# Patient Record
Sex: Male | Born: 1950
Health system: Southern US, Community
[De-identification: ages and names within clinical notes are randomized; demographics above are authoritative.]

## PROBLEM LIST (undated history)

## (undated) DIAGNOSIS — G4730 Sleep apnea, unspecified: Secondary | ICD-10-CM

## (undated) DIAGNOSIS — M0230 Reiter's disease, unspecified site: Secondary | ICD-10-CM

## (undated) DIAGNOSIS — G4700 Insomnia, unspecified: Secondary | ICD-10-CM

## (undated) DIAGNOSIS — N4611 Organic oligospermia: Secondary | ICD-10-CM

## (undated) DIAGNOSIS — G4733 Obstructive sleep apnea (adult) (pediatric): Secondary | ICD-10-CM

## (undated) DIAGNOSIS — T783XXA Angioneurotic edema, initial encounter: Secondary | ICD-10-CM

## (undated) DIAGNOSIS — L509 Urticaria, unspecified: Secondary | ICD-10-CM

## (undated) DIAGNOSIS — G540 Brachial plexus disorders: Secondary | ICD-10-CM

## (undated) DIAGNOSIS — R52 Pain, unspecified: Secondary | ICD-10-CM

## (undated) DIAGNOSIS — M1990 Unspecified osteoarthritis, unspecified site: Secondary | ICD-10-CM

## (undated) DIAGNOSIS — K219 Gastro-esophageal reflux disease without esophagitis: Secondary | ICD-10-CM

## (undated) DIAGNOSIS — T7840XA Allergy, unspecified, initial encounter: Secondary | ICD-10-CM

## (undated) DIAGNOSIS — N400 Enlarged prostate without lower urinary tract symptoms: Secondary | ICD-10-CM

## (undated) DIAGNOSIS — D126 Benign neoplasm of colon, unspecified: Secondary | ICD-10-CM

## (undated) DIAGNOSIS — M25461 Effusion, right knee: Secondary | ICD-10-CM

## (undated) DIAGNOSIS — L089 Local infection of the skin and subcutaneous tissue, unspecified: Secondary | ICD-10-CM

## (undated) DIAGNOSIS — Z9989 Dependence on other enabling machines and devices: Secondary | ICD-10-CM

## (undated) HISTORY — PX: SINOSCOPY: SHX187

## (undated) HISTORY — DX: Brachial plexus disorders: G54.0

## (undated) HISTORY — PX: ADENOIDECTOMY: SUR15

## (undated) HISTORY — DX: Benign neoplasm of colon, unspecified: D12.6

## (undated) HISTORY — DX: Gastro-esophageal reflux disease without esophagitis: K21.9

## (undated) HISTORY — DX: Urticaria, unspecified: L50.9

## (undated) HISTORY — DX: Allergy, unspecified, initial encounter: T78.40XA

## (undated) HISTORY — DX: Organic oligospermia: N46.11

## (undated) HISTORY — PX: TONSILLECTOMY: SUR1361

## (undated) HISTORY — PX: COLONOSCOPY: SHX174

## (undated) HISTORY — PX: FRACTURE SURGERY: SHX138

## (undated) HISTORY — DX: Angioneurotic edema, initial encounter: T78.3XXA

## (undated) HISTORY — DX: Insomnia, unspecified: G47.00

## (undated) HISTORY — PX: TOTAL SHOULDER REPLACEMENT: SUR1217

## (undated) HISTORY — PX: POLYPECTOMY: SHX149

## (undated) HISTORY — PX: JOINT REPLACEMENT: SHX530

---

## 1999-12-21 ENCOUNTER — Other Ambulatory Visit: Admission: RE | Admit: 1999-12-21 | Discharge: 1999-12-21 | Payer: Self-pay | Admitting: Orthopedic Surgery

## 2003-12-20 ENCOUNTER — Ambulatory Visit: Payer: Self-pay | Admitting: Internal Medicine

## 2004-04-18 ENCOUNTER — Ambulatory Visit: Payer: Self-pay | Admitting: Internal Medicine

## 2004-05-29 ENCOUNTER — Ambulatory Visit: Payer: Self-pay

## 2004-06-01 ENCOUNTER — Ambulatory Visit: Payer: Self-pay | Admitting: Internal Medicine

## 2005-01-09 ENCOUNTER — Ambulatory Visit: Payer: Self-pay | Admitting: Internal Medicine

## 2005-07-22 ENCOUNTER — Ambulatory Visit: Payer: Self-pay | Admitting: Internal Medicine

## 2006-01-07 HISTORY — PX: ULNAR NERVE REPAIR: SHX2594

## 2006-04-14 ENCOUNTER — Ambulatory Visit: Payer: Self-pay | Admitting: Internal Medicine

## 2006-04-14 LAB — CONVERTED CEMR LAB
ALT: 24 units/L (ref 0–40)
AST: 22 units/L (ref 0–37)
Albumin: 4 g/dL (ref 3.5–5.2)
Alkaline Phosphatase: 48 units/L (ref 39–117)
BUN: 29 mg/dL — ABNORMAL HIGH (ref 6–23)
Basophils Absolute: 0 10*3/uL (ref 0.0–0.1)
Basophils Relative: 0.3 % (ref 0.0–1.0)
Bilirubin Urine: NEGATIVE
Bilirubin, Direct: 0.1 mg/dL (ref 0.0–0.3)
CO2: 30 meq/L (ref 19–32)
Calcium: 9.4 mg/dL (ref 8.4–10.5)
Chloride: 108 meq/L (ref 96–112)
Cholesterol: 180 mg/dL (ref 0–200)
Creatinine, Ser: 1.3 mg/dL (ref 0.4–1.5)
Eosinophils Absolute: 0.3 10*3/uL (ref 0.0–0.6)
Eosinophils Relative: 5.6 % — ABNORMAL HIGH (ref 0.0–5.0)
GFR calc Af Amer: 73 mL/min
GFR calc non Af Amer: 60 mL/min
Glucose, Bld: 101 mg/dL — ABNORMAL HIGH (ref 70–99)
HCT: 44.1 % (ref 39.0–52.0)
HDL: 45.7 mg/dL (ref >39.0)
Hemoglobin, Urine: NEGATIVE
Hemoglobin: 15.1 g/dL (ref 13.0–17.0)
Ketones, ur: NEGATIVE mg/dL
LDL Cholesterol: 112 mg/dL — ABNORMAL HIGH (ref 0–99)
Leukocytes, UA: NEGATIVE
Lymphocytes Relative: 33.1 % (ref 12.0–46.0)
MCHC: 34.2 g/dL (ref 30.0–36.0)
MCV: 90.7 fL (ref 78.0–100.0)
Monocytes Absolute: 0.5 10*3/uL (ref 0.2–0.7)
Monocytes Relative: 10.1 % (ref 3.0–11.0)
Neutro Abs: 2.5 10*3/uL (ref 1.4–7.7)
Neutrophils Relative %: 50.9 % (ref 43.0–77.0)
Nitrite: NEGATIVE
PSA: 0.62 ng/mL (ref 0.10–4.00)
Platelets: 247 10*3/uL (ref 150–400)
Potassium: 4.7 meq/L (ref 3.5–5.1)
RBC: 4.86 M/uL (ref 4.22–5.81)
RDW: 11.9 % (ref 11.5–14.6)
Sodium: 142 meq/L (ref 135–145)
Specific Gravity, Urine: >=1.03 (ref 1.000–1.03)
TSH: 1.46 microintl units/mL (ref 0.35–5.50)
Total Bilirubin: 1 mg/dL (ref 0.3–1.2)
Total CHOL/HDL Ratio: 3.9
Total Protein, Urine: NEGATIVE mg/dL
Total Protein: 6.2 g/dL (ref 6.0–8.3)
Triglycerides: 113 mg/dL (ref 0–149)
Urine Glucose: NEGATIVE mg/dL
Urobilinogen, UA: 0.2 (ref 0.0–1.0)
VLDL: 23 mg/dL (ref 0–40)
WBC: 5 10*3/uL (ref 4.5–10.5)
pH: 5.5 (ref 5.0–8.0)

## 2006-04-16 ENCOUNTER — Ambulatory Visit: Payer: Self-pay | Admitting: Internal Medicine

## 2007-03-02 ENCOUNTER — Telehealth: Payer: Self-pay | Admitting: Internal Medicine

## 2007-05-14 ENCOUNTER — Telehealth: Payer: Self-pay | Admitting: Internal Medicine

## 2007-05-30 ENCOUNTER — Encounter: Payer: Self-pay | Admitting: *Deleted

## 2007-05-30 DIAGNOSIS — I499 Cardiac arrhythmia, unspecified: Secondary | ICD-10-CM | POA: Insufficient documentation

## 2007-05-30 DIAGNOSIS — D126 Benign neoplasm of colon, unspecified: Secondary | ICD-10-CM | POA: Insufficient documentation

## 2007-05-30 DIAGNOSIS — Z87898 Personal history of other specified conditions: Secondary | ICD-10-CM | POA: Insufficient documentation

## 2007-05-30 DIAGNOSIS — G4700 Insomnia, unspecified: Secondary | ICD-10-CM | POA: Insufficient documentation

## 2007-05-30 DIAGNOSIS — Z9889 Other specified postprocedural states: Secondary | ICD-10-CM | POA: Insufficient documentation

## 2007-07-28 ENCOUNTER — Telehealth: Payer: Self-pay | Admitting: Internal Medicine

## 2007-07-29 ENCOUNTER — Ambulatory Visit: Payer: Self-pay | Admitting: Internal Medicine

## 2007-07-29 DIAGNOSIS — R079 Chest pain, unspecified: Secondary | ICD-10-CM | POA: Insufficient documentation

## 2007-07-29 DIAGNOSIS — R0602 Shortness of breath: Secondary | ICD-10-CM | POA: Insufficient documentation

## 2007-07-30 ENCOUNTER — Ambulatory Visit: Payer: Self-pay | Admitting: Internal Medicine

## 2007-08-06 ENCOUNTER — Telehealth: Payer: Self-pay | Admitting: Internal Medicine

## 2007-08-10 ENCOUNTER — Telehealth: Payer: Self-pay | Admitting: Internal Medicine

## 2007-11-19 ENCOUNTER — Telehealth: Payer: Self-pay | Admitting: Internal Medicine

## 2007-11-19 DIAGNOSIS — R0989 Other specified symptoms and signs involving the circulatory and respiratory systems: Secondary | ICD-10-CM

## 2007-11-19 DIAGNOSIS — R0609 Other forms of dyspnea: Secondary | ICD-10-CM | POA: Insufficient documentation

## 2007-12-02 ENCOUNTER — Ambulatory Visit: Payer: Self-pay | Admitting: Pulmonary Disease

## 2007-12-04 ENCOUNTER — Telehealth: Payer: Self-pay | Admitting: Internal Medicine

## 2007-12-23 ENCOUNTER — Ambulatory Visit (HOSPITAL_BASED_OUTPATIENT_CLINIC_OR_DEPARTMENT_OTHER): Admission: RE | Admit: 2007-12-23 | Discharge: 2007-12-23 | Payer: Self-pay | Admitting: Pulmonary Disease

## 2007-12-23 ENCOUNTER — Encounter: Payer: Self-pay | Admitting: Pulmonary Disease

## 2008-01-19 ENCOUNTER — Ambulatory Visit: Payer: Self-pay | Admitting: Pulmonary Disease

## 2008-01-20 ENCOUNTER — Telehealth (INDEPENDENT_AMBULATORY_CARE_PROVIDER_SITE_OTHER): Payer: Self-pay | Admitting: *Deleted

## 2008-02-01 ENCOUNTER — Ambulatory Visit: Payer: Self-pay | Admitting: Pulmonary Disease

## 2008-02-01 DIAGNOSIS — G4733 Obstructive sleep apnea (adult) (pediatric): Secondary | ICD-10-CM | POA: Insufficient documentation

## 2008-02-04 ENCOUNTER — Telehealth (INDEPENDENT_AMBULATORY_CARE_PROVIDER_SITE_OTHER): Payer: Self-pay | Admitting: *Deleted

## 2008-02-15 ENCOUNTER — Ambulatory Visit: Payer: Self-pay | Admitting: Psychology

## 2008-02-17 ENCOUNTER — Encounter: Payer: Self-pay | Admitting: Internal Medicine

## 2008-02-24 ENCOUNTER — Encounter: Payer: Self-pay | Admitting: Pulmonary Disease

## 2008-03-07 ENCOUNTER — Ambulatory Visit: Payer: Self-pay | Admitting: Pulmonary Disease

## 2008-04-11 ENCOUNTER — Encounter: Payer: Self-pay | Admitting: Pulmonary Disease

## 2008-04-18 ENCOUNTER — Encounter: Payer: Self-pay | Admitting: Pulmonary Disease

## 2008-06-10 ENCOUNTER — Ambulatory Visit: Payer: Self-pay | Admitting: Psychology

## 2008-06-20 ENCOUNTER — Encounter: Payer: Self-pay | Admitting: Internal Medicine

## 2008-08-18 ENCOUNTER — Encounter: Payer: Self-pay | Admitting: Pulmonary Disease

## 2008-08-18 ENCOUNTER — Telehealth: Payer: Self-pay | Admitting: Pulmonary Disease

## 2008-08-29 ENCOUNTER — Ambulatory Visit: Payer: Self-pay | Admitting: Pulmonary Disease

## 2008-08-29 ENCOUNTER — Telehealth: Payer: Self-pay | Admitting: Internal Medicine

## 2008-09-09 ENCOUNTER — Encounter (INDEPENDENT_AMBULATORY_CARE_PROVIDER_SITE_OTHER): Payer: Self-pay | Admitting: *Deleted

## 2009-02-23 ENCOUNTER — Telehealth: Payer: Self-pay | Admitting: Internal Medicine

## 2009-02-24 ENCOUNTER — Encounter (INDEPENDENT_AMBULATORY_CARE_PROVIDER_SITE_OTHER): Payer: Self-pay | Admitting: *Deleted

## 2009-04-04 ENCOUNTER — Encounter (INDEPENDENT_AMBULATORY_CARE_PROVIDER_SITE_OTHER): Payer: Self-pay | Admitting: *Deleted

## 2009-04-12 ENCOUNTER — Ambulatory Visit: Payer: Self-pay | Admitting: Internal Medicine

## 2009-04-19 ENCOUNTER — Ambulatory Visit: Payer: Self-pay | Admitting: Internal Medicine

## 2009-04-20 ENCOUNTER — Encounter: Payer: Self-pay | Admitting: Internal Medicine

## 2009-06-09 ENCOUNTER — Telehealth: Payer: Self-pay | Admitting: Internal Medicine

## 2009-12-04 ENCOUNTER — Telehealth: Payer: Self-pay | Admitting: Internal Medicine

## 2009-12-13 ENCOUNTER — Encounter (INDEPENDENT_AMBULATORY_CARE_PROVIDER_SITE_OTHER): Payer: Self-pay | Admitting: *Deleted

## 2009-12-13 ENCOUNTER — Telehealth: Payer: Self-pay | Admitting: Internal Medicine

## 2009-12-28 ENCOUNTER — Encounter: Payer: Self-pay | Admitting: Internal Medicine

## 2010-02-04 LAB — CONVERTED CEMR LAB
ALT: 26 units/L (ref 0–53)
AST: 21 units/L (ref 0–37)
Albumin: 4.3 g/dL (ref 3.5–5.2)
Alkaline Phosphatase: 54 units/L (ref 39–117)
BUN: 23 mg/dL (ref 6–23)
Basophils Absolute: 0 10*3/uL (ref 0.0–0.1)
Basophils Relative: 0.1 % (ref 0.0–3.0)
Bilirubin, Direct: 0.1 mg/dL (ref 0.0–0.3)
CK-MB: 2.6 ng/mL (ref 0.3–4.0)
CO2: 29 meq/L (ref 19–32)
Calcium: 9.3 mg/dL (ref 8.4–10.5)
Chloride: 105 meq/L (ref 96–112)
Cholesterol: 191 mg/dL (ref 0–200)
Creatinine, Ser: 1.3 mg/dL (ref 0.4–1.5)
Eosinophils Absolute: 0.3 10*3/uL (ref 0.0–0.7)
Eosinophils Relative: 4 % (ref 0.0–5.0)
GFR calc Af Amer: 73 mL/min
GFR calc non Af Amer: 60 mL/min
Glucose, Bld: 97 mg/dL (ref 70–99)
HCT: 45.4 % (ref 39.0–52.0)
HDL: 49.3 mg/dL (ref >39.0)
Hemoglobin: 15.9 g/dL (ref 13.0–17.0)
LDL Cholesterol: 104 mg/dL — ABNORMAL HIGH (ref 0–99)
Lymphocytes Relative: 28 % (ref 12.0–46.0)
MCHC: 35.1 g/dL (ref 30.0–36.0)
MCV: 90.4 fL (ref 78.0–100.0)
Monocytes Absolute: 0.7 10*3/uL (ref 0.1–1.0)
Monocytes Relative: 10.5 % (ref 3.0–12.0)
Neutro Abs: 3.5 10*3/uL (ref 1.4–7.7)
Neutrophils Relative %: 57.4 % (ref 43.0–77.0)
Platelets: 236 10*3/uL (ref 150–400)
Potassium: 4.4 meq/L (ref 3.5–5.1)
RBC: 5.02 M/uL (ref 4.22–5.81)
RDW: 12.4 % (ref 11.5–14.6)
Sodium: 141 meq/L (ref 135–145)
TSH: 1.17 microintl units/mL (ref 0.35–5.50)
Total Bilirubin: 0.9 mg/dL (ref 0.3–1.2)
Total CHOL/HDL Ratio: 3.9
Total Protein: 6.9 g/dL (ref 6.0–8.3)
Triglycerides: 190 mg/dL — ABNORMAL HIGH (ref 0–149)
VLDL: 38 mg/dL (ref 0–40)
WBC: 6.3 10*3/uL (ref 4.5–10.5)

## 2010-02-06 NOTE — Progress Notes (Signed)
Summary: REFILL AMBIEN  Phone Note Call from Patient Call back at Home Phone [phone]   Caller: Patient/[phone] call Summary of Call:  per pt call returing  the nurse call reguarding his refill on ambein Initial call taken by: Shelbie Proctor,  May 14, 2007 9:51 AM  Follow-up for Phone Call        Pharm did not get refill's on 2/09 called in again Follow-up by: Lamar Sprinkles,  May 15, 2007 5:51 PM

## 2010-02-06 NOTE — Progress Notes (Signed)
Summary: RF  Phone Note Refill Request   Refills Requested: Medication #1:  ZOLPIDEM TARTRATE 10 MG  TABS 1/2 or 1 by mouth at hs prn. Initial call taken by: Lamar Sprinkles,  December 04, 2007 3:27 PM  Follow-up for Phone Call        [facility] 6 ref Follow-up by: Tresa Garter MD,  December 04, 2007 4:11 PM      Prescriptions: ZOLPIDEM TARTRATE 10 MG  TABS (ZOLPIDEM TARTRATE) 1/2 or 1 by mouth at hs prn  #30 x 6   Entered by:   Lamar Sprinkles   Authorized by:   Tresa Garter MD   Signed by:   Lamar Sprinkles on 12/04/2007   Method used:   Telephoned to ...       [facility] [address] [address] [phone]* (retail)       [address]       [place], [place]  [address]       Ph: [phone]       Fax: [phone]   RxID:   [id number]

## 2010-02-06 NOTE — Miscellaneous (Signed)
Summary: flomax  Clinical Lists Changes  Medications: Rx of FLOMAX 0.4 MG CP24 (TAMSULOSIN HCL) Take 1 tablet by mouth once a day;  #90 x 2;  Signed;  Entered by: Tora Perches;  Authorized by: Tresa Garter MD;  Method used: Electronically to [facility] [phone]*, [address] [place], [place], [place]  [address], Ph: [phone], Fax: [phone]    Prescriptions: FLOMAX 0.4 MG CP24 (TAMSULOSIN HCL) Take 1 tablet by mouth once a day  #90 x 2   Entered by:   Tora Perches   Authorized by:   Tresa Garter MD   Signed by:   Tora Perches on 02/17/2008   Method used:   Electronically to        [facility] [phone]* (retail)       [address]       [place], [place]  [address]       Ph: [phone]       Fax: [phone]   RxID:   [id number]

## 2010-02-06 NOTE — Miscellaneous (Signed)
Summary: poor compliance, mask leaks with auto  Clinical Lists Changes  Orders: Added new Referral order of DME Referral (DME) - Signed auto shows poor compliance, mask leaks.   Will work on these, and repeat auto.

## 2010-02-06 NOTE — Progress Notes (Signed)
Summary: Rx requests  Phone Note Call from Patient Call back at [phone]   Caller: Spouse Chip Boer) Reason for Call: Refill Medication Summary of Call: Patient's wife came into the office. Her husband is now getting his prescriptions from a mail order company [employer]) and she needs the perscriptions written out so she can send them in with the proper form and payments. This is for all current medications. Initial call taken by: Irma Newness,  August 29, 2008 11:45 AM  Follow-up for Phone Call        [facility] for refills on all meds?  Follow-up by: Lamar Sprinkles,  August 30, 2008 10:09 AM  Additional Follow-up for Phone Call Additional follow up Details #1::        OK 12 months on all. Thx Additional Follow-up by: Tresa Garter MD,  August 30, 2008 1:13 PM    Additional Follow-up for Phone Call Additional follow up Details #2::    Rx's ready, left mess to call office back to find out if rx's should be mailed or if pt wants to pick up. Credit card area is not signed. Form and rx's on triage A cart....................................Marland KitchenLamar Sprinkles, CMA  August 30, 2008 5:03 PM   Additional Follow-up for Phone Call Additional follow up Details #3:: Details for Additional Follow-up Action Taken: Wife to come pick up paper work. Additional Follow-up by: Beola Cord, CMA,  August 31, 2008 11:58 AM  Prescriptions: TRAZODONE HCL 50 MG  TABS (TRAZODONE HCL) At bedtime  #90 x 3   Entered by:   Orlan Leavens   Authorized by:   Tresa Garter MD   Signed by:   Orlan Leavens on 08/30/2008   Method used:   Print then Give to Patient   RxID:   [id number] ZOLPIDEM TARTRATE 10 MG  TABS (ZOLPIDEM TARTRATE) 1/2 or 1 by mouth at hs prn  #90 x 3   Entered by:   Orlan Leavens   Authorized by:   Tresa Garter MD   Signed by:   Orlan Leavens on 08/30/2008   Method used:   Print then Give to Patient   RxID:   [id number] FLOMAX 0.4 MG CP24 (TAMSULOSIN HCL) Take 1 tablet by mouth  once a day  #90 x 3   Entered by:   Orlan Leavens   Authorized by:   Tresa Garter MD   Signed by:   Orlan Leavens on 08/30/2008   Method used:   Print then Give to Patient   RxID:   [id number] AVODART 0.5 MG CAPS (DUTASTERIDE) Take 1 tablet by mouth once a day  #90 x 3   Entered by:   Orlan Leavens   Authorized by:   Tresa Garter MD   Signed by:   Orlan Leavens on 08/30/2008   Method used:   Print then Give to Patient   RxID:   [id number]

## 2010-02-06 NOTE — Progress Notes (Signed)
Summary: Ambien  Medications Added AVODART 0.5 MG CAPS (DUTASTERIDE)  FLOMAX 0.4 MG CP24 (TAMSULOSIN HCL)  ZOLPIDEM TARTRATE 10 MG  TABS (ZOLPIDEM TARTRATE) 1/2 or 1 by mouth at hs prn AMBIEN CR 12.5 MG  TBCR (ZOLPIDEM TARTRATE) 1 at bedtime as needed       Phone Note Call from Patient   Caller: [phone] Wife Ezequiel Essex OR wife [phone] Summary of Call: Patient is requesting new rx for generic ambien. He is currently taking Ambien CR Initial call taken by: Lamar Sprinkles,  March 02, 2007 3:12 PM  Follow-up for Phone Call        OK Follow-up by: Tresa Garter MD,  March 02, 2007 5:46 PM  Additional Follow-up for Phone Call Additional follow up Details #1::        SORRY, i got that backward, pt wants the CR b/c he wakes after 4 hours. OK to call in? Additional Follow-up by: Lamar Sprinkles,  March 02, 2007 6:20 PM    Additional Follow-up for Phone Call Additional follow up Details #2::    Ok Ambien CR 12.5 mg #30  6 REF Follow-up by: Tresa Garter MD,  March 03, 2007 1:27 PM  New/Updated Medications: AVODART 0.5 MG CAPS (DUTASTERIDE)  FLOMAX 0.4 MG CP24 (TAMSULOSIN HCL)  ZOLPIDEM TARTRATE 10 MG  TABS (ZOLPIDEM TARTRATE) 1/2 or 1 by mouth at hs prn AMBIEN CR 12.5 MG  TBCR (ZOLPIDEM TARTRATE) 1 at bedtime as needed   Prescriptions: AMBIEN CR 12.5 MG  TBCR (ZOLPIDEM TARTRATE) 1 at bedtime as needed  #30 x 6   Entered by:   Lamar Sprinkles   Authorized by:   Tresa Garter MD   Signed by:   Lamar Sprinkles on 03/03/2007   Method used:   Telephoned to ...       [facility] [address] [phone]*       [phone] [address]       [place], [place]  [address]       Ph: [phone]       Fax: [phone]   RxID:   [id number] ZOLPIDEM TARTRATE 10 MG  TABS (ZOLPIDEM TARTRATE) 1/2 or 1 by mouth at hs prn  #30 x 6   Entered and Authorized by:   Tresa Garter MD   Signed by:   Tresa Garter MD on 03/02/2007   Method used:    Print then Give to Patient   RxID:   [id number]

## 2010-02-06 NOTE — Miscellaneous (Signed)
Summary: patient returned cpap machine  Clinical Lists Changes  pt returned his cpap machine to dme, and did not want to use.  will call pt and see if he would like to come in and discuss other possible treatment options.

## 2010-02-06 NOTE — Progress Notes (Signed)
Summary: MED REFILL  Phone Note Call from Patient Call back at [phone]   Reason for Call: Talk to Nurse Summary of Call: NEEDS REFILL ON AVODART AND TRAMSULOSIN, PLEASE CALL INTO [facility] [address]..... NEEDS 1 MTH  WORTH TO LAST TILL APPT, GOING OUT OF TOWN NEED BY TOMORROW Initial call taken by: Migdalia Dk,  December 04, 2009 2:03 PM  Follow-up for Phone Call        Last office visit was 07/2007 - no pending apts Follow-up by: Lamar Sprinkles, CMA,  December 04, 2009 5:41 PM  Additional Follow-up for Phone Call Additional follow up Details #1::        ok to fill all Thank you!  Additional Follow-up by: Tresa Garter MD,  December 04, 2009 6:05 PM    Additional Follow-up for Phone Call Additional follow up Details #2::    Pt informed  Follow-up by: Lamar Sprinkles, CMA,  December 04, 2009 6:50 PM  Prescriptions: FLOMAX 0.4 MG CP24 (TAMSULOSIN HCL) Take 1 tablet by mouth once a day  #90 x 0   Entered by:   Lamar Sprinkles, CMA   Authorized by:   Tresa Garter MD   Signed by:   Lamar Sprinkles, CMA on 12/04/2009   Method used:   Electronically to        [facility] [phone]* (retail)       [address]       [place], [place]  [address]       Ph: [phone]       Fax: [phone]   RxID:   [id number] AVODART 0.5 MG CAPS (DUTASTERIDE) Take 1 tablet by mouth once a day  #90 x 0   Entered by:   Lamar Sprinkles, CMA   Authorized by:   Tresa Garter MD   Signed by:   Lamar Sprinkles, CMA on 12/04/2009   Method used:   Electronically to        [facility] [phone]* (retail)       [address]       [place], [place]  [address]       Ph: [phone]       Fax: [phone]   RxID:   [id number]

## 2010-02-06 NOTE — Assessment & Plan Note (Signed)
Summary: DIZZINESS/CHEST PAIN--$50-STC   Vital Signs:  Patient Profile:   60 Years Old Male Weight:      244 pounds Temp:     98.4 degrees F oral Pulse rate:   81 / minute BP sitting:   135 / 80  (left arm)  Vitals Entered By: Tora Perches (July 29, 2007 3:56 PM)                 Chief Complaint:  Multiple medical problems or concerns.  History of Present Illness: C/o CP first time pain in chest behind sternum heavy x 2 h on Thur,. Next episode on the plane. On Sun had a lightheaded spell when was  cycling  8 mi x 10 -15 min. Stressed out. Shoulders and back were achy while riding a bike. Today went to a spin cycling class: he gave it an 80% effort. There was no CP or unusual SOB.     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         As above   Physical Exam  General:     NAD Head:     Normocephalic and atraumatic without obvious abnormalities. No apparent alopecia or balding. Nose:     External nasal examination shows no deformity or inflammation. Nasal mucosa are pink and moist without lesions or exudates. Mouth:     Oral mucosa and oropharynx without lesions or exudates.  Teeth in good repair. Neck:     No deformities, masses, or  tenderness noted. Lungs:     Normal respiratory effort, chest expands symmetrically. Lungs are clear to auscultation, no crackles or wheezes. Heart:     Normal rate and regular rhythm. S1 and S2 normal without gallop, murmur, click, rub or other extra sounds. Abdomen:     Bowel sounds positive,abdomen soft and non-tender without masses, organomegaly or hernias noted. Msk:     No deformity or scoliosis noted of thoracic or lumbar spine.   Pulses:     R and L carotid,radial,femoral,dorsalis pedis and posterior tibial pulses are full and equal bilaterally Extremities:     No clubbing, cyanosis, edema, or deformity noted with normal full range of motion of all joints.   Skin:     Intact without suspicious lesions or rashes Psych:     Cognition and judgment appear intact. Alert and cooperative with normal attention span and concentration. No apparent delusions, illusions, hallucinations    Impression & Recommendations:  Problem # 1:  CHEST PAIN, UNSPECIFIED (  ICD-786.50) Assessment: New R/o PE, CAD Orders: EKG w/ Interpretation (93000) Radiology Referral (Radiology) Cardiology Referral (Cardiology) TLB-BMP (Basic Metabolic Panel-BMET) (80048-METABOL) TLB-CBC Platelet - w/Differential (85025-CBCD) TLB-Hepatic/Liver Function Pnl (80076-HEPATIC) TLB-TSH (Thyroid Stimulating Hormone) (84443-TSH) TLB-Lipid Panel (80061-LIPID) TLB-CK-MB (Creatine Kinase MB) (82553-CKMB)   Problem # 2:  DYSPNEA (ICD-786.05) related to #1 Assessment: New To ER if problems Orders: Radiology Referral (Radiology) Cardiology Referral (Cardiology)   Problem # 3:  Probable GERD/ esoph. spasm Assessment: New Given Rx  Complete Medication List: 1)  Avodart 0.5 Mg Caps (Dutasteride) 2)  Flomax 0.4 Mg Cp24 (Tamsulosin hcl) .... Take 1 tablet by mouth once a day 3)  Zolpidem Tartrate 10 Mg Tabs (Zolpidem tartrate) .... 1/2 or 1 by mouth at hs prn 4)  Ambien Cr 12.5 Mg Tbcr (Zolpidem tartrate) .[name].. 1 at  bedtime as needed 5)  Aspirin 81 Mg Tbec (Aspirin) .... One by mouth every day   Patient Instructions: 1)  Please schedule a follow-up appointment in 2 weeks. 2)  Aciphex one a day 3)  To ER if problems   ]

## 2010-02-06 NOTE — Letter (Signed)
Summary: [facility] Instructions  [facility]  [address] [place], [place] [address]   Phone: [phone]  Fax: [phone]       Dak TINKEY    06/23/1950    MRN: [id number]        Procedure Day Dorna Bloom: Wednesday 04/19/2009     Arrival Time: 8:00 am      Procedure Time: 9:00 am     Location of Procedure:                    _x _  [facility] (4th Floor)                        PREPARATION FOR COLONOSCOPY WITH MOVIPREP   Starting 5 days prior to your procedure Friday 4/8 do not eat nuts, seeds, popcorn, corn, beans, peas,  salads, or any raw vegetables.  Do not take any fiber supplements (e.g. Metamucil, Citrucel, and Benefiber).  THE DAY BEFORE YOUR PROCEDURE         DATE: Tuesday 4/12  1.  Drink clear liquids the entire day-NO SOLID FOOD  2.  Do not drink anything colored red or purple.  Avoid juices with pulp.  No orange juice.  3.  Drink at least 64 oz. (8 glasses) of fluid/clear liquids during the day to prevent dehydration and help the prep work efficiently.  CLEAR LIQUIDS INCLUDE: Water Jello Ice Popsicles Tea (sugar ok, no milk/cream) Powdered fruit flavored drinks Coffee (sugar ok, no milk/cream) Gatorade Juice: apple, white grape, white cranberry  Lemonade Clear bullion, consomm, broth Carbonated beverages (any kind) Strained chicken noodle soup Hard Candy                             4.  In the morning, mix first dose of MoviPrep solution:    Empty 1 Pouch A and 1 Pouch B into the disposable container    Add lukewarm drinking water to the top line of the container. Mix to dissolve    Refrigerate (mixed solution should be used within 24 hrs)  5.  Begin drinking the prep at 5:00 p.m. The MoviPrep container is divided by 4 marks.   Every 15 minutes drink the solution down to the next mark (approximately 8 oz) until the full liter is complete.   6.  Follow completed prep with 16 oz of clear liquid of your choice (Nothing  red or purple).  Continue to drink clear liquids until bedtime.  7.  Before going to bed, mix second dose of MoviPrep solution:    Empty 1 Pouch A and 1 Pouch B into the disposable container    Add lukewarm drinking water to the top line of the container. Mix to dissolve    Refrigerate  THE DAY OF YOUR PROCEDURE      DATE: Wednesday 4/13  Beginning at 4:00 a.m. (5 hours before procedure):         1. Every 15 minutes, drink the solution down to the next mark (approx 8 oz) until the full liter is complete.  2. Follow completed prep with 16 oz. of clear liquid of your choice.    3. You may drink clear liquids until 7:00 am  (2 HOURS BEFORE PROCEDURE).   MEDICATION INSTRUCTIONS  Unless otherwise instructed, you should take regular prescription medications with a small sip of water   as early as possible the morning of  your procedure.          OTHER INSTRUCTIONS  You will need a responsible adult at least 62 years of age to accompany you and drive you home.   This person must remain in the waiting room during your procedure.  Wear loose fitting clothing that is easily removed.  Leave jewelry and other valuables at home.  However, you may wish to bring a book to read or  an iPod/MP3 player to listen to music as you wait for your procedure to start.  Remove all body piercing jewelry and leave at home.  Total time from sign-in until discharge is approximately 2-3 hours.  You should go home directly after your procedure and rest.  You can resume normal activities the  day after your procedure.  The day of your procedure you should not:   Drive   Make legal decisions   Operate machinery   Drink alcohol   Return to work  You will receive specific instructions about eating, activities and medications before you leave.    The above instructions have been reviewed and explained to me by  Wyona Almas RN  April 12, 2009 4:58 PM     I fully understand and can  verbalize these instructions _____________________________ Date _________

## 2010-02-06 NOTE — Progress Notes (Signed)
Summary: REFERRAL  Phone Note Call from Patient   Caller: [phone] Summary of Call: Patient is requesting referral for a sleep study. Says he snores.  Initial call taken by: Lamar Sprinkles,  November 19, 2007 3:54 PM  Follow-up for Phone Call        OK Follow-up by: Tresa Garter MD,  November 19, 2007 11:59 PM  New Problems: SNORING (ICD-786.09)   New Problems: SNORING (ICD-786.09)

## 2010-02-06 NOTE — Letter (Signed)
Summary: External Correspondence  External Correspondence   Imported By: Valinda Hoar 04/11/2008 16:11:54  _____________________________________________________________________  External Attachment:    Type:   Image     Comment:   External Document

## 2010-02-06 NOTE — Progress Notes (Signed)
Summary: Needs OV with PCP  ---- Converted from flag ---- ---- 12/13/2009 10:54 AM, Verdell Face wrote:  mailed letter to pt..  ---- 11/24/2009 5:04 PM, Lanier Prude, CMA(AAMA) wrote: Please sched pt OV with PCP if still desires to do so.  Last seen 2009    Thanks! ------------------------------

## 2010-02-06 NOTE — Progress Notes (Signed)
Summary: pt returned CPAP to [facility]  Phone Note From Other Clinic Call back at [phone]   Caller: Buford Dresser with [facility] Summary of Call: [phone] Buford Dresser with [facility] called and stated that pt returned his CPAP today. He stated that he was not using CPAP and insurance company has applied everything to his deductible. Mardelle Matte has download CPAP and pt has NOT been using machine. Just wanted you to be aware.   Initial call taken by: Alfonso Ramus,  August 18, 2008 9:14 AM  Follow-up for Phone Call        see if pt would like to come and discuss other possible treatment options with me for his sleep apnea. Follow-up by: Barbaraann Share MD,  August 18, 2008 9:55 AM  Additional Follow-up for Phone Call Additional follow up Details #1::        [facility] for pt to return my call. 08/18/08 at 10:57 am.[facility] for pt to return my call on 08/22/08 at 4:31 pm. Alfonso Ramus  August 22, 2008 4:35 PM  Pt returned my call and appt was scheduled for 08/29/08 at 9:45. Rhonda Cobb  August 23, 2008 9:29 AM

## 2010-02-06 NOTE — Miscellaneous (Signed)
Summary: [facility] Order for CPAP Equipment/[facility]  [facility] Order for CPAP Equipment/[facility]   Imported By: Esmeralda Links D'jimraou 02/26/2008 13:53:01  _____________________________________________________________________  External Attachment:    Type:   Image     Comment:   External Document

## 2010-02-06 NOTE — Progress Notes (Signed)
Summary: Stress Test  Phone Note Call from Patient Call back at Home Phone [phone]   Caller: [phone] Summary of Call: Pt says that since CT was clear, pt has gone back to 1 hr bike cardio daily, rode 100 k at 135 hr this weekend. Pt does not think he needs stress test. Says he did not know about it in the first place.  Initial call taken by: Lamar Sprinkles,  August 10, 2007 3:45 PM  Follow-up for Phone Call        [facility] not to reschedule the stress test then Follow-up by: Tresa Garter MD,  August 10, 2007 5:12 PM  Additional Follow-up for Phone Call Additional follow up Details #1::        Pt informed  Additional Follow-up by: Lamar Sprinkles,  August 10, 2007 5:23 PM

## 2010-02-06 NOTE — Procedures (Signed)
Summary: Colonoscopy  Patient: Braulio Kiedrowski Note: All result statuses are Final unless otherwise noted.  Tests: (1) Colonoscopy (COL)   COL Colonoscopy           DONE     [facility]     [address]     [place], [place]  [address]           COLONOSCOPY PROCEDURE REPORT           PATIENT:  Faysal, Fenoglio  MR#:  [id number]     BIRTHDATE:  [birth year], 58 yrs. old  GENDER:  male     ENDOSCOPIST:  Wilhemina Bonito. Eda Keys, MD     REF. BY:  Surveillance Program Recall,     PROCEDURE DATE:  04/19/2009     PROCEDURE:  Colonoscopy with snare polypectomy x 4     ASA CLASS:  Class II     INDICATIONS:  history of hyperplastic polyps ; INDEX 09-2003 (mult.     transverse)     MEDICATIONS:   Fentanyl 100 mcg IV, Versed 10 mg IV, Benadryl 25     mg IV           DESCRIPTION OF PROCEDURE:   After the risks benefits and     alternatives of the procedure were thoroughly explained, informed     consent was obtained.  Digital rectal exam was performed and     revealed no abnormalities.   The LB [id number] endoscope     was introduced through the anus and advanced to the cecum, which     was identified by both the appendix and ileocecal valve, without     limitations. Time to cecum = 6:28 min. The quality of the prep was     good, using MoviPrep.  The instrument was then slowly withdrawn     (time = 15:50min) as the colon was fully examined.     <<PROCEDUREIMAGES>>           FINDINGS:  Four polyps were found: 2mm cecal and 10mm,4mm,5mm in     the distal transverse colon. Polyps were snared without cautery.     Retrieval was successful.   This was otherwise a normal     examination of the colon.   Retroflexed views in the rectum     revealed no abnormalities.    The scope was then withdrawn from     the patient and the procedure completed.           COMPLICATIONS:  None     ENDOSCOPIC IMPRESSION:     1) Four polyps - removed     2) Otherwise normal examination        RECOMMENDATIONS:     1) Follow up colonoscopy in 3 years if 3 or more polyps     adenomatous; otherwise 5 years           ______________________________     Wilhemina Bonito. Eda Keys, MD           CC:  Linda Hedges. Plotnikov, MD; The Patient           n.     eSIGNED:   Wilhemina Bonito. Eda Keys at 04/19/2009 10:05 AM           Shatto, Keith[place], Dileonardo [phone]  Note: An exclamation mark (!) indicates a result that was not dispersed into the flowsheet. Document Creation Date: 04/19/2009 10:06 AM _______________________________________________________________________  (1) Order result status: Final Collection or observation date-time:  04/19/2009 09:53 Requested date-time:  Receipt date-time:  Reported date-time:  Referring Physician:   Ordering Physician: Fransico Setters [id number]) Specimen Source:  Source: Launa Grill Order Number: [id number] Lab site:   Appended Document: Colonoscopy     Procedures Next Due Date:    Colonoscopy: 04/2012

## 2010-02-06 NOTE — Assessment & Plan Note (Signed)
Summary: rov sleep study results/mbw   Referred by:  Plotnikov PCP:  Tresa Garter MD  Chief Complaint:  Chris Obrien here to go over sleep study.  History of Present Illness: the Chris Obrien comes in today for f/u after his recent sleep study.  He was found to have moderate to severe osa with an AHI of 35/hr, and a RDI of 51/hr.  He had oxygen desaturation as low as 83%.  I have gone over the study in detail with him, and answered all of his questions.     Prior Medications Reviewed Using: Patient Recall  Prior Medication List:  AVODART 0.5 MG CAPS (DUTASTERIDE) Take 1 tablet by mouth once a day FLOMAX 0.4 MG CP24 (TAMSULOSIN HCL) Take 1 tablet by mouth once a day ZOLPIDEM TARTRATE 10 MG  TABS (ZOLPIDEM TARTRATE) 1/2 or 1 by mouth at hs prn   Current Allergies: No known allergies       Vital Signs:  Patient Profile:   60 Years Old Male Weight:      250.8 pounds O2 Sat:      96 % O2 treatment:    Room Air Temp:     98.0 degrees F oral Pulse rate:   81 / minute BP sitting:   128 / 82  (left arm) Cuff size:   regular  Vitals Entered By: Arman Filter LPN (February 01, 2008 11:57 AM)                 Physical Exam  General:     ow male in nad      Impression & Recommendations:  Problem # 1:  OBSTRUCTIVE SLEEP APNEA (ICD-327.23) the Chris Obrien has moderate to severe osa by his sleep study.  Given his symptoms and the severity of his osa, I have recommended a trial of cpap while working on weight loss.  He is agreeable to trying this.   Patient Instructions: 1)  will try on cpap 2)  work on weight loss 3)  f/u with me in 4wks., but call if tolerance issues.

## 2010-02-06 NOTE — Progress Notes (Signed)
Summary: Stress Test  Phone Note From Other Clinic   Summary of Call: [facility] ext [phone] called, pt did not show for stress test today. Pt told them he was not sure if Dr wanted pt to have this done still. Need to inform heartcare.  Initial call taken by: Lamar Sprinkles,  August 06, 2007 3:28 PM  Follow-up for Phone Call        Yes, I wanted him to have the test Follow-up by: Tresa Garter MD,  August 10, 2007 1:15 PM  Additional Follow-up for Phone Call Additional follow up Details #1::        heartcare informed, lf mess for pt Additional Follow-up by: Lamar Sprinkles,  August 10, 2007 2:46 PM

## 2010-02-06 NOTE — Progress Notes (Signed)
Summary: dixxiness  Phone Note Call from Patient   Caller: Patient Summary of Call: Patient called c/o dizzy spell on 07/26/07, he had some pin pick type of pain under his arm/chest (lasting3-4sec) he states not other symptoms. Appt schedule for 07/29/07 and told to go to UC/ER is it gets worse. Initial call taken by: Rock Nephew CMA,  July 28, 2007 10:26 AM

## 2010-02-06 NOTE — Letter (Signed)
Summary: [facility]  [facility]   Imported By: Sherian Rein 07/05/2008 10:50:05  _____________________________________________________________________  External Attachment:    Type:   Image     Comment:   External Document

## 2010-02-06 NOTE — Letter (Signed)
Summary: External Correspondence  External Correspondence   Imported By: Valinda Hoar 04/11/2008 16:14:27  _____________________________________________________________________  External Attachment:    Type:   Image     Comment:   External Document

## 2010-02-06 NOTE — Letter (Signed)
Summary: Recall Colonoscopy Letter  [facility]  [address] [place], [place] [address]   Phone: [phone]  Fax: [phone]      September 09, 2008 MRN: [id number]   Chris Obrien [address] [place], [place]  [address]   Dear [name],   According to your medical record, it is time for you to schedule a Colonoscopy. The American Cancer Society recommends this procedure as a method to detect early colon cancer. Patients with a family history of colon cancer, or a personal history of colon polyps or inflammatory bowel disease are at increased risk.  This letter has beeen generated based on the recommendations made at the time of your procedure. If you feel that in your particular situation this may no longer apply, please contact our office.  Please call our office at [phone] to schedule this appointment or to update your records at your earliest convenience.  Thank you for cooperating with [place] to provide you with the very best care possible.   Sincerely,  Wilhemina Bonito. Marina Goodell, M.D.  [facility] [phone]

## 2010-02-06 NOTE — Progress Notes (Signed)
Summary: Schedule Colonoscopy  Phone Note Outgoing Call   Call placed by: Hortense Ramal CMA Duncan Dull),  February 23, 2009 3:27 PM Call placed to: Patient Summary of Call: Patient is due for a colonoscopy due to his previous history of hyperplastic colonic polyps. I attempted to contact patient, however he was unavailable. His wife states that she will have the patient call back to discuss. Initial call taken by: Hortense Ramal CMA Duncan Dull),  February 23, 2009 3:28 PM  Follow-up for Phone Call        Patient has scheduled a previsit and colonoscopy date and time. Follow-up by: Hortense Ramal CMA Duncan Dull),  February 28, 2009 10:05 AM

## 2010-02-06 NOTE — Assessment & Plan Note (Signed)
Summary: rov for osa   Referred by:  Plotnikov PCP:  Tresa Garter MD  Chief Complaint:  Pt is here for a routine f/u appt on his cpap machine.  Pt states he is using his cpap machine "almost every night. "  Approx 6 hours per night .  History of Present Illness: the pt comes in today for f/u after initiating cpap.  He has adjusted to the mask, and his wife has noted no further snoring.  He is sleeping a little better, but doesn't feel that he has gotten full therapeutic benefit from the device.  I have reminded him that his pressure has not been optimized yet.    Current Allergies: No known allergies   Review of Systems      See HPI  Vital Signs:  Patient Profile:   60 Years Old Male Weight:      247 pounds O2 Sat:      96 % O2 treatment:    Room Air Temp:     98.0 degrees F oral Pulse rate:   72 / minute BP sitting:   118 / 78  (left arm) Cuff size:   regular  Vitals Entered By: Arman Filter LPN (March 08, 8467 10:41 AM)             Comments Medications reviewed with patient Arman Filter LPN  March 07, 6293 10:41 AM     Physical Exam  General:     ow male in nad Nose:     no skin breakdown or pressure necrosis from the cpap mask   Impression & Recommendations:  Problem # 1:  OBSTRUCTIVE SLEEP APNEA (ICD-327.23) the pt is tolerating cpap well, but has not seen full benefit as of yet.  We need to convert his machine to the auto mode for 2 weeks to optimize his pressure for him.  I would expect him to do better once this is done.  I will let him know his optimal pressure.  I have also encouraged him to work on weight loss.  Patient Instructions: 1)  will put on auto mode to optimize your pressure.  Will let you know the results 2)  f/u with me in 6mos or sooner if problems.

## 2010-02-06 NOTE — Miscellaneous (Signed)
Summary: [facility] Previsit/prep  Clinical Lists Changes  Medications: Added new medication of MOVIPREP 100 GM  SOLR (PEG-KCL-NACL-NASULF-NA ASC-C) As per prep instructions. - Signed Rx of MOVIPREP 100 GM  SOLR (PEG-KCL-NACL-NASULF-NA ASC-C) As per prep instructions.;  #1 x 0;  Signed;  Entered by: Wyona Almas RN;  Authorized by: Hilarie Fredrickson MD;  Method used: Electronically to [facility]. [phone]*, [address], [place], [place], [place]  [address], Ph: [phone], Fax: [phone] Observations: Added new observation of NKA: T (04/12/2009 16:24)    Prescriptions: MOVIPREP 100 GM  SOLR (PEG-KCL-NACL-NASULF-NA ASC-C) As per prep instructions.  #1 x 0   Entered by:   Wyona Almas RN   Authorized by:   Hilarie Fredrickson MD   Signed by:   Wyona Almas RN on 04/12/2009   Method used:   Electronically to        [facility]. [phone]* (retail)       [address]       [place], [place]  [address]       Ph: [phone]       Fax: [phone]   RxID:   [id number]

## 2010-02-06 NOTE — Letter (Signed)
Summary: Patient Notice- Polyp Results  [facility]  [address] [place], [place] [address]   Phone: [phone]  Fax: [phone]        April 20, 2009 MRN: [id number]    Chris Obrien [address] [place], [place]  [address]    Dear [name],  I am pleased to inform you that the colon polyp(s) removed during your recent colonoscopy was (were) found to be benign (no cancer detected) upon pathologic examination.  I recommend you have a repeat colonoscopy examination in 3 years to look for recurrent polyps, as having colon polyps increases your risk for having recurrent polyps or even colon cancer in the future.  Should you develop new or worsening symptoms of abdominal pain, bowel habit changes or bleeding from the rectum or bowels, please schedule an evaluation with either your primary care physician or with me.  Additional information/recommendations:  __ No further action with gastroenterology is needed at this time. Please      follow-up with your primary care physician for your other healthcare      needs.  Please call us if you are having persistent problems or have questions about your condition that have not been fully answered at this time.  Sincerely,  Hilarie Fredrickson MD  This letter has been electronically signed by your physician.  Appended Document: Patient Notice- Polyp Results letter mailed 4.18.11

## 2010-02-06 NOTE — Letter (Signed)
Summary: Primary Care Appointment Letter  [facility]  [address] [place], [place] [address]   Phone: [phone]  Fax: [phone]    12/13/2009 MRN: [id number]  Clee Fetsch [address] [place], [place]  [address]  Dear Mr. Verrastro,   Your Primary Care Physician Tresa Garter MD has indicated that:    ____XX___it is time to schedule an appointment.    _______you missed your appointment on______ and need to call and          reschedule.    _______you need to have lab work done.    _______you need to schedule an appointment discuss lab or test results.    _______you need to call to reschedule your appointment that is                       scheduled on _________.     Please call our office as soon as possible. Our phone number is [phone] option #1. Please press option 1. Our office is open 8a-12noon and 1p-5p, Monday through Friday.     Thank you,    [facility]

## 2010-02-06 NOTE — Progress Notes (Signed)
Summary: pt needs ov to discuss sleep study results LMTCB  ---- Converted from flag ---- ---- 01/20/2008 10:46 AM, Arman Filter LPN wrote: pt needs ov with [facility] to discuss sleep study results. ------------------------------  LMOMTCBx1.   Aundra Millet Deyonna Fitzsimmons LPN  January 20, 2008 11:03 AM   pt is scheduled to see [facility] jan 25 at 12:00pm.  Arman Filter LPN  January 25, 2008 12:15 PM

## 2010-02-06 NOTE — Progress Notes (Signed)
Summary: c pap  Phone Note Call from Patient   Caller: Patient Call For: clance Summary of Call: pt have not heard about c pap machine that is suppose to be coming from [facility] Initial call taken by: Rickard Patience,  February 04, 2008 9:11 AM  Follow-up for Phone Call        Pt says he has not heard from [facility] regarding his new CPAP machine. Can we check on this and [please give the pt a call back at his cell number, [phone]. Thanks. Michel Bickers [facility]  February 04, 2008 9:44 AM    left message for pt that [facility] usually calls within a few days today only being the 3rd day i will call and have someone call him asap/spoke to [email or web address]  she will have someone call him [phone] pt aware

## 2010-02-06 NOTE — Letter (Signed)
Summary: Previsit letter  [facility]  [address] [place], [place] [address]   Phone: [phone]  Fax: [phone]       02/24/2009 MRN: [id number]  Chris Obrien [address] [place], [place]  [address]  Dear [name],  Welcome to the Gastroenterology Division at [facility].    You are scheduled to see a nurse for your pre-procedure visit on 04-05-09 at 8:00a.m. on the 3rd floor at [facility], [address]  We ask that you try to arrive at our office 15 minutes prior to your appointment time to allow for check-in.  Your nurse visit will consist of discussing your medical and surgical history, your immediate family medical history, and your medications.    Please bring a complete list of all your medications or, if you prefer, bring the medication bottles and we will list them.  We will need to be aware of both prescribed and over the counter drugs.  We will need to know exact dosage information as well.  If you are on blood thinners (Coumadin, Plavix, Aggrenox, Ticlid, etc.) please call our office today/prior to your appointment, as we need to consult with your physician about holding your medication.   Please be prepared to read and sign documents such as consent forms, a financial agreement, and acknowledgement forms.  If necessary, and with your consent, a friend or relative is welcome to sit-in on the nurse visit with you.  Please bring your insurance card so that we may make a copy of it.  If your insurance requires a referral to see a specialist, please bring your referral form from your primary care physician.  No co-pay is required for this nurse visit.     If you cannot keep your appointment, please call [phone] to cancel or reschedule prior to your appointment date.  This allows [place] the opportunity to schedule an appointment for another patient in need of care.    Thank you for choosing [facility] for your medical  needs.  We appreciate the opportunity to care for you.  Please visit [place] at our website  to learn more about our practice.                     Sincerely.                                                                                                                   The Gastroenterology Division

## 2010-02-06 NOTE — Assessment & Plan Note (Signed)
Summary: rov for osa with failed cpap treatment.   Copy to:  Plotnikov Primary Provider/Referring Provider:  Tresa Garter MD  CC:  Pt is here for a f/u appt.  Pt states he returned cpap machine back to DME company and would like to discuss other options to treat OSA.[name]  History of Present Illness: The pt comes in today for f/u of his severe osa.  He was unable to tolerate cpap, and returned the machine to the dme.  He comes in today for discussion of other options.  He feels that ambien at hs while he is trying to work on weight loss will be adequate, but I have explained to him that sedative hypnotics worsen osa.  He is having fragmented and nonrestorative sleep.  Medications Prior to Update: 1)  Avodart 0.5 Mg Caps (Dutasteride) .... Take 1 Tablet By Mouth Once A Day 2)  Flomax 0.4 Mg Cp24 (Tamsulosin Hcl) .... Take 1 Tablet By Mouth Once A Day 3)  Zolpidem Tartrate 10 Mg  Tabs (Zolpidem Tartrate) .... 1/2 or 1 By Mouth At [facility] Prn  Allergies (verified): No Known Drug Allergies  Review of Systems      See HPI  Vital Signs:  Patient profile:   60 year old male Height:      73 inches Weight:      248 pounds BMI:     32.84 O2 Sat:      96 % on Room air Temp:     98.4 degrees F oral Pulse rate:   80 / minute BP sitting:   128 / 80  (left arm) Cuff size:   large  Vitals Entered By: Arman Filter LPN (August 29, 2008 9:43 AM)  O2 Flow:  Room air CC: Pt is here for a f/u appt.  Pt states he returned cpap machine back to DME company and would like to discuss other options to treat OSA. Comments Medications reviewed with patient Arman Filter LPN  August 29, 2008 9:43 AM    Physical Exam  General:  ow male in nad Nose:  no skin breakdown or pressure necrosis from prior use of cpap mask   Impression & Recommendations:  Problem # 1:  OBSTRUCTIVE SLEEP APNEA (ICD-327.23)  The pt was unable to tolerate cpap, and recently turned the machine back in.  He continues to have  fragmented sleep, and is taking ambien to help with this.  I cautioned him about taking benzodiazepine like drugs with severe osa, and that they also have the disadvantage of building tolerance and addiction.  I would not give him this medication for his sleep given the severity of his untreated sleep apnea.  I would consider trazodone short term, and have also discussed with him behavioral modifications as well.  I have also discussed with him alternative treatments for his osa.  Surgery is not a good option due to his minimally abnormal anatomy.  I think aggressive weight loss combined with dental appliance would be very helpful.  I have given him a brochure about dental appliances, and he will call me if he would like to consider a referral.  I have also explained to him that his degree of sleep apnea will not be cured by a dental appliance alone, but can help considerably while he is trying to lose weight.  Medications Added to Medication List This Visit: 1)  Trazodone Hcl 50 Mg Tabs (Trazodone hcl) .... At bedtime  Other Orders: Est. Patient Level III ([address])  Patient  Instructions: 1)  will try trazodone 50mg  at bedtime in the place of ambien. 2)  work on weight loss 3)  consider dental appliance  Prescriptions: TRAZODONE HCL 50 MG  TABS (TRAZODONE HCL) At bedtime  #30 x 1   Entered and Authorized by:   Barbaraann Share MD   Signed by:   Barbaraann Share MD on 08/29/2008   Method used:   Print then Give to Patient   RxID:   [id number]

## 2010-02-06 NOTE — Assessment & Plan Note (Signed)
Summary: consult for osa   Referred by:  Plotnikov PCP:  Tresa Garter MD  Chief Complaint:  Sleep Consult.  History of Present Illness: the pt is a 60 y/o male who is being referred for possible sleep apnea.  He has been noted to have snoring and snorting according to his wife, as well as pauses in his breathing during sleep. He typically goes to bed between 8 and 10 PM, and has had long-standing difficulty with sleep onset insomnia for years. He has taken Ambien chronically at night for at least 5 years. The patient has at least 3-4 awakenings a night for unknown reasons. He does not feel that he has difficulties getting back to sleep, but is frustrated by the frequent awakenings. He denies any choking arousals or leg kicking during the night.  He typically starts his day at 6:30 AM, and feels that he is adequately rested upon arising. He has noted intermittent sleep pressure during the day with periods of inactivity, but doesn't believe that his alertness or concentration are being affected. He has no issues with sleepiness while driving. He does admit to taking "power naps" in the early evenings, but he has tried to avoid that of late. The patient states that his weight is up 10 pounds over the last 2 years. His Epworth score today is only 6.     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       Never Smoked       Regular exercise-yes   Risk Factors: Tobacco use:  never Exercise:  yes  Colonoscopy History:    Date of Last Colonoscopy:  10/06/2003   Review of Systems      See HPI   Vital Signs:  Patient Profile:   60 Years Old Male Weight:      250.25 pounds O2 Sat:      96 % O2 treatment:    Room Air Temp:     98.3 degrees F oral Pulse rate:   83 / minute BP sitting:   126 / 78  (left arm) Cuff size:   regular  Vitals Entered By: Cyndia Diver LPN (December 02, 2007 11:39 AM)             Is Patient Diabetic? No Comments Medications reviewed with patient Cyndia Diver LPN  December 02, 2007 11:39 AM      Physical Exam  General:     overweight male in no acute distress Eyes:     PERRLA and EOMI.   Nose:     mild  septal deviation to the right but otherwise patent Mouth:     mild elongation of the soft palate and uvula Neck:     no JVD, thyromegaly, or lymphadenopathy. Lungs:     clear to auscultation Heart:     regular rate and rhythm, no MRG Abdomen:     soft and nontender, bowel sounds present Extremities:     no significant edema, pulses intact distally Neurologic:     alert and oriented, moves all 4 extremities.      Impression & Recommendations:  Problem # 1:  SNORING (ICD-786.09) the patient has a history of snoring as well as pauses in his breathing during sleep according to his wife. She is very concerned about this, and has asked him to get this evaluated. The patient also is having frequent awakenings during the night of unknown etiology, and this certainly could be from obstructive sleep apnea. He denies any history consistent with RLS. At this point, if the patient wishes to get to the bottom of his awakenings, we should do a sleep study. This will put the issue to rest regarding sleep apnea, and also give reassurance to  his wife.  Problem # 2:  INSOMNIA (ICD-780.52) the patient describes sleep onset insomnia, not sleep maintenance issues. He obviously has a stressful job and lifestyle, and this is contributing to his sleep onset issues. I have concerns about his ongoing Ambien use, including tolerance and addiction potential. I will discuss this with him once we have evaluated possible sleep apnea.  Medications Added to Medication List This Visit: 1)  Avodart 0.5 Mg Caps (Dutasteride) .... Take 1 tablet by mouth once a day   Patient Instructions: 1)  will set up for sleep study 2)  will call when results available.   ]

## 2010-02-06 NOTE — Progress Notes (Signed)
Summary: Refill--Zolpidem  Phone Note Refill Request Message from:  Fax from Pharmacy on June 09, 2009 10:49 AM  Refills Requested: Medication #1:  ZOLPIDEM TARTRATE 10 MG  TABS 1/2 or 1 by mouth at hs prn   Notes: [facility] Next Appointment Scheduled: none Initial call taken by: Lucious Groves,  June 09, 2009 10:50 AM  Follow-up for Phone Call        ok 3 ref Follow-up by: Tresa Garter MD,  June 09, 2009 12:58 PM  Additional Follow-up for Phone Call Additional follow up Details #1::        faxed. Additional Follow-up by: Lucious Groves,  June 12, 2009 11:36 AM    Prescriptions: ZOLPIDEM TARTRATE 10 MG  TABS (ZOLPIDEM TARTRATE) 1/2 or 1 by mouth at hs prn  #90 x 1   Entered by:   Lamar Sprinkles, CMA   Authorized by:   Tresa Garter MD   Signed by:   Lamar Sprinkles, CMA on 06/12/2009   Method used:   Printed then faxed to ...       [facility] (mail[facility]order)       [address] [place], [place]  [address]       Ph: [phone]       Fax: [phone]   RxID:   [id number]

## 2010-02-08 NOTE — Letter (Signed)
[facility]: [facility]   Imported By: Sherian Rein 01/12/2010 11:49:40  _____________________________________________________________________  External Attachment:    Type:   Image     Comment:   External Document

## 2010-04-20 ENCOUNTER — Ambulatory Visit (INDEPENDENT_AMBULATORY_CARE_PROVIDER_SITE_OTHER): Payer: PRIVATE HEALTH INSURANCE | Admitting: Pulmonary Disease

## 2010-04-20 ENCOUNTER — Encounter: Payer: Self-pay | Admitting: Pulmonary Disease

## 2010-04-20 ENCOUNTER — Ambulatory Visit: Payer: Self-pay | Admitting: Pulmonary Disease

## 2010-04-20 VITALS — BP 118/80 | HR 79 | Temp 98.6°F | Ht 73.0 in | Wt 248.8 lb

## 2010-04-20 DIAGNOSIS — G4733 Obstructive sleep apnea (adult) (pediatric): Secondary | ICD-10-CM

## 2010-04-20 NOTE — Patient Instructions (Signed)
Will try again on cpap. Please call if having tolerance issues Work on weight loss followup with me in 5weeks.

## 2010-04-20 NOTE — Assessment & Plan Note (Addendum)
The pt has moderate to severe osa, and has been intolerant to cpap in the past primarily due to the inconvenience and perceived lack of efficacy.  He has not wanted to try dental appliance.  I have explained there really are not a lot of options here, and he is willing to give cpap another try.  Will put on auto mode this time to see if more comfortable for him, but I think ultimately success will depend on how accepting he is of the treatment. I have also encouraged him to work on weight loss.

## 2010-04-20 NOTE — Progress Notes (Signed)
  Subjective:    Patient ID: Chris Obrien, male    DOB: [birth year], 60 y.o.   MRN: [id number]  HPI The pt comes in today to review again treatment options for his severe osa.  He has been on cpap in the past with poor tolerance secondary to "inconvenience and it doesn't work".  I have discussed with him dental appliances, and he really does not have overly significant abnormalities of his upper airway anatomy.  He continues to take sedative hypnotics at [facility] for sleep, and I have warned him again about the issue with severe sleep apnea.  I have frankly told him will not prescribe benzodiazepines for him with his osa.  He continues to have poor sleep and daytime fatigue and sleepiness.    Review of Systems  Constitutional: Negative for fever and unexpected weight change.  HENT: Negative for ear pain, nosebleeds, congestion, sore throat, rhinorrhea, sneezing, trouble swallowing, dental problem, postnasal drip and sinus pressure.   Eyes: Negative for redness and itching.  Respiratory: Negative for cough, chest tightness, shortness of breath and wheezing.   Cardiovascular: Negative for palpitations and leg swelling.  Gastrointestinal: Negative for nausea and vomiting.  Genitourinary: Negative for dysuria.  Musculoskeletal: Negative for joint swelling.  Skin: Negative for rash.  Neurological: Negative for headaches.  Hematological: Does not bruise/bleed easily.  Psychiatric/Behavioral: Negative for dysphoric mood. The patient is not nervous/anxious.        Objective:   Physical Exam Overweight male in nad Nares patent, op without significant tissue redundancy LE without edema, no cyanosis  Appears sleepy, oriented, moves all 4        Assessment & Plan:

## 2010-05-22 NOTE — Procedures (Signed)
NAMEDONNEL, Chris Obrien NO.:  [id number]   MEDICAL RECORD NO.:  [id number]          PATIENT TYPE:  OUT   LOCATION:  SLEEP CENTER                 FACILITY:  [facility]   PHYSICIAN:  Barbaraann Share, MD,FCCPDATE OF BIRTH:  1950/01/20   DATE OF STUDY:  12/23/2007                            NOCTURNAL POLYSOMNOGRAM   REFERRING PHYSICIAN:  Barbaraann Share, MD,FCCP   REFERRING PHYSICIAN:  Barbaraann Share, MD,FCCP   INDICATION FOR STUDY:  Hypersomnia with sleep apnea.   EPWORTH SLEEPINESS SCORE:  13.   MEDICATIONS:   SLEEP ARCHITECTURE:  The patient had a total sleep time of 366 minutes  with no slow wave sleep and decreased quantity of REM.  Sleep onset  latency was normal at 16 minutes, and REM onset was normal at 83  minutes.  Sleep efficiency was decreased at 82%.   RESPIRATORY DATA:  The patient was found to have 97 apneas and 118  hypopneas for an apnea-hypopnea index of 35 events per hour.  He was  also found to have 98 respiratory effort-related arousals, giving him a  respiratory disturbance index of 51 events per hour.  The events were  worse in the supine position and there was moderate snoring noted  throughout.   OXYGEN DATA:  There was desaturation as low as 83% with the patient's  obstructive events.   CARDIAC DATA:  Occasional PAC noted, but no clinically significant  arrhythmia seen.   MOVEMENT/PARASOMNIA:  There were no leg jerks or other abnormal  behaviors.   IMPRESSIONS/RECOMMENDATIONS:  1. Severe obstructive sleep apnea/hypopnea syndrome with an apnea-      hypopnea index of 35 events per hour, and a respiratory disturbance      index of 51 events per hour.  There was O2 desaturation as low as      83%.  Treatment      for this degree of sleep apnea should focus on weight loss, if      applicable, as well as CPAP.  2. Occasional PAC noted, but no significant arrhythmias seen.      Barbaraann Share, MD,FCCP  Diplomate, American Board of  Sleep  Medicine  Electronically Signed     [facility]/MEDQ  D:  01/19/2008 14:08:20  T:  01/20/2008 01:34:57  Job:  [id number]

## 2010-05-25 NOTE — Assessment & Plan Note (Signed)
[facility]                           PRIMARY CARE OFFICE NOTE   NAME:Chris Obrien, Chris Obrien                      MRN:          [id number]  DATE:04/16/2006                            DOB:          Jul 30, 1950    The patient is a 60 year old male who presents for a wellness  examination.   He has no areas of complaint except for chronic urinary complaint of  decreased stream and dripping. It has gotten better on Avodart on  Flomax, however these meds have dried up his semen.  He has been having  trouble sleeping.  The rest of the 18-point review of systems is  negative.   Past medical history, family history and social history as per September 06, 2003, note.  He continues to be very active physically.   PHYSICAL EXAMINATION:  GENERAL:  He looks well.  He is in no acute  distress.  VITAL SIGNS:  Blood pressure 129/76, pulse 74, temperature 97.7.  Weight  is 139 pounds.  HEENT:  With moist mucosa.  Normal hearing and vision.  NECK:  Supple.  No thyromegaly or bruit.  LUNGS:  Clear.  No wheeze or rales.  HEART: S1, S2, no murmur or gallop.  ABDOMEN:  Soft, nontender, no organomegaly.  MUSCULOSKELETAL:  Lower extremities without edema.  NEUROLOGIC:  He is alert and nonfocal.  He denies being depressed.  RECTAL:  Slightly enlarged prostate.  Stool guaiac-negative.  No masses.   LABORATORY DATA:  On April 14, 2006, his CBG is normal, glucose 101.  CMET normal.  Cholesterol 180, LDL 112.  TSH 1.46.  PSA 0.62.  Urinalysis normal.   ASSESSMENT AND PLAN:  1. Normal wellness examination.  Age/health-related issues discussed.      Healthy lifestyle discussed.  Will continue with exercise.  Repeat      exam in 12 months.  2. Benign prostatic hypertrophy symptoms, worsening.  He is on Avodart      and Flomax. I would like to obtain urology consult with Dr.      Sherron Monday.  3. 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  Will continue for now.  Plan as per      #2.  4.  Insomnia.  I gave him Rozerem to try 8 mg at night.     Georgina Quint. Plotnikov, MD  Electronically Signed    AVP/MedQ  DD: 04/21/2006  DT: 04/21/2006  Job #: [id number]   cc:   Martina Sinner, MD

## 2010-07-01 ENCOUNTER — Other Ambulatory Visit: Payer: Self-pay | Admitting: Pulmonary Disease

## 2010-07-01 DIAGNOSIS — G4733 Obstructive sleep apnea (adult) (pediatric): Secondary | ICD-10-CM

## 2010-08-08 HISTORY — PX: KNEE ARTHROSCOPY: SHX127

## 2010-09-16 ENCOUNTER — Telehealth: Payer: Self-pay | Admitting: Pulmonary Disease

## 2010-09-16 DIAGNOSIS — G4733 Obstructive sleep apnea (adult) (pediatric): Secondary | ICD-10-CM

## 2010-09-16 NOTE — Telephone Encounter (Signed)
Pt never followed up at 5 weeks, and his download shows only a 50% compliance thru July Megan, please call pt and see how he is doing on cpap.  Thanks

## 2010-09-26 NOTE — Telephone Encounter (Signed)
Let him know that I do not prescribe sedative hypnotics for sleep.  They are the worst thing you can do. I would like to get a download off his machine to see if there is anything I need to address. Regarding his insomnia, I would recommend a consult with a behavioral therapist to help with relaxation techniques, biofeedback, etc.  His primary md can make a referral if he is willing to go.  Please see if dme can get a download for last 4 weeks off his machine

## 2010-09-26 NOTE — Telephone Encounter (Signed)
[id number] 

## 2010-09-26 NOTE — Telephone Encounter (Signed)
Called and spoke with pt.  Pt states he is "doing fine" on his cpap machine.  Wears every night.  Approx 2 to 3 hours per night.  Pt denies difficulty with mask or pressure- just states he "doesn'tt sleep well"  States he has used Ambien in the past and wonders if he could get a rx for this to help stay asleep.  KC, please advise.  Thanks.   [facility].

## 2010-09-27 NOTE — Telephone Encounter (Signed)
Pt returned call. Kathleen W Perdue  

## 2010-09-27 NOTE — Telephone Encounter (Signed)
Pt returning Megan's phone call.  Pt requests to be called at [phone].  Pt stated he is on crutches & is unable to answer the other number.  Antionette Fairy'

## 2010-09-27 NOTE — Telephone Encounter (Signed)
[id number] 

## 2010-09-27 NOTE — Telephone Encounter (Signed)
Pt is aware of all recs per Dr. Shelle Iron. I will send an order for [facility] to get data from his machine from the past 4 weeks. The pt refused any type of behavorial therapy and said he does not have time for that.

## 2010-12-29 ENCOUNTER — Ambulatory Visit (INDEPENDENT_AMBULATORY_CARE_PROVIDER_SITE_OTHER): Payer: PRIVATE HEALTH INSURANCE

## 2010-12-29 DIAGNOSIS — S51009A Unspecified open wound of unspecified elbow, initial encounter: Secondary | ICD-10-CM

## 2010-12-29 DIAGNOSIS — M79609 Pain in unspecified limb: Secondary | ICD-10-CM

## 2010-12-29 DIAGNOSIS — Z23 Encounter for immunization: Secondary | ICD-10-CM

## 2011-01-08 ENCOUNTER — Encounter (INDEPENDENT_AMBULATORY_CARE_PROVIDER_SITE_OTHER): Payer: PRIVATE HEALTH INSURANCE

## 2011-01-08 DIAGNOSIS — S41109A Unspecified open wound of unspecified upper arm, initial encounter: Secondary | ICD-10-CM

## 2011-09-09 ENCOUNTER — Encounter (HOSPITAL_COMMUNITY): Payer: Self-pay | Admitting: *Deleted

## 2011-09-09 ENCOUNTER — Emergency Department (HOSPITAL_COMMUNITY)
Admission: EM | Admit: 2011-09-09 | Discharge: 2011-09-09 | Disposition: A | Discharge status: Home / Self Care | Payer: PRIVATE HEALTH INSURANCE | Attending: Emergency Medicine | Admitting: Emergency Medicine

## 2011-09-09 DIAGNOSIS — S0181XA Laceration without foreign body of other part of head, initial encounter: Secondary | ICD-10-CM

## 2011-09-09 DIAGNOSIS — S0180XA Unspecified open wound of other part of head, initial encounter: Secondary | ICD-10-CM | POA: Insufficient documentation

## 2011-09-09 DIAGNOSIS — IMO0002 Reserved for concepts with insufficient information to code with codable children: Secondary | ICD-10-CM | POA: Insufficient documentation

## 2011-09-09 DIAGNOSIS — Z87891 Personal history of nicotine dependence: Secondary | ICD-10-CM | POA: Insufficient documentation

## 2011-09-09 NOTE — ED Provider Notes (Signed)
LACERATION REPAIR Performed by: Trixie Dredge B Authorized by: Trixie Dredge B Consent: Verbal consent obtained. Risks and benefits: risks, benefits and alternatives were discussed Consent given by: patient Patient identity confirmed: provided demographic data Prepped and Draped in normal sterile fashion Wound explored  Laceration Location: left eyebrow  Laceration Length: 2cm  No Foreign Bodies seen or palpated  Anesthesia: local infiltration  Local anesthetic: lidocaine 2% with epinephrine  Anesthetic total: 3 ml  Irrigation method: syringe Amount of cleaning: standard  Skin closure: 5-0 prolene  Number of sutures: 5  Technique: simple interrupted  Patient tolerance: Patient tolerated the procedure well with no immediate complications.  Wound care discussed with patient and spouse.     [place], [place] 09/09/11 2021
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
## 2011-09-09 NOTE — Discharge Instructions (Signed)
Facial Laceration A facial laceration is a cut on the face. It can take 1 to 2 years for the scar to heal completely. HOME CARE  For stitches (sutures):  Keep the cut clean and dry.   If you have a bandage (dressing), change it at least once a day. Change the bandage if it gets wet or dirty, or as told by your doctor.   Wash the cut with soap and water 2 times a day. Rinse the cut with water. Pat it dry with a clean towel.   Put a thin layer of medicated cream on the cut as told by your doctor.   You may shower after the first 24 hours. Do not soak the cut in water until the stitches are removed.   Only take medicines as told by your doctor.   Have your stitches removed as told by your doctor.   Do not wear makeup until a few days after your stitches are removed.  For skin adhesive strips:  Keep the cut clean and dry.   Do not get the strips wet. You may take a bath, but be careful to keep the cut dry.   If the cut gets wet, pat it dry with a clean towel.   The strips will fall off on their own. Do not remove the strips that are still stuck to the cut.  For wound glue:  You may shower or take baths. Do not soak or scrub the cut. Do not swim. Avoid heavy sweating until the glue falls off on its own. After a shower or bath, pat the cut dry with a clean towel.   Do not put medicine or makeup on your cut until the glue falls off.   If you have a bandage, do not put tape over the glue.   Avoid lots of sunlight or tanning lamps until the glue falls off. Put sunscreen on the cut for the first year to reduce your scar.   The glue will fall off on its own. Do not pick at the glue.  You may need a tetanus shot if:  You cannot remember when you had your last tetanus shot.   You have never had a tetanus shot.  If you need a tetanus shot and you choose not to have one, you may get tetanus. Sickness from tetanus can be serious. GET HELP RIGHT AWAY IF:   Your cut gets red, painful,  or puffy (swollen).   There is yellowish-white fluid (pus) coming from the cut.   You have chills or a fever.  MAKE SURE YOU:   Understand these instructions.   Will watch your condition.   Will get help right away if you are not doing well or get worse.  Document Released: 06/12/2007 Document Revised: 12/13/2010 Document Reviewed: 06/19/2010 [employer] Patient Information 2012 [employer]. 
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
## 2011-09-23 ENCOUNTER — Ambulatory Visit (INDEPENDENT_AMBULATORY_CARE_PROVIDER_SITE_OTHER): Payer: PRIVATE HEALTH INSURANCE | Admitting: Internal Medicine

## 2011-09-23 ENCOUNTER — Encounter: Payer: Self-pay | Admitting: Internal Medicine

## 2011-09-23 VITALS — BP 130/70 | HR 72 | Temp 99.4°F | Resp 16 | Ht 73.0 in | Wt 242.0 lb

## 2011-09-23 DIAGNOSIS — Z0000 Encounter for general adult medical examination without abnormal findings: Secondary | ICD-10-CM

## 2011-09-23 DIAGNOSIS — Z23 Encounter for immunization: Secondary | ICD-10-CM

## 2011-09-23 DIAGNOSIS — G4733 Obstructive sleep apnea (adult) (pediatric): Secondary | ICD-10-CM

## 2011-09-23 DIAGNOSIS — M25569 Pain in unspecified knee: Secondary | ICD-10-CM

## 2011-09-23 DIAGNOSIS — G4700 Insomnia, unspecified: Secondary | ICD-10-CM

## 2011-09-23 DIAGNOSIS — Z87898 Personal history of other specified conditions: Secondary | ICD-10-CM

## 2011-09-23 MED ORDER — ZOLPIDEM TARTRATE 10 MG PO TABS: 10.0000 mg | ORAL_TABLET | Freq: Every evening | ORAL | Status: DC | PRN

## 2011-09-23 MED ORDER — VITAMIN D 1000 UNITS PO TABS: 1000.0000 [IU] | ORAL_TABLET | Freq: Every day | ORAL | Status: DC

## 2011-09-23 NOTE — Progress Notes (Signed)
Patient ID: Chris Obrien, male   DOB: 18-Apr-1950, 61 y.o.   MRN: [id number]

## 2011-09-23 NOTE — Progress Notes (Signed)
Subjective:    Patient ID: Chris Obrien, male    DOB: 01-23-1950, 61 y.o.   MRN: [id number]  HPI  The patient is here for a wellness exam. The patient has been doing well overall without major physical or psychological issues going on lately.  C/o R knee pain - worse x 1 wk, he is going to [place] to hunt moose. C/o insomnia sometimes...worse when traveling  Review of Systems  Constitutional: Negative for chills, activity change, appetite change, fatigue and unexpected weight change.  HENT: Negative for nosebleeds, congestion, sore throat, rhinorrhea, sneezing, mouth sores, trouble swallowing and neck pain.   Eyes: Negative for itching and visual disturbance.  Respiratory: Positive for apnea. Negative for cough, choking, chest tightness, shortness of breath, wheezing and stridor.   Cardiovascular: Negative for chest pain, palpitations and leg swelling.  Gastrointestinal: Negative for nausea, diarrhea, constipation, blood in stool and abdominal distention.  Genitourinary: Negative for dysuria, urgency, frequency, hematuria, flank pain, decreased urine volume and difficulty urinating.  Musculoskeletal: Positive for arthralgias. Negative for myalgias, back pain, joint swelling and gait problem.  Skin: Negative for pallor, rash and wound.  Neurological: Negative for dizziness, tremors, speech difficulty and weakness.  Hematological: Does not bruise/bleed easily.  Psychiatric/Behavioral: Positive for disturbed wake/sleep cycle. Negative for suicidal ideas, confusion, dysphoric mood and agitation. The patient is not nervous/anxious.        Objective:   Physical Exam  Constitutional: He is oriented to person, place, and time. He appears well-developed and well-nourished. No distress.  HENT:  Head: Normocephalic and atraumatic.  Right Ear: External ear normal.  Left Ear: External ear normal.  Nose: Nose normal.  Mouth/Throat: Oropharynx is clear and moist. No oropharyngeal exudate.    Eyes: Conjunctivae normal and EOM are normal. Pupils are equal, round, and reactive to light. Right eye exhibits no discharge. Left eye exhibits no discharge. No scleral icterus.  Neck: Normal range of motion. Neck supple. No JVD present. No tracheal deviation present. No thyromegaly present.  Cardiovascular: Normal rate, regular rhythm, normal heart sounds and intact distal pulses.  Exam reveals no gallop and no friction rub.   No murmur heard. Pulmonary/Chest: Effort normal and breath sounds normal. No stridor. No respiratory distress. He has no wheezes. He has no rales. He exhibits no tenderness.  Abdominal: Soft. Bowel sounds are normal. He exhibits no distension and no mass. There is no tenderness. There is no rebound and no guarding.  Genitourinary: Rectum normal and penis normal. Guaiac negative stool. No penile tenderness.       Prostate 1+ NT  Musculoskeletal: Normal range of motion. He exhibits tenderness (R knee is tender). He exhibits no edema.  Lymphadenopathy:    He has no cervical adenopathy.  Neurological: He is alert and oriented to person, place, and time. He has normal reflexes. No cranial nerve deficit. He exhibits normal muscle tone. Coordination normal.  Skin: Skin is warm and dry. No rash noted. He is not diaphoretic. No erythema. No pallor.  Psychiatric: He has a normal mood and affect. His behavior is normal. Judgment and thought content normal.       Procedure Note :     Procedure :Joint Injection,   R knee   Indication:  Joint osteoarthritis with refractory  chronic pain.   Risks including unsuccessful procedure , bleeding, infection, bruising, skin atrophy and others were explained to the patient in detail as well as the benefits. Informed consent was obtained and signed.   Tthe patient was placed  in a comfortable position. Lateral approach was used. Skin was prepped with Betadine and alcohol  and anesthetized a cooling spray. Then, a 5 cc syringe with a 1.5  inch long 25-gauge needle was used for a joint injection.. The needle was advanced  Into the knee joint cavity. I aspirated a small amount of intra-articular fluid to confirm correct placement of the needle and injected the joint with 5 mL of 2% lidocaine and 40 mg of Depo-Medrol .  Band-Aid was applied.   Tolerated well. Complications: None. Good pain relief following the procedure.   Postprocedure instructions :    A Band-Aid should be left on for 12 hours. Injection therapy is not a cure itself. It is used in conjunction with other modalities. You can use nonsteroidal anti-inflammatories like ibuprofen , hot and cold compresses. Rest is recommended in the next 24 hours. You need to report immediately  if fever, chills or any signs of infection develop.     Assessment & Plan:

## 2011-09-23 NOTE — Patient Instructions (Signed)
Postprocedure instructions :    A Band-Aid should be left on for 12 hours. Injection therapy is not a cure itself. It is used in conjunction with other modalities. You can use nonsteroidal anti-inflammatories like ibuprofen , hot and cold compresses. Rest is recommended in the next 24 hours. You need to report immediately  if fever, chills or any signs of infection develop. 

## 2011-09-24 DIAGNOSIS — M25569 Pain in unspecified knee: Secondary | ICD-10-CM | POA: Insufficient documentation

## 2011-09-24 DIAGNOSIS — Z0000 Encounter for general adult medical examination without abnormal findings: Secondary | ICD-10-CM | POA: Insufficient documentation

## 2011-09-24 MED ORDER — METHYLPREDNISOLONE ACETATE 80 MG/ML IJ SUSP: 40.0000 mg | Freq: Once | INTRAMUSCULAR | Status: DC

## 2011-09-24 NOTE — Assessment & Plan Note (Signed)
He asked me to inject the knee. See procedure note.

## 2011-09-24 NOTE — Assessment & Plan Note (Signed)
Continue with current prescription therapy as reflected on the Med list.  

## 2011-09-24 NOTE — Assessment & Plan Note (Signed)
Zolpidem prn 

## 2011-09-24 NOTE — Assessment & Plan Note (Signed)
We discussed age appropriate health related issues, including available/recomended screening tests and vaccinations. We discussed a need for adhering to healthy diet and exercise. Labs/EKG were reviewed/ordered. All questions were answered. Labs ordered    

## 2011-09-24 NOTE — Assessment & Plan Note (Signed)
Cont CPAP Contour pillow

## 2012-01-16 ENCOUNTER — Encounter (HOSPITAL_COMMUNITY): Payer: Self-pay | Admitting: Pharmacy Technician

## 2012-01-16 NOTE — H&P (Signed)
TOTAL KNEE ADMISSION H&P  Patient is being admitted for right total knee arthroplasty.  Subjective:  Chief Complaint:  Right knee OA / pain.  HPI: Chris Obrien, 62 y.o. male, has a history of pain and functional disability in the right knee due to arthritis and has failed non-surgical conservative treatments for greater than 12 weeks to includeNSAID's and/or analgesics, use of assistive devices and activity modification.  Onset of symptoms was gradual, starting >10 years ago with gradually worsening course since that time. The patient noted prior procedures on the knee to include  arthroscopy x3, last being in August of 2012, on the right knee(s).  Patient currently rates pain in the right knee(s) at 8 out of 10 with activity. Patient has night pain, worsening of pain with activity and weight bearing, pain that interferes with activities of daily living, pain with passive range of motion, crepitus and joint swelling.  Patient has evidence of periarticular osteophytes and joint space narrowing by imaging studies. There is no active infection.  Risks, benefits and expectations were discussed with the patient. Patient understand the risks, benefits and expectations and wishes to proceed with surgery.   D/C Plans:  Home with HHPT  Post-op Meds:   Rx given for ASA, Robaxin, Iron, Colace and MiraLax  Tranexamic Acid:  To be given  Decadron:   To be given   FYI:   Will need a CPAP order after surgery  Patient Active Problem List   Diagnosis Date Noted  . Well adult exam 09/24/2011  . Knee pain 09/24/2011  . OBSTRUCTIVE SLEEP APNEA 02/01/2008  . SNORING 11/19/2007  . DYSPNEA 07/29/2007  . CHEST PAIN, UNSPECIFIED 07/29/2007  . POLYP, COLON 05/30/2007  . IRREGULAR HEART RATE 05/30/2007  . INSOMNIA 05/30/2007  . BENIGN PROSTATIC HYPERTROPHY, HX OF 05/30/2007  . ARTHROSCOPY, KNEE, HX OF 05/30/2007   Past Medical History  Diagnosis Date  . Benign neoplasm of colon   . Cardiac dysrhythmia,  unspecified   . Insomnia, unspecified   . Oligospermia   . Benign prostatic hypertrophy     Past Surgical History  Procedure Date  . Knee arthroscopy      No Known Allergies   History  Substance Use Topics  . Smoking status: Former Smoker    Types: Cigars    Quit date: 01/08/2008  . Smokeless tobacco: Never Used  . Alcohol Use: Yes     Comment: occasionally    Family History  Problem Relation Age of Onset  . Hyperlipidemia    . Alzheimer's disease Mother   . Arthritis Father   . Diabetes Brother      Review of Systems  Constitutional: Negative.   HENT: Negative.   Eyes: Negative.   Respiratory: Negative.   Cardiovascular: Negative.   Gastrointestinal: Negative.   Genitourinary: Negative.   Musculoskeletal: Positive for joint pain.  Skin: Negative.   Neurological: Negative.   Endo/Heme/Allergies: Negative.   Psychiatric/Behavioral: Negative.     Objective:  Physical Exam  Constitutional: He is oriented to person, place, and time. He appears well-developed and well-nourished.  HENT:  Head: Normocephalic and atraumatic.  Mouth/Throat: Oropharynx is clear and moist.  Eyes: Pupils are equal, round, and reactive to light.  Neck: Neck supple. No JVD present. No tracheal deviation present. No thyromegaly present.  Cardiovascular: Normal rate, regular rhythm, normal heart sounds and intact distal pulses.   Respiratory: Effort normal and breath sounds normal. No respiratory distress. He has no wheezes.  GI: Soft. There is no  tenderness. There is no guarding.  Musculoskeletal:       Right knee: He exhibits decreased range of motion, swelling and effusion. He exhibits no ecchymosis, no deformity, no laceration and no erythema. tenderness found.  Lymphadenopathy:    He has no cervical adenopathy.  Neurological: He is alert and oriented to person, place, and time.  Skin: Skin is warm and dry.  Psychiatric: He has a normal mood and affect.     Labs:  Estimated  Body mass index is 31.93 kg/(m^2) as calculated from the following:   Height as of 09/23/11: 6\' 1" (1.854 m).   Weight as of 09/23/11: 242 lb(109.77 kg).   Imaging Review Plain radiographs demonstrate severe degenerative joint disease of the right knee(s). The overall alignment isneutral. The bone quality appears to be good for age and reported activity level.  Assessment/Plan:  End stage arthritis, right knee   The patient history, physical examination, clinical judgment of the provider and imaging studies are consistent with end stage degenerative joint disease of the right knee(s) and total knee arthroplasty is deemed medically necessary. The treatment options including medical management, injection therapy arthroscopy and arthroplasty were discussed at length. The risks and benefits of total knee arthroplasty were presented and reviewed. The risks due to aseptic loosening, infection, stiffness, patella tracking problems, thromboembolic complications and other imponderables were discussed. The patient acknowledged the explanation, agreed to proceed with the plan and consent was signed. Patient is being admitted for inpatient treatment for surgery, pain control, PT, OT, prophylactic antibiotics, VTE prophylaxis, progressive ambulation and ADL's and discharge planning. The patient is planning to be discharged home with home health services.    Chris Obrien   PAC  01/16/2012, 3:45 PM

## 2012-01-21 ENCOUNTER — Encounter (HOSPITAL_COMMUNITY): Payer: Self-pay

## 2012-01-21 ENCOUNTER — Encounter (HOSPITAL_COMMUNITY)
Admission: RE | Admit: 2012-01-21 | Discharge: 2012-01-21 | Disposition: A | Discharge status: Home / Self Care | Payer: PRIVATE HEALTH INSURANCE | Source: Ambulatory Visit | Attending: Orthopedic Surgery | Admitting: Orthopedic Surgery

## 2012-01-21 HISTORY — DX: Unspecified osteoarthritis, unspecified site: M19.90

## 2012-01-21 HISTORY — DX: Sleep apnea, unspecified: G47.30

## 2012-01-21 LAB — URINALYSIS, ROUTINE W REFLEX MICROSCOPIC
Bilirubin Urine: NEGATIVE
Glucose, UA: NEGATIVE mg/dL
Hgb urine dipstick: NEGATIVE
Ketones, ur: NEGATIVE mg/dL
Leukocytes, UA: NEGATIVE
Nitrite: NEGATIVE
Protein, ur: NEGATIVE mg/dL
Specific Gravity, Urine: 1.01 (ref 1.005–1.030)
Urobilinogen, UA: 0.2 mg/dL (ref 0.0–1.0)
pH: 6 (ref 5.0–8.0)

## 2012-01-21 LAB — BASIC METABOLIC PANEL
BUN: 19 mg/dL (ref 6–23)
CO2: 27 mEq/L (ref 19–32)
Calcium: 8.9 mg/dL (ref 8.4–10.5)
Chloride: 104 mEq/L (ref 96–112)
Creatinine, Ser: 0.98 mg/dL (ref 0.50–1.35)
GFR calc Af Amer: >90 mL/min (ref >90)
GFR calc non Af Amer: 86 mL/min — ABNORMAL LOW (ref >90)
Glucose, Bld: 106 mg/dL — ABNORMAL HIGH (ref 70–99)
Potassium: 4.1 mEq/L (ref 3.5–5.1)
Sodium: 140 mEq/L (ref 135–145)

## 2012-01-21 LAB — CBC
HCT: 42.4 % (ref 39.0–52.0)
Hemoglobin: 14.9 g/dL (ref 13.0–17.0)
MCH: 31.1 pg (ref 26.0–34.0)
MCHC: 35.1 g/dL (ref 30.0–36.0)
MCV: 88.5 fL (ref 78.0–100.0)
Platelets: 259 10*3/uL (ref 150–400)
RBC: 4.79 MIL/uL (ref 4.22–5.81)
RDW: 12.7 % (ref 11.5–15.5)
WBC: 4.4 10*3/uL (ref 4.0–10.5)

## 2012-01-21 LAB — SURGICAL PCR SCREEN
MRSA, PCR: NEGATIVE
Staphylococcus aureus: NEGATIVE

## 2012-01-21 LAB — APTT: aPTT: 29 seconds (ref 24–37)

## 2012-01-21 LAB — PROTIME-INR
INR: 1.02 (ref 0.00–1.49)
Prothrombin Time: 13.3 seconds (ref 11.6–15.2)

## 2012-01-21 NOTE — Patient Instructions (Signed)
Chris Obrien  01/21/2012                           YOUR PROCEDURE IS SCHEDULED ON: 01/28/12                PLEASE REPORT TO SHORT STAY CENTER AT :  11:00 AM               CALL THIS NUMBER IF ANY PROBLEMS THE DAY OF SURGERY :               [phone]                      REMEMBER:   Do not eat food or drink liquids AFTER MIDNIGHT  May have clear liquids UNTIL 6 HOURS BEFORE SURGERY ( 7:30 AM)  Clear liquids include soda, tea, black coffee, apple or grape juice, broth.  Take these medicines the morning of surgery with A SIP OF WATER:  FLOMAX   Do not wear jewelry, make-up   Do not wear lotions, powders, or perfumes.   Do not shave legs or underarms 12 hrs. before surgery (men may shave face)  Do not bring valuables to the hospital.  Contacts, dentures or bridgework may not be worn into surgery.  Leave suitcase in the car. After surgery it may be brought to your room.  For patients admitted to the hospital more than one night, checkout time is 11:00                          The day of discharge.   Patients discharged the day of surgery will not be allowed to drive home                             If going home same day of surgery, must have someone stay with you first                           24 hrs at home and arrange for some one to drive you home from hospital.    Special Instructions:   Please read over the following fact sheets that you were given:               1. MRSA  INFORMATION                      2. [place] PREPARING FOR SURGERY SHEET               3. INCENTIVE SPIROMETER               4. BRING C-PAP MASK AND TUBING                                                X_____________________________________________________________________        Failure to follow these instructions may result in cancellation of your surgery

## 2012-01-28 ENCOUNTER — Encounter (HOSPITAL_COMMUNITY): Payer: Self-pay | Admitting: Anesthesiology

## 2012-01-28 ENCOUNTER — Encounter (HOSPITAL_COMMUNITY): Payer: Self-pay | Admitting: Orthopedic Surgery

## 2012-01-28 ENCOUNTER — Encounter (HOSPITAL_COMMUNITY)
Admission: RE | Disposition: A | Discharge status: Home Health | Payer: Self-pay | Source: Ambulatory Visit | Attending: Orthopedic Surgery

## 2012-01-28 ENCOUNTER — Inpatient Hospital Stay (HOSPITAL_COMMUNITY)
Admission: RE | Admit: 2012-01-28 | Discharge: 2012-01-29 | DRG: 470 | Disposition: A | Discharge status: Home Health | Payer: PRIVATE HEALTH INSURANCE | Source: Ambulatory Visit | Attending: Orthopedic Surgery | Admitting: Orthopedic Surgery

## 2012-01-28 ENCOUNTER — Encounter (HOSPITAL_COMMUNITY): Payer: Self-pay | Admitting: *Deleted

## 2012-01-28 ENCOUNTER — Inpatient Hospital Stay (HOSPITAL_COMMUNITY): Payer: PRIVATE HEALTH INSURANCE | Admitting: Anesthesiology

## 2012-01-28 DIAGNOSIS — D500 Iron deficiency anemia secondary to blood loss (chronic): Secondary | ICD-10-CM | POA: Diagnosis not present

## 2012-01-28 DIAGNOSIS — E66811 Obesity, class 1: Secondary | ICD-10-CM | POA: Diagnosis present

## 2012-01-28 DIAGNOSIS — E669 Obesity, unspecified: Secondary | ICD-10-CM | POA: Diagnosis present

## 2012-01-28 DIAGNOSIS — Z96659 Presence of unspecified artificial knee joint: Secondary | ICD-10-CM

## 2012-01-28 DIAGNOSIS — N400 Enlarged prostate without lower urinary tract symptoms: Secondary | ICD-10-CM | POA: Diagnosis present

## 2012-01-28 DIAGNOSIS — M1710 Unilateral primary osteoarthritis, unspecified knee: Principal | ICD-10-CM | POA: Diagnosis present

## 2012-01-28 DIAGNOSIS — Z79899 Other long term (current) drug therapy: Secondary | ICD-10-CM

## 2012-01-28 DIAGNOSIS — D62 Acute posthemorrhagic anemia: Secondary | ICD-10-CM | POA: Diagnosis not present

## 2012-01-28 DIAGNOSIS — Z01812 Encounter for preprocedural laboratory examination: Secondary | ICD-10-CM

## 2012-01-28 DIAGNOSIS — G4733 Obstructive sleep apnea (adult) (pediatric): Secondary | ICD-10-CM | POA: Diagnosis present

## 2012-01-28 DIAGNOSIS — N4611 Organic oligospermia: Secondary | ICD-10-CM | POA: Diagnosis present

## 2012-01-28 HISTORY — PX: TOTAL KNEE ARTHROPLASTY: SHX125

## 2012-01-28 LAB — ABO/RH: ABO/RH(D): A POS

## 2012-01-28 LAB — TYPE AND SCREEN
ABO/RH(D): A POS
Antibody Screen: NEGATIVE

## 2012-01-28 SURGERY — ARTHROPLASTY, KNEE, TOTAL
Anesthesia: Spinal | Site: Knee | Laterality: Right | Wound class: Clean

## 2012-01-28 MED ORDER — SODIUM CHLORIDE 0.9 % IJ SOLN
INTRAMUSCULAR | Status: DC | PRN
  Administered 2012-01-28: 12:00:00

## 2012-01-28 MED ORDER — DOCUSATE SODIUM 100 MG PO CAPS
100.0000 mg | ORAL_CAPSULE | Freq: Two times a day (BID) | ORAL | Status: DC
  Administered 2012-01-28 – 2012-01-29 (×2): 100 mg via ORAL

## 2012-01-28 MED ORDER — ALUM & MAG HYDROXIDE-SIMETH 200-200-20 MG/5ML PO SUSP: 30.0000 mL | ORAL | Status: DC | PRN

## 2012-01-28 MED ORDER — PHENOL 1.4 % MT LIQD: 1.0000 | OROMUCOSAL | Status: DC | PRN

## 2012-01-28 MED ORDER — DEXAMETHASONE SODIUM PHOSPHATE 10 MG/ML IJ SOLN: 10.0000 mg | Freq: Once | INTRAMUSCULAR | Status: DC

## 2012-01-28 MED ORDER — FERROUS SULFATE 325 (65 FE) MG PO TABS
325.0000 mg | ORAL_TABLET | Freq: Three times a day (TID) | ORAL | Status: DC
  Administered 2012-01-29 (×2): 325 mg via ORAL
  Filled 2012-01-28 (×6): qty 1

## 2012-01-28 MED ORDER — HYDROMORPHONE HCL PF 1 MG/ML IJ SOLN: 0.2500 mg | INTRAMUSCULAR | Status: DC | PRN

## 2012-01-28 MED ORDER — HYDROMORPHONE HCL PF 1 MG/ML IJ SOLN
INTRAMUSCULAR | Status: AC
  Filled 2012-01-28: qty 1

## 2012-01-28 MED ORDER — ONDANSETRON HCL 4 MG/2ML IJ SOLN: 4.0000 mg | Freq: Four times a day (QID) | INTRAMUSCULAR | Status: DC | PRN

## 2012-01-28 MED ORDER — ZOLPIDEM TARTRATE 5 MG PO TABS: 5.0000 mg | ORAL_TABLET | Freq: Every evening | ORAL | Status: DC | PRN

## 2012-01-28 MED ORDER — DIPHENHYDRAMINE HCL 50 MG/ML IJ SOLN
INTRAMUSCULAR | Status: DC | PRN
  Administered 2012-01-28: 25 mg via INTRAVENOUS

## 2012-01-28 MED ORDER — DIPHENHYDRAMINE HCL 12.5 MG/5ML PO ELIX
25.0000 mg | ORAL_SOLUTION | Freq: Four times a day (QID) | ORAL | Status: DC | PRN
  Administered 2012-01-28: 25 mg via ORAL
  Filled 2012-01-28: qty 10

## 2012-01-28 MED ORDER — SODIUM CHLORIDE 0.9 % IV SOLN
INTRAVENOUS | Status: DC
  Administered 2012-01-28 – 2012-01-29 (×2): via INTRAVENOUS
  Filled 2012-01-28 (×8): qty 1000

## 2012-01-28 MED ORDER — LACTATED RINGERS IV SOLN
INTRAVENOUS | Status: DC
  Administered 2012-01-28: 1000 mL via INTRAVENOUS
  Administered 2012-01-28: 10:00:00 via INTRAVENOUS

## 2012-01-28 MED ORDER — PROMETHAZINE HCL 25 MG/ML IJ SOLN: 6.2500 mg | INTRAMUSCULAR | Status: DC | PRN

## 2012-01-28 MED ORDER — CHLORHEXIDINE GLUCONATE 4 % EX LIQD: 60.0000 mL | Freq: Once | CUTANEOUS | Status: DC

## 2012-01-28 MED ORDER — ONDANSETRON HCL 4 MG PO TABS: 4.0000 mg | ORAL_TABLET | Freq: Four times a day (QID) | ORAL | Status: DC | PRN

## 2012-01-28 MED ORDER — FENTANYL CITRATE 0.05 MG/ML IJ SOLN
INTRAMUSCULAR | Status: DC | PRN
  Administered 2012-01-28: 100 ug via INTRAVENOUS

## 2012-01-28 MED ORDER — CEFAZOLIN SODIUM-DEXTROSE 2-3 GM-% IV SOLR
2.0000 g | Freq: Four times a day (QID) | INTRAVENOUS | Status: AC
  Administered 2012-01-28 (×2): 2 g via INTRAVENOUS
  Filled 2012-01-28 (×2): qty 50

## 2012-01-28 MED ORDER — HYDROMORPHONE HCL PF 1 MG/ML IJ SOLN
0.2000 mg | INTRAMUSCULAR | Status: DC | PRN
  Administered 2012-01-28: 0.5 mg via INTRAVENOUS
  Filled 2012-01-28: qty 1

## 2012-01-28 MED ORDER — DEXAMETHASONE SODIUM PHOSPHATE 10 MG/ML IJ SOLN
10.0000 mg | Freq: Once | INTRAMUSCULAR | Status: AC
  Administered 2012-01-29: 10 mg via INTRAVENOUS
  Filled 2012-01-28 (×3): qty 1

## 2012-01-28 MED ORDER — SENNA 8.6 MG PO TABS
1.0000 | ORAL_TABLET | Freq: Two times a day (BID) | ORAL | Status: DC
  Administered 2012-01-28 – 2012-01-29 (×2): 8.6 mg via ORAL
  Filled 2012-01-28 (×2): qty 1

## 2012-01-28 MED ORDER — METHOCARBAMOL 100 MG/ML IJ SOLN
500.0000 mg | Freq: Four times a day (QID) | INTRAVENOUS | Status: DC | PRN
  Administered 2012-01-28: 500 mg via INTRAVENOUS
  Filled 2012-01-28: qty 5

## 2012-01-28 MED ORDER — RIVAROXABAN 10 MG PO TABS
10.0000 mg | ORAL_TABLET | Freq: Every day | ORAL | Status: DC
  Administered 2012-01-29: 10 mg via ORAL
  Filled 2012-01-28 (×3): qty 1

## 2012-01-28 MED ORDER — STERILE WATER FOR IRRIGATION IR SOLN
Status: DC | PRN
  Administered 2012-01-28: 3000 mL

## 2012-01-28 MED ORDER — 0.9 % SODIUM CHLORIDE (POUR BTL) OPTIME
TOPICAL | Status: DC | PRN
  Administered 2012-01-28: 1000 mL

## 2012-01-28 MED ORDER — FOLIC ACID 1 MG PO TABS
1.0000 mg | ORAL_TABLET | Freq: Every day | ORAL | Status: DC
  Filled 2012-01-28: qty 1

## 2012-01-28 MED ORDER — BUPIVACAINE LIPOSOME 1.3 % IJ SUSP
20.0000 mL | INTRAMUSCULAR | Status: AC
  Filled 2012-01-28: qty 20

## 2012-01-28 MED ORDER — SODIUM CHLORIDE 0.9 % IR SOLN
Status: DC | PRN
  Administered 2012-01-28: 1000 mL

## 2012-01-28 MED ORDER — PROPOFOL 10 MG/ML IV EMUL
INTRAVENOUS | Status: DC | PRN
  Administered 2012-01-28: 100 ug/kg/min via INTRAVENOUS

## 2012-01-28 MED ORDER — HYDROCODONE-ACETAMINOPHEN 7.5-325 MG PO TABS
1.0000 | ORAL_TABLET | ORAL | Status: DC
  Administered 2012-01-28 – 2012-01-29 (×5): 2 via ORAL
  Filled 2012-01-28 (×5): qty 2

## 2012-01-28 MED ORDER — HYDROMORPHONE HCL PF 1 MG/ML IJ SOLN
0.5000 mg | INTRAMUSCULAR | Status: DC | PRN
  Administered 2012-01-28: 1 mg via INTRAVENOUS

## 2012-01-28 MED ORDER — ONDANSETRON HCL 4 MG/2ML IJ SOLN
INTRAMUSCULAR | Status: DC | PRN
  Administered 2012-01-28: 4 mg via INTRAVENOUS

## 2012-01-28 MED ORDER — METHOCARBAMOL 500 MG PO TABS
500.0000 mg | ORAL_TABLET | Freq: Four times a day (QID) | ORAL | Status: DC | PRN
  Administered 2012-01-29 (×2): 500 mg via ORAL
  Filled 2012-01-28 (×3): qty 1

## 2012-01-28 MED ORDER — MIDAZOLAM HCL 5 MG/5ML IJ SOLN
INTRAMUSCULAR | Status: DC | PRN
  Administered 2012-01-28: 2 mg via INTRAVENOUS

## 2012-01-28 MED ORDER — TRANEXAMIC ACID 100 MG/ML IV SOLN
15.0000 mg/kg | Freq: Once | INTRAVENOUS | Status: AC
  Administered 2012-01-28: 1674 mg via INTRAVENOUS
  Filled 2012-01-28: qty 16.74

## 2012-01-28 MED ORDER — TAMSULOSIN HCL 0.4 MG PO CAPS
0.4000 mg | ORAL_CAPSULE | Freq: Every day | ORAL | Status: DC
  Administered 2012-01-29: 0.4 mg via ORAL
  Filled 2012-01-28 (×2): qty 1

## 2012-01-28 MED ORDER — ACETAMINOPHEN 10 MG/ML IV SOLN
INTRAVENOUS | Status: DC | PRN
  Administered 2012-01-28: 1000 mg via INTRAVENOUS

## 2012-01-28 MED ORDER — DEXAMETHASONE SODIUM PHOSPHATE 10 MG/ML IJ SOLN
INTRAMUSCULAR | Status: DC | PRN
  Administered 2012-01-28: 10 mg via INTRAVENOUS

## 2012-01-28 MED ORDER — POLYETHYLENE GLYCOL 3350 17 G PO PACK: 17.0000 g | PACK | Freq: Every day | ORAL | Status: DC | PRN

## 2012-01-28 MED ORDER — CEFAZOLIN SODIUM-DEXTROSE 2-3 GM-% IV SOLR
2.0000 g | INTRAVENOUS | Status: AC
  Administered 2012-01-28: 2 g via INTRAVENOUS

## 2012-01-28 MED ORDER — MENTHOL 3 MG MT LOZG: 1.0000 | LOZENGE | OROMUCOSAL | Status: DC | PRN

## 2012-01-28 SURGICAL SUPPLY — 58 items
ADH SKN CLS APL DERMABOND .7 (GAUZE/BANDAGES/DRESSINGS) ×1
BAG SPEC THK2 15X12 ZIP CLS (MISCELLANEOUS) ×1
BAG ZIPLOCK 12X15 (MISCELLANEOUS) ×2 IMPLANT
BANDAGE ELASTIC 6 VELCRO ST LF (GAUZE/BANDAGES/DRESSINGS) ×2 IMPLANT
BANDAGE ESMARK 6X9 LF (GAUZE/BANDAGES/DRESSINGS) ×1 IMPLANT
BLADE SAW SGTL 13.0X1.19X90.0M (BLADE) ×2 IMPLANT
BNDG CMPR 9X6 STRL LF SNTH (GAUZE/BANDAGES/DRESSINGS) ×1
BNDG ESMARK 6X9 LF (GAUZE/BANDAGES/DRESSINGS) ×2
BOWL SMART MIX CTS (DISPOSABLE) ×2 IMPLANT
CEMENT HV SMART SET (Cement) ×1 IMPLANT
CLOTH BEACON ORANGE TIMEOUT ST (SAFETY) ×2 IMPLANT
CUFF TOURN SGL QUICK 34 (TOURNIQUET CUFF) ×2
CUFF TRNQT CYL 34X4X40X1 (TOURNIQUET CUFF) ×1 IMPLANT
DECANTER SPIKE VIAL GLASS SM (MISCELLANEOUS) ×2 IMPLANT
DERMABOND ADVANCED (GAUZE/BANDAGES/DRESSINGS) ×1
DERMABOND ADVANCED .7 DNX12 (GAUZE/BANDAGES/DRESSINGS) ×1 IMPLANT
DRAPE EXTREMITY T 121X128X90 (DRAPE) ×2 IMPLANT
DRAPE POUCH INSTRU U-SHP 10X18 (DRAPES) ×2 IMPLANT
DRAPE U-SHAPE 47X51 STRL (DRAPES) ×2 IMPLANT
DRSG AQUACEL AG ADV 3.5X10 (GAUZE/BANDAGES/DRESSINGS) ×2 IMPLANT
DRSG TEGADERM 4X4.75 (GAUZE/BANDAGES/DRESSINGS) ×2 IMPLANT
DURAPREP 26ML APPLICATOR (WOUND CARE) ×2 IMPLANT
ELECT REM PT RETURN 9FT ADLT (ELECTROSURGICAL) ×2
ELECTRODE REM PT RTRN 9FT ADLT (ELECTROSURGICAL) ×1 IMPLANT
EVACUATOR 1/8 PVC DRAIN (DRAIN) ×2 IMPLANT
FACESHIELD LNG OPTICON STERILE (SAFETY) ×10 IMPLANT
GAUZE SPONGE 2X2 8PLY STRL LF (GAUZE/BANDAGES/DRESSINGS) ×1 IMPLANT
GLOVE BIOGEL PI IND STRL 7.5 (GLOVE) ×1 IMPLANT
GLOVE BIOGEL PI IND STRL 8 (GLOVE) ×1 IMPLANT
GLOVE BIOGEL PI INDICATOR 7.5 (GLOVE) ×1
GLOVE BIOGEL PI INDICATOR 8 (GLOVE) ×1
GLOVE ECLIPSE 8.0 STRL XLNG CF (GLOVE) ×2 IMPLANT
GLOVE ORTHO TXT STRL SZ7.5 (GLOVE) ×4 IMPLANT
GOWN BRE IMP PREV XXLGXLNG (GOWN DISPOSABLE) ×4 IMPLANT
GOWN STRL NON-REIN LRG LVL3 (GOWN DISPOSABLE) ×2 IMPLANT
HANDPIECE INTERPULSE COAX TIP (DISPOSABLE) ×2
IMMOBILIZER KNEE 20 (SOFTGOODS)
IMMOBILIZER KNEE 20 THIGH 36 (SOFTGOODS) IMPLANT
KIT BASIN OR (CUSTOM PROCEDURE TRAY) ×2 IMPLANT
MANIFOLD NEPTUNE II (INSTRUMENTS) ×2 IMPLANT
NDL SAFETY ECLIPSE 18X1.5 (NEEDLE) ×1 IMPLANT
NEEDLE HYPO 18GX1.5 SHARP (NEEDLE) ×2
NS IRRIG 1000ML POUR BTL (IV SOLUTION) ×4 IMPLANT
PACK TOTAL JOINT (CUSTOM PROCEDURE TRAY) ×2 IMPLANT
POSITIONER SURGICAL ARM (MISCELLANEOUS) ×2 IMPLANT
SET HNDPC FAN SPRY TIP SCT (DISPOSABLE) ×1 IMPLANT
SET PAD KNEE POSITIONER (MISCELLANEOUS) ×2 IMPLANT
SPONGE GAUZE 2X2 STER 10/PKG (GAUZE/BANDAGES/DRESSINGS) ×1
SUCTION FRAZIER 12FR DISP (SUCTIONS) ×2 IMPLANT
SUT MNCRL AB 4-0 PS2 18 (SUTURE) ×2 IMPLANT
SUT VIC AB 1 CT1 36 (SUTURE) ×6 IMPLANT
SUT VIC AB 2-0 CT1 27 (SUTURE) ×6
SUT VIC AB 2-0 CT1 TAPERPNT 27 (SUTURE) ×3 IMPLANT
SYR 50ML LL SCALE MARK (SYRINGE) ×2 IMPLANT
TOWEL OR 17X26 10 PK STRL BLUE (TOWEL DISPOSABLE) ×4 IMPLANT
TRAY FOLEY CATH 14FRSI W/METER (CATHETERS) ×2 IMPLANT
WATER STERILE IRR 1500ML POUR (IV SOLUTION) ×2 IMPLANT
WRAP KNEE MAXI GEL POST OP (GAUZE/BANDAGES/DRESSINGS) ×2 IMPLANT

## 2012-01-28 NOTE — Op Note (Signed)
NAME:  Chris Obrien [facility]                      MEDICAL RECORD NO.:  [id number]                             FACILITY:  [facility]      PHYSICIAN:  Madlyn Frankel. Charlann Boxer, M.D.  DATE OF BIRTH:  Jun 03, 1950      DATE OF PROCEDURE:  01/28/2012                                     OPERATIVE REPORT         PREOPERATIVE DIAGNOSIS:  Right knee osteoarthritis.      POSTOPERATIVE DIAGNOSIS:  Right knee osteoarthritis.      FINDINGS:  The patient was noted to have complete loss of cartilage and   bone-on-bone arthritis with associated osteophytes in the medial and patellofemoral compartments of   the knee with a significant synovitis and associated effusion.      PROCEDURE:  Right total knee replacement.      COMPONENTS USED:  DePuy rotating platform posterior stabilized knee   system, a size 5 femur, 5 tibia, 12.5 mm insert, and 41 patellar   button.      SURGEON:  Madlyn Frankel. Charlann Boxer, M.D.      ASSISTANT:  Leilani Able, PA-C.      ANESTHESIA:  Spinal.      SPECIMENS:  None.      COMPLICATION:  None.      DRAINS:  One Hemovac.  EBL: <150cc      TOURNIQUET TIME:   Total Tourniquet Time Documented: Thigh (Right) - 50 minutes .      The patient was stable to the recovery room.      INDICATION FOR PROCEDURE:  Chris Obrien is a 62 y.o. male patient of   mine.  The patient had been seen, evaluated, and treated conservatively in the   office with medication, activity modification, and injections.  The patient had   radiographic changes of bone-on-bone arthritis with endplate sclerosis and osteophytes noted.      The patient failed conservative measures including medication, injections, and activity modification, and at this point was ready for more definitive measures.   Based on the radiographic changes and failed conservative measures, the patient   decided to proceed with total knee replacement.  Risks of infection,   DVT, component failure, need for revision surgery, postop course, and   expectations were all   discussed and reviewed.  Consent was obtained for benefit of pain   relief.      PROCEDURE IN DETAIL:  The patient was brought to the operative theater.   Once adequate anesthesia, preoperative antibiotics, 2 gm of Ancef administered, the patient was positioned supine with the right thigh tourniquet placed.  The  right lower extremity was prepped and draped in sterile fashion.  A time-   out was performed identifying the patient, planned procedure, and   extremity.      The right lower extremity was placed in the [facility] leg holder.  The leg was   exsanguinated, tourniquet elevated to 250 mmHg.  A midline incision was   made followed by median parapatellar arthrotomy.  Following initial   exposure, attention was first directed to the patella.  Precut  measurement was noted to be 28 mm.  I resected down to 16 mm and used a   41 patellar button to restore patellar height as well as cover the cut   surface.      The lug holes were drilled and a metal shim was placed to protect the   patella from retractors and saw blades.      At this point, attention was now directed to the femur.  The femoral   canal was opened with a drill, irrigated to try to prevent fat emboli.  An   intramedullary rod was passed at 5 degrees valgus, 10 mm of bone was   resected off the distal femur.  Following this resection, the tibia was   subluxated anteriorly.  Using the extramedullary guide, 10 mm of bone was resected off   the proximal lateral tibia.  We confirmed the gap would be   stable medially and laterally with a 10 mm insert as well as confirmed   the cut was perpendicular in the coronal plane, checking with an alignment rod.      Once this was done, I sized the femur to be a size 5 in the anterior-   posterior dimension, chose a standard component based on medial and   lateral dimension.  The size 5 rotation block was then pinned in   position anterior referenced using the  C-clamp to set rotation.  The   anterior, posterior, and  chamfer cuts were made without difficulty nor   notching making certain that I was along the anterior cortex to help   with flexion gap stability.      The final box cut was made off the lateral aspect of distal femur.      At this point, the tibia was sized to be a size 5, the size 5 tray was   then pinned in position through the medial third of the tubercle,   drilled, and keel punched.  Trial reduction was now carried with a 5 femur,  5 tibia, a 12.5 mm insert, and the 41 patella botton.  The knee was brought to   extension, full extension with good flexion stability with the patella   tracking through the trochlea without application of pressure.  Given   all these findings, the trial components removed.  Final components were   opened and cement was mixed.  The knee was irrigated with normal saline   solution and pulse lavage.  The synovial lining was   then injected with 0.25% Marcaine with epinephrine and 1 cc of Toradol,   total of 61 cc.      The knee was irrigated.  Final implants were then cemented onto clean and   dried cut surfaces of bone with the knee brought to extension with a 12.5 mm trial insert.      Once the cement had fully cured, the excess cement was removed   throughout the knee.  I confirmed I was satisfied with the range of   motion and stability, and the final 12.5 mm PS insert was chosen.  It was   placed into the knee.      The tourniquet had been let down at 50 minutes.  No significant   hemostasis required.  The medium Hemovac drain was placed deep.  The   extensor mechanism was then reapproximated using #1 Vicryl with the knee   in flexion.  The   remaining wound was closed with 2-0 Vicryl and  running 4-0 Monocryl.   The knee was cleaned, dried, dressed sterilely using Dermabond and   Aquacel dressing.  Drain site dressed separately.  The patient was then   brought to recovery room in stable  condition, tolerating the procedure   well.   Please note that Physician Assistant, Leilani Able, was present for the entirety of the case, and was utilized for pre-operative positioning, peri-operative retractor management, general facilitation of the procedure.  He was also utilized for primary wound closure at the end of the case.              Madlyn Frankel Charlann Boxer, M.D.

## 2012-01-28 NOTE — Anesthesia Preprocedure Evaluation (Addendum)
Anesthesia Evaluation  Patient identified by MRN, date of birth, ID band Patient awake    Reviewed: Allergy & Precautions, H&P , NPO status , Patient's Chart, lab work & pertinent test results, reviewed documented beta blocker date and time   Airway Mallampati: II TM Distance: >3 FB Neck ROM: full    Dental No notable dental hx.    Pulmonary shortness of breath, sleep apnea ,  breath sounds clear to auscultation  Pulmonary exam normal       Cardiovascular Exercise Tolerance: Good + dysrhythmias Rhythm:regular Rate:Normal     Neuro/Psych negative neurological ROS  negative psych ROS   GI/Hepatic negative GI ROS, Neg liver ROS,   Endo/Other  negative endocrine ROS  Renal/GU negative Renal ROS  negative genitourinary   Musculoskeletal   Abdominal   Peds  Hematology negative hematology ROS (+)   Anesthesia Other Findings   Reproductive/Obstetrics negative OB ROS                           Anesthesia Physical Anesthesia Plan  ASA: II  Anesthesia Plan: Spinal   Post-op Pain Management:    Induction:   Airway Management Planned:   Additional Equipment:   Intra-op Plan:   Post-operative Plan:   Informed Consent: I have reviewed the patients History and Physical, chart, labs and discussed the procedure including the risks, benefits and alternatives for the proposed anesthesia with the patient or authorized representative who has indicated his/her understanding and acceptance.   Dental Advisory Given  Plan Discussed with: CRNA  Anesthesia Plan Comments: (Discussed risks/benefits of spinal including headache, backache, failure, bleeding, infection, and nerve damage. Patient consents to spinal. Questions answered. Coagulation studies and platelet count acceptable. )       Anesthesia Quick Evaluation

## 2012-01-28 NOTE — Anesthesia Procedure Notes (Addendum)
Anesthesia Regional Block:   Narrative:    Spinal   Spinal

## 2012-01-28 NOTE — Evaluation (Signed)
Physical Therapy Evaluation Patient Details Name: Chris Obrien MRN: [id number] DOB: December 08, 1950 Today's Date: 01/28/2012 Time: 4098-1191 PT Time Calculation (min): 24 min  PT Assessment / Plan / Recommendation Clinical Impression  Pt. is 62 y/o male admitted 01/28/12 for RTKA. Pt. was able to ambulate 150 ft with RW. Pt will benefit from PT to improve functional mobility., ROM, strength.    PT Assessment  Patient needs continued PT services    Follow Up Recommendations  Home health PT;Supervision/Assistance - 24 hour    Does the patient have the potential to tolerate intense rehabilitation      Barriers to Discharge        Equipment Recommendations  Rolling walker with 5" wheels    Recommendations for Other Services     Frequency 7X/week    Precautions / Restrictions Precautions Precautions: Knee Required Braces or Orthoses: Knee Immobilizer - Right Knee Immobilizer - Right: Discontinue once straight leg raise with < 10 degree lag   Pertinent Vitals/Pain 4-7 walking, ice applied, had IV meds.      Mobility  Bed Mobility Bed Mobility: Supine to Sit;Sit to Supine Supine to Sit: 5: Supervision;HOB flat Sit to Supine: 4: Min assist;HOB flat Details for Bed Mobility Assistance: support RLE Transfers Transfers: Sit to Stand;Stand to Sit Sit to Stand: 4: Min guard;From bed;With upper extremity assist Stand to Sit: 4: Min guard;To bed;With upper extremity assist Details for Transfer Assistance: cueas to slow down, for RLE placement prior to sitting. Ambulation/Gait Ambulation/Gait Assistance: 4: Min assist Ambulation Distance (Feet): 150 Feet Ambulation/Gait Assistance Details: cues for sequence and to slow speed. Gait Pattern: Step-to pattern;Antalgic;Decreased stance time - right    Shoulder Instructions     Exercises Total Joint Exercises Quad Sets: AROM;Right;10 reps Heel Slides: AROM;Right;10 reps Straight Leg Raises: AROM;Right;10 reps   PT Diagnosis:  Difficulty walking;Acute pain  PT Problem List: Decreased strength;Decreased range of motion;Decreased activity tolerance;Decreased mobility;Decreased knowledge of precautions;Pain;Decreased knowledge of use of DME PT Treatment Interventions: DME instruction;Gait training;Stair training;Functional mobility training;Therapeutic activities;Therapeutic exercise;Patient/family education   PT Goals Acute Rehab PT Goals PT Goal Formulation: With patient Time For Goal Achievement: 02/04/12 Potential to Achieve Goals: Good Pt will go Supine/Side to Sit: Independently PT Goal: Supine/Side to Sit - Progress: Goal set today Pt will go Sit to Supine/Side: Independently PT Goal: Sit to Supine/Side - Progress: Goal set today Pt will go Sit to Stand: with supervision PT Goal: Sit to Stand - Progress: Goal set today Pt will go Stand to Sit: with modified independence PT Goal: Stand to Sit - Progress: Goal set today Pt will Ambulate: >150 feet;with supervision;with least restrictive assistive device PT Goal: Ambulate - Progress: Goal set today Pt will Go Up / Down Stairs: 1-2 stairs;with supervision;with least restrictive assistive device PT Goal: Up/Down Stairs - Progress: Goal set today Pt will Perform Home Exercise Program: Independently PT Goal: Perform Home Exercise Program - Progress: Goal set today  Visit Information  Last PT Received On: 01/28/12 Assistance Needed: +1    Subjective Data  Subjective: Let'as see what I can do. Patient Stated Goal: to walk today   Prior Functioning  Home Living Lives With: Spouse Available Help at Discharge: Family Type of Home: House Home Access: Stairs to enter Secretary/administrator of Steps: 1/2 Home Layout: One level Bathroom Shower/Tub: Health visitor: Handicapped height Home Adaptive Equipment: None Prior Function Level of Independence: Independent Able to Take Stairs?: Yes Vocation: Full time employment    Cognition  Overall Cognitive Status: Appears within functional limits for tasks assessed/performed Arousal/Alertness: Awake/alert Orientation Level: Appears intact for tasks assessed Behavior During Session: [facility] for tasks performed Cognition - Other Comments: impulsive    Extremity/Trunk Assessment Right Lower Extremity Assessment RLE ROM/Strength/Tone: Deficits RLE ROM/Strength/Tone Deficits: able to SLR w/ lag 5 degrees. RLE Sensation: Deficits RLE Sensation Deficits: states cheeks ar numb from laying in bed. Left Lower Extremity Assessment LLE ROM/Strength/Tone: WFL for tasks assessed   Balance    End of Session PT - End of Session Activity Tolerance: Patient tolerated treatment well Patient left: in bed;with call bell/phone within reach Nurse Communication: Mobility status  GP     [name] 01/28/2012, 5:39 PM

## 2012-01-28 NOTE — Transfer of Care (Signed)
Immediate Anesthesia Transfer of Care Note  Patient: Chris Obrien  Procedure(s) Performed: Procedure(s) (LRB) with comments: TOTAL KNEE ARTHROPLASTY (Right)  Patient Location: PACU  Anesthesia Type:Regional  Level of Consciousness: awake, alert  and oriented  Airway & Oxygen Therapy: Patient Spontanous Breathing and Patient connected to face mask oxygen  Post-op Assessment: Report given to PACU RN and Post -op Vital signs reviewed and stable  Post vital signs: Reviewed and stable  Complications: No apparent anesthesia complications

## 2012-01-28 NOTE — Interval H&P Note (Signed)
History and Physical Interval Note:  01/28/2012 8:50 AM  Chris Obrien  has presented today for surgery, with the diagnosis of RIGHT KNEE OA  The various methods of treatment have been discussed with the patient and family. After consideration of risks, benefits and other options for treatment, the patient has consented to  Procedure(s) (LRB) with comments: TOTAL KNEE ARTHROPLASTY (Right) as a surgical intervention .  The patient's history has been reviewed, patient examined, no change in status, stable for surgery.  I have reviewed the patient's chart and labs.  Questions were answered to the patient's satisfaction.     [name]

## 2012-01-28 NOTE — Progress Notes (Signed)
CPAP setup for pt to use at night. He is unaware of his setting, so placed in auto titration mode 5-20 cmH2O. He has his mask/circuit, so hooked it up to our machine with 2L O2 bleed in. Pt tolerating well at this time.  Jacqulynn Cadet RRT

## 2012-01-28 NOTE — Anesthesia Postprocedure Evaluation (Signed)
  Anesthesia Post-op Note  Patient: Chris Obrien  Procedure(s) Performed: Procedure(s) (LRB): TOTAL KNEE ARTHROPLASTY (Right)  Patient Location: PACU  Anesthesia Type: Spinal  Level of Consciousness: awake and alert   Airway and Oxygen Therapy: Patient Spontanous Breathing  Post-op Pain: mild  Post-op Assessment: Post-op Vital signs reviewed, Patient's Cardiovascular Status Stable, Respiratory Function Stable, Patent Airway and No signs of Nausea or vomiting  Last Vitals:  Filed Vitals:   01/28/12 1330  BP: 121/72  Pulse: 64  Temp:   Resp: 13    Post-op Vital Signs: stable   Complications: No apparent anesthesia complications. Moving both feet/legs.

## 2012-01-29 ENCOUNTER — Encounter (HOSPITAL_COMMUNITY): Payer: Self-pay | Admitting: Orthopedic Surgery

## 2012-01-29 DIAGNOSIS — E669 Obesity, unspecified: Secondary | ICD-10-CM | POA: Diagnosis present

## 2012-01-29 DIAGNOSIS — D500 Iron deficiency anemia secondary to blood loss (chronic): Secondary | ICD-10-CM | POA: Diagnosis not present

## 2012-01-29 LAB — CBC
HCT: 37.5 % — ABNORMAL LOW (ref 39.0–52.0)
Hemoglobin: 12.9 g/dL — ABNORMAL LOW (ref 13.0–17.0)
MCH: 30.7 pg (ref 26.0–34.0)
MCHC: 34.4 g/dL (ref 30.0–36.0)
MCV: 89.3 fL (ref 78.0–100.0)
Platelets: 271 10*3/uL (ref 150–400)
RBC: 4.2 MIL/uL — ABNORMAL LOW (ref 4.22–5.81)
RDW: 12.8 % (ref 11.5–15.5)
WBC: 11.4 10*3/uL — ABNORMAL HIGH (ref 4.0–10.5)

## 2012-01-29 LAB — BASIC METABOLIC PANEL
BUN: 16 mg/dL (ref 6–23)
CO2: 26 mEq/L (ref 19–32)
Calcium: 8.3 mg/dL — ABNORMAL LOW (ref 8.4–10.5)
Chloride: 102 mEq/L (ref 96–112)
Creatinine, Ser: 0.96 mg/dL (ref 0.50–1.35)
GFR calc Af Amer: >90 mL/min (ref >90)
GFR calc non Af Amer: 86 mL/min — ABNORMAL LOW (ref >90)
Glucose, Bld: 128 mg/dL — ABNORMAL HIGH (ref 70–99)
Potassium: 3.9 mEq/L (ref 3.5–5.1)
Sodium: 136 mEq/L (ref 135–145)

## 2012-01-29 MED ORDER — ASPIRIN EC 325 MG PO TBEC: 325.0000 mg | DELAYED_RELEASE_TABLET | Freq: Two times a day (BID) | ORAL | Status: DC

## 2012-01-29 MED ORDER — DSS 100 MG PO CAPS: 100.0000 mg | ORAL_CAPSULE | Freq: Two times a day (BID) | ORAL | Status: DC

## 2012-01-29 MED ORDER — FERROUS SULFATE 325 (65 FE) MG PO TABS: 325.0000 mg | ORAL_TABLET | Freq: Three times a day (TID) | ORAL | Status: DC

## 2012-01-29 MED ORDER — METHOCARBAMOL 500 MG PO TABS: 500.0000 mg | ORAL_TABLET | Freq: Four times a day (QID) | ORAL | Status: DC | PRN

## 2012-01-29 MED ORDER — HYDROCODONE-ACETAMINOPHEN 7.5-325 MG PO TABS: 1.0000 | ORAL_TABLET | ORAL | Status: DC | PRN

## 2012-01-29 MED ORDER — POLYETHYLENE GLYCOL 3350 17 G PO PACK: 17.0000 g | PACK | Freq: Every day | ORAL | Status: DC | PRN

## 2012-01-29 MED ORDER — OXYCODONE HCL 5 MG PO TABS: 5.0000 mg | ORAL_TABLET | ORAL | Status: DC | PRN

## 2012-01-29 MED ORDER — OXYCODONE HCL 5 MG PO TABS
5.0000 mg | ORAL_TABLET | ORAL | Status: DC | PRN
  Administered 2012-01-29 (×2): 5 mg via ORAL
  Filled 2012-01-29 (×2): qty 1

## 2012-01-29 NOTE — Progress Notes (Signed)
CSW consulted for SNF placement. PN reviewed. PT recommends HHPT . Pt plans to return home following hospital d/c. RNCM will assist with d/c needs.  Cori Razor LCSW [phone]

## 2012-01-29 NOTE — Evaluation (Signed)
Occupational Therapy Evaluation Patient Details Name: Chris Obrien MRN: [id number] DOB: November 01, 1950 Today's Date: 01/29/2012 Time: 5784-6962 OT Time Calculation (min): 25 min  OT Assessment / Plan / Recommendation Clinical Impression  Pt is recovering from R TKA.  All education completed, pt requiring supervision for mobility and very minimal assist for ADL.  Will have wife available to assist at home.  Needs a collapsible 3 in 1 to be able to travel.    OT Assessment  Patient does not need any further OT services    Follow Up Recommendations  No OT follow up    Barriers to Discharge      Equipment Recommendations  3 in 1 bedside comode (collapsible)    Recommendations for Other Services    Frequency       Precautions / Restrictions Precautions Precautions: Knee Required Braces or Orthoses: Knee Immobilizer - Right Knee Immobilizer - Right: Discontinue once straight leg raise with < 10 degree lag   Pertinent Vitals/Pain R knee, did not rate, described as "not too bad", ice applied    ADL  Eating/Feeding: Independent Where Assessed - Eating/Feeding: Chair Grooming: Supervision/safety Where Assessed - Grooming: Unsupported standing Upper Body Bathing: Set up Where Assessed - Upper Body Bathing: Unsupported sitting Lower Body Bathing: Supervision/safety Where Assessed - Lower Body Bathing: Supported sit to stand;Unsupported sitting Upper Body Dressing: Set up Where Assessed - Upper Body Dressing: Unsupported sitting Lower Body Dressing: Minimal assistance Where Assessed - Lower Body Dressing: Unsupported sitting;Supported sit to stand Toilet Transfer: Supervision/safety Toilet Transfer Method: Sit to Barista: Raised toilet seat with arms (or 3-in-1 over toilet) Toileting - Clothing Manipulation and Hygiene: Supervision/safety Where Assessed - Engineer, mining and Hygiene: Sit to stand from 3-in-1 or toilet Equipment Used:  Long-handled shoe horn;Long-handled sponge;Reacher;Rolling walker;Gait belt;Sock aid Transfers/Ambulation Related to ADLs: supervision ADL Comments: Pt nearly able to access foot to donn R sock, will have wife available to assist.  Instructed to dress R LE first, then L due to decreased ROM. Educated on availability of AE for LB ADL.    OT Diagnosis:    OT Problem List:   OT Treatment Interventions:     OT Goals    Visit Information  Last OT Received On: 01/29/12 Assistance Needed: +1    Subjective Data  Subjective: "I need a collapsable 3 in 1."   Patient Stated Goal: Pt with plans to take a trip to [facility]   Prior Functioning     Home Living Lives With: Spouse Available Help at Discharge: Family Type of Home: House Home Access: Stairs to enter Secretary/administrator of Steps: 1/2 Home Layout: One level Bathroom Shower/Tub: Health visitor: Handicapped height Home Adaptive Equipment: None Additional Comments: Pt has access to a reacher, if needed. Prior Function Level of Independence: Independent Able to Take Stairs?: Yes Driving: Yes Vocation: Full time employment Comments: Works at [employer]. Communication Communication: No difficulties Dominant Hand: Right         Vision/Perception     Cognition  Overall Cognitive Status: Appears within functional limits for tasks assessed/performed Arousal/Alertness: Awake/alert Orientation Level: Appears intact for tasks assessed Behavior During Session: [facility] for tasks performed    Extremity/Trunk Assessment Right Upper Extremity Assessment RUE ROM/Strength/Tone: Within functional levels RUE Coordination: WFL - gross/fine motor Left Upper Extremity Assessment LUE ROM/Strength/Tone: Within functional levels LUE Coordination: WFL - gross/fine motor     Mobility Bed Mobility Bed Mobility: Not assessed (pt up in chair) Transfers Transfers:  Sit to Stand;Stand to Sit Sit to Stand: 5:  Supervision;With upper extremity assist;From chair/3-in-1 Stand to Sit: 5: Supervision;With upper extremity assist;To chair/3-in-1 Details for Transfer Assistance: Instructed on multiple uses of 3 in 1.     Shoulder Instructions     Exercise     Balance     End of Session OT - End of Session Activity Tolerance: Patient tolerated treatment well Patient left: in chair;with call bell/phone within reach;with family/visitor present  GO     Evern Bio 01/29/2012, 10:36 AM [phone]

## 2012-01-29 NOTE — Discharge Summary (Signed)
Physician Discharge Summary  Patient ID: BRYSIN TOWERY MRN: [id number] DOB/AGE: 64-Nov-1952 64 y.o.  Admit date: 01/28/2012 Discharge date:  01/29/2012  Procedures:  Procedure(s) (LRB): TOTAL KNEE ARTHROPLASTY (Right)  Attending Physician:  Dr. Durene Romans   Admission Diagnoses:   Right knee OA / pain  Discharge Diagnoses:  Principal Problem:  *S/P right TKA Active Problems:  Expected blood loss anemia  Obesity (BMI 30.0-34.9) Benign neoplasm of colon  Cardiac dysrhythmia, unspecified   Insomnia, unspecified   Oligospermia   Benign prostatic hypertrophy    HPI:   Chris Obrien, 62 y.o. male, has a history of pain and functional disability in the right knee due to arthritis and has failed non-surgical conservative treatments for greater than 12 weeks to includeNSAID's and/or analgesics, use of assistive devices and activity modification. Onset of symptoms was gradual, starting >10 years ago with gradually worsening course since that time. The patient noted prior procedures on the knee to include arthroscopy x3, last being in August of 2012, on the right knee(s). Patient currently rates pain in the right knee(s) at 8 out of 10 with activity. Patient has night pain, worsening of pain with activity and weight bearing, pain that interferes with activities of daily living, pain with passive range of motion, crepitus and joint swelling. Patient has evidence of periarticular osteophytes and joint space narrowing by imaging studies. There is no active infection. Risks, benefits and expectations were discussed with the patient. Patient understand the risks, benefits and expectations and wishes to proceed with surgery.   PCP: Sonda Primes, MD   Discharged Condition: good  Hospital Course:  Patient underwent the above stated procedure on 01/28/2012. Patient tolerated the procedure well and brought to the recovery room in good condition and subsequently to the floor.  POD #1 BP: 159/84  ; Pulse: 73 ; Temp: 98.4 F (36.9 C) ; Resp: 16  Pt's foley was removed, as well as the hemovac drain removed. IV was changed to a saline lock. Patient reports pain as mild, pain well controlled. No events throughout the night. Ready to be discharged home after PT. Neurovascular intact, dorsiflexion/plantar flexion intact, incision: dressing C/D/I, no cellulitis present and compartment soft.   LABS  Basename  01/29/12 0457   HGB  12.9  HCT  37.5    Discharge Exam: General appearance: alert, cooperative and no distress Extremities: Homans sign is negative, no sign of DVT, no edema, redness or tenderness in the calves or thighs and no ulcers, gangrene or trophic changes  Disposition:   Home or Self Care with follow up in 2 weeks   Follow-up Information    Follow up with [name], MD. In 2 weeks.   Contact information:   [address] [place] [address] [phone]          Discharge Orders    Future Orders Please Complete By Expires   Diet - low sodium heart healthy      Call MD / Call 911      Comments:   If you experience chest pain or shortness of breath, CALL 911 and be transported to the hospital emergency room.  If you develope a fever above 101 F, pus (white drainage) or increased drainage or redness at the wound, or calf pain, call your surgeon's office.   Discharge instructions      Comments:   Maintain surgical dressing for 10-14 days, then replace with gauze and tape. Keep the area dry and clean until  follow up. Follow up in 2 weeks at [facility]. Call with any questions or concerns.   Constipation Prevention      Comments:   Drink plenty of fluids.  Prune juice may be helpful.  You may use a stool softener, such as Colace (over the counter) 100 mg twice a day.  Use MiraLax (over the counter) for constipation as needed.   Increase activity slowly as tolerated      Weight bearing as tolerated      TED hose      Comments:   Use  stockings (TED hose) for 2 weeks on both leg(s).  You may remove them at night for sleeping.   Change dressing      Comments:   Maintain surgical dressing for 10-14 days, then change the dressing daily with sterile 4 x 4 inch gauze dressing and tape. Keep the area dry and clean.   Driving restrictions      Comments:   No driving for 4 weeks      Current Discharge Medication List    START taking these medications   Details  aspirin EC 325 MG tablet Take 1 tablet (325 mg total) by mouth 2 (two) times daily. X 4 weeks Qty: 60 tablet, Refills: 0    docusate sodium 100 MG CAPS Take 100 mg by mouth 2 (two) times daily. Qty: 10 capsule    ferrous sulfate 325 (65 FE) MG tablet Take 1 tablet (325 mg total) by mouth 3 (three) times daily after meals.    methocarbamol (ROBAXIN) 500 MG tablet Take 1 tablet (500 mg total) by mouth every 6 (six) hours as needed.    oxyCODONE (OXY IR/ROXICODONE) 5 MG immediate release tablet Take 1-2 tablets (5-10 mg total) by mouth every 4 (four) hours as needed for pain. Qty: 120 tablet, Refills: 0    polyethylene glycol (MIRALAX / GLYCOLAX) packet Take 17 g by mouth daily as needed. Qty: 14 each      CONTINUE these medications which have NOT CHANGED   Details  folic acid (FOLVITE) 1 MG tablet Take 1 mg by mouth daily.    guaiFENesin (MUCINEX) 600 MG 12 hr tablet Take 600 mg by mouth 2 (two) times daily.    Multiple Vitamin (MULTIVITAMIN WITH MINERALS) TABS Take 1 tablet by mouth daily.    orlistat (ALLI) 60 MG capsule Take 60 mg by mouth daily before breakfast.    Potassium 95 MG TBCR Take 95 mg by mouth daily.    Tamsulosin HCl (FLOMAX) 0.4 MG CAPS Take 0.4 mg by mouth daily.      glucosamine-chondroitin 500-400 MG tablet Take 1 tablet by mouth daily.        STOP taking these medications     ibuprofen (ADVIL,MOTRIN) 200 MG tablet Comments:  Reason for Stopping:             Signed: Anastasio Auerbach. Markham Dumlao   PAC  01/29/2012, 10:48 AM

## 2012-01-29 NOTE — Progress Notes (Signed)
   Subjective: 1 Day Post-Op Procedure(s) (LRB): TOTAL KNEE ARTHROPLASTY (Right)   Patient reports pain as mild, pain well controlled. No events throughout the night. Ready to be discharged home after PT.  Objective:   VITALS:   Filed Vitals:   01/29/12 0922  BP: 159/84  Pulse: 73  Temp: 98.4 F (36.9 C)  Resp: 16    Neurovascular intact Dorsiflexion/Plantar flexion intact Incision: dressing C/D/I No cellulitis present Compartment soft  LABS  Basename 01/29/12 0457  HGB 12.9*  HCT 37.5*  WBC 11.4*  PLT 271     Basename 01/29/12 0457  NA 136  K 3.9  BUN 16  CREATININE 0.96  GLUCOSE 128*     Assessment/Plan: 1 Day Post-Op Procedure(s) (LRB): TOTAL KNEE ARTHROPLASTY (Right) HV drain d/c'ed Foley cath d/c'ed Advance diet Up with therapy D/C IV fluids Discharge home with home health Follow up in 2 weeks at [facility] Follow up with OLIN,Ason Heslin D in 2 weeks.  Contact information:  [facility] [address] [place] [address] [phone]    Expected ABLA  Treated with iron and will observe  Obese (BMI 30-39.9) Estimated Body mass index is 31.40 kg/(m^2) as calculated from the following:   Height as of this encounter: 6\' 1" (1.854 m).   Weight as of this encounter: 238 lb(107.956 kg). Patient also counseled that weight may inhibit the healing process Patient counseled that losing weight will help with future health issues       Anastasio Auerbach. Avante Carneiro   PAC  01/29/2012, 10:42 AM

## 2012-01-29 NOTE — Progress Notes (Signed)
Physical Therapy Treatment Patient Details Name: Chris Obrien MRN: [id number] DOB: 28-Aug-1950 Today's Date: 01/29/2012 Time: 4098-1191 PT Time Calculation (min): 32 min  PT Assessment / Plan / Recommendation Comments on Treatment Session  Pt. is tolerating well, quads a little weaker today. Pt. hopeful to DC today .    Follow Up Recommendations  Home health PT     Does the patient have the potential to tolerate intense rehabilitation     Barriers to Discharge        Equipment Recommendations  Rolling walker with 5" wheels    Recommendations for Other Services    Frequency 7X/week   Plan Discharge plan remains appropriate;Frequency remains appropriate    Precautions / Restrictions Precautions Precautions: Knee Required Braces or Orthoses: Knee Immobilizer - Right Knee Immobilizer - Right: Discontinue once straight leg raise with < 10 degree lag   Pertinent Vitals/Pain 4 R knee    Mobility  Bed Mobility Bed Mobility: Not assessed (pt up in chair) Supine to Sit: 5: Supervision;HOB flat Transfers Sit to Stand: 5: Supervision;With upper extremity assist;From bed Stand to Sit: With armrests;5: Supervision Details for Transfer Assistance: cues for RLE placement. Ambulation/Gait Ambulation Distance (Feet): 150 Feet Assistive device: Rolling walker Ambulation/Gait Assistance Details: cues for sequence, position inside RW. slow pace. Gait Pattern: Step-to pattern;Antalgic;Trunk flexed Gait velocity: too fast    Exercises Total Joint Exercises Quad Sets: AROM;Right;10 reps Short Arc QuadBarbaraann Obrien;Right;10 reps;Supine Heel Slides: AAROM;Right;10 reps;Supine Straight Leg Raises: AAROM;Right;10 reps;Supine Knee Flexion: AAROM;Right;10 reps   PT Diagnosis:    PT Problem List:   PT Treatment Interventions:     PT Goals Acute Rehab PT Goals Pt will go Supine/Side to Sit: Independently PT Goal: Supine/Side to Sit - Progress: Progressing toward goal Pt will go Sit to  Stand: with supervision PT Goal: Sit to Stand - Progress: Progressing toward goal Pt will go Stand to Sit: with modified independence PT Goal: Stand to Sit - Progress: Progressing toward goal Pt will Ambulate: >150 feet;with supervision;with least restrictive assistive device PT Goal: Ambulate - Progress: Progressing toward goal Pt will Perform Home Exercise Program: Independently PT Goal: Perform Home Exercise Program - Progress: Progressing toward goal  Visit Information  Last PT Received On: 01/29/12 Assistance Needed: +1    Subjective Data  Subjective: I got no sleep   Cognition  Overall Cognitive Status: Appears within functional limits for tasks assessed/performed Arousal/Alertness: Awake/alert Orientation Level: Appears intact for tasks assessed Behavior During Session: [facility] for tasks performed    Balance     End of Session PT - End of Session Activity Tolerance: Patient tolerated treatment well Patient left: with call bell/phone within reach Nurse Communication: Mobility status   GP     Chris Obrien 01/29/2012, 11:08 AM

## 2012-01-29 NOTE — Care Management Note (Signed)
    Page 1 of 2   01/29/2012     2:52:35 PM   CARE MANAGEMENT NOTE 01/29/2012  Patient:  Chris Obrien, Chris Obrien   Account Number:  [id number]  Date Initiated:  01/29/2012  Documentation initiated by:  Colleen Can  Subjective/Objective Assessment:   dx rt knee osteoarethritis; total knee replacement     Action/Plan:   CM spoke with patient. Pplans are for patient to return to his home in [place] where spouse will be caregiver. Pt will need RW   Anticipated DC Date:  01/29/2012   Anticipated DC Plan:  HOME W HOME HEALTH SERVICES  In-house referral  NA      DC Planning Services  CM consult      PAC Choice  DURABLE MEDICAL EQUIPMENT  HOME HEALTH   Choice offered to / List presented to:  C-1 Patient   DME arranged  Levan Hurst      DME agency  [facility]     HH arranged  HH-2 PT      [facility] agency  [facility]   Status of service:  Completed, signed off Medicare Important Message given?  NO (If response is "NO", the following Medicare IM given date fields will be blank) Date Medicare IM given:   Date Additional Medicare IM given:    Discharge Disposition:  HOME W HOME HEALTH SERVICES  Per UR Regulation:  Reviewed for med. necessity/level of care/duration of stay  If discussed at Long Length of Stay Meetings, dates discussed:    Comments:  01/28/2012 Darliss Cheney BSN RN CCM [phone] [facility] will start HHPT services tomorrow 01/30/2012 RW has been delivered to pt's room.

## 2012-01-29 NOTE — Progress Notes (Signed)
Physical Therapy Treatment Patient Details Name: Chris Obrien MRN: [id number] DOB: 12-20-1950 Today's Date: 01/29/2012 Time: 7829-5621 PT Time Calculation (min): 22 min  PT Assessment / Plan / Recommendation Comments on Treatment Session  Pt's wife present for instruction. reviewed to use KI for getting into house, go up backwards for 1 STE. Pt is ready for DC.    Follow Up Recommendations  Home health PT     Does the patient have the potential to tolerate intense rehabilitation     Barriers to Discharge        Equipment Recommendations  Rolling walker with 5" wheels    Recommendations for Other Services    Frequency 7X/week   Plan Discharge plan remains appropriate;Frequency remains appropriate    Precautions / Restrictions Precautions Precautions: Knee Precaution Comments: discussed safety / pt and wife as pt is impulsive Required Braces or Orthoses: Knee Immobilizer - Left Knee Immobilizer - Right: Discontinue once straight leg raise with < 10 degree lag   Pertinent Vitals/Pain 4 sore    Mobility  Bed Mobility Supine to Sit: 5: Supervision;HOB flat Transfers Sit to Stand: 5: Supervision Stand to Sit: 5: Supervision Details for Transfer Assistance: cues for safety and RLE position. Ambulation/Gait Ambulation/Gait Assistance: 5: Supervision Ambulation Distance (Feet): 150 Feet Assistive device: Rolling walker Ambulation/Gait Assistance Details: cues forsafety and to use KI and to slow pace. Pt. ambulated w/out KI and will be safest ew it for DC to home. Gait Pattern: Step-to pattern;Trunk flexed;Antalgic Gait velocity: too fast General Gait Details: encouraged to take shorter step length    Exercises Total Joint Exercises Quad Sets: AROM;5 reps Short Arc Quad: AAROM;Right;10 reps;Supine Heel Slides: AROM;Right;10 reps Hip ABduction/ADduction: AROM;Right;10 reps Straight Leg Raises: AROM;Right;10 reps Knee Flexion: AROM;Right;10 reps   PT Diagnosis:    PT  Problem List:   PT Treatment Interventions:     PT Goals Acute Rehab PT Goals Pt will go Supine/Side to Sit: Independently PT Goal: Supine/Side to Sit - Progress: Progressing toward goal Pt will go Sit to Supine/Side: Independently PT Goal: Sit to Supine/Side - Progress: Progressing toward goal Pt will go Sit to Stand: Independently PT Goal: Sit to Stand - Progress: Progressing toward goal Pt will go Stand to Sit: with modified independence PT Goal: Stand to Sit - Progress: Progressing toward goal Pt will Ambulate: >150 feet;with rolling walker PT Goal: Ambulate - Progress: Met Pt will Go Up / Down Stairs: 1-2 stairs PT Goal: Up/Down Stairs - Progress:  (verbally reviewed.) Pt will Perform Home Exercise Program: with supervision, verbal cues required/provided PT Goal: Perform Home Exercise Program - Progress: Goal set today  Visit Information  Last PT Received On: 01/29/12 Assistance Needed: +1    Subjective Data  Subjective: i am ready.   Cognition  Overall Cognitive Status: Appears within functional limits for tasks assessed/performed Behavior During Session: Anxious    Balance     End of Session PT - End of Session Activity Tolerance: Patient tolerated treatment well Patient left: with call bell/phone within reach;with family/visitor present Nurse Communication: Mobility status (ready for DC)   GP     [name] 01/29/2012, 2:17 PM

## 2012-03-26 ENCOUNTER — Other Ambulatory Visit (INDEPENDENT_AMBULATORY_CARE_PROVIDER_SITE_OTHER): Payer: PRIVATE HEALTH INSURANCE

## 2012-03-26 ENCOUNTER — Ambulatory Visit (INDEPENDENT_AMBULATORY_CARE_PROVIDER_SITE_OTHER): Payer: PRIVATE HEALTH INSURANCE | Admitting: Internal Medicine

## 2012-03-26 ENCOUNTER — Encounter: Payer: Self-pay | Admitting: Internal Medicine

## 2012-03-26 VITALS — BP 128/78 | HR 91 | Temp 99.2°F | Ht 73.0 in | Wt 240.0 lb

## 2012-03-26 DIAGNOSIS — L0390 Cellulitis, unspecified: Secondary | ICD-10-CM

## 2012-03-26 DIAGNOSIS — L255 Unspecified contact dermatitis due to plants, except food: Secondary | ICD-10-CM

## 2012-03-26 DIAGNOSIS — L237 Allergic contact dermatitis due to plants, except food: Secondary | ICD-10-CM

## 2012-03-26 DIAGNOSIS — L0291 Cutaneous abscess, unspecified: Secondary | ICD-10-CM

## 2012-03-26 LAB — CBC
HCT: 45.4 % (ref 39.0–52.0)
Hemoglobin: 15.4 g/dL (ref 13.0–17.0)
MCHC: 33.9 g/dL (ref 30.0–36.0)
MCV: 89.5 fl (ref 78.0–100.0)
Platelets: 264 10*3/uL (ref 150.0–400.0)
RBC: 5.07 Mil/uL (ref 4.22–5.81)
RDW: 13.6 % (ref 11.5–14.6)
WBC: 18.9 10*3/uL (ref 4.5–10.5)

## 2012-03-26 LAB — BASIC METABOLIC PANEL
BUN: 20 mg/dL (ref 6–23)
CO2: 27 mEq/L (ref 19–32)
Calcium: 8.9 mg/dL (ref 8.4–10.5)
Chloride: 101 mEq/L (ref 96–112)
Creatinine, Ser: 1.5 mg/dL (ref 0.4–1.5)
GFR: 48.62 mL/min — ABNORMAL LOW (ref >60.00)
Glucose, Bld: 117 mg/dL — ABNORMAL HIGH (ref 70–99)
Potassium: 4.3 mEq/L (ref 3.5–5.1)
Sodium: 135 mEq/L (ref 135–145)

## 2012-03-26 LAB — SEDIMENTATION RATE: Sed Rate: 12 mm/hr (ref 0–22)

## 2012-03-26 MED ORDER — CEFTRIAXONE SODIUM 1 G IJ SOLR
1.0000 g | Freq: Once | INTRAMUSCULAR | Status: AC
  Administered 2012-03-26: 1 g via INTRAMUSCULAR

## 2012-03-26 MED ORDER — METHYLPREDNISOLONE ACETATE 80 MG/ML IJ SUSP
80.0000 mg | Freq: Once | INTRAMUSCULAR | Status: AC
  Administered 2012-03-26: 80 mg via INTRAMUSCULAR

## 2012-03-26 MED ORDER — PREDNISONE 10 MG PO TABS: ORAL_TABLET | ORAL | Status: DC

## 2012-03-26 MED ORDER — METHYLPREDNISOLONE ACETATE 40 MG/ML IJ SUSP
40.0000 mg | Freq: Once | INTRAMUSCULAR | Status: AC
  Administered 2012-03-26: 40 mg via INTRAMUSCULAR

## 2012-03-26 MED ORDER — SULFAMETHOXAZOLE-TRIMETHOPRIM 800-160 MG PO TABS: 1.0000 | ORAL_TABLET | Freq: Two times a day (BID) | ORAL | Status: DC

## 2012-03-26 NOTE — Progress Notes (Signed)
Subjective:    Patient ID: Chris Obrien, male    DOB: 07/08/1950, 62 y.o.   MRN: [id number]  HPI  Pt presents to the clinic today with c/o a rash on his left arm and right knee. This started approximately 10 days but has gotten remarkably worse over the last day or two. 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 He has been walking with a cane for the past 2 days. He believes the rash is poison ivy which he got from helping his son clean up trees from the snow storm. The rash is red, vesicular and very itchy. He has not taken anything for the itching or put any cream on it. He has been scratching the area on his right knee which has left the skin open. He does reports increased heart rate, fatigue, fever and occasional dizziness.   Review of Systems      Past Medical History  Diagnosis Date  . Benign neoplasm of colon   . Insomnia, unspecified   . Oligospermia   . Arthritis   . Sleep apnea     USES C- PAP    Current Outpatient Prescriptions  Medication Sig Dispense Refill  . folic acid (FOLVITE) 1 MG tablet Take 1 mg by mouth daily.      [name] glucosamine-chondroitin 500-400 MG tablet Take 1 tablet by mouth daily.        . Multiple Vitamin (MULTIVITAMIN WITH MINERALS) TABS Take 1 tablet by mouth daily.      [name] orlistat (ALLI) 60 MG capsule Take 60 mg by mouth daily before breakfast.      . Potassium 95 MG TBCR Take 95 mg by mouth as needed.       . Tamsulosin HCl (FLOMAX) 0.4 MG CAPS Take 0.4 mg by mouth daily.        . predniSONE (DELTASONE) 10 MG tablet Take 3 tabs on days 1-3, take 2 tabs on days 4-6, take 1 tab on days 7-9  18 tablet  0  . sulfamethoxazole-trimethoprim (BACTRIM DS,SEPTRA DS) 800-160 MG per tablet Take 1 tablet by mouth 2 (two) times daily.  28 tablet  0   No current facility-administered medications for this visit.    No Known Allergies  Family History  Problem Relation Age of Onset  .  Hyperlipidemia    . Alzheimer's disease Mother   . Arthritis Father   . Diabetes Brother     History   Social History  . Marital Status: Married    Spouse Name: N/A    Number of Children: N/A  . Years of Education: N/A   Occupational History  . manager     [employer]   Social History Main Topics  . Smoking status: Former Smoker    Types: Cigars    Quit date: 01/08/2008  . Smokeless tobacco: Never Used  . Alcohol Use: Yes     Comment: occasionally  . Drug Use: No  . Sexually Active: Not on file   Other Topics Concern  . Not on file   Social History Narrative  . No narrative on file     Constitutional: Pt reports fatigue and fever. Denies  malaise, headache or abrupt weight changes.  Respiratory: Denies difficulty breathing, shortness of breath, cough or sputum production.   Cardiovascular: Denies chest pain, chest tightness, palpitations or swelling in the hands or feet.  Musculoskeletal: Pt reports difficulty bending  his right knee. Denies decrease in range of motion, difficulty with gait, muscle pain or joint pain and swelling.  Skin: Pt reports rash on left arm and right knee, surrounded by swelling and redness. Denies redness, rashes, lesions or ulcercations.  Neurological: Pt reports dizziness. Denies dizziness, difficulty with memory, difficulty with speech or problems with balance and coordination.   No other specific complaints in a complete review of systems (except as listed in HPI above).   Objective:   Physical Exam  BP 128/78  Pulse 91  Temp(Src) 99.2 F (37.3 C) (Oral)  Ht 6\' 1"  (1.854 m)  Wt 240 lb (108.863 kg)  BMI 31.67 kg/m2  SpO2 96% Wt Readings from Last 3 Encounters:  03/26/12 240 lb (108.863 kg)  01/28/12 238 lb (107.956 kg)  01/28/12 238 lb (107.956 kg)    General: Appears their stated age, well developed, well nourished in NAD. Skin: small patch of vesicular lesions noted on left wrist, consistent with poison ivy. Marked  cellulitis and swelling of the right knee.  Cardiovascular: Normal rate and rhythm. S1,S2 noted.  No murmur, rubs or gallops noted. No JVD or BLE edema. No carotid bruits noted. Pulmonary/Chest: Normal effort and positive vesicular breath sounds. No respiratory distress. No wheezes, rales or ronchi noted.  Musculoskeletal:Decreased flexion and extension of the right knee secondary to pain. Large amounts of joint swelling of the knee. Mild gait impairment.  Neurological: Alert and oriented. Cranial nerves II-XII intact. Coordination normal. +DTRs bilaterally.        Assessment & Plan:   Poison Ivy, left wrist, new onset:  120 mg Depo Medrol IM today  Cellulitis of right knee, concerning for septic joint or hardware, new onset with additional workup required:  120 mg Depo Medrol IM today 1 g rocephin IM today eRx for pred taper eRx for Septra x 2 weeks Will obtain CBC, BMET. Esr and blood cultures. I will call your orthopedist to have him evaluate you today or tomorrow  RTC o nMonday. ER if not better or symptoms worse

## 2012-03-26 NOTE — Patient Instructions (Signed)
Cellulitis Cellulitis is an infection of the skin and the tissue beneath it. The infected area is usually red and tender. Cellulitis occurs most often in the arms and lower legs.   CAUSES   Cellulitis is caused by bacteria that enter the skin through cracks or cuts in the skin. The most common types of bacteria that cause cellulitis are Staphylococcus and Streptococcus. SYMPTOMS    Redness and warmth.   Swelling.   Tenderness or pain.   Fever.  DIAGNOSIS  Your caregiver can usually determine what is wrong based on a physical exam. Blood tests may also be done. TREATMENT   Treatment usually involves taking an antibiotic medicine. HOME CARE INSTRUCTIONS    Take your antibiotics as directed. Finish them even if you start to feel better.   Keep the infected arm or leg elevated to reduce swelling.   Apply a warm cloth to the affected area up to 4 times per day to relieve pain.   Only take over-the-counter or prescription medicines for pain, discomfort, or fever as directed by your caregiver.   Keep all follow-up appointments as directed by your caregiver.  SEEK MEDICAL CARE IF:    You notice red streaks coming from the infected area.   Your red area gets larger or turns dark in color.   Your bone or joint underneath the infected area becomes painful after the skin has healed.   Your infection returns in the same area or another area.   You notice a swollen bump in the infected area.   You develop new symptoms.  SEEK IMMEDIATE MEDICAL CARE IF:    You have a fever.   You feel very sleepy.   You develop vomiting or diarrhea.   You have a general ill feeling (malaise) with muscle aches and pains.  MAKE SURE YOU:    Understand these instructions.   Will watch your condition.   Will get help right away if you are not doing well or get worse.  Document Released: 10/03/2004 Document Revised: 06/25/2011 Document Reviewed: 03/11/2011 [employer] Patient Information 2013  [employer]    

## 2012-03-30 ENCOUNTER — Other Ambulatory Visit: Payer: PRIVATE HEALTH INSURANCE

## 2012-03-30 ENCOUNTER — Encounter: Payer: Self-pay | Admitting: Internal Medicine

## 2012-03-30 ENCOUNTER — Ambulatory Visit (INDEPENDENT_AMBULATORY_CARE_PROVIDER_SITE_OTHER): Payer: PRIVATE HEALTH INSURANCE | Admitting: Internal Medicine

## 2012-03-30 VITALS — BP 128/82 | HR 76 | Temp 98.2°F | Ht 73.0 in | Wt 240.0 lb

## 2012-03-30 DIAGNOSIS — M25469 Effusion, unspecified knee: Secondary | ICD-10-CM

## 2012-03-30 DIAGNOSIS — L02419 Cutaneous abscess of limb, unspecified: Secondary | ICD-10-CM

## 2012-03-30 DIAGNOSIS — M25461 Effusion, right knee: Secondary | ICD-10-CM

## 2012-03-30 DIAGNOSIS — L03115 Cellulitis of right lower limb: Secondary | ICD-10-CM

## 2012-03-30 NOTE — Progress Notes (Signed)
Subjective:    Patient ID: Chris Obrien, male    DOB: [birth year], 62 y.o.   MRN: [id number]  HPI  Pt presents to the clinic today to f/u cellulitis of the right knee, secondary to contact dermatitis with poison ivy 4 days ago. He was given an injection of solu-medrol, and rocephin. He was placed on septra x 1 week and given a steroid dose pack, He also followed up with his orthopedist secondary to pain with walking and difficulty with gait. His orthopedist gave him an additional antibiotic but he is unsure of the name. The swelling and redness have gone down a little bit but it is still painful to walk on. He has not been running fevers or had nausea, vomiting or diarrhea. He has a f/u with his ortho doctor on thursday.   Review of Systems  Past Medical History  Diagnosis Date  . Benign neoplasm of colon   . Insomnia, unspecified   . Oligospermia   . Arthritis   . Sleep apnea     USES C- PAP    Current Outpatient Prescriptions  Medication Sig Dispense Refill  . folic acid (FOLVITE) 1 MG tablet Take 1 mg by mouth daily.      [name] glucosamine-chondroitin 500-400 MG tablet Take 1 tablet by mouth daily.        . Multiple Vitamin (MULTIVITAMIN WITH MINERALS) TABS Take 1 tablet by mouth daily.      [name] orlistat (ALLI) 60 MG capsule Take 60 mg by mouth daily before breakfast.      . Potassium 95 MG TBCR Take 95 mg by mouth as needed.       . predniSONE (DELTASONE) 10 MG tablet Take 3 tabs on days 1-3, take 2 tabs on days 4-6, take 1 tab on days 7-9  18 tablet  0  . sulfamethoxazole-trimethoprim (BACTRIM DS,SEPTRA DS) 800-160 MG per tablet Take 1 tablet by mouth 2 (two) times daily.  28 tablet  0  . Tamsulosin HCl (FLOMAX) 0.4 MG CAPS Take 0.4 mg by mouth daily.         No current facility-administered medications for this visit.    No Known Allergies  Family History  Problem Relation Age of Onset  . Hyperlipidemia    . Alzheimer's disease Mother   . Arthritis Father   . Diabetes  Brother     History   Social History  . Marital Status: Married    Spouse Name: N/A    Number of Children: N/A  . Years of Education: N/A   Occupational History  . manager     [employer]   Social History Main Topics  . Smoking status: Former Smoker    Types: Cigars    Quit date: 01/08/2008  . Smokeless tobacco: Never Used  . Alcohol Use: Yes     Comment: occasionally  . Drug Use: No  . Sexually Active: Not on file   Other Topics Concern  . Not on file   Social History Narrative  . No narrative on file     Constitutional: Denies fever, malaise, fatigue, headache or abrupt weight changes.  Musculoskeletal: Pt reports right knee pain, swelling and difficulty with gait. Denies decrease in range of motion,  muscle pain or joint pain and swelling.  Skin: Pt reports redness of right knee. Denies redness, rashes, lesions or ulcercations.    No other specific complaints in a complete review of systems (except as listed in HPI above).  Objective:   Physical Exam  BP 128/82  Pulse 76  Temp(Src) 98.2 F (36.8 C) (Oral)  Ht 6\' 1"  (1.854 m)  Wt 240 lb (108.863 kg)  BMI 31.67 kg/m2  SpO2 96% Wt Readings from Last 3 Encounters:  03/30/12 240 lb (108.863 kg)  03/26/12 240 lb (108.863 kg)  01/28/12 238 lb (107.956 kg)    General: Appears this stated age, well developed, well nourished in NAD. Skin: Improving cellulitis of the right knee. Cardiovascular: Normal rate and rhythm. S1,S2 noted.  No murmur, rubs or gallops noted. No JVD or BLE edema. No carotid bruits noted. Pulmonary/Chest: Normal effort and positive vesicular breath sounds. No respiratory distress. No wheezes, rales or ronchi noted.  Musculoskeletal: Decreased flexion and extension of the knee secondary to pain. Moderate effusion of the right knee. Mild with gait, using cane for support.    BMET    Component Value Date/Time   NA 135 03/26/2012 1133   K 4.3 03/26/2012 1133   CL 101 03/26/2012 1133    CO2 27 03/26/2012 1133   GLUCOSE 117* 03/26/2012 1133   BUN 20 03/26/2012 1133   CREATININE 1.5 03/26/2012 1133   CALCIUM 8.9 03/26/2012 1133   GFRNONAA 86* 01/29/2012 0457   GFRAA >90 01/29/2012 0457    Lipid Panel     Component Value Date/Time   CHOL 191 07/29/2007 0000   TRIG 190* 07/29/2007 0000   HDL 49.3 07/29/2007 0000   CHOLHDL 3.9 CALC 07/29/2007 0000   VLDL 38 07/29/2007 0000   LDLCALC 104* 07/29/2007 0000    CBC    Component Value Date/Time   WBC 18.9 cH* 03/26/2012 1133   RBC 5.07 03/26/2012 1133   HGB 15.4 03/26/2012 1133   HCT 45.4 03/26/2012 1133   PLT 264.0 03/26/2012 1133   MCV 89.5 03/26/2012 1133   MCH 30.7 01/29/2012 0457   MCHC 33.9 03/26/2012 1133   RDW 13.6 03/26/2012 1133   MONOABS 0.7 07/29/2007 0000   EOSABS 0.3 07/29/2007 0000   BASOSABS 0.0 07/29/2007 0000    Hgb A1C No results found for this basename: HGBA1C         Assessment & Plan:   Procedure Note:  Fluid removal from effusion of bursa, right knee:  The patient elects to proceed after verbal consent is obtained. the patient was informed of possible risks and complications prior to procedure. Using sterile technique throughout, 18 g needle inserted into the lateral aspect of the right knee, 60 mL infected fluid withdrawn, sample sent for culture. Area bandages with gauze and coband. The patient tolerated the procedure well. Ice 24-48h, then use heat thereafter as needed. Aftercare instructions provided.   Right knee cellulitis, improving, but effusion concerning for infection of knee joint or hardware:  Continue your pred pack and current antibiotics Will send fluid off for culture I have called and spoke with your ortho PA Clemencia Course. They will see you first thing Wednesday morning. If pain, redness, swelling gets worse, go to the ER immediately.

## 2012-03-30 NOTE — Patient Instructions (Signed)
Cellulitis Cellulitis is an infection of the skin and the tissue beneath it. The infected area is usually red and tender. Cellulitis occurs most often in the arms and lower legs.   CAUSES   Cellulitis is caused by bacteria that enter the skin through cracks or cuts in the skin. The most common types of bacteria that cause cellulitis are Staphylococcus and Streptococcus. SYMPTOMS    Redness and warmth.   Swelling.   Tenderness or pain.   Fever.  DIAGNOSIS  Your caregiver can usually determine what is wrong based on a physical exam. Blood tests may also be done. TREATMENT   Treatment usually involves taking an antibiotic medicine. HOME CARE INSTRUCTIONS    Take your antibiotics as directed. Finish them even if you start to feel better.   Keep the infected arm or leg elevated to reduce swelling.   Apply a warm cloth to the affected area up to 4 times per day to relieve pain.   Only take over-the-counter or prescription medicines for pain, discomfort, or fever as directed by your caregiver.   Keep all follow-up appointments as directed by your caregiver.  SEEK MEDICAL CARE IF:    You notice red streaks coming from the infected area.   Your red area gets larger or turns dark in color.   Your bone or joint underneath the infected area becomes painful after the skin has healed.   Your infection returns in the same area or another area.   You notice a swollen bump in the infected area.   You develop new symptoms.  SEEK IMMEDIATE MEDICAL CARE IF:    You have a fever.   You feel very sleepy.   You develop vomiting or diarrhea.   You have a general ill feeling (malaise) with muscle aches and pains.  MAKE SURE YOU:    Understand these instructions.   Will watch your condition.   Will get help right away if you are not doing well or get worse.  Document Released: 10/03/2004 Document Revised: 06/25/2011 Document Reviewed: 03/11/2011 [employer] Patient Information 2013  [employer]    

## 2012-04-01 ENCOUNTER — Encounter (HOSPITAL_BASED_OUTPATIENT_CLINIC_OR_DEPARTMENT_OTHER): Payer: Self-pay | Admitting: *Deleted

## 2012-04-01 NOTE — Progress Notes (Signed)
NPO AFTER MN. ARRIVES AT 0700. NEEDS HG.  PRE-OP ORDERS PENDING.

## 2012-04-02 ENCOUNTER — Encounter (HOSPITAL_BASED_OUTPATIENT_CLINIC_OR_DEPARTMENT_OTHER): Payer: Self-pay | Admitting: *Deleted

## 2012-04-02 ENCOUNTER — Observation Stay: Admit: 2012-04-02 | Payer: Self-pay | Admitting: Orthopedic Surgery

## 2012-04-02 ENCOUNTER — Encounter (HOSPITAL_BASED_OUTPATIENT_CLINIC_OR_DEPARTMENT_OTHER): Payer: Self-pay | Admitting: Anesthesiology

## 2012-04-02 ENCOUNTER — Ambulatory Visit (HOSPITAL_COMMUNITY): Payer: PRIVATE HEALTH INSURANCE

## 2012-04-02 ENCOUNTER — Encounter (HOSPITAL_COMMUNITY)
Admission: RE | Disposition: A | Discharge status: Home / Self Care | Payer: Self-pay | Source: Ambulatory Visit | Attending: Orthopedic Surgery

## 2012-04-02 ENCOUNTER — Ambulatory Visit (HOSPITAL_BASED_OUTPATIENT_CLINIC_OR_DEPARTMENT_OTHER): Payer: PRIVATE HEALTH INSURANCE | Admitting: Anesthesiology

## 2012-04-02 ENCOUNTER — Observation Stay (HOSPITAL_BASED_OUTPATIENT_CLINIC_OR_DEPARTMENT_OTHER)
Admission: RE | Admit: 2012-04-02 | Discharge: 2012-04-03 | Disposition: A | Discharge status: Home / Self Care | Payer: PRIVATE HEALTH INSURANCE | Source: Ambulatory Visit | Attending: Orthopedic Surgery | Admitting: Orthopedic Surgery

## 2012-04-02 DIAGNOSIS — M25469 Effusion, unspecified knee: Secondary | ICD-10-CM | POA: Insufficient documentation

## 2012-04-02 DIAGNOSIS — L255 Unspecified contact dermatitis due to plants, except food: Secondary | ICD-10-CM | POA: Insufficient documentation

## 2012-04-02 DIAGNOSIS — T622X1A Toxic effect of other ingested (parts of) plant(s), accidental (unintentional), initial encounter: Secondary | ICD-10-CM | POA: Insufficient documentation

## 2012-04-02 DIAGNOSIS — M7040 Prepatellar bursitis, unspecified knee: Secondary | ICD-10-CM | POA: Insufficient documentation

## 2012-04-02 DIAGNOSIS — Z96649 Presence of unspecified artificial hip joint: Secondary | ICD-10-CM | POA: Insufficient documentation

## 2012-04-02 DIAGNOSIS — E669 Obesity, unspecified: Secondary | ICD-10-CM | POA: Diagnosis present

## 2012-04-02 DIAGNOSIS — N400 Enlarged prostate without lower urinary tract symptoms: Secondary | ICD-10-CM | POA: Insufficient documentation

## 2012-04-02 DIAGNOSIS — G4733 Obstructive sleep apnea (adult) (pediatric): Secondary | ICD-10-CM | POA: Insufficient documentation

## 2012-04-02 DIAGNOSIS — L02419 Cutaneous abscess of limb, unspecified: Principal | ICD-10-CM | POA: Insufficient documentation

## 2012-04-02 DIAGNOSIS — M7041 Prepatellar bursitis, right knee: Secondary | ICD-10-CM | POA: Diagnosis present

## 2012-04-02 HISTORY — DX: Dependence on other enabling machines and devices: Z99.89

## 2012-04-02 HISTORY — PX: IRRIGATION AND DEBRIDEMENT KNEE: SHX5185

## 2012-04-02 HISTORY — DX: Local infection of the skin and subcutaneous tissue, unspecified: L08.9

## 2012-04-02 HISTORY — DX: Reiter's disease, unspecified site: M02.30

## 2012-04-02 HISTORY — DX: Obstructive sleep apnea (adult) (pediatric): G47.33

## 2012-04-02 HISTORY — DX: Benign prostatic hyperplasia without lower urinary tract symptoms: N40.0

## 2012-04-02 HISTORY — DX: Effusion, right knee: M25.461

## 2012-04-02 LAB — POCT HEMOGLOBIN-HEMACUE: Hemoglobin: 12.9 g/dL — ABNORMAL LOW (ref 13.0–17.0)

## 2012-04-02 LAB — BODY FLUID CULTURE: Gram Stain: NONE SEEN

## 2012-04-02 LAB — GRAM STAIN

## 2012-04-02 SURGERY — IRRIGATION AND DEBRIDEMENT KNEE
Anesthesia: General | Site: Knee | Laterality: Right | Wound class: Clean Contaminated

## 2012-04-02 MED ORDER — CELECOXIB 200 MG PO CAPS
200.0000 mg | ORAL_CAPSULE | Freq: Two times a day (BID) | ORAL | Status: DC
  Administered 2012-04-02 – 2012-04-03 (×3): 200 mg via ORAL
  Filled 2012-04-02 (×5): qty 1

## 2012-04-02 MED ORDER — MIDAZOLAM HCL 5 MG/5ML IJ SOLN
INTRAMUSCULAR | Status: DC | PRN
  Administered 2012-04-02: 2 mg via INTRAVENOUS

## 2012-04-02 MED ORDER — FENTANYL CITRATE 0.05 MG/ML IJ SOLN
25.0000 ug | INTRAMUSCULAR | Status: DC | PRN
  Administered 2012-04-02 (×2): 50 ug via INTRAVENOUS
  Filled 2012-04-02: qty 1

## 2012-04-02 MED ORDER — POLYETHYLENE GLYCOL 3350 17 G PO PACK
17.0000 g | PACK | Freq: Two times a day (BID) | ORAL | Status: DC
  Filled 2012-04-02: qty 1

## 2012-04-02 MED ORDER — LACTATED RINGERS IV SOLN
INTRAVENOUS | Status: DC
  Filled 2012-04-02: qty 1000

## 2012-04-02 MED ORDER — ASPIRIN 325 MG PO TBEC: 325.0000 mg | DELAYED_RELEASE_TABLET | Freq: Every day | ORAL | Status: DC

## 2012-04-02 MED ORDER — ONDANSETRON HCL 4 MG/2ML IJ SOLN
4.0000 mg | Freq: Four times a day (QID) | INTRAMUSCULAR | Status: DC | PRN
  Filled 2012-04-02: qty 2

## 2012-04-02 MED ORDER — PHENOL 1.4 % MT LIQD
1.0000 | OROMUCOSAL | Status: DC | PRN
  Filled 2012-04-02: qty 177

## 2012-04-02 MED ORDER — HYDROCODONE-ACETAMINOPHEN 7.5-325 MG PO TABS
1.0000 | ORAL_TABLET | ORAL | Status: DC | PRN
  Filled 2012-04-02: qty 2

## 2012-04-02 MED ORDER — VANCOMYCIN HCL 10 G IV SOLR
1500.0000 mg | Freq: Once | INTRAVENOUS | Status: AC
  Administered 2012-04-02: 1500 mg via INTRAVENOUS
  Filled 2012-04-02: qty 1500

## 2012-04-02 MED ORDER — DEXAMETHASONE SODIUM PHOSPHATE 4 MG/ML IJ SOLN
INTRAMUSCULAR | Status: DC | PRN
  Administered 2012-04-02: 10 mg via INTRAVENOUS

## 2012-04-02 MED ORDER — ASPIRIN EC 325 MG PO TBEC
325.0000 mg | DELAYED_RELEASE_TABLET | Freq: Every day | ORAL | Status: DC
  Administered 2012-04-03: 325 mg via ORAL
  Filled 2012-04-02 (×3): qty 1

## 2012-04-02 MED ORDER — ORLISTAT 60 MG PO CAPS: 60.0000 mg | ORAL_CAPSULE | Freq: Every day | ORAL | Status: DC

## 2012-04-02 MED ORDER — DSS 100 MG PO CAPS: 100.0000 mg | ORAL_CAPSULE | Freq: Two times a day (BID) | ORAL | Status: DC

## 2012-04-02 MED ORDER — ONDANSETRON HCL 4 MG/2ML IJ SOLN
INTRAMUSCULAR | Status: DC | PRN
  Administered 2012-04-02: 4 mg via INTRAVENOUS

## 2012-04-02 MED ORDER — SODIUM CHLORIDE 0.9 % IR SOLN
Status: DC | PRN
  Administered 2012-04-02: 3000 mL

## 2012-04-02 MED ORDER — SODIUM CHLORIDE 0.9 % IR SOLN
Status: DC | PRN
  Administered 2012-04-02: 10:00:00

## 2012-04-02 MED ORDER — LACTATED RINGERS IV SOLN
INTRAVENOUS | Status: DC
  Administered 2012-04-02 (×2): via INTRAVENOUS
  Administered 2012-04-02: 100 mL/h via INTRAVENOUS
  Filled 2012-04-02: qty 1000

## 2012-04-02 MED ORDER — LIDOCAINE HCL (CARDIAC) 20 MG/ML IV SOLN
INTRAVENOUS | Status: DC | PRN
  Administered 2012-04-02: 75 mg via INTRAVENOUS

## 2012-04-02 MED ORDER — METHOCARBAMOL 100 MG/ML IJ SOLN
500.0000 mg | Freq: Four times a day (QID) | INTRAVENOUS | Status: DC | PRN
  Filled 2012-04-02: qty 5

## 2012-04-02 MED ORDER — HYDROMORPHONE HCL PF 1 MG/ML IJ SOLN
0.5000 mg | INTRAMUSCULAR | Status: DC | PRN
  Filled 2012-04-02: qty 2

## 2012-04-02 MED ORDER — MENTHOL 3 MG MT LOZG
1.0000 | LOZENGE | OROMUCOSAL | Status: DC | PRN
  Filled 2012-04-02: qty 9

## 2012-04-02 MED ORDER — ZOLPIDEM TARTRATE 5 MG PO TABS
5.0000 mg | ORAL_TABLET | Freq: Every evening | ORAL | Status: DC | PRN
  Administered 2012-04-02: 5 mg via ORAL
  Filled 2012-04-02 (×2): qty 1

## 2012-04-02 MED ORDER — FERROUS SULFATE 325 (65 FE) MG PO TABS: 325.0000 mg | ORAL_TABLET | Freq: Three times a day (TID) | ORAL | Status: DC

## 2012-04-02 MED ORDER — DOCUSATE SODIUM 100 MG PO CAPS
100.0000 mg | ORAL_CAPSULE | Freq: Two times a day (BID) | ORAL | Status: DC
  Administered 2012-04-02 – 2012-04-03 (×3): 100 mg via ORAL
  Filled 2012-04-02: qty 1

## 2012-04-02 MED ORDER — FENTANYL CITRATE 0.05 MG/ML IJ SOLN
INTRAMUSCULAR | Status: DC | PRN
  Administered 2012-04-02: 50 ug via INTRAVENOUS
  Administered 2012-04-02: 25 ug via INTRAVENOUS
  Administered 2012-04-02 (×4): 50 ug via INTRAVENOUS
  Administered 2012-04-02: 25 ug via INTRAVENOUS

## 2012-04-02 MED ORDER — FLEET ENEMA 7-19 GM/118ML RE ENEM
1.0000 | ENEMA | Freq: Once | RECTAL | Status: AC | PRN
  Filled 2012-04-02: qty 1

## 2012-04-02 MED ORDER — SODIUM CHLORIDE 0.9 % IJ SOLN
10.0000 mL | INTRAMUSCULAR | Status: DC | PRN
  Administered 2012-04-03: 10 mL

## 2012-04-02 MED ORDER — VANCOMYCIN HCL 10 G IV SOLR
1500.0000 mg | INTRAVENOUS | Status: AC
  Administered 2012-04-03: 1500 mg via INTRAVENOUS
  Filled 2012-04-02: qty 1500

## 2012-04-02 MED ORDER — POLYETHYLENE GLYCOL 3350 17 G PO PACK: 17.0000 g | PACK | Freq: Two times a day (BID) | ORAL | Status: DC

## 2012-04-02 MED ORDER — VANCOMYCIN HCL 10 G IV SOLR: 1500.0000 mg | INTRAVENOUS | Status: DC

## 2012-04-02 MED ORDER — TAMSULOSIN HCL 0.4 MG PO CAPS
0.4000 mg | ORAL_CAPSULE | Freq: Every day | ORAL | Status: DC
  Administered 2012-04-03: 0.4 mg via ORAL
  Filled 2012-04-02 (×2): qty 1

## 2012-04-02 MED ORDER — RIFAMPIN 300 MG PO CAPS
300.0000 mg | ORAL_CAPSULE | Freq: Two times a day (BID) | ORAL | Status: DC
  Administered 2012-04-02 – 2012-04-03 (×3): 300 mg via ORAL
  Filled 2012-04-02 (×5): qty 1

## 2012-04-02 MED ORDER — ONDANSETRON HCL 4 MG PO TABS
4.0000 mg | ORAL_TABLET | Freq: Four times a day (QID) | ORAL | Status: DC | PRN
  Filled 2012-04-02: qty 1

## 2012-04-02 MED ORDER — ALUM & MAG HYDROXIDE-SIMETH 200-200-20 MG/5ML PO SUSP
30.0000 mL | ORAL | Status: DC | PRN
  Filled 2012-04-02: qty 30

## 2012-04-02 MED ORDER — FERROUS SULFATE 325 (65 FE) MG PO TABS
325.0000 mg | ORAL_TABLET | Freq: Three times a day (TID) | ORAL | Status: DC
  Administered 2012-04-02 – 2012-04-03 (×3): 325 mg via ORAL
  Filled 2012-04-02 (×7): qty 1

## 2012-04-02 MED ORDER — SODIUM CHLORIDE 0.9 % IV SOLN
INTRAVENOUS | Status: DC
  Administered 2012-04-02: 19:00:00 via INTRAVENOUS
  Filled 2012-04-02 (×2): qty 1000

## 2012-04-02 MED ORDER — DIPHENHYDRAMINE HCL 25 MG PO CAPS
25.0000 mg | ORAL_CAPSULE | Freq: Four times a day (QID) | ORAL | Status: DC | PRN
  Filled 2012-04-02: qty 1

## 2012-04-02 MED ORDER — RIFAMPIN 300 MG PO CAPS: 300.0000 mg | ORAL_CAPSULE | Freq: Two times a day (BID) | ORAL | Status: DC

## 2012-04-02 MED ORDER — METHOCARBAMOL 500 MG PO TABS
500.0000 mg | ORAL_TABLET | Freq: Four times a day (QID) | ORAL | Status: DC | PRN
  Administered 2012-04-02: 500 mg via ORAL
  Filled 2012-04-02 (×2): qty 1

## 2012-04-02 MED ORDER — PROPOFOL 10 MG/ML IV BOLUS
INTRAVENOUS | Status: DC | PRN
  Administered 2012-04-02: 250 mg via INTRAVENOUS

## 2012-04-02 MED ORDER — VANCOMYCIN HCL 10 G IV SOLR
2000.0000 mg | Freq: Once | INTRAVENOUS | Status: DC
  Filled 2012-04-02: qty 2000

## 2012-04-02 MED ORDER — VANCOMYCIN HCL 1000 MG IV SOLR
1500.0000 mg | INTRAVENOUS | Status: DC | PRN
  Administered 2012-04-02: 1500 mg via INTRAVENOUS

## 2012-04-02 MED ORDER — HYDROCODONE-ACETAMINOPHEN 7.5-325 MG PO TABS: 1.0000 | ORAL_TABLET | ORAL | Status: DC | PRN

## 2012-04-02 MED ORDER — CEFAZOLIN SODIUM-DEXTROSE 2-3 GM-% IV SOLR
2.0000 g | Freq: Once | INTRAVENOUS | Status: DC
  Filled 2012-04-02: qty 50

## 2012-04-02 MED ORDER — CEFAZOLIN SODIUM-DEXTROSE 2-3 GM-% IV SOLR
INTRAVENOUS | Status: DC | PRN
  Administered 2012-04-02: 2 g via INTRAVENOUS

## 2012-04-02 MED ORDER — BISACODYL 10 MG RE SUPP
10.0000 mg | Freq: Every day | RECTAL | Status: DC | PRN
  Filled 2012-04-02: qty 1

## 2012-04-02 SURGICAL SUPPLY — 48 items
BANDAGE ELASTIC 6 VELCRO ST LF (GAUZE/BANDAGES/DRESSINGS) ×2 IMPLANT
BANDAGE ESMARK 6X9 LF (GAUZE/BANDAGES/DRESSINGS) ×1 IMPLANT
BANDAGE GAUZE ELAST BULKY 4 IN (GAUZE/BANDAGES/DRESSINGS) ×2 IMPLANT
BLADE SURG 15 STRL LF DISP TIS (BLADE) ×1 IMPLANT
BLADE SURG 15 STRL SS (BLADE) ×2
BNDG CMPR 9X6 STRL LF SNTH (GAUZE/BANDAGES/DRESSINGS) ×1
BNDG COHESIVE 6X5 TAN NS LF (GAUZE/BANDAGES/DRESSINGS) ×2 IMPLANT
BNDG ESMARK 6X9 LF (GAUZE/BANDAGES/DRESSINGS) ×2
CANISTER SUCT LVC 12 LTR MEDI- (MISCELLANEOUS) ×1 IMPLANT
CANISTER SUCTION 2500CC (MISCELLANEOUS) ×1 IMPLANT
CLOTH BEACON ORANGE TIMEOUT ST (SAFETY) ×2 IMPLANT
COVER MAYO STAND STRL (DRAPES) ×2 IMPLANT
COVER TABLE BACK 60X90 (DRAPES) ×2 IMPLANT
CUFF TOURN SGL QUICK 18 (TOURNIQUET CUFF) IMPLANT
CUFF TOURN SGL QUICK 24 (TOURNIQUET CUFF)
CUFF TOURN SGL QUICK 34 (TOURNIQUET CUFF) ×2
CUFF TRNQT CYL 24X4X40X1 (TOURNIQUET CUFF) IMPLANT
CUFF TRNQT CYL 34X4X40X1 (TOURNIQUET CUFF) ×1 IMPLANT
DRAIN PENROSE 18X1/2 LTX STRL (DRAIN) IMPLANT
DRAPE EXTREMITY T 121X128X90 (DRAPE) ×2 IMPLANT
DRSG PAD ABDOMINAL 8X10 ST (GAUZE/BANDAGES/DRESSINGS) ×2 IMPLANT
DURAPREP 26ML APPLICATOR (WOUND CARE) ×2 IMPLANT
ELECT REM PT RETURN 9FT ADLT (ELECTROSURGICAL) ×2
ELECTRODE REM PT RTRN 9FT ADLT (ELECTROSURGICAL) ×1 IMPLANT
EVACUATOR 1/8 PVC DRAIN (DRAIN) ×1 IMPLANT
GAUZE SPONGE 4X4 12PLY STRL LF (GAUZE/BANDAGES/DRESSINGS) ×3 IMPLANT
GAUZE XEROFORM 5X9 LF (GAUZE/BANDAGES/DRESSINGS) ×2 IMPLANT
GLOVE BIOGEL PI IND STRL 7.5 (GLOVE) ×1 IMPLANT
GLOVE BIOGEL PI IND STRL 8 (GLOVE) ×1 IMPLANT
GLOVE BIOGEL PI INDICATOR 7.5 (GLOVE) ×1
GLOVE BIOGEL PI INDICATOR 8 (GLOVE) ×1
GLOVE ECLIPSE 8.0 STRL XLNG CF (GLOVE) IMPLANT
GLOVE ORTHO TXT STRL SZ7.5 (GLOVE) ×4 IMPLANT
GLOVE SURG ORTHO 8.0 STRL STRW (GLOVE) ×2 IMPLANT
GOWN BRE IMP PREV XXLGXLNG (GOWN DISPOSABLE) ×4 IMPLANT
GOWN STRL NON-REIN LRG LVL3 (GOWN DISPOSABLE) ×2 IMPLANT
HANDPIECE INTERPULSE COAX TIP (DISPOSABLE) ×2
PACK BASIN DAY SURGERY FS (CUSTOM PROCEDURE TRAY) ×2 IMPLANT
PAD CAST 4YDX4 CTTN HI CHSV (CAST SUPPLIES) ×1 IMPLANT
PADDING CAST COTTON 4X4 STRL (CAST SUPPLIES) ×2
SET HNDPC FAN SPRY TIP SCT (DISPOSABLE) ×1 IMPLANT
SPONGE GAUZE 4X4 12PLY (GAUZE/BANDAGES/DRESSINGS) ×2 IMPLANT
STOCKINETTE IMPERVIOUS LG (DRAPES) ×2 IMPLANT
SUT ETHILON 2 0 PS N (SUTURE) ×1 IMPLANT
SYR CONTROL 10ML LL (SYRINGE) ×2 IMPLANT
TOWEL OR 17X26 10 PK STRL BLUE (TOWEL DISPOSABLE) ×4 IMPLANT
TUBE CONNECTING 12X1/4 (SUCTIONS) ×2 IMPLANT
YANKAUER SUCT BULB TIP NO VENT (SUCTIONS) ×3 IMPLANT

## 2012-04-02 NOTE — Brief Op Note (Signed)
04/02/2012  12:12 PM  PATIENT:  Chris Obrien  62 y.o. male  PRE-OPERATIVE DIAGNOSIS: right knee acute reactive pre-patella bursitis  POST-OPERATIVE DIAGNOSIS:  right knee acute reactive pre-patella bursitis versus septic prepatellar bursitis  PROCEDURE:  Procedure(s): IRRIGATION AND DEBRIDEMENT RIGHT PRE-PATELLA   (Right) 3 inches sharply debrided with scalpel including skin, subcutaneous tissue, prepatellar buresectomy  SURGEON:  Surgeon(s) and Role:    * [name], MD - Primary  PHYSICIAN ASSISTANT:  Lanney Gins, PA-C  ANESTHESIA:   general  EBL:  Total I/O In: 1440 [P.O.:240; I.V.:850; IV Piggyback:350] Out: -   BLOOD ADMINISTERED:none  DRAINS: (1 medium) Hemovact drain(s) in the right knee prepatelalr space with  Suction Open   LOCAL MEDICATIONS USED:  NONE  SPECIMEN:  Source of Specimen:  right knee prepatellar space  DISPOSITION OF SPECIMEN:  PATHOLOGY  COUNTS:  YES  TOURNIQUET:   Total Tourniquet Time Documented: Thigh (Right) - 9 minutes Total: Thigh (Right) - 9 minutes   DICTATION: .Other Dictation: Dictation Number [id number]  PLAN OF CARE: Admit for overnight observation  PATIENT DISPOSITION:  PACU - hemodynamically stable.   Delay start of Pharmacological VTE agent (>24hrs) due to surgical blood loss or risk of bleeding: no

## 2012-04-02 NOTE — Plan of Care (Signed)
Problem: Diagnosis - Type of Surgery Goal: General Surgical Patient Education (See Patient Education module for education specifics) I and D right knee

## 2012-04-02 NOTE — Progress Notes (Signed)
Gm stain results received from lab.  Bethena Roys, RN informed  .  Keela to inform Dr. Charlann Boxer that results are complete.

## 2012-04-02 NOTE — Progress Notes (Signed)
Report given to Marni Griffon RN room [phone]

## 2012-04-02 NOTE — Transfer of Care (Signed)
  Immediate Anesthesia Transfer of Care Note  Patient: Chris Obrien  Procedure(s) Performed: Procedure(s) (LRB): IRRIGATION AND DEBRIDEMENT RIGHT PRE-PATELLA   (Right)  Patient Location: Patient transported to PACU with oxygen via face mask at 4 Liters / Min  Anesthesia Type: General  Level of Consciousness: awake and alert   Airway & Oxygen Therapy: Patient Spontanous Breathing and Patient connected to face mask oxygen  Post-op Assessment: Report given to PACU RN and Post -op Vital signs reviewed and stable  Post vital signs: Reviewed and stable  Dentition: Teeth and oropharynx remain in pre-op condition  Complications: No apparent anesthesia complications

## 2012-04-02 NOTE — Progress Notes (Addendum)
ANTIBIOTIC CONSULT NOTE - INITIAL  Pharmacy Consult for Vancomycin Indication: Reactive bursitis of R knee  Allergies  Allergen Reactions  . Oxycodone Rash    Patient Measurements: Height: 6\' 1"  (185.4 cm) Weight: 237 lb (107.502 kg) IBW/kg (Calculated) : 79.9 Adjusted Body Weight: 107.5kg  Vital Signs: Temp: 99.3 F (37.4 C) (03/27 1025) Temp src: Oral (03/27 0714) BP: 120/80 mmHg (03/27 1025) Pulse Rate: 77 (03/27 1025) Intake/Output from previous day:   Intake/Output from this shift: Total I/O In: 1440 [P.O.:240; I.V.:850; IV Piggyback:350] Out: -   Labs:  Recent Labs  04/02/12 0741  HGB 12.9*   Estimated Creatinine Clearance: 64.8 ml/min (by C-G formula based on Cr of 1.5). No results found for this basename: VANCOTROUGH, Leodis Binet, VANCORANDOM, GENTTROUGH, GENTPEAK, GENTRANDOM, TOBRATROUGH, TOBRAPEAK, TOBRARND, AMIKACINPEAK, AMIKACINTROU, AMIKACIN,  in the last 72 hours   Microbiology: Recent Results (from the past 720 hour(s))  BODY FLUID CULTURE     Status: None   Collection Time    03/30/12  1:08 PM      Result Value Range Status   Culture Rare STAPHYLOCOCCUS AUREUS   Final   GRAM STAIN WBC present-both PMN and Mononuclear   Final   GRAM STAIN No Organisms Seen   Final   Organism ID, Bacteria STAPHYLOCOCCUS AUREUS   Final   Comment: Critical Results Called to,Read Back By     and Verified With:     CYNTHIA @ 1147 03/31/12 BY KRAWS     Rifampin and Gentamicin should not be used as     single drugs for treatment of Staph infections.  GRAM STAIN     Status: None   Collection Time    04/02/12  9:45 AM      Result Value Range Status   Specimen Description ABSCESS RIGHT KNEE PREPATELLAR   Final   Special Requests PATIENT ON FOLLOWING AUGMENTIN SEPTRA   Final   Gram Stain     Final   Value: ABUNDANT     WBC PRESENT,BOTH PMN AND MONONUCLEAR     NO ORGANISMS SEEN     Gram Stain Report Called to,Read Back By and Verified With: D. ALLEN RN AT 1050 ON  03.27.14 BY SHUEA.   Report Status 04/02/2012 FINAL   Final    Medical History: Past Medical History  Diagnosis Date  . Benign neoplasm of colon   . Insomnia, unspecified   . Oligospermia   . OSA on CPAP   . Swelling of joint of right knee   . Right knee skin infection   . BPH (benign prostatic hypertrophy)     hx small stream  . Arthritis   . Acute reactive arthritis per pcp note from 03-30-2012    right pre-patella---  secondary contact dermitis with poison ivy exposure 03-26-2012    Assessment: 61 yoM about 8 weeks s/p RTK replacemt, doing well until contact dermatitis rxn to poison ivy.  Pt received 1 week of antibiotics (bactrim DS and amoxicillin) but still with significant prepatellar swelling, went to OR today for I/D and now will start IV vancomycin for 4 weeks.    Gram stain of wound culture from 3/24 shows rare staph aureus, pansensitive.  Gram stain 3/27: NGTD.  Renal:   SCr from 3/20 = 1.5, CrCl ~65 (N 51).    WBC: 18.9 from 3/20  Currently afebrile   Ordered ancef 2grams IV x 1 pre-op (not charted yet)  Pt also to receive rifampin 300mg  PO q 12 hours   Goal  of Therapy:  Vancomycin trough level 15-20 mcg/ml  Plan:  1.  Vancomycin 2grams IV x 1, then 1500mg  IV q 24 hours 2.  F/u Vancomycin trough at steady state 3.  F/u renal fxn, cultures, WBC, T, clinical course  Vicie Cech E 04/02/2012,12:51 PM

## 2012-04-02 NOTE — Anesthesia Preprocedure Evaluation (Addendum)
Anesthesia Evaluation  Patient identified by MRN, date of birth, ID band Patient awake    Reviewed: Allergy & Precautions, H&P , NPO status , Patient's Chart, lab work & pertinent test results  Airway Mallampati: III TM Distance: >3 FB Neck ROM: full    Dental no notable dental hx. (+) Teeth Intact and Dental Advisory Given   Pulmonary sleep apnea and Continuous Positive Airway Pressure Ventilation ,  breath sounds clear to auscultation  Pulmonary exam normal       Cardiovascular Exercise Tolerance: Good negative cardio ROS  Rhythm:regular Rate:Normal     Neuro/Psych negative neurological ROS  negative psych ROS   GI/Hepatic negative GI ROS, Neg liver ROS,   Endo/Other  negative endocrine ROS  Renal/GU negative Renal ROS  negative genitourinary   Musculoskeletal   Abdominal   Peds  Hematology negative hematology ROS (+)   Anesthesia Other Findings   Reproductive/Obstetrics negative OB ROS                          Anesthesia Physical Anesthesia Plan  ASA: III  Anesthesia Plan: General   Post-op Pain Management:    Induction: Intravenous  Airway Management Planned: LMA  Additional Equipment:   Intra-op Plan:   Post-operative Plan:   Informed Consent: I have reviewed the patients History and Physical, chart, labs and discussed the procedure including the risks, benefits and alternatives for the proposed anesthesia with the patient or authorized representative who has indicated his/her understanding and acceptance.   Dental Advisory Given  Plan Discussed with: CRNA and Surgeon  Anesthesia Plan Comments:         Anesthesia Quick Evaluation

## 2012-04-02 NOTE — Progress Notes (Signed)
Peripherally Inserted Central Catheter/Midline Placement  The IV Nurse has discussed with the patient and/or persons authorized to consent for the patient, the purpose of this procedure and the potential benefits and risks involved with this procedure.  The benefits include less needle sticks, lab draws from the catheter and patient may be discharged home with the catheter.  Risks include, but not limited to, infection, bleeding, blood clot (thrombus formation), and puncture of an artery; nerve damage and irregular heat beat.  Alternatives to this procedure were also discussed.  PICC/Midline Placement Documentation        Vevelyn Pat 04/02/2012, 5:09 PM

## 2012-04-02 NOTE — Anesthesia Procedure Notes (Signed)
Procedure Name: LMA Insertion Date/Time: 04/02/2012 7:22 AM Performed by: Fran Lowes Pre-anesthesia Checklist: Patient identified, Emergency Drugs available, Suction available and Patient being monitored Patient Re-evaluated:Patient Re-evaluated prior to inductionOxygen Delivery Method: Circle System Utilized Preoxygenation: Pre-oxygenation with 100% oxygen Intubation Type: IV induction Ventilation: Mask ventilation without difficulty LMA: LMA inserted LMA Size: 4.0 Number of attempts: 1 Airway Equipment and Method: bite block Placement Confirmation: positive ETCO2 Tube secured with: Tape Dental Injury: Teeth and Oropharynx as per pre-operative assessment

## 2012-04-02 NOTE — Anesthesia Postprocedure Evaluation (Signed)
  Anesthesia Post-op Note  Patient: Chris Obrien  Procedure(s) Performed: Procedure(s) (LRB): IRRIGATION AND DEBRIDEMENT RIGHT PRE-PATELLA   (Right)  Patient Location: PACU  Anesthesia Type: General  Level of Consciousness: awake and alert   Airway and Oxygen Therapy: Patient Spontanous Breathing  Post-op Pain: mild  Post-op Assessment: Post-op Vital signs reviewed, Patient's Cardiovascular Status Stable, Respiratory Function Stable, Patent Airway and No signs of Nausea or vomiting  Last Vitals:  Filed Vitals:   04/02/12 1025  BP: 120/80  Pulse: 77  Temp: 37.4 C  Resp: 16    Post-op Vital Signs: stable   Complications: No apparent anesthesia complications

## 2012-04-02 NOTE — H&P (Signed)
CC- Chris Obrien is a 62 y.o. male who presented with new onset right knee erythema, prepatellar swelling and pain.    HPI-  62 yo male about 8 weeks after otherwise successful right total knee replacement after contact dermatitis reaction to poison ivy.  Very significant initial erythematous reaction when first seen associated with larger prepatellar swelling.  After a week of PO antibiotics the erythema diminished by almost a half but still with larger prepatellar swelling.  No reports of fever or chills.  Had been doing very well.  Disappointed in current predicament.  Past Medical History  Diagnosis Date  . Benign neoplasm of colon   . Insomnia, unspecified   . Oligospermia   . OSA on CPAP   . Swelling of joint of right knee   . Right knee skin infection   . BPH (benign prostatic hypertrophy)     hx small stream  . Arthritis   . Acute reactive arthritis per pcp note from 03-30-2012    right pre-patella---  secondary contact dermitis with poison ivy exposure 03-26-2012    Past Surgical History  Procedure Laterality Date  . Knee arthroscopy Right aug 2012  . Ulnar nerve repair Right 2008  . 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 Authorizing Provider  amoxicillin (AMOXIL) 875 MG tablet Take 875 mg by mouth 2 (two) times daily.   Yes Historical Provider, MD  folic acid (FOLVITE) 1 MG tablet Take 1 mg by mouth daily.   Yes Historical Provider, MD  glucosamine-chondroitin 500-400 MG tablet Take 1 tablet by mouth daily.     Yes Historical Provider, MD  ibuprofen (ADVIL,MOTRIN) 200 MG tablet Take 200 mg by mouth every 6 (six) hours as needed for pain.   Yes Historical Provider, MD  Multiple Vitamin (MULTIVITAMIN WITH MINERALS) TABS Take 1 tablet by mouth daily.   Yes Historical  Provider, MD  orlistat (ALLI) 60 MG capsule Take 60 mg by mouth daily before breakfast.   Yes Historical Provider, MD  Potassium 95 MG TBCR Take 95 mg by mouth as needed.    Yes Historical Provider, MD  sulfamethoxazole-trimethoprim (BACTRIM DS,SEPTRA DS) 800-160 MG per tablet Take 1 tablet by mouth 2 (two) times daily. 03/26/12  Yes Nicki Reaper, NP  Tamsulosin HCl (FLOMAX) 0.4 MG CAPS Take 0.4 mg by mouth daily.     Yes Historical Provider, MD    soft tissue tenderness over anterior aspect of knee, normal contralateral knee exam, large prepatellar swelling with erythema diminished since inception of antibiotics orally  Physical Examination: General appearance - alert, well appearing, and in no distress Mental status - alert, oriented to person, place, and time Chest - clear to auscultation, no wheezes, rales or rhonchi, symmetric air entry Heart - normal rate, regular rhythm, normal S1, S2, no murmurs, rubs, clicks or gallops, normal rate and regular rhythm Abdomen - soft, nontender, nondistended, no masses or organomegaly Musculoskeletal - no joint tenderness, deformity or swelling, abnormal exam of right knee, see above for specifics Extremities - intact peripheral pulses, abnormal exam of right knee leg with reactive erythema, swelling of right lower extremity Skin - erythema around right knee   Asessment/Plan---   1. Reactive versus infected prepatellar bursitis as result of poison ivy contact dermatitis   Plan to OR  today for incisional/irrigational debridement of right knee bursa 23 hr OBS for IV antibiotics Possible PICC line for 4-6 weeks IV antibiotics, will discuss with Pharmacy

## 2012-04-03 ENCOUNTER — Encounter (HOSPITAL_BASED_OUTPATIENT_CLINIC_OR_DEPARTMENT_OTHER): Payer: Self-pay | Admitting: Orthopedic Surgery

## 2012-04-03 DIAGNOSIS — E669 Obesity, unspecified: Secondary | ICD-10-CM | POA: Diagnosis present

## 2012-04-03 LAB — CBC
HCT: 36.9 % — ABNORMAL LOW (ref 39.0–52.0)
Hemoglobin: 12.5 g/dL — ABNORMAL LOW (ref 13.0–17.0)
MCH: 30.4 pg (ref 26.0–34.0)
MCHC: 33.9 g/dL (ref 30.0–36.0)
MCV: 89.8 fL (ref 78.0–100.0)
Platelets: 382 10*3/uL (ref 150–400)
RBC: 4.11 MIL/uL — ABNORMAL LOW (ref 4.22–5.81)
RDW: 13.5 % (ref 11.5–15.5)
WBC: 8.7 10*3/uL (ref 4.0–10.5)

## 2012-04-03 LAB — BASIC METABOLIC PANEL
BUN: 23 mg/dL (ref 6–23)
CO2: 25 mEq/L (ref 19–32)
Calcium: 8.4 mg/dL (ref 8.4–10.5)
Chloride: 103 mEq/L (ref 96–112)
Creatinine, Ser: 1.08 mg/dL (ref 0.50–1.35)
GFR calc Af Amer: 82 mL/min — ABNORMAL LOW (ref >90)
GFR calc non Af Amer: 71 mL/min — ABNORMAL LOW (ref >90)
Glucose, Bld: 123 mg/dL — ABNORMAL HIGH (ref 70–99)
Potassium: 3.8 mEq/L (ref 3.5–5.1)
Sodium: 138 mEq/L (ref 135–145)

## 2012-04-03 MED ORDER — HEPARIN SOD (PORK) LOCK FLUSH 100 UNIT/ML IV SOLN
250.0000 [IU] | INTRAVENOUS | Status: DC | PRN
  Administered 2012-04-03: 250 [IU]
  Filled 2012-04-03: qty 3

## 2012-04-03 MED ORDER — HEPARIN SOD (PORK) LOCK FLUSH 100 UNIT/ML IV SOLN
250.0000 [IU] | Freq: Every day | INTRAVENOUS | Status: DC
  Filled 2012-04-03: qty 3

## 2012-04-03 MED ORDER — HYDROCODONE-ACETAMINOPHEN 7.5-325 MG PO TABS: 1.0000 | ORAL_TABLET | ORAL | Status: DC | PRN

## 2012-04-03 MED ORDER — VANCOMYCIN HCL IN DEXTROSE 1-5 GM/200ML-% IV SOLN
1000.0000 mg | Freq: Two times a day (BID) | INTRAVENOUS | Status: DC
  Filled 2012-04-03: qty 200

## 2012-04-03 MED ORDER — RIFAMPIN 300 MG PO CAPS: 300.0000 mg | ORAL_CAPSULE | Freq: Two times a day (BID) | ORAL | Status: DC

## 2012-04-03 NOTE — Progress Notes (Signed)
Received orders for rolling walker and commode.  Per Vaughan Basta, patient already has equipment. No DME needs at this time.

## 2012-04-03 NOTE — Progress Notes (Signed)
   Subjective: 1 Day Post-Op Procedure(s) (LRB): IRRIGATION AND DEBRIDEMENT RIGHT PRE-PATELLA   (Right)   Patient reports pain as mild, pain well controlled. He feels that the swelling and pain have reduced significantly. No events throughout the night. PICC was placed yesterday and he is tolerating it well.  Ready to be discharged home.  Objective:   VITALS:   Filed Vitals:   04/03/12 0426  BP: 137/79  Pulse: 78  Temp: 98.3 F (36.8 C)  Resp: 20    Neurovascular intact Dorsiflexion/Plantar flexion intact Incision: dressing C/D/I No cellulitis present Compartment soft  LABS  Recent Labs  04/02/12 0741 04/03/12 0347  HGB 12.9* 12.5*  HCT  --  36.9*  WBC  --  8.7  PLT  --  382     Recent Labs  04/03/12 0347  NA 138  K 3.8  BUN 23  CREATININE 1.08  GLUCOSE 123*     Assessment/Plan: 1 Day Post-Op Procedure(s) (LRB): IRRIGATION AND DEBRIDEMENT RIGHT PRE-PATELLA   (Right) Up with therapy Discharge home with home health Follow up in 2 weeks at [facility]. Follow up with OLIN,Zakai Gonyea D in 2 weeks.  Contact information:  [facility] [address] [place] [address] [phone]    Expected ABLA  Treated with iron and will observe  Obese (BMI 30-39.9) Estimated body mass index is 31.27 kg/(m^2) as calculated from the following:   Height as of this encounter: 6\' 1"  (1.854 m).   Weight as of this encounter: 107.502 kg (237 lb). Patient also counseled that weight may inhibit the healing process Patient counseled that losing weight will help with future health issues     Anastasio Auerbach. Librado Guandique   PAC  04/03/2012, 9:10 AM

## 2012-04-03 NOTE — Op Note (Signed)
Chris Obrien, Chris Obrien NO.:  [id number]  MEDICAL RECORD NO.:  [id number]  LOCATION:  1607                         FACILITY:  [facility]  PHYSICIAN:  Madlyn Frankel. Charlann Boxer, M.D.  DATE OF BIRTH:  1950/01/12  DATE OF PROCEDURE:  04/02/2012 DATE OF DISCHARGE:                              OPERATIVE REPORT   PREOPERATIVE DIAGNOSIS:  Reactive right knee prepatellar bursitis versus septic prepatellar bursitis following contact dermatitis related to poison ivy and severe cellulitic response.  POSTOPERATIVE DIAGNOSIS:  Reactive right knee prepatellar bursitis versus septic prepatellar bursitis following contact dermatitis related to poison ivy and severe cellulitic response.  PROCEDURE:  Incisional debridement of right knee including a 3-inch incision of skin and subcutaneous tissue, underlying bursectomy followed by irrigation with 3 L of normal saline solution.  SURGEON:  Madlyn Frankel. Charlann Boxer, M.D.  ASSISTANT:  Lanney Gins, PA-C.  Note that Mr. Chris Obrien was present for facilitation of the case, primary wound closure, and management of the upper extremity.  ANESTHESIA:  General.  SPECIMENS:  Fluid was sent from the prepatellar bursa noted to have some significant inflammatory reaction with a thin substrate as opposed to the thick purulence.  TOURNIQUET TIME:  9 minutes.  DRAINS:  One medium Hemovac drain placed into the prepatellar space.  INDICATIONS FOR PROCEDURE:  Mr. Chris Obrien is a very pleasant 62 year old gentleman with a history of right total hip replacement approximately 8 weeks ago.  He had done exceptionally well and returned to work.  He presented to the office about a week after he was helping his son to clear a poison ivy around the house with a severe reaction in both arms and his right knee.  At the initial evaluation, he had severe cellulitic response of his knee and we added an antibiotic to his Septra which was Augmentin.  He presented a week later with  persistent swelling and pain, no reports of fevers with nearly 50% reduction of cellulitis.  Given another line of total knee replacement and the findings on exam, I felt that it was in his best interest to proceed with open I and D of his knee.  I discussed with them also the utilization of IV antibiotics for 4-6 weeks to protect his knee and aggressively treat this knee with whether or not this was reactive or septic.  Consent was obtained for benefit of management of this reactive swelling.  Risks of infection and septic total knee arthroplasty reviewed as well as the possible treatment measures outlined.  PROCEDURE IN DETAIL:  The patient was brought to the operative theater. Once adequate anesthesia and preoperative antibiotics administered which included a 1.5 g of vancomycin and 2 g of Ancef, the patient was brought to the operating room.  His right thigh tourniquet was placed.  The right lower extremity was prepped and draped in sterile fashion.  A time- out was performed identifying the patient, planned procedure, and extremity.  The leg was elevated for exsanguination.  The tourniquet elevated to 250 mmHg.  As identified with him preoperatively, 3 or so inches of incision using the inferior aspect soft tissue planes created.  Skin was excised as well as subcutaneous tissue.  Once I  opened up the prepatellar space, cultures were taken.  I then completed the soft tissue dissection identifying the boundaries and limitations of this, which appeared to be all in this prepatellar space.  I evacuated the entire fibrinous tissue as well as the remaining fluid and then sharply debrided the bursal tissue using a rongeur as well as the Bovie.  I then irrigated the knee with 3 L normal saline solution with pulse lavage.  The tourniquet was let down after 9 minutes.  There was just a punctate bleeding oozing from his knee but nothing that was able to be adequately cauterized. Thus,  the Hemovac drain was placed deep.  I then reapproximated the skin edges with 2-0 nylon.  The knee was then cleaned, dried, and dressed sterilely using Xeroform and a bulky sterile wrap again with a Hemovac in place.  He was then brought to the recovery room in stable condition with plans to keep him overnight.  He will receive a PICC line today. We will place him on IV vancomycin for 4 weeks.  We will follow his cultures from the office and see if we can defer him over to another more bacterial cidal substrate.     Madlyn Frankel Charlann Boxer, M.D.     MDO/MEDQ  D:  04/02/2012  T:  04/03/2012  Job:  [id number]

## 2012-04-03 NOTE — Evaluation (Signed)
Physical Therapy Evaluation Patient Details Name: Chris Obrien MRN: [id number] DOB: 1950/04/18 Today's Date: 04/03/2012 Time: 1040-1050 PT Time Calculation (min): 10 min  PT Assessment / Plan / Recommendation Clinical Impression  62 yo male s/p I&D R knee, bursectomy 3/27. Hx of R TKA 01/2012. On eval, pt is independent with all activity. Noted ROM deficits and swelling R knee. Pt very motivated to return to PLOF. Familiar with exercises that he needs to resume. Discussed pt using stationary bike to help with cardio training and R knee ROM. No acute PT needs or follow up PT needs at this time. Encouraged pt to perform exercises within tolerance and to discuss possible need for OP PT after follow up at MD office. 1x eval. PT will sign off. Thanks.     PT Assessment  Patent does not need any further PT services    Follow Up Recommendations  No PT follow up    Does the patient have the potential to tolerate intense rehabilitation      Barriers to Discharge        Equipment Recommendations  None recommended by PT    Recommendations for Other Services     Frequency      Precautions / Restrictions Precautions Precautions: None Restrictions Weight Bearing Restrictions: No RLE Weight Bearing: Weight bearing as tolerated   Pertinent Vitals/Pain No c/o pain      Mobility  Bed Mobility Bed Mobility: Supine to Sit;Sit to Supine Supine to Sit: 7: Independent Sit to Supine: 7: Independent Transfers Transfers: Sit to Stand;Stand to Sit Sit to Stand: 7: Independent Stand to Sit: 7: Independent Ambulation/Gait Ambulation/Gait Assistance: 7: Independent Ambulation Distance (Feet): 150 Feet Assistive device: None Ambulation/Gait Assistance Details: Good gait speed and stride Gait Pattern: Step-through pattern;Right flexed knee in stance    Exercises     PT Diagnosis:    PT Problem List:   PT Treatment Interventions:     PT Goals    Visit Information  Last PT Received  On: 04/03/12 Assistance Needed: +1    Subjective Data  Subjective: Im good. I was about 95% recovered before this happened.  Patient Stated Goal: Return to PLOF   Prior Functioning  Home Living Lives With: Spouse Available Help at Discharge: Family Type of Home: House Home Layout: One level Bathroom Shower/Tub: Health visitor: Handicapped height Prior Function Level of Independence: Independent Able to Take Stairs?: Yes Driving: Yes Vocation: Full time employment Communication Communication: No difficulties    Cognition  Cognition Overall Cognitive Status: Appears within functional limits for tasks assessed/performed Arousal/Alertness: Awake/alert Orientation Level: Appears intact for tasks assessed Behavior During Session: [facility] for tasks performed    Extremity/Trunk Assessment Right Lower Extremity Assessment RLE ROM/Strength/Tone: Deficits RLE ROM/Strength/Tone Deficits: Decreased knee ROM ~10-95 degrees sitting EOB. Edema R knee RLE Sensation: WFL - Light Touch Left Lower Extremity Assessment LLE ROM/Strength/Tone: Within functional levels   Balance    End of Session PT - End of Session Activity Tolerance: Patient tolerated treatment well Patient left: in bed;with call bell/phone within reach  GP Functional Assessment Tool Used: clinical judgement Functional Limitation: Mobility: Walking and moving around Mobility: Walking and Moving Around Current Status (W0981): 0 percent impaired, limited or restricted Mobility: Walking and Moving Around Goal Status (X9147): 0 percent impaired, limited or restricted Mobility: Walking and Moving Around Discharge Status [phone]): 0 percent impaired, limited or restricted   Rebeca Alert, MPT Pager: [phone]

## 2012-04-03 NOTE — Discharge Summary (Signed)
Physician Discharge Summary  Patient ID: Chris Obrien MRN: [id number] DOB/AGE: [birth year] 62 y.o.  Admit date: 04/02/2012 Discharge date:  04/03/2012  Procedures:  Procedure(s) (LRB): IRRIGATION AND DEBRIDEMENT RIGHT PRE-PATELLA   (Right)  Attending Physician:  Dr. Durene Romans   Admission Diagnoses:   New onset right knee erythema, prepatellar swelling and pain  Discharge Diagnoses:  Principal Problem:   Right reactive prepatellar bursitis  Past Medical History  Diagnosis Date  . Benign neoplasm of colon   . Insomnia, unspecified   . Oligospermia   . OSA on CPAP   . Swelling of joint of right knee   . Right knee skin infection   . BPH (benign prostatic hypertrophy)     hx small stream  . Arthritis   . Acute reactive arthritis per pcp note from 03-30-2012    right pre-patella---  secondary contact dermitis with poison ivy exposure 03-26-2012    HPI:    62 yo male about 8 weeks after otherwise successful right total knee replacement after contact dermatitis reaction to poison ivy. Very significant initial erythematous reaction when first seen associated with larger prepatellar swelling. After a week of PO antibiotics the erythema diminished by almost a half but still with larger prepatellar swelling. No reports of fever or chills. Had been doing very well. Disappointed in current predicament.  PCP: Sonda Primes, MD   Discharged Condition: good  Hospital Course:  Patient underwent the above stated procedure on 04/02/2012. Patient tolerated the procedure well and brought to the recovery room in good condition and subsequently to the floor.  POD #1 BP: 137/79 ; Pulse: 78 ; Temp: 98.3 F (36.8 C) ; Resp: 20  Pt's foley was removed, as well as the hemovac drain removed. IV was changed to a saline lock. Patient reports pain as mild, pain well controlled. He feels that the swelling and pain have reduced significantly. No events throughout the night. PICC was placed  yesterday and he is tolerating it well. Ready to be discharged home.  Understands the need for 4 weeks of antibiotics. Neurovascular intact, dorsiflexion/plantar flexion intact, incision: dressing C/D/I and compartment soft.   LABS  Basename  04/03/12    0347   HGB  12.5  HCT  36.9    Discharge Exam: General appearance: alert, cooperative and no distress Extremities: Homans sign is negative, no sign of DVT  Disposition:  Home-Health Care Svc with follow up in 2 weeks. IV Vancomycin for 4 weeks.   Follow-up Information   Follow up with [name], MD. Schedule an appointment as soon as possible for a visit in 2 weeks.   Contact information:   [address] [place] [address] [phone]       Discharge Orders   Future Orders Complete By Expires     Call MD / Call 911  As directed     Comments:      If you experience chest pain or shortness of breath, CALL 911 and be transported to the hospital emergency room.  If you develope a fever above 101 F, pus (white drainage) or increased drainage or redness at the wound, or calf pain, call your surgeon's office.    Change dressing  As directed     Comments:      Change the dressing daily with sterile 4 x 4 inch gauze dressing and tape. Keep the area dry and clean.    Constipation Prevention  As directed     Comments:  Drink plenty of fluids.  Prune juice may be helpful.  You may use a stool softener, such as Colace (over the counter) 100 mg twice a day.  Use MiraLax (over the counter) for constipation as needed.    Diet - low sodium heart healthy  As directed     Discharge instructions  As directed     Comments:      Daily dressing changes with gauze and tape. Keep the area dry and clean until follow up. Follow up in 2 weeks at [facility]. Call with any questions or concerns.    Increase activity slowly as tolerated  As directed     TED hose  As directed     Comments:      Use stockings (TED  hose) for 2 weeks on both leg(s).  You may remove them at night for sleeping.    Weight bearing as tolerated  As directed          Medication List    STOP taking these medications       amoxicillin 875 MG tablet  Commonly known as:  AMOXIL     ibuprofen 200 MG tablet  Commonly known as:  ADVIL,MOTRIN     predniSONE 10 MG tablet  Commonly known as:  DELTASONE     sulfamethoxazole-trimethoprim 800-160 MG per tablet  Commonly known as:  BACTRIM DS,SEPTRA DS      TAKE these medications       aspirin 325 MG EC tablet  Take 1 tablet (325 mg total) by mouth daily with breakfast.     DSS 100 MG Caps  Take 100 mg by mouth 2 (two) times daily.     ferrous sulfate 325 (65 FE) MG tablet  Take 1 tablet (325 mg total) by mouth 3 (three) times daily after meals.     FLOMAX 0.4 MG Caps  Generic drug:  tamsulosin  Take 0.4 mg by mouth daily.     folic acid 1 MG tablet  Commonly known as:  FOLVITE  Take 1 mg by mouth daily.     glucosamine-chondroitin 500-400 MG tablet  Take 1 tablet by mouth daily.     HYDROcodone-acetaminophen 7.5-325 MG per tablet  Commonly known as:  NORCO  Take 1-2 tablets by mouth every 4 (four) hours as needed for pain.     multivitamin with minerals Tabs  Take 1 tablet by mouth daily.     orlistat 60 MG capsule  Commonly known as:  ALLI  Take 60 mg by mouth daily before breakfast.     polyethylene glycol packet  Commonly known as:  MIRALAX / GLYCOLAX  Take 17 g by mouth 2 (two) times daily.     Potassium 95 MG Tbcr  Take 95 mg by mouth as needed.     rifampin 300 MG capsule  Commonly known as:  RIFADIN  Take 1 capsule (300 mg total) by mouth every 12 (twelve) hours.     sodium chloride 0.9 % SOLN 500 mL with vancomycin 10 G SOLR 1,500 mg  Inject 1,500 mg into the vein daily.         Signed: Anastasio Auerbach. Jaxyn Mestas   PAC  04/03/2012, 3:07 PM

## 2012-04-03 NOTE — Progress Notes (Signed)
ANTIBIOTIC CONSULT NOTE - FOLLOW UP  Pharmacy Consult for Vancomycin Indication: Reactive bursitis of R knee   Allergies  Allergen Reactions  . Oxycodone Rash    Patient Measurements: Height: 6\' 1"  (185.4 cm) Weight: 237 lb (107.502 kg) IBW/kg (Calculated) : 79.9 Adjusted Body Weight:   Vital Signs: Temp: 98.3 F (36.8 C) (03/28 0426) Temp src: Oral (03/28 0426) BP: 137/79 mmHg (03/28 0426) Pulse Rate: 78 (03/28 0426) Intake/Output from previous day: 03/27 0701 - 03/28 0700 In: 3700 [P.O.:900; I.V.:1950; IV Piggyback:850] Out: 3025 [Urine:3025] Intake/Output from this shift: Total I/O In: 240 [P.O.:240] Out: -   Labs:  Recent Labs  04/02/12 0741 04/03/12 0347  WBC  --  8.7  HGB 12.9* 12.5*  PLT  --  382  CREATININE  --  1.08   Estimated Creatinine Clearance: 90 ml/min (by C-G formula based on Cr of 1.08). No results found for this basename: VANCOTROUGH, Leodis Binet, VANCORANDOM, GENTTROUGH, GENTPEAK, GENTRANDOM, TOBRATROUGH, TOBRAPEAK, TOBRARND, AMIKACINPEAK, AMIKACINTROU, AMIKACIN,  in the last 72 hours   Microbiology: Recent Results (from the past 720 hour(s))  BODY FLUID CULTURE     Status: None   Collection Time    03/30/12  1:08 PM      Result Value Range Status   Culture Rare STAPHYLOCOCCUS AUREUS   Final   GRAM STAIN WBC present-both PMN and Mononuclear   Final   GRAM STAIN No Organisms Seen   Final   Organism ID, Bacteria STAPHYLOCOCCUS AUREUS   Final   Comment: Critical Results Called to,Read Back By     and Verified With:     CYNTHIA @ 1147 03/31/12 BY KRAWS     Rifampin and Gentamicin should not be used as     single drugs for treatment of Staph infections.  CULTURE, ROUTINE-ABSCESS     Status: None   Collection Time    04/02/12  9:45 AM      Result Value Range Status   Specimen Description ABSCESS RIGHT KNEE PREPATELLAR   Final   Special Requests PATIENT ON FOLLOWING AUGMENTIN SEPTRA   Final   Gram Stain     Final   Value: ABUNDANT WBC  PRESENT,BOTH PMN AND MONONUCLEAR     NO ORGANISMS SEEN     Gram Stain Report Called to,Read Back By and Verified With: Gram Stain Report Called to,Read Back By and Verified With: D ALLEN RN AT 1050 04/02/12 BY SHUEA Performed by [facility]   Culture NO GROWTH 1 DAY   Final   Report Status PENDING   Incomplete  ANAEROBIC CULTURE     Status: None   Collection Time    04/02/12  9:45 AM      Result Value Range Status   Specimen Description ABSCESS RIGHT KNEE PREPATELLAR   Final   Special Requests PATIENT ON FOLLOWING AUGMENTIN SEPTRA   Final   Gram Stain     Final   Value: FEW WBC PRESENT,BOTH PMN AND MONONUCLEAR     NO ORGANISMS SEEN   Culture PENDING   Incomplete   Report Status PENDING   Incomplete  GRAM STAIN     Status: None   Collection Time    04/02/12  9:45 AM      Result Value Range Status   Specimen Description ABSCESS RIGHT KNEE PREPATELLAR   Final   Special Requests PATIENT ON FOLLOWING AUGMENTIN SEPTRA   Final   Gram Stain     Final   Value: ABUNDANT  WBC PRESENT,BOTH PMN AND MONONUCLEAR     NO ORGANISMS SEEN     Gram Stain Report Called to,Read Back By and Verified With: D. ALLEN RN AT 1050 ON 03.27.14 BY SHUEA.   Report Status 04/02/2012 FINAL   Final    Anti-infectives   Start     Dose/Rate Route Frequency Ordered Stop   04/03/12 2300  vancomycin (VANCOCIN) IVPB 1000 mg/200 mL premix     1,000 mg 200 mL/hr over 60 Minutes Intravenous Every 12 hours 04/03/12 1058     04/03/12 1100  vancomycin (VANCOCIN) 1,500 mg in sodium chloride 0.9 % 500 mL IVPB     1,500 mg 250 mL/hr over 120 Minutes Intravenous Every 24 hours 04/02/12 1306 04/04/12 1059   04/03/12 0000  sodium chloride 0.9 % SOLN 500 mL with vancomycin 10 G SOLR 1,500 mg    Comments:  Vancomycin 1500mg  IV q 24 hours This is to be monitored by [facility] through a weekly trough, pharmacy to adjust to obtain and maintain Vancomycin trough level 15-20 mcg/ml.   1,500 mg 250 mL/hr over 120 Minutes  Intravenous Every 24 hours 04/02/12 2111     04/03/12 0000  rifampin (RIFADIN) 300 MG capsule     300 mg Oral Every 12 hours 04/03/12 0923     04/02/12 1400  vancomycin (VANCOCIN) 2,000 mg in sodium chloride 0.9 % 500 mL IVPB  Status:  Discontinued     2,000 mg 250 mL/hr over 120 Minutes Intravenous  Once 04/02/12 1306 04/03/12 1053   04/02/12 1230  rifampin (RIFADIN) capsule 300 mg     300 mg Oral Every 12 hours 04/02/12 1216     04/02/12 1015  vancomycin (VANCOCIN) 1,500 mg in sodium chloride 0.9 % 500 mL IVPB    Comments:  Ordered to cover possible early post-operative bursitis, early postoperative period, reactive cellulitic response to poison ivy in community   1,500 mg 250 mL/hr over 120 Minutes Intravenous  Once 04/02/12 1009 04/02/12 1245   04/02/12 1015  ceFAZolin (ANCEF) IVPB 2 g/50 mL premix    Comments:  Per-operative use for bursectomy post total knee replacement   2 g 100 mL/hr over 30 Minutes Intravenous  Once 04/02/12 1009     04/02/12 0950  polymyxin B 500,000 Units, bacitracin 50,000 Units in sodium chloride irrigation 0.9 % 500 mL irrigation  Status:  Discontinued       As needed 04/02/12 1001 04/02/12 1022   04/02/12 0000  rifampin (RIFADIN) 300 MG capsule  Status:  Discontinued     300 mg Oral Every 12 hours 04/02/12 2111 04/03/12      Assessment: 61 yoM about 8 weeks s/p RTK replacemt, doing well until contact dermatitis rxn to poison ivy. Pt received 1 week of antibiotics (bactrim DS and amoxicillin) but still with significant prepatellar swelling, went to OR today for I/D, now on D#2 IV vancomycin/rifampin for 4 weeks.  Gram stain of wound culture from 3/24 shows rare staph aureus, pansensitive. Wound cx/gram stain 3/27: NGTD.  Renal: SCr improved from 3/20, CrCl now ~90 (N~71) WBC: WNL Currently afebrile   Noted plans for d/c soon  Goal of Therapy:  Vancomycin trough level 15-20 mcg/ml   Plan:  1. Due to improved renal function, change dose to Vancomycin  1gram IV q 12 hours.   2. F/u Vancomycin trough at steady state  3. F/u renal fxn, cultures, WBC, T, clinical course  Haynes Hoehn, PharmD 04/03/2012 11:02 AM  Pager: [phone]

## 2012-04-03 NOTE — Progress Notes (Signed)
Found patient already on home CPAP machine. Tolerating well with stable VS.

## 2012-04-04 NOTE — Care Management (Signed)
Spoke with Chris Obrien at [phone] concerning MD order for [facility] services with Genevieve Norlander prior to discharge. Per Genevieve Norlander rep pt scheduled visit 04/04/12. No other needs stated by pt priro to discharge.   Roxy Manns Chany Woolworth,RN,BSN [phone]

## 2012-04-05 LAB — CULTURE, ROUTINE-ABSCESS: Culture: NO GROWTH

## 2012-04-07 LAB — ANAEROBIC CULTURE

## 2012-04-09 ENCOUNTER — Telehealth: Payer: Self-pay | Admitting: Internal Medicine

## 2012-04-09 NOTE — Telephone Encounter (Signed)
Patient Information:  Caller Name: Mickle Plumb, LPN  Phone: [phone]  Patient: Chris Obrien, Chris Obrien  Gender: Male  DOB: 1950/05/01  Age: 62 Years  PCP: Plotnikov, Alex (Adults only)  Office Follow Up:  Does the office need to follow up with this patient?: No  Instructions For The Office: N/A  RN Note:  Primarily [place] nurse wanted to make Dr. Posey Rea aware of elevated BP and flu-like sxs.  Symptoms  Reason For Call & Symptoms: Onset 04/03/2012 elevated BP (04/08/2012 170/102 and 04/09/2012 132/80ish).  Onset 04/08/2012 elevated Temp, achy joints, AM sweats and PM chills.  [facility] seeing patient to administer Vancomycin IV via PICC for right knee per Dr. Charlann Boxer, orrhopedist.  Reviewed Health History In EMR: Yes  Reviewed Medications In EMR: Yes  Reviewed Allergies In EMR: Yes  Reviewed Surgeries / Procedures: Yes  Date of Onset of Symptoms: 04/03/2012  Treatments Tried: Tylenol  Treatments Tried Worked: Yes  Any Fever: Yes  Fever Taken: Oral  Fever Time Of Reading: 08:00:00  Fever Last Reading: 99.0  Guideline(s) Used:  Influenza - Seasonal  Disposition Per Guideline:   Home Care  Reason For Disposition Reached:   Probable influenza with no complications and not HIGH RISK  Advice Given:  Reassurance  For most healthy adults, influenza feels like a bad cold. The dangers of influenza for normal, healthy people (under 24 years of age) are overrated.  The treatment of influenza depends on your main symptoms. Generally, treatment is the same as for other viral respiratory infections (colds). Bed rest is unnecessary.  Here is some care advice that should help.  Treating the Symptoms of Flu  Fever, Muscle Aches, and Headache: For fever more than 101 F (38.3 C), muscle aches, and headaches, take acetaminophen every 4-6 hours (Adults 650 mg) OR ibuprofen every 6-8 hours (Adults 400-600 mg).  Hydrate: Drink extra liquids. If the air in your home is dry, use a humidifier.  Isolation is Needed Until After the Fever is Gone:   The CDC recommends that people with influenza-like illness remain at home until at least 24 hours after they are free of fever (100 F or 37.8C).  Do NOT go to work or school.  Do NOT go to church, child care centers, shopping, or other public places.  Do NOT shake hands.  Avoid close contact with others (hugging, kissing).  Expected Course  : The fever lasts 2-3 days, the runny nose 5-10 days, and the cough 2-3 weeks.  Expected Course  : The fever lasts 2-3 days, the runny nose 5-10 days, and the cough 2-3 weeks.  Pain and Fever Medicines:  For pain or fever relief, take either acetaminophen or ibuprofen.  They are over-the-counter (OTC) drugs that help treat both fever and pain. You can buy them at the drugstore.  Treat fevers above 101 F (38.3 C). The goal of fever therapy is to bring the fever down to a comfortable level. Remember that fever medicine usually lowers fever 2 degrees F (1 - 1 1/2 degrees C).  Acetaminophen (e.g., Tylenol):  Regular Strength Tylenol: Take 650 mg (two 325 mg pills) by mouth every 4-6 hours as needed. Each Regular Strength Tylenol pill has 325 mg of acetaminophen.  Extra Strength Tylenol: Take 1,000 mg (two 500 mg pills) every 8 hours as needed. Each Extra Strength Tylenol pill has 500 mg of acetaminophen.  The most you should take each day is 3,000 mg (10 Regular Strength or 6 Extra Strength pills a day).  Patient Will Follow Care Advice:  YES

## 2012-04-15 ENCOUNTER — Encounter: Payer: Self-pay | Admitting: Internal Medicine

## 2012-04-21 NOTE — Progress Notes (Signed)
Need orders please - DOS 04/27/12 To Be Same Day Surg 4/21 - Thank You

## 2012-04-22 ENCOUNTER — Encounter (HOSPITAL_COMMUNITY): Payer: Self-pay | Admitting: Pharmacy Technician

## 2012-04-22 ENCOUNTER — Encounter (HOSPITAL_COMMUNITY): Payer: Self-pay | Admitting: *Deleted

## 2012-04-22 ENCOUNTER — Other Ambulatory Visit (HOSPITAL_COMMUNITY): Payer: Self-pay | Admitting: *Deleted

## 2012-04-22 NOTE — Discharge Instructions (Signed)
         PT INSTRUCTED TO ARRIVE AT SHORT STAY CENTER 04/27/12 AT 1:00 PM        INSTRUCTED NO FOOD AFTER MIDNIGHT BUT MAY HAVE CLEAR LIQUIDS FROM 12:00 AM TO 10:00 AM          INSTRUCTED TO TAKE RIFAMPIN AND IV VANCOMYCIN  AND MAY TAKE HYDROCODONE IF NEEDED        INSTRUCTED TO SHOWER WITH BETASEPT SOAP AS PER PROTOCOL   INSTRUCTED TO BRING C-PAP MASK AND TUBING TO HOSPITAL

## 2012-04-24 ENCOUNTER — Encounter (HOSPITAL_COMMUNITY): Payer: Self-pay | Admitting: Emergency Medicine

## 2012-04-24 ENCOUNTER — Inpatient Hospital Stay (HOSPITAL_COMMUNITY)
Admission: EM | Admit: 2012-04-24 | Discharge: 2012-04-25 | DRG: 607 | Disposition: A | Discharge status: Home Health | Payer: PRIVATE HEALTH INSURANCE | Attending: Internal Medicine | Admitting: Internal Medicine

## 2012-04-24 DIAGNOSIS — Z79899 Other long term (current) drug therapy: Secondary | ICD-10-CM

## 2012-04-24 DIAGNOSIS — E871 Hypo-osmolality and hyponatremia: Secondary | ICD-10-CM

## 2012-04-24 DIAGNOSIS — G4733 Obstructive sleep apnea (adult) (pediatric): Secondary | ICD-10-CM | POA: Diagnosis present

## 2012-04-24 DIAGNOSIS — Z87898 Personal history of other specified conditions: Secondary | ICD-10-CM | POA: Diagnosis present

## 2012-04-24 DIAGNOSIS — L270 Generalized skin eruption due to drugs and medicaments taken internally: Principal | ICD-10-CM | POA: Diagnosis present

## 2012-04-24 DIAGNOSIS — R21 Rash and other nonspecific skin eruption: Secondary | ICD-10-CM | POA: Diagnosis present

## 2012-04-24 DIAGNOSIS — R509 Fever, unspecified: Secondary | ICD-10-CM

## 2012-04-24 DIAGNOSIS — M25562 Pain in left knee: Secondary | ICD-10-CM

## 2012-04-24 DIAGNOSIS — Z87891 Personal history of nicotine dependence: Secondary | ICD-10-CM

## 2012-04-24 DIAGNOSIS — N400 Enlarged prostate without lower urinary tract symptoms: Secondary | ICD-10-CM | POA: Diagnosis present

## 2012-04-24 DIAGNOSIS — Z96659 Presence of unspecified artificial knee joint: Secondary | ICD-10-CM

## 2012-04-24 DIAGNOSIS — M25569 Pain in unspecified knee: Secondary | ICD-10-CM

## 2012-04-24 DIAGNOSIS — T368X5A Adverse effect of other systemic antibiotics, initial encounter: Secondary | ICD-10-CM | POA: Diagnosis present

## 2012-04-24 LAB — COMPREHENSIVE METABOLIC PANEL
ALT: 60 U/L — ABNORMAL HIGH (ref 0–53)
AST: 31 U/L (ref 0–37)
Albumin: 2.2 g/dL — ABNORMAL LOW (ref 3.5–5.2)
Alkaline Phosphatase: 52 U/L (ref 39–117)
BUN: 16 mg/dL (ref 6–23)
CO2: 22 mEq/L (ref 19–32)
Calcium: 7.6 mg/dL — ABNORMAL LOW (ref 8.4–10.5)
Chloride: 97 mEq/L (ref 96–112)
Creatinine, Ser: 1.29 mg/dL (ref 0.50–1.35)
GFR calc Af Amer: 67 mL/min — ABNORMAL LOW (ref >90)
GFR calc non Af Amer: 57 mL/min — ABNORMAL LOW (ref >90)
Glucose, Bld: 91 mg/dL (ref 70–99)
Potassium: 3.9 mEq/L (ref 3.5–5.1)
Sodium: 130 mEq/L — ABNORMAL LOW (ref 135–145)
Total Bilirubin: 0.3 mg/dL (ref 0.3–1.2)
Total Protein: 5 g/dL — ABNORMAL LOW (ref 6.0–8.3)

## 2012-04-24 LAB — CBC WITH DIFFERENTIAL/PLATELET
Basophils Absolute: 0.1 10*3/uL (ref 0.0–0.1)
Basophils Relative: 1 % (ref 0–1)
Eosinophils Absolute: 0.8 10*3/uL — ABNORMAL HIGH (ref 0.0–0.7)
Eosinophils Relative: 17 % — ABNORMAL HIGH (ref 0–5)
HCT: 37.8 % — ABNORMAL LOW (ref 39.0–52.0)
Hemoglobin: 13.1 g/dL (ref 13.0–17.0)
Lymphocytes Relative: 18 % (ref 12–46)
Lymphs Abs: 0.9 10*3/uL (ref 0.7–4.0)
MCH: 28.7 pg (ref 26.0–34.0)
MCHC: 34.7 g/dL (ref 30.0–36.0)
MCV: 82.9 fL (ref 78.0–100.0)
Monocytes Absolute: 0.5 10*3/uL (ref 0.1–1.0)
Monocytes Relative: 10 % (ref 3–12)
Neutro Abs: 2.6 10*3/uL (ref 1.7–7.7)
Neutrophils Relative %: 53 % (ref 43–77)
Platelets: 280 10*3/uL (ref 150–400)
RBC: 4.56 MIL/uL (ref 4.22–5.81)
RDW: 13.4 % (ref 11.5–15.5)
WBC: 4.9 10*3/uL (ref 4.0–10.5)

## 2012-04-24 LAB — TSH: TSH: 2.444 u[IU]/mL (ref 0.350–4.500)

## 2012-04-24 LAB — URINALYSIS, ROUTINE W REFLEX MICROSCOPIC
Bilirubin Urine: NEGATIVE
Glucose, UA: NEGATIVE mg/dL
Hgb urine dipstick: NEGATIVE
Ketones, ur: NEGATIVE mg/dL
Leukocytes, UA: NEGATIVE
Nitrite: NEGATIVE
Protein, ur: NEGATIVE mg/dL
Specific Gravity, Urine: 1.027 (ref 1.005–1.030)
Urobilinogen, UA: 0.2 mg/dL (ref 0.0–1.0)
pH: 5.5 (ref 5.0–8.0)

## 2012-04-24 LAB — CG4 I-STAT (LACTIC ACID): Lactic Acid, Venous: 2.6 mmol/L — ABNORMAL HIGH (ref 0.5–2.2)

## 2012-04-24 MED ORDER — METHYLPREDNISOLONE SODIUM SUCC 125 MG IJ SOLR
60.0000 mg | Freq: Two times a day (BID) | INTRAMUSCULAR | Status: DC
  Administered 2012-04-24 – 2012-04-25 (×2): 60 mg via INTRAVENOUS
  Filled 2012-04-24 (×3): qty 0.96

## 2012-04-24 MED ORDER — ASPIRIN EC 325 MG PO TBEC
325.0000 mg | DELAYED_RELEASE_TABLET | Freq: Every day | ORAL | Status: DC
Start: 2012-04-24 — End: 2012-04-25
  Administered 2012-04-24 – 2012-04-25 (×2): 325 mg via ORAL
  Filled 2012-04-24 (×2): qty 1

## 2012-04-24 MED ORDER — HYDROCODONE-ACETAMINOPHEN 7.5-325 MG PO TABS: 1.0000 | ORAL_TABLET | ORAL | Status: DC | PRN

## 2012-04-24 MED ORDER — ENOXAPARIN SODIUM 40 MG/0.4ML ~~LOC~~ SOLN
40.0000 mg | SUBCUTANEOUS | Status: DC
  Administered 2012-04-24: 40 mg via SUBCUTANEOUS
  Filled 2012-04-24 (×2): qty 0.4

## 2012-04-24 MED ORDER — SODIUM CHLORIDE 0.9 % IV BOLUS (SEPSIS): 1000.0000 mL | Freq: Once | INTRAVENOUS | Status: DC

## 2012-04-24 MED ORDER — SODIUM CHLORIDE 0.9 % IV SOLN
INTRAVENOUS | Status: DC
  Administered 2012-04-24: 800 mL via INTRAVENOUS
  Administered 2012-04-24: 22:00:00 via INTRAVENOUS

## 2012-04-24 MED ORDER — CIPROFLOXACIN IN D5W 400 MG/200ML IV SOLN
400.0000 mg | Freq: Two times a day (BID) | INTRAVENOUS | Status: DC
  Administered 2012-04-24 – 2012-04-25 (×3): 400 mg via INTRAVENOUS
  Filled 2012-04-24 (×3): qty 200

## 2012-04-24 MED ORDER — DIPHENHYDRAMINE HCL 50 MG/ML IJ SOLN
50.0000 mg | Freq: Once | INTRAMUSCULAR | Status: AC
  Administered 2012-04-24: 50 mg via INTRAVENOUS
  Filled 2012-04-24: qty 1

## 2012-04-24 MED ORDER — ONDANSETRON HCL 4 MG/2ML IJ SOLN: 4.0000 mg | Freq: Four times a day (QID) | INTRAMUSCULAR | Status: DC | PRN

## 2012-04-24 MED ORDER — TAMSULOSIN HCL 0.4 MG PO CAPS
0.4000 mg | ORAL_CAPSULE | Freq: Every day | ORAL | Status: DC
  Administered 2012-04-24 – 2012-04-25 (×2): 0.4 mg via ORAL
  Filled 2012-04-24 (×2): qty 1

## 2012-04-24 MED ORDER — SODIUM CHLORIDE 0.9 % IV SOLN
INTRAVENOUS | Status: DC
  Administered 2012-04-24: 10:00:00 via INTRAVENOUS

## 2012-04-24 MED ORDER — SODIUM CHLORIDE 0.9 % IV SOLN: INTRAVENOUS | Status: DC

## 2012-04-24 MED ORDER — ONDANSETRON HCL 4 MG PO TABS: 4.0000 mg | ORAL_TABLET | Freq: Four times a day (QID) | ORAL | Status: DC | PRN

## 2012-04-24 MED ORDER — ZOLPIDEM TARTRATE 10 MG PO TABS
10.0000 mg | ORAL_TABLET | Freq: Every evening | ORAL | Status: DC | PRN
  Administered 2012-04-24: 10 mg via ORAL
  Filled 2012-04-24: qty 1

## 2012-04-24 MED ORDER — DIPHENHYDRAMINE HCL 50 MG/ML IJ SOLN
12.5000 mg | Freq: Four times a day (QID) | INTRAMUSCULAR | Status: AC
  Administered 2012-04-24 – 2012-04-25 (×4): 12.5 mg via INTRAVENOUS
  Filled 2012-04-24 (×4): qty 1

## 2012-04-24 MED ORDER — METHYLPREDNISOLONE SODIUM SUCC 125 MG IJ SOLR
60.0000 mg | Freq: Once | INTRAMUSCULAR | Status: AC
  Administered 2012-04-24: 60 mg via INTRAVENOUS
  Filled 2012-04-24: qty 0.96

## 2012-04-24 NOTE — ED Notes (Signed)
Pt presenting to ed with c/o possible allergic reaction to vancomycin x 2 days. Pt with redness and swelling noted to extremities pt also with c/o fever. Pt states he is also on rifampin. Pt states last dose of vancomycin was x 2 days ago.

## 2012-04-24 NOTE — H&P (Signed)
[facility] History and Physical  BRAYSEN CLOWARD ZOX:[id number] DOB: 19-Sep-1950 DOA: 04/24/2012  Referring physician: Dr Juleen China PCP: Sonda Primes, MD  Specialists: Dr Charlann Boxer  Chief Complaint: rash started yesterday.  HPI: ROBERTT BUDA is a 62 y.o. 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 Pt denies itching or pain. He reports slight swelling of the hands. He denies sob, hematuria, abd pain, diarrhea. He is being admitted to medical service for management of drug reaction/ allergic reaction to vancomycin or rifampin.   Review of Systems: The patient denies anorexia, fever, weight loss,, vision loss, decreased hearing, hoarseness, chest pain, syncope, dyspnea on exertion, peripheral edema, balance deficits, hemoptysis, abdominal pain, melena, hematochezia, severe indigestion/heartburn, hematuria, incontinence, genital sores, muscle weakness, , transient blindness, difficulty walking, depression, unusual weight change, abnormal bleeding, enlarged lymph nodes, angioedema, and breast masses.    Past Medical History  Diagnosis Date  . Benign neoplasm of colon   . Insomnia, unspecified   . Oligospermia   . OSA on CPAP   . Swelling of joint of right knee   . Right knee skin infection   . BPH (benign prostatic hypertrophy)     hx small stream  . Arthritis   . Acute reactive arthritis per pcp note from 03-30-2012    right pre-patella---  secondary contact dermitis with poison ivy exposure 03-26-2012   Past Surgical History  Procedure Laterality Date  . Knee arthroscopy Right aug 2012  . Ulnar nerve repair Right 2008  . 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 Irrigation and debridement  knee Right 04/02/2012    Procedure: IRRIGATION AND DEBRIDEMENT RIGHT PRE-PATELLA  ;  Surgeon: [name], MD;  Location: [facility];  Service: Orthopedics;  Laterality: Right;   Social History:  reports that he quit smoking about 4 years ago. His smoking use included Cigars. He has never used smokeless tobacco. He reports that  drinks alcohol. He reports that he does not use illicit drugs.  where does patient live--home,   Allergies  Allergen Reactions  . Oxycodone Rash    Family History  Problem Relation Age of Onset  . Hyperlipidemia    . Alzheimer's disease Mother   . Arthritis Father   . Diabetes Brother     Prior to Admission medications   Medication Sig Start Date End Date Taking? Authorizing Provider  aspirin EC 325 MG EC tablet Take 1 tablet (325 mg total) by mouth daily with breakfast. 04/03/12  Yes Genelle Gather Babish, PA-C  diphenhydrAMINE (BENADRYL) 25 mg capsule Take 50-75 mg by mouth every 6 (six) hours as needed for itching.   Yes Historical Provider, MD  folic acid (FOLVITE) 1 MG tablet Take 1 mg by mouth daily.   Yes Historical Provider, MD  HYDROcodone-acetaminophen (NORCO) 7.5-325 MG per tablet Take 1-2 tablets by mouth every 4 (four) hours as needed for pain. 04/03/12  Yes Genelle Gather Babish, PA-C  Multiple Vitamin (MULTIVITAMIN WITH MINERALS) TABS Take 1 tablet by mouth daily.   Yes Historical Provider, MD  orlistat (ALLI) 60 MG capsule Take 60 mg by mouth daily before breakfast.   Yes Historical Provider, MD  rifampin (RIFADIN) 300 MG capsule Take 1 capsule (  300 mg total) by mouth every 12 (twelve) hours. 04/03/12  Yes Genelle Gather Babish, PA-C  sodium chloride 0.9 % SOLN 150 mL with vancomycin 1000 MG SOLR 750 mg Inject 750 mg into the vein every 12 (twelve) hours.   Yes Historical Provider, MD  tamsulosin (FLOMAX) 0.4 MG CAPS Take 0.4 mg by mouth daily.   Yes Historical Provider, MD   Physical Exam: Filed Vitals:   04/24/12 0835 04/24/12  1216 04/24/12 1438  BP: 112/62 123/59 97/63  Pulse: 91  87  Temp: 98.1 F (36.7 C) 98 F (36.7 C) 99.4 F (37.4 C)  TempSrc: Oral Oral Oral  Resp: 18 21 22   SpO2: 98% 94% 97%    Constitutional: Vital signs reviewed.  Patient is a well-developed and well-nourished  in no acute distress and cooperative with exam. Alert and oriented x3.  Head: Normocephalic and atraumatic Mouth: no erythema or exudates, MMM Eyes: PERRL, EOMI, conjunctivae normal, No scleral icterus.  Neck: Supple, Trachea midline normal ROM, No JVD, mass, thyromegaly,   Cardiovascular: RRR, S1 normal, S2 normal, no MRG, pulses symmetric and intact bilaterally Pulmonary/Chest: CTAB, no wheezes, rales, or rhonchi Abdominal: Soft. Non-tender, non-distended, bowel sounds are normal, no masses, organomegaly, or guarding present.  Musculoskeletal: No joint deformities, erythema, or stiffness, ROM full and no nontender Neurological: A&O x3, Strength is normal and symmetric bilaterally, cranial nerve II-XII are grossly intact, no focal motor deficit, sensory intact to light touch bilaterally.  Skin: diffuse blanchable maculo- erythematous rash all over the body, non itchy and non painful Psychiatric: Normal mood and affect. speech and behavior is normal. Judgment and thought content normal.   Labs on Admission:  Basic Metabolic Panel:  Recent Labs Lab 04/24/12 0915  NA 130*  K 3.9  CL 97  CO2 22  GLUCOSE 91  BUN 16  CREATININE 1.29  CALCIUM 7.6*   Liver Function Tests:  Recent Labs Lab 04/24/12 0915  AST 31  ALT 60*  ALKPHOS 52  BILITOT 0.3  PROT 5.0*  ALBUMIN 2.2*   No results found for this basename: LIPASE, AMYLASE,  in the last 168 hours No results found for this basename: AMMONIA,  in the last 168 hours CBC:  Recent Labs Lab 04/24/12 0915  WBC 4.9  NEUTROABS 2.6  HGB 13.1  HCT 37.8*  MCV 82.9  PLT 280   Cardiac Enzymes: No results found for this basename: CKTOTAL, CKMB, CKMBINDEX,  TROPONINI,  in the last 168 hours  BNP (last 3 results) No results found for this basename: PROBNP,  in the last 8760 hours CBG: No results found for this basename: GLUCAP,  in the last 168 hours  Radiological Exams on Admission: No results found.    Assessment/Plan Active Problems:    1. Drug reaction/ : associated with increase in eosinophils. Probably from the vancomycin. Will need to look out for DRUG reactionwith eosinophilia and systemic symptoms. Started him on IV solumedrol and IV benadryl. Stopped the IV vancomycina nd rifampin.   2. staph aureus infection of the right knee : sensitivies show staph aureus sensitive to cipro and clindamycin. Will start him on IV cipro and watch for worsening of the drug reaction. Spoke to Dr Shon Baton from [facility] orthopedics and updated on the patient.   DVT prophylaxis.   Code Status: full code Family Communication: wife at bedside Disposition Plan: pending.   Time spent: 75 min  Sanjuana Mruk [facility] Pager [phone]  If 7PM-7AM, please contact night-coverage [email or web address] Password [facility] 04/24/2012, 3:02 PM

## 2012-04-24 NOTE — ED Notes (Signed)
Report to [employer]

## 2012-04-24 NOTE — ED Provider Notes (Signed)
History    61yM with diffuse erythematous rash and fever. Pt is s/p right total knee replacement in 01/28/12 and was doing well until complicated shortly later by contact dermatitis reaction to poison ivy. Initial erythematous reaction associated with large prepatellar swelling. He was started on PO abx with improvement of erythema but increasing prepatellar swelling. He subsequently underwent irrigated and debridement for reactive versus infectious R prepatellar bursitis on 3/27. He was then started on vancomycin BID via R arm PICC and BID rifampin. He reports having occasional mild erythema around site of PICC with vancomycin infusion which subsequently resolved shortly after the infusion was completed.  On Wednesday began have diffuse erythema to essentially entire body and swelling. Has been taking benadryl with little apparent affect. No other new meds or exposures that he is aware of.  Pt also reports he has been having persistent fever for the past several weeks up to 104. Little symptoms otherwise. Reports knee feels good and denies joint pain anywhere else. No abdominal pain or anorexia. No n/v. No lymphadenopathy. No dizziness or lightheadedness.  No urinary complaints.    CSN: [id number]  Arrival date & time 04/24/12  [id number]   First MD Initiated Contact with Patient 04/24/12 [phone]      Chief Complaint  Patient presents with  . Allergic Reaction    (Consider location/radiation/quality/duration/timing/severity/associated sxs/prior treatment) HPI  Past Medical History  Diagnosis Date  . Benign neoplasm of colon   . Insomnia, unspecified   . Oligospermia   . OSA on CPAP   . Swelling of joint of right knee   . Right knee skin infection   . BPH (benign prostatic hypertrophy)     hx small stream  . Arthritis   . Acute reactive arthritis per pcp note from 03-30-2012    right pre-patella---  secondary contact dermitis with poison ivy exposure 03-26-2012    Past Surgical History   Procedure Laterality Date  . Knee arthroscopy Right aug 2012  . Ulnar nerve repair Right 2008  . 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 Irrigation and debridement knee Right 04/02/2012    Procedure: IRRIGATION AND DEBRIDEMENT RIGHT PRE-PATELLA  ;  Surgeon: [name], MD;  Location: [facility];  Service: Orthopedics;  Laterality: Right;    Family History  Problem Relation Age of Onset  . Hyperlipidemia    . Alzheimer's disease Mother   . Arthritis Father   . Diabetes Brother     History  Substance Use Topics  . Smoking status: Former Smoker    Types: Cigars    Quit date: 01/08/2008  . Smokeless tobacco: Never Used  . Alcohol Use: Yes     Comment: occasionally      Review of Systems  All systems reviewed and negative, other than as noted in HPI.   Allergies  Oxycodone  Home Medications   Current Outpatient Rx  Name  Route  Sig  Dispense  Refill  . aspirin EC 325 MG EC tablet   Oral   Take 1 tablet (325 mg total) by mouth daily with breakfast.   30 tablet      . folic acid (FOLVITE) 1 MG tablet   Oral   Take 1 mg by mouth daily.         [name] HYDROcodone-acetaminophen (NORCO) 7.5-325 MG per tablet   Oral  Take 1-2 tablets by mouth every 4 (four) hours as needed for pain.   120 tablet   0   . Multiple Vitamin (MULTIVITAMIN WITH MINERALS) TABS   Oral   Take 1 tablet by mouth daily.         [name] orlistat (ALLI) 60 MG capsule   Oral   Take 60 mg by mouth daily before breakfast.         . rifampin (RIFADIN) 300 MG capsule   Oral   Take 1 capsule (300 mg total) by mouth every 12 (twelve) hours.   56 capsule   0     BP 112/62  Pulse 91  Temp(Src) 98.1 F (36.7 C) (Oral)  Resp 18  SpO2 98%  Physical Exam  Nursing note and vitals reviewed. Constitutional: He appears well-developed and  well-nourished. No distress.  HENT:  Head: Normocephalic and atraumatic.  Eyes: Conjunctivae are normal. Pupils are equal, round, and reactive to light. Right eye exhibits no discharge. Left eye exhibits no discharge. No scleral icterus.  Neck: Neck supple.  Cardiovascular: Normal rate, regular rhythm and normal heart sounds.  Exam reveals no gallop and no friction rub.   No murmur heard. R arm PICC site looks good  Pulmonary/Chest: Effort normal and breath sounds normal. No respiratory distress. He has no wheezes.  Abdominal: Soft. He exhibits no distension. There is no tenderness.  Musculoskeletal: He exhibits no edema and no tenderness.  Surgical site R knee appearing to be healing well. Incision intact and w/o drainage. There is erythema but pattern/intensity doesn't seem significantly different than elsewhere on his body. Mild decrease in end ROM with knee flexion, but movement is w/o pain. NVI distally.   Neurological: He is alert.  Skin: Skin is warm and dry. Rash noted. He is not diaphoretic. There is erythema.  Generalized splotchy erythematous rash. Poorly demarcated borders. Nontender. Blanches. No desquamation. Neg nikolsky.  No adenopathy appreciated.   Psychiatric: He has a normal mood and affect. His behavior is normal. Thought content normal.    ED Course  Procedures (including critical care time)  Labs Reviewed  CBC WITH DIFFERENTIAL - Abnormal; Notable for the following:    HCT 37.8 (*)    Eosinophils Relative 17 (*)    Eosinophils Absolute 0.8 (*)    All other components within normal limits  COMPREHENSIVE METABOLIC PANEL - Abnormal; Notable for the following:    Sodium 130 (*)    Calcium 7.6 (*)    Total Protein 5.0 (*)    Albumin 2.2 (*)    ALT 60 (*)    GFR calc non Af Amer 57 (*)    GFR calc Af Amer 67 (*)    All other components within normal limits  URINALYSIS, ROUTINE W REFLEX MICROSCOPIC - Abnormal; Notable for the following:    Color, Urine AMBER (*)     APPearance CLOUDY (*)    All other components within normal limits  COMPREHENSIVE METABOLIC PANEL - Abnormal; Notable for the following:    Sodium 131 (*)    Glucose, Bld 102 (*)    Calcium 7.5 (*)    Total Protein 4.4 (*)    Albumin 2.0 (*)    Total Bilirubin 0.2 (*)    GFR calc non Af Amer 71 (*)    GFR calc Af Amer 82 (*)    All other components within normal limits  CBC - Abnormal; Notable for the following:    Hemoglobin 12.0 (*)    HCT  35.2 (*)    All other components within normal limits  CG4 I-STAT (LACTIC ACID) - Abnormal; Notable for the following:    Lactic Acid, Venous 2.60 (*)    All other components within normal limits  CULTURE, BLOOD (ROUTINE X 2)  CULTURE, BLOOD (ROUTINE X 2)  URINE CULTURE  TSH  LACTIC ACID, PLASMA   No results found.   1. Generalized rash   2. Fever   3. Hyponatremia   4. Knee pain, left   5. Obstructive sleep apnea (adult) (pediatric)       MDM  61yM with diffuse rash and fever. Suspect possible drug reaction. Mild elevation in lactic acid. Suspect more from mild dehydration from fever/insensible losses than infection. Hyponatremia and mild increase in Cr in would explain this.  Eosinophilia. Additional abx deferred at this time. Admit.         Raeford Razor, MD 04/25/12 [phone]

## 2012-04-25 DIAGNOSIS — G4733 Obstructive sleep apnea (adult) (pediatric): Secondary | ICD-10-CM

## 2012-04-25 DIAGNOSIS — R21 Rash and other nonspecific skin eruption: Secondary | ICD-10-CM | POA: Diagnosis present

## 2012-04-25 DIAGNOSIS — R509 Fever, unspecified: Secondary | ICD-10-CM

## 2012-04-25 LAB — COMPREHENSIVE METABOLIC PANEL
ALT: 51 U/L (ref 0–53)
AST: 24 U/L (ref 0–37)
Albumin: 2 g/dL — ABNORMAL LOW (ref 3.5–5.2)
Alkaline Phosphatase: 50 U/L (ref 39–117)
BUN: 13 mg/dL (ref 6–23)
CO2: 24 mEq/L (ref 19–32)
Calcium: 7.5 mg/dL — ABNORMAL LOW (ref 8.4–10.5)
Chloride: 100 mEq/L (ref 96–112)
Creatinine, Ser: 1.08 mg/dL (ref 0.50–1.35)
GFR calc Af Amer: 82 mL/min — ABNORMAL LOW (ref >90)
GFR calc non Af Amer: 71 mL/min — ABNORMAL LOW (ref >90)
Glucose, Bld: 102 mg/dL — ABNORMAL HIGH (ref 70–99)
Potassium: 4 mEq/L (ref 3.5–5.1)
Sodium: 131 mEq/L — ABNORMAL LOW (ref 135–145)
Total Bilirubin: 0.2 mg/dL — ABNORMAL LOW (ref 0.3–1.2)
Total Protein: 4.4 g/dL — ABNORMAL LOW (ref 6.0–8.3)

## 2012-04-25 LAB — CBC
HCT: 35.2 % — ABNORMAL LOW (ref 39.0–52.0)
Hemoglobin: 12 g/dL — ABNORMAL LOW (ref 13.0–17.0)
MCH: 28.3 pg (ref 26.0–34.0)
MCHC: 34.1 g/dL (ref 30.0–36.0)
MCV: 83 fL (ref 78.0–100.0)
Platelets: 264 10*3/uL (ref 150–400)
RBC: 4.24 MIL/uL (ref 4.22–5.81)
RDW: 13.8 % (ref 11.5–15.5)
WBC: 5.4 10*3/uL (ref 4.0–10.5)

## 2012-04-25 LAB — URINE CULTURE
Colony Count: NO GROWTH
Culture: NO GROWTH

## 2012-04-25 LAB — LACTIC ACID, PLASMA: Lactic Acid, Venous: 2 mmol/L (ref 0.5–2.2)

## 2012-04-25 MED ORDER — ALTEPLASE 100 MG IV SOLR
2.0000 mg | Freq: Once | INTRAVENOUS | Status: AC
  Administered 2012-04-25: 2 mg
  Filled 2012-04-25: qty 2

## 2012-04-25 MED ORDER — CIPROFLOXACIN IN D5W 400 MG/200ML IV SOLN: 400.0000 mg | Freq: Two times a day (BID) | INTRAVENOUS | Status: DC

## 2012-04-25 MED ORDER — PREDNISONE 10 MG PO TABS: ORAL_TABLET | ORAL | Status: DC

## 2012-04-25 NOTE — Discharge Instructions (Signed)
Drug Rash  Skin reactions can be caused by several different drugs. Allergy to the medicine can cause itching, hives, and other rashes. Sun exposure causes a red rash with some medicines. Mononucleosis virus can cause a similar red rash when you are taking antibiotics. Sometimes, the rash may be accompanied by pain. The drug rash may happen with new drugs or with medicines that you have been taking for a while. The rash cannot be spread from person to person. In most cases, the symptoms of a drug rash are gone within a few days of stopping the medicine.  Your rash, including hives (urticaria), is most likely from the following medicines:   Antibiotics or antimicrobials.   Anticonvulsants or seizure medicines.   Antihypertensives or blood pressure medicines.   Antimalarials.   Antidepressants or depression medicines.   Antianxiety drugs.   Diuretics or water pills.   Nonsteroidal anti-inflammatory drugs.   Simvastatin.   Lithium.   Omeprazole.   Allopurinol.   Pseudoephedrine.   Amiodarone.   Packed red blood cells, when you get a blood transfusion.   Contrast media, such as when getting an imaging test (CT or CAT scan).  This drug list is not all inclusive, but drug rashes have been reported with all the medicines listed above.Your caregiver will tell you which medicines to avoid.  If you react to a medicine, a similar or worse reaction can occur the next time you take it. If you need to stop taking an antibiotic because of a drug rash, an alternative antibiotic may be needed to get rid of your infection. Antihistamine or cortisone drugs may be prescribed to help relieve your symptoms. Stay out of the sun until the rash is completely gone.   Be sure to let your caregiver know about your drug reaction. Do not take this medicine in the future. Call your caregiver if your drug rash does not improve within 3 to 4 days.  SEEK IMMEDIATE MEDICAL CARE IF:    You develop breathing problems, swelling in the  throat, or wheezing.   You have weakness, fainting, fever, and muscle or joint pains.   You develop blisters or peeling of skin, especially around the mouth.  Document Released: 02/01/2004 Document Revised: 03/18/2011 Document Reviewed: 11/12/2007  [employer] Patient Information 2013 [employer].

## 2012-04-25 NOTE — Care Management Note (Signed)
   CARE MANAGEMENT NOTE 04/25/2012  Patient:  Chris Obrien, Chris Obrien   Account Number:  [id number]  Date Initiated:  04/25/2012  Documentation initiated by:  [facility]  Subjective/Objective Assessment:   admitted with rash     Action/Plan:   active with [facility]- had been administering own IV abx at home PTA   Anticipated DC Date:  04/25/2012   Anticipated DC Plan:  HOME W HOME HEALTH SERVICES  In-house referral  NA      DC Planning Services  NA      [facility] Choice  Resumption Of Svcs/PTA Provider   Choice offered to / List presented to:  C-1 Patient   DME arranged  NA      DME agency  NA     HH arranged  IV Antibiotics      HH agency  [facility]   Status of service:  Completed, signed off Medicare Important Message given?  NA - LOS <3 / Initial given by admissions (If response is "NO", the following Medicare IM given date fields will be blank) Date Medicare IM given:   Date Additional Medicare IM given:    Discharge Disposition:  HOME W HOME HEALTH SERVICES  Per UR Regulation:    If discussed at Long Length of Stay Meetings, dates discussed:    Comments:  04/25/12 Received referral for IV Cipro at home. Active with [facility] prior to admission for IV antibiotics. Change in IV antibiotic this time to Cipro. Will receive IV Cipro from [facility] pharmacy and will continue with [facility] for RN services. Patient to receive last Cipro dose in hospital prior to discharge home today. Will receive next Cipro on tomorrow 04/26/12 because he would have received his dosages for 04/25/12. Confirmed with [place] in [facility], pharmacy that med can be delivered to patient's home tonight 04/25/12 for start of 04/26/12. Confirmed with Lupita Leash with [facility] that nursing is available for IV abx on 04/26/12 as well. Faxed all the necessary paperwork and prescription to [facility] with verbal receipt received from [place]. Received fax  confirmations for both [facility] and [facility] for paperwork that was faxed. Made patient aware of all of the above. He reports he has independently been giving himself IV abx and he is without questions or concerns. Patient's nurse aware of the above as well. ATIKAHALL,RNCM [phone]

## 2012-04-25 NOTE — Progress Notes (Signed)
Patient discharged to home with family via wheelchair, discharge instructions reviewed with patient who verbalized understanding. New RX's given to patient, home health set up for IV abx, PICC line flushed prior to d/c.

## 2012-04-25 NOTE — Discharge Summary (Signed)
Physician Discharge Summary  CODEN FRANCHI JXB:[id number] DOB: 1950/05/20 DOA: 04/24/2012  PCP: Sonda Primes, MD  Admit date: 04/24/2012 Discharge date: 04/25/2012  Time spent: 20 minutes  Recommendations for Outpatient Follow-up:  1. Patient will go home with IV Cipro x10 days 2. He'll complete a steroid taper over the next 6 days  Discharge Diagnoses:  Rash and nonspecific skin or option Obstructive sleep apnea Benign prostatic hypertrophy  Discharge Condition: Improved, being discharged home  Diet recommendation: Regular  Filed Vitals:   04/25/12 1449  BP: 96/55  Pulse: 79  Temp: 98.3 F (36.8 C)  Resp: 18    History of present illness:  Chris Obrien is a 62 y.o. 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 Pt denies itching or pain. He reports slight swelling of the hands. He denies sob, hematuria, abd pain, diarrhea. He is being admitted to medical service for management of drug reaction/ allergic reaction to vancomycin or rifampin. He also noted to be in moderate of [place] creatinine 1.29.   Hospital Course:  Patient was started on IV steroids, when necessary Benadryl and rifampin/vancomycin were discontinued. In review of previous culture reports, patient was sensitive to Cipro and started on IV Cipro. By hospital day 2, patient's renal function was normalized. Vital signs are stable and he was feeling much better. He had decreased erythema and swelling. Had no further responses and was eager to be discharged. He was tolerating by mouth and ambulating comfortably. He an elevated lactic acid level on admission at 2.6 and by the following day was normalized at 2.0. Home health was notified and his antibiotics were changed over to IV Cipro which will continue for another 10 days to complete a total of 4  weeks of antibiotic therapy. He'll follow up with orthopedics as scheduled on Monday.  The rest of his medical issues: Sleep apnea and BPH were stable during his hospitalization.  Procedures:  None  Consultations:  None  Discharge Exam: Filed Vitals:   04/24/12 2149 04/25/12 0609 04/25/12 0612 04/25/12 1449  BP: 106/56 96/46 92/54  96/55  Pulse: 78 80  79  Temp: 98.3 F (36.8 C) 98.5 F (36.9 C)  98.3 F (36.8 C)  TempSrc: Oral Oral  Oral  Resp: 18 18  18   SpO2: 96% 95%  98%    General: Alert and oriented x3, no acute distress Cardiovascular: Regular rate and rhythm, S1-S2 Respiratory: Clear to auscultation bilaterally Abdomen: Soft, nontender, nondistended, positive bowel sounds Extremities: No clubbing or cyanosis, trace edema, erythema of the 4 extremities involving forearms and lower legs, however this is blanching and improved from previous record the patient's. Edema is also less.  Discharge Instructions  Discharge Orders   Future Orders Complete By Expires     Diet general  As directed     Increase activity slowly  As directed         Medication List    STOP taking these medications       rifampin 300 MG capsule  Commonly known as:  RIFADIN     sodium chloride 0.9 % SOLN 150 mL with vancomycin 1000 MG SOLR 750 mg      TAKE these medications       aspirin 325 MG EC tablet  Take 1 tablet (325 mg total) by mouth daily with breakfast.  ciprofloxacin 400 MG/200ML Soln  Commonly known as:  CIPRO  Inject 200 mLs (400 mg total) into the vein every 12 (twelve) hours.     diphenhydrAMINE 25 mg capsule  Commonly known as:  BENADRYL  Take 50-75 mg by mouth every 6 (six) hours as needed for itching.     folic acid 1 MG tablet  Commonly known as:  FOLVITE  Take 1 mg by mouth daily.     HYDROcodone-acetaminophen 7.5-325 MG per tablet  Commonly known as:  NORCO  Take 1-2 tablets by mouth every 4 (four) hours as needed for pain.     multivitamin with  minerals Tabs  Take 1 tablet by mouth daily.     orlistat 60 MG capsule  Commonly known as:  ALLI  Take 60 mg by mouth daily before breakfast.     predniSONE 10 MG tablet  Commonly known as:  DELTASONE  60mg  (6 tabs) po x 1 day, then 50mg  po on day 2, then 40 mg po on day 3, then 30mg  po on day 4, then 20mg  po on day 5, then 10mg  po on day 6, then stop.     tamsulosin 0.4 MG Caps  Commonly known as:  FLOMAX  Take 0.4 mg by mouth daily.           Follow-up Information   Follow up with Sonda Primes, MD In 1 month.   Contact information:   [address] [place]be [place] [address] [phone]       Follow up with [name], MD. (As scheduled)    Contact information:   [address], [address] [place] [address] [phone]       Follow up with [facility] . (for RN services )    Contact information:   [phone]      Follow up with [facility] (Pharmacy). (for IV Cipro medication)    Contact information:   [phone]- PHARMACY Department       The results of significant diagnostics from this hospitalization (including imaging, microbiology, ancillary and laboratory) are listed below for reference.    Significant Diagnostic Studies: Dg Chest Port 1 View  04/02/2012   IMPRESSION: PICC line as described above.  No other focal abnormality is seen.   Original Report Authenticated By: Alcide Clever, M.D.     Microbiology: Recent Results (from the past 240 hour(s))  CULTURE, BLOOD (ROUTINE X 2)     Status: None   Collection Time    04/24/12  9:15 AM      Result Value Range Status   Specimen Description BLOOD RIGHT HAND   Final   Special Requests BOTTLES DRAWN AEROBIC AND ANAEROBIC   Final   Culture  Setup Time 04/24/2012 14:04   Final   Culture     Final   Value:        BLOOD CULTURE RECEIVED NO GROWTH TO DATE CULTURE WILL BE HELD FOR 5 DAYS BEFORE ISSUING A FINAL NEGATIVE  REPORT   Report Status PENDING   Incomplete  CULTURE, BLOOD (ROUTINE X 2)     Status: None   Collection Time    04/24/12  9:15 AM      Result Value Range Status   Specimen Description BLOOD LEFT HAND   Final   Special Requests BOTTLES DRAWN AEROBIC AND ANAEROBIC   Final   Culture  Setup Time 04/24/2012 14:04  Final   Culture     Final   Value:        BLOOD CULTURE RECEIVED NO GROWTH TO DATE CULTURE WILL BE HELD FOR 5 DAYS BEFORE ISSUING A FINAL NEGATIVE REPORT   Report Status PENDING   Incomplete     Labs: Basic Metabolic Panel:  Recent Labs Lab 04/24/12 0915 04/25/12 0620  NA 130* 131*  K 3.9 4.0  CL 97 100  CO2 22 24  GLUCOSE 91 102*  BUN 16 13  CREATININE 1.29 1.08  CALCIUM 7.6* 7.5*   Liver Function Tests:  Recent Labs Lab 04/24/12 0915 04/25/12 0620  AST 31 24  ALT 60* 51  ALKPHOS 52 50  BILITOT 0.3 0.2*  PROT 5.0* 4.4*  ALBUMIN 2.2* 2.0*   CBC:  Recent Labs Lab 04/24/12 0915 04/25/12 0620  WBC 4.9 5.4  NEUTROABS 2.6  --   HGB 13.1 12.0*  HCT 37.8* 35.2*  MCV 82.9 83.0  PLT 280 264      Signed:  Austan Nicholl K  [facility] 04/25/2012, 3:04 PM

## 2012-04-25 NOTE — Progress Notes (Signed)
No blood return via PICC, IV team notified.

## 2012-04-26 NOTE — H&P (Signed)
Chris Obrien is an 62 y.o. male.    Chief Complaint:   Fever with persistent knee pain S/P right TKA & I&D prepatellar bursal infection from contact dermatitis from poison ivy   HPI: Pt is a 61 y.o. male complaining of right knee pain and fever.  He had a right total knee arthroplasty on 01/28/2012 after a history of pain and functional disability in the right knee due to arthritis and has failed non-surgical conservative treatments for greater than 12 weeks to includeNSAID's and/or analgesics, use of assistive devices and activity modification. Onset of symptoms was gradual, starting >10 years ago with gradually worsening course since that time. The patient noted prior procedures on the knee to include arthroscopy x3, last being in August of 2012, on the right knee(s).    Eight weeks after the knee replacement he was helping clear some land and had a contact dermatitis reaction to poison ivy. Attempts were made to treat this with oral antibiotics but he had persistent prepatellar swelling. He then underwent a I&D of the prepatellar bursa area on 04/02/2012, with treatment of IV antibiotics. His latest visit to the clinic reveals fever with persistent knee pain. Various options are discussed with the patient. Risks, benefits and expectations were discussed with the patient. Patient understand the risks, benefits and expectations and wishes to proceed with surgery.   PCP:  Sonda Primes, MD  D/C Plans: Home with HHPT / HHRN   Post-op Meds:  No Rx given.    Tranexamic Acid: To be given   Decadron:   Not to be given   FYI:  Will need a CPAP order after surgery  Previously given ASA, Robaxin, Iron, Colace and MiraLax    PMH: Past Medical History  Diagnosis Date  . Benign neoplasm of colon   . Insomnia, unspecified   . Oligospermia   . OSA on CPAP   . Swelling of joint of right knee   . Right knee skin infection   . BPH (benign prostatic hypertrophy)     hx small stream  . Arthritis    . Acute reactive arthritis per pcp note from 03-30-2012    right pre-patella---  secondary contact dermitis with poison ivy exposure 03-26-2012    PSH: Past Surgical History  Procedure Laterality Date  . Knee arthroscopy Right aug 2012  . Ulnar nerve repair Right 2008  . 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 Irrigation and debridement knee Right 04/02/2012    Procedure: IRRIGATION AND DEBRIDEMENT RIGHT PRE-PATELLA  ;  Surgeon: [name], MD;  Location: [facility];  Service: Orthopedics;  Laterality: Right;    Social History:  reports that he quit smoking about 4 years ago. His smoking use included Cigars. He has never used smokeless tobacco. He reports that  drinks alcohol. He reports that he does not use illicit drugs.  Allergies:  Allergies  Allergen Reactions  . Oxycodone Rash    Medications: No current facility-administered medications for this encounter.   Current Outpatient Prescriptions  Medication Sig Dispense Refill  . aspirin EC 325 MG EC tablet Take 1 tablet (325 mg total) by mouth daily with breakfast.  30 tablet    . ciprofloxacin (CIPRO) 400 MG/200ML SOLN Inject 200 mLs (400 mg total) into the vein every 12 (twelve) hours.  2000 mL  0  . diphenhydrAMINE (  BENADRYL) 25 mg capsule Take 50-75 mg by mouth every 6 (six) hours as needed for itching.      . folic acid (FOLVITE) 1 MG tablet Take 1 mg by mouth daily.      [name] HYDROcodone-acetaminophen (NORCO) 7.5-325 MG per tablet Take 1-2 tablets by mouth every 4 (four) hours as needed for pain.  120 tablet  0  . Multiple Vitamin (MULTIVITAMIN WITH MINERALS) TABS Take 1 tablet by mouth daily.      [name] orlistat (ALLI) 60 MG capsule Take 60 mg by mouth daily before breakfast.      . predniSONE (DELTASONE) 10 MG tablet 60mg  (6 tabs) po x 1 day, then 50mg  po on day 2, then 40 mg po  on day 3, then 30mg  po on day 4, then 20mg  po on day 5, then 10mg  po on day 6, then stop.  21 tablet  0  . tamsulosin (FLOMAX) 0.4 MG CAPS Take 0.4 mg by mouth daily.        Results for orders placed during the hospital encounter of 04/24/12 (from the past 48 hour(s))  LACTIC ACID, PLASMA     Status: None   Collection Time    04/25/12  6:20 AM      Result Value Range   Lactic Acid, Venous 2.0  0.5 - 2.2 mmol/L  COMPREHENSIVE METABOLIC PANEL     Status: Abnormal   Collection Time    04/25/12  6:20 AM      Result Value Range   Sodium 131 (*) 135 - 145 mEq/L   Potassium 4.0  3.5 - 5.1 mEq/L   Chloride 100  96 - 112 mEq/L   CO2 24  19 - 32 mEq/L   Glucose, Bld 102 (*) 70 - 99 mg/dL   BUN 13  6 - 23 mg/dL   Creatinine, Ser 0.45  0.50 - 1.35 mg/dL   Calcium 7.5 (*) 8.4 - 10.5 mg/dL   Total Protein 4.4 (*) 6.0 - 8.3 g/dL   Albumin 2.0 (*) 3.5 - 5.2 g/dL   AST 24  0 - 37 U/L   ALT 51  0 - 53 U/L   Alkaline Phosphatase 50  39 - 117 U/L   Total Bilirubin 0.2 (*) 0.3 - 1.2 mg/dL   GFR calc non Af Amer 71 (*) >90 mL/min   GFR calc Af Amer 82 (*) >90 mL/min   Comment:            The eGFR has been calculated     using the CKD EPI equation.     This calculation has not been     validated in all clinical     situations.     eGFR's persistently     <90 mL/min signify     possible Chronic Kidney Disease.  CBC     Status: Abnormal   Collection Time    04/25/12  6:20 AM      Result Value Range   WBC 5.4  4.0 - 10.5 K/uL   RBC 4.24  4.22 - 5.81 MIL/uL   Hemoglobin 12.0 (*) 13.0 - 17.0 g/dL   HCT 40.9 (*) 81.1 - 91.4 %   MCV 83.0  78.0 - 100.0 fL   MCH 28.3  26.0 - 34.0 pg   MCHC 34.1  30.0 - 36.0 g/dL   RDW 78.2  95.6 - 21.3 %   Platelets 264  150 - 400 K/uL   Review of Systems  Constitutional: Positive for fever,  chills and malaise/fatigue.  HENT: Negative.   Eyes: Negative.   Respiratory: Negative.   Cardiovascular: Negative.   Gastrointestinal: Negative.   Genitourinary:  Positive for urgency.  Musculoskeletal: Positive for joint pain (right knee).  Skin: Negative.   Neurological: Negative.   Endo/Heme/Allergies: Negative.   Psychiatric/Behavioral: Negative.      Physical Exam  Constitutional: He is oriented to person, place, and time and well-developed, well-nourished, and in no distress.  HENT:  Head: Normocephalic and atraumatic.  Mouth/Throat: Oropharynx is clear and moist.  Eyes: Pupils are equal, round, and reactive to light.  Neck: Neck supple. No JVD present. No tracheal deviation present. No thyromegaly present.  Cardiovascular: Normal rate, regular rhythm, normal heart sounds and intact distal pulses.   Pulmonary/Chest: Effort normal and breath sounds normal. No stridor. No respiratory distress. He has no wheezes.  Abdominal: Soft. There is no tenderness. There is no guarding.  Musculoskeletal:       Right knee: He exhibits decreased range of motion, swelling and laceration (from previous 2 surgeries). He exhibits no deformity and normal alignment. Tenderness found.  Lymphadenopathy:    He has no cervical adenopathy.  Neurological: He is alert and oriented to person, place, and time.  Skin: Skin is warm. There is erythema.  Psychiatric: Affect normal.      Assessment/Plan Assessment:   Fever with persistent knee pain S/P right TKA & I&D prepatellar bursal infection from contact dermatitis from poison ivy   Plan: Patient will undergo a resection of right total knee arthroplasty  on [id number] per Dr. Charlann Boxer at [facility]. Risks benefits and expectations were discussed with the patient. Patient understand risks, benefits and expectations and wishes to proceed.   Anastasio Auerbach Shristi Scheib   PAC  04/26/2012, 10:13 AM

## 2012-04-27 ENCOUNTER — Inpatient Hospital Stay (HOSPITAL_COMMUNITY)
Admission: RE | Admit: 2012-04-27 | Discharge: 2012-04-29 | DRG: 465 | Disposition: A | Discharge status: Home Health | Payer: PRIVATE HEALTH INSURANCE | Source: Ambulatory Visit | Attending: Orthopedic Surgery | Admitting: Orthopedic Surgery

## 2012-04-27 ENCOUNTER — Encounter (HOSPITAL_COMMUNITY)
Admission: RE | Disposition: A | Discharge status: Home Health | Payer: Self-pay | Source: Ambulatory Visit | Attending: Orthopedic Surgery

## 2012-04-27 ENCOUNTER — Inpatient Hospital Stay (HOSPITAL_COMMUNITY): Payer: PRIVATE HEALTH INSURANCE | Admitting: Anesthesiology

## 2012-04-27 ENCOUNTER — Encounter (HOSPITAL_COMMUNITY): Payer: Self-pay

## 2012-04-27 ENCOUNTER — Encounter (HOSPITAL_COMMUNITY): Payer: Self-pay | Admitting: Anesthesiology

## 2012-04-27 DIAGNOSIS — D649 Anemia, unspecified: Secondary | ICD-10-CM | POA: Diagnosis present

## 2012-04-27 DIAGNOSIS — M7041 Prepatellar bursitis, right knee: Secondary | ICD-10-CM

## 2012-04-27 DIAGNOSIS — R21 Rash and other nonspecific skin eruption: Secondary | ICD-10-CM | POA: Diagnosis present

## 2012-04-27 DIAGNOSIS — Y831 Surgical operation with implant of artificial internal device as the cause of abnormal reaction of the patient, or of later complication, without mention of misadventure at the time of the procedure: Secondary | ICD-10-CM | POA: Diagnosis present

## 2012-04-27 DIAGNOSIS — Z89529 Acquired absence of unspecified knee: Secondary | ICD-10-CM

## 2012-04-27 DIAGNOSIS — N400 Enlarged prostate without lower urinary tract symptoms: Secondary | ICD-10-CM | POA: Diagnosis present

## 2012-04-27 DIAGNOSIS — Z87891 Personal history of nicotine dependence: Secondary | ICD-10-CM

## 2012-04-27 DIAGNOSIS — G4733 Obstructive sleep apnea (adult) (pediatric): Secondary | ICD-10-CM | POA: Diagnosis present

## 2012-04-27 DIAGNOSIS — T8450XA Infection and inflammatory reaction due to unspecified internal joint prosthesis, initial encounter: Principal | ICD-10-CM | POA: Diagnosis present

## 2012-04-27 DIAGNOSIS — Z79899 Other long term (current) drug therapy: Secondary | ICD-10-CM

## 2012-04-27 DIAGNOSIS — Z96659 Presence of unspecified artificial knee joint: Secondary | ICD-10-CM

## 2012-04-27 HISTORY — PX: EXCISIONAL TOTAL KNEE ARTHROPLASTY WITH ANTIBIOTIC SPACERS: SHX5827

## 2012-04-27 LAB — SYNOVIAL CELL COUNT + DIFF, W/ CRYSTALS
Crystals, Fluid: NONE SEEN
Eosinophils-Synovial: 2 % — ABNORMAL HIGH (ref 0–1)
Lymphocytes-Synovial Fld: 17 % (ref 0–20)
Monocyte-Macrophage-Synovial Fluid: 24 % — ABNORMAL LOW (ref 50–90)
Neutrophil, Synovial: 57 % — ABNORMAL HIGH (ref 0–25)
Other Cells-SYN: 0
WBC, Synovial: 4630 /mm3 — ABNORMAL HIGH (ref 0–200)

## 2012-04-27 LAB — APTT: aPTT: 35 seconds (ref 24–37)

## 2012-04-27 LAB — BASIC METABOLIC PANEL
BUN: 16 mg/dL (ref 6–23)
CO2: 26 mEq/L (ref 19–32)
Calcium: 8.5 mg/dL (ref 8.4–10.5)
Chloride: 105 mEq/L (ref 96–112)
Creatinine, Ser: 0.99 mg/dL (ref 0.50–1.35)
GFR calc Af Amer: >90 mL/min (ref >90)
GFR calc non Af Amer: 85 mL/min — ABNORMAL LOW (ref >90)
Glucose, Bld: 100 mg/dL — ABNORMAL HIGH (ref 70–99)
Potassium: 4.3 mEq/L (ref 3.5–5.1)
Sodium: 140 mEq/L (ref 135–145)

## 2012-04-27 LAB — CBC
HCT: 34.8 % — ABNORMAL LOW (ref 39.0–52.0)
Hemoglobin: 11.6 g/dL — ABNORMAL LOW (ref 13.0–17.0)
MCH: 28.2 pg (ref 26.0–34.0)
MCHC: 33.3 g/dL (ref 30.0–36.0)
MCV: 84.7 fL (ref 78.0–100.0)
Platelets: 339 10*3/uL (ref 150–400)
RBC: 4.11 MIL/uL — ABNORMAL LOW (ref 4.22–5.81)
RDW: 14.1 % (ref 11.5–15.5)
WBC: 11.9 10*3/uL — ABNORMAL HIGH (ref 4.0–10.5)

## 2012-04-27 LAB — URINALYSIS, ROUTINE W REFLEX MICROSCOPIC
Bilirubin Urine: NEGATIVE
Glucose, UA: NEGATIVE mg/dL
Hgb urine dipstick: NEGATIVE
Ketones, ur: NEGATIVE mg/dL
Leukocytes, UA: NEGATIVE
Nitrite: NEGATIVE
Protein, ur: NEGATIVE mg/dL
Specific Gravity, Urine: 1.023 (ref 1.005–1.030)
Urobilinogen, UA: 0.2 mg/dL (ref 0.0–1.0)
pH: 5.5 (ref 5.0–8.0)

## 2012-04-27 LAB — TYPE AND SCREEN
ABO/RH(D): A POS
Antibody Screen: NEGATIVE

## 2012-04-27 LAB — GRAM STAIN

## 2012-04-27 LAB — PROTIME-INR
INR: 1.35 (ref 0.00–1.49)
Prothrombin Time: 16.4 seconds — ABNORMAL HIGH (ref 11.6–15.2)

## 2012-04-27 LAB — SURGICAL PCR SCREEN
MRSA, PCR: NEGATIVE
Staphylococcus aureus: NEGATIVE

## 2012-04-27 SURGERY — REMOVAL, TOTAL ARTHROPLASTY HARDWARE, KNEE, WITH ANTIBIOTIC SPACER INSERTION
Anesthesia: General | Site: Knee | Laterality: Right | Wound class: Dirty or Infected

## 2012-04-27 MED ORDER — CEFAZOLIN SODIUM-DEXTROSE 2-3 GM-% IV SOLR
2.0000 g | Freq: Four times a day (QID) | INTRAVENOUS | Status: AC
  Administered 2012-04-27 – 2012-04-28 (×2): 2 g via INTRAVENOUS
  Filled 2012-04-27 (×2): qty 50

## 2012-04-27 MED ORDER — HYDROMORPHONE HCL PF 1 MG/ML IJ SOLN: 0.2500 mg | INTRAMUSCULAR | Status: DC | PRN

## 2012-04-27 MED ORDER — TOBRAMYCIN SULFATE 1.2 G IJ SOLR
1.2000 g | INTRAMUSCULAR | Status: AC
  Administered 2012-04-27: 1.2 g via TOPICAL

## 2012-04-27 MED ORDER — LACTATED RINGERS IV SOLN
INTRAVENOUS | Status: DC
  Administered 2012-04-27 (×3): via INTRAVENOUS

## 2012-04-27 MED ORDER — METOCLOPRAMIDE HCL 5 MG/ML IJ SOLN: 5.0000 mg | Freq: Three times a day (TID) | INTRAMUSCULAR | Status: DC | PRN

## 2012-04-27 MED ORDER — METHOCARBAMOL 100 MG/ML IJ SOLN: 500.0000 mg | Freq: Four times a day (QID) | INTRAVENOUS | Status: DC | PRN

## 2012-04-27 MED ORDER — BUPIVACAINE LIPOSOME 1.3 % IJ SUSP
20.0000 mL | Freq: Once | INTRAMUSCULAR | Status: DC
  Filled 2012-04-27: qty 20

## 2012-04-27 MED ORDER — SUCCINYLCHOLINE CHLORIDE 20 MG/ML IJ SOLN
INTRAMUSCULAR | Status: DC | PRN
  Administered 2012-04-27: 100 mg via INTRAVENOUS

## 2012-04-27 MED ORDER — SODIUM CHLORIDE 0.9 % IV SOLN
INTRAVENOUS | Status: DC
  Administered 2012-04-27 – 2012-04-29 (×3): via INTRAVENOUS
  Filled 2012-04-27 (×8): qty 1000

## 2012-04-27 MED ORDER — ONDANSETRON HCL 4 MG PO TABS: 4.0000 mg | ORAL_TABLET | Freq: Four times a day (QID) | ORAL | Status: DC | PRN

## 2012-04-27 MED ORDER — ONDANSETRON HCL 4 MG/2ML IJ SOLN
INTRAMUSCULAR | Status: DC | PRN
  Administered 2012-04-27: 4 mg via INTRAVENOUS

## 2012-04-27 MED ORDER — DOCUSATE SODIUM 100 MG PO CAPS
100.0000 mg | ORAL_CAPSULE | Freq: Two times a day (BID) | ORAL | Status: DC
  Administered 2012-04-27 – 2012-04-28 (×2): 100 mg via ORAL

## 2012-04-27 MED ORDER — ALUM & MAG HYDROXIDE-SIMETH 200-200-20 MG/5ML PO SUSP: 30.0000 mL | ORAL | Status: DC | PRN

## 2012-04-27 MED ORDER — LIDOCAINE HCL (CARDIAC) 20 MG/ML IV SOLN
INTRAVENOUS | Status: DC | PRN
  Administered 2012-04-27: 100 mg via INTRAVENOUS

## 2012-04-27 MED ORDER — TAMSULOSIN HCL 0.4 MG PO CAPS
0.4000 mg | ORAL_CAPSULE | Freq: Every day | ORAL | Status: DC
  Administered 2012-04-27 – 2012-04-29 (×3): 0.4 mg via ORAL
  Filled 2012-04-27 (×3): qty 1

## 2012-04-27 MED ORDER — HYDROCODONE-ACETAMINOPHEN 7.5-325 MG PO TABS
1.0000 | ORAL_TABLET | ORAL | Status: DC
  Administered 2012-04-27: 2 via ORAL
  Administered 2012-04-28: 1 via ORAL
  Administered 2012-04-28 (×3): 2 via ORAL
  Administered 2012-04-28: 1 via ORAL
  Administered 2012-04-28: 2 via ORAL
  Administered 2012-04-29 (×3): 1 via ORAL
  Filled 2012-04-27: qty 1
  Filled 2012-04-27 (×2): qty 2
  Filled 2012-04-27: qty 1
  Filled 2012-04-27: qty 2
  Filled 2012-04-27: qty 1
  Filled 2012-04-27 (×2): qty 2
  Filled 2012-04-27: qty 1
  Filled 2012-04-27: qty 2
  Filled 2012-04-27: qty 1

## 2012-04-27 MED ORDER — DIPHENHYDRAMINE HCL 50 MG/ML IJ SOLN
INTRAMUSCULAR | Status: DC | PRN
  Administered 2012-04-27 (×2): 25 mg via INTRAVENOUS

## 2012-04-27 MED ORDER — RIVAROXABAN 10 MG PO TABS
10.0000 mg | ORAL_TABLET | ORAL | Status: DC
  Administered 2012-04-28 – 2012-04-29 (×2): 10 mg via ORAL
  Filled 2012-04-27 (×3): qty 1

## 2012-04-27 MED ORDER — HYDROMORPHONE HCL PF 1 MG/ML IJ SOLN
INTRAMUSCULAR | Status: DC | PRN
  Administered 2012-04-27: 1 mg via INTRAVENOUS
  Administered 2012-04-27 (×2): .5 mg via INTRAVENOUS
  Administered 2012-04-27: 1 mg via INTRAVENOUS

## 2012-04-27 MED ORDER — CELECOXIB 200 MG PO CAPS
200.0000 mg | ORAL_CAPSULE | Freq: Two times a day (BID) | ORAL | Status: DC
  Administered 2012-04-27 – 2012-04-29 (×4): 200 mg via ORAL
  Filled 2012-04-27 (×5): qty 1

## 2012-04-27 MED ORDER — ONDANSETRON HCL 4 MG/2ML IJ SOLN: 4.0000 mg | Freq: Four times a day (QID) | INTRAMUSCULAR | Status: DC | PRN

## 2012-04-27 MED ORDER — CEFAZOLIN SODIUM-DEXTROSE 2-3 GM-% IV SOLR
2.0000 g | INTRAVENOUS | Status: AC
  Administered 2012-04-27: 2 g via INTRAVENOUS

## 2012-04-27 MED ORDER — DAPTOMYCIN 500 MG IV SOLR
6.0000 mg/kg | INTRAVENOUS | Status: AC
  Administered 2012-04-27: 645 mg via INTRAVENOUS
  Filled 2012-04-27: qty 12.9

## 2012-04-27 MED ORDER — LACTATED RINGERS IV SOLN
INTRAVENOUS | Status: DC
  Administered 2012-04-27: 1000 mL via INTRAVENOUS

## 2012-04-27 MED ORDER — MEPERIDINE HCL 50 MG/ML IJ SOLN: 6.2500 mg | INTRAMUSCULAR | Status: DC | PRN

## 2012-04-27 MED ORDER — MUPIROCIN 2 % EX OINT
TOPICAL_OINTMENT | Freq: Once | CUTANEOUS | Status: AC
  Administered 2012-04-27: 13:00:00 via NASAL
  Filled 2012-04-27 (×2): qty 22

## 2012-04-27 MED ORDER — HYDROMORPHONE HCL PF 1 MG/ML IJ SOLN: 0.5000 mg | INTRAMUSCULAR | Status: DC | PRN

## 2012-04-27 MED ORDER — PHENYLEPHRINE HCL 10 MG/ML IJ SOLN
INTRAMUSCULAR | Status: DC | PRN
  Administered 2012-04-27: 160 ug via INTRAVENOUS
  Administered 2012-04-27 (×3): 80 ug via INTRAVENOUS

## 2012-04-27 MED ORDER — ACETAMINOPHEN 10 MG/ML IV SOLN
INTRAVENOUS | Status: DC | PRN
  Administered 2012-04-27: 1000 mg via INTRAVENOUS

## 2012-04-27 MED ORDER — ZOLPIDEM TARTRATE 5 MG PO TABS
5.0000 mg | ORAL_TABLET | Freq: Every evening | ORAL | Status: DC | PRN
  Administered 2012-04-28: 5 mg via ORAL
  Filled 2012-04-27: qty 1

## 2012-04-27 MED ORDER — BISACODYL 10 MG RE SUPP: 10.0000 mg | Freq: Every day | RECTAL | Status: DC | PRN

## 2012-04-27 MED ORDER — SUFENTANIL CITRATE 50 MCG/ML IV SOLN
INTRAVENOUS | Status: DC | PRN
  Administered 2012-04-27: 5 ug via INTRAVENOUS
  Administered 2012-04-27 (×2): 10 ug via INTRAVENOUS
  Administered 2012-04-27 (×2): 5 ug via INTRAVENOUS
  Administered 2012-04-27: 10 ug via INTRAVENOUS
  Administered 2012-04-27: 15 ug via INTRAVENOUS

## 2012-04-27 MED ORDER — SODIUM CHLORIDE 0.9 % IR SOLN
Status: DC | PRN
  Administered 2012-04-27 (×4): 3000 mL

## 2012-04-27 MED ORDER — POLYETHYLENE GLYCOL 3350 17 G PO PACK
17.0000 g | PACK | Freq: Two times a day (BID) | ORAL | Status: DC
  Administered 2012-04-27 – 2012-04-28 (×2): 17 g via ORAL

## 2012-04-27 MED ORDER — DIPHENHYDRAMINE HCL 25 MG PO CAPS
50.0000 mg | ORAL_CAPSULE | Freq: Four times a day (QID) | ORAL | Status: DC | PRN
  Administered 2012-04-27 – 2012-04-29 (×3): 50 mg via ORAL
  Filled 2012-04-27 (×3): qty 2

## 2012-04-27 MED ORDER — ROCURONIUM BROMIDE 100 MG/10ML IV SOLN
INTRAVENOUS | Status: DC | PRN
  Administered 2012-04-27: 5 mg via INTRAVENOUS

## 2012-04-27 MED ORDER — DEXAMETHASONE SODIUM PHOSPHATE 10 MG/ML IJ SOLN
INTRAMUSCULAR | Status: DC | PRN
  Administered 2012-04-27: 8 mg via INTRAVENOUS

## 2012-04-27 MED ORDER — TOBRAMYCIN SULFATE 1.2 G IJ SOLR
INTRAMUSCULAR | Status: DC | PRN
  Administered 2012-04-27 (×4): 1.2 g

## 2012-04-27 MED ORDER — PROMETHAZINE HCL 25 MG/ML IJ SOLN: 6.2500 mg | INTRAMUSCULAR | Status: DC | PRN

## 2012-04-27 MED ORDER — MIDAZOLAM HCL 5 MG/5ML IJ SOLN
INTRAMUSCULAR | Status: DC | PRN
  Administered 2012-04-27: 2 mg via INTRAVENOUS

## 2012-04-27 MED ORDER — ACETAMINOPHEN 10 MG/ML IV SOLN: 1000.0000 mg | Freq: Once | INTRAVENOUS | Status: DC | PRN

## 2012-04-27 MED ORDER — METHOCARBAMOL 500 MG PO TABS
500.0000 mg | ORAL_TABLET | Freq: Four times a day (QID) | ORAL | Status: DC | PRN
  Administered 2012-04-28 (×2): 500 mg via ORAL
  Filled 2012-04-27 (×2): qty 1

## 2012-04-27 MED ORDER — MENTHOL 3 MG MT LOZG: 1.0000 | LOZENGE | OROMUCOSAL | Status: DC | PRN

## 2012-04-27 MED ORDER — PROPOFOL 10 MG/ML IV BOLUS
INTRAVENOUS | Status: DC | PRN
  Administered 2012-04-27: 200 mg via INTRAVENOUS

## 2012-04-27 MED ORDER — PHENOL 1.4 % MT LIQD: 1.0000 | OROMUCOSAL | Status: DC | PRN

## 2012-04-27 MED ORDER — CHLORHEXIDINE GLUCONATE 4 % EX LIQD
60.0000 mL | Freq: Once | CUTANEOUS | Status: DC
  Filled 2012-04-27: qty 60

## 2012-04-27 MED ORDER — METOCLOPRAMIDE HCL 10 MG PO TABS: 5.0000 mg | ORAL_TABLET | Freq: Three times a day (TID) | ORAL | Status: DC | PRN

## 2012-04-27 MED ORDER — FERROUS SULFATE 325 (65 FE) MG PO TABS
325.0000 mg | ORAL_TABLET | Freq: Three times a day (TID) | ORAL | Status: DC
  Administered 2012-04-28 – 2012-04-29 (×4): 325 mg via ORAL
  Filled 2012-04-27 (×7): qty 1

## 2012-04-27 MED ORDER — FLEET ENEMA 7-19 GM/118ML RE ENEM: 1.0000 | ENEMA | Freq: Once | RECTAL | Status: AC | PRN

## 2012-04-27 MED ORDER — TRANEXAMIC ACID 100 MG/ML IV SOLN
1000.0000 mg | Freq: Once | INTRAVENOUS | Status: AC
  Administered 2012-04-27: 1000 mg via INTRAVENOUS
  Filled 2012-04-27: qty 10

## 2012-04-27 MED ORDER — SODIUM CHLORIDE 0.9 % IJ SOLN
10.0000 mL | INTRAMUSCULAR | Status: DC | PRN
  Administered 2012-04-27 – 2012-04-29 (×4): 10 mL

## 2012-04-27 SURGICAL SUPPLY — 73 items
ADH SKN CLS APL DERMABOND .7 (GAUZE/BANDAGES/DRESSINGS) ×1
BAG SPEC THK2 15X12 ZIP CLS (MISCELLANEOUS) ×1
BAG ZIPLOCK 12X15 (MISCELLANEOUS) ×2 IMPLANT
BANDAGE ELASTIC 6 VELCRO ST LF (GAUZE/BANDAGES/DRESSINGS) ×3 IMPLANT
BANDAGE ESMARK 6X9 LF (GAUZE/BANDAGES/DRESSINGS) ×1 IMPLANT
BLADE SAW SGTL 13.0X1.19X90.0M (BLADE) ×3 IMPLANT
BLADE SAW SGTL 81X20 HD (BLADE) ×2 IMPLANT
BNDG CMPR 9X6 STRL LF SNTH (GAUZE/BANDAGES/DRESSINGS) ×1
BNDG ESMARK 6X9 LF (GAUZE/BANDAGES/DRESSINGS) ×2
BONE CEMENT GENTAMICIN (Cement) ×2 IMPLANT
BOWL SMART MIX CTS (DISPOSABLE) IMPLANT
CEMENT BONE GENTAMICIN 40 (Cement) ×3 IMPLANT
CEMENT HV SMART SET (Cement) ×8 IMPLANT
CLOTH BEACON ORANGE TIMEOUT ST (SAFETY) ×2 IMPLANT
CUFF TOURN SGL QUICK 34 (TOURNIQUET CUFF) ×2
CUFF TRNQT CYL 34X4X40X1 (TOURNIQUET CUFF) ×1 IMPLANT
DERMABOND ADVANCED (GAUZE/BANDAGES/DRESSINGS) ×1
DERMABOND ADVANCED .7 DNX12 (GAUZE/BANDAGES/DRESSINGS) ×1 IMPLANT
DRAPE EXTREMITY T 121X128X90 (DRAPE) ×2 IMPLANT
DRAPE POUCH INSTRU U-SHP 10X18 (DRAPES) ×2 IMPLANT
DRAPE U-SHAPE 47X51 STRL (DRAPES) ×2 IMPLANT
DRSG ADAPTIC 3X8 NADH LF (GAUZE/BANDAGES/DRESSINGS) ×2 IMPLANT
DRSG AQUACEL AG ADV 3.5X10 (GAUZE/BANDAGES/DRESSINGS) ×2 IMPLANT
DRSG PAD ABDOMINAL 8X10 ST (GAUZE/BANDAGES/DRESSINGS) ×2 IMPLANT
DRSG TEGADERM 4X4.75 (GAUZE/BANDAGES/DRESSINGS) ×2 IMPLANT
DURAPREP 26ML APPLICATOR (WOUND CARE) ×2 IMPLANT
ELECT REM PT RETURN 9FT ADLT (ELECTROSURGICAL) ×2
ELECTRODE REM PT RTRN 9FT ADLT (ELECTROSURGICAL) ×1 IMPLANT
EVACUATOR 1/8 PVC DRAIN (DRAIN) ×2 IMPLANT
FACESHIELD LNG OPTICON STERILE (SAFETY) ×10 IMPLANT
GAUZE SPONGE 2X2 8PLY STRL LF (GAUZE/BANDAGES/DRESSINGS) ×1 IMPLANT
GAUZE XEROFORM 1X8 LF (GAUZE/BANDAGES/DRESSINGS) ×1 IMPLANT
GLOVE BIOGEL PI IND STRL 7.5 (GLOVE) ×1 IMPLANT
GLOVE BIOGEL PI IND STRL 8 (GLOVE) ×2 IMPLANT
GLOVE BIOGEL PI INDICATOR 7.5 (GLOVE) ×1
GLOVE BIOGEL PI INDICATOR 8 (GLOVE) ×2
GLOVE ORTHO TXT STRL SZ7.5 (GLOVE) ×4 IMPLANT
GLOVE SURG ORTHO 8.0 STRL STRW (GLOVE) ×2 IMPLANT
GOWN BRE IMP PREV XXLGXLNG (GOWN DISPOSABLE) ×4 IMPLANT
GOWN STRL NON-REIN LRG LVL3 (GOWN DISPOSABLE) ×2 IMPLANT
HANDPIECE INTERPULSE COAX TIP (DISPOSABLE) ×2
IMMOBILIZER KNEE 20 (SOFTGOODS)
IMMOBILIZER KNEE 20 THIGH 36 (SOFTGOODS) IMPLANT
KIT BASIN OR (CUSTOM PROCEDURE TRAY) ×2 IMPLANT
KIT OPTIVAC CEM 80GM DBL MIX (Cement) ×1 IMPLANT
MANIFOLD NEPTUNE II (INSTRUMENTS) ×2 IMPLANT
MOLD CEMENT FEMORAL 70MM ×1 IMPLANT
MOLD CEMENT TIBIAL 80MM (Orthopedic Implant) ×1 IMPLANT
NDL SAFETY ECLIPSE 18X1.5 (NEEDLE) ×1 IMPLANT
NEEDLE HYPO 18GX1.5 SHARP (NEEDLE) ×2
NS IRRIG 1000ML POUR BTL (IV SOLUTION) ×2 IMPLANT
PACK TOTAL JOINT (CUSTOM PROCEDURE TRAY) ×2 IMPLANT
PADDING CAST COTTON 6X4 STRL (CAST SUPPLIES) ×3 IMPLANT
POSITIONER SURGICAL ARM (MISCELLANEOUS) ×2 IMPLANT
SET HNDPC FAN SPRY TIP SCT (DISPOSABLE) ×1 IMPLANT
SET PAD KNEE POSITIONER (MISCELLANEOUS) ×2 IMPLANT
SPONGE GAUZE 2X2 STER 10/PKG (GAUZE/BANDAGES/DRESSINGS) ×1
SPONGE GAUZE 4X4 12PLY (GAUZE/BANDAGES/DRESSINGS) ×3 IMPLANT
SPONGE LAP 18X18 X RAY DECT (DISPOSABLE) ×2 IMPLANT
STAPLER VISISTAT 35W (STAPLE) IMPLANT
SUCTION FRAZIER 12FR DISP (SUCTIONS) ×2 IMPLANT
SUT PDS AB 1 CT1 27 (SUTURE) IMPLANT
SUT VIC AB 1 CT1 36 (SUTURE) ×6 IMPLANT
SUT VIC AB 2-0 CT1 27 (SUTURE) ×6
SUT VIC AB 2-0 CT1 TAPERPNT 27 (SUTURE) ×3 IMPLANT
SWAB COLLECTION DEVICE MRSA (MISCELLANEOUS) ×1 IMPLANT
SYR 50ML LL SCALE MARK (SYRINGE) ×2 IMPLANT
TOWEL OR 17X26 10 PK STRL BLUE (TOWEL DISPOSABLE) ×4 IMPLANT
TOWER CARTRIDGE SMART MIX (DISPOSABLE) ×2 IMPLANT
TRAY FOLEY CATH 14FRSI W/METER (CATHETERS) ×2 IMPLANT
TUBE ANAEROBIC SPECIMEN COL (MISCELLANEOUS) ×1 IMPLANT
WATER STERILE IRR 1500ML POUR (IV SOLUTION) ×2 IMPLANT
WRAP KNEE MAXI GEL POST OP (GAUZE/BANDAGES/DRESSINGS) ×2 IMPLANT

## 2012-04-27 NOTE — Anesthesia Preprocedure Evaluation (Addendum)
Anesthesia Evaluation  Patient identified by MRN, date of birth, ID band Patient awake    Reviewed: Allergy & Precautions, H&P , NPO status , Patient's Chart, lab work & pertinent test results  Airway Mallampati: III TM Distance: >3 FB Neck ROM: full    Dental no notable dental hx. (+) Teeth Intact and Dental Advisory Given   Pulmonary shortness of breath and with exertion, sleep apnea and Continuous Positive Airway Pressure Ventilation , former smoker,  breath sounds clear to auscultation  Pulmonary exam normal       Cardiovascular Exercise Tolerance: Good negative cardio ROS  + dysrhythmias Rhythm:regular Rate:Normal     Neuro/Psych negative neurological ROS  negative psych ROS   GI/Hepatic negative GI ROS, Neg liver ROS,   Endo/Other  negative endocrine ROS  Renal/GU negative Renal ROS     Musculoskeletal  (+) Arthritis -, Osteoarthritis,    Abdominal   Peds  Hematology negative hematology ROS (+)   Anesthesia Other Findings   Reproductive/Obstetrics                           Anesthesia Physical  Anesthesia Plan  ASA: III  Anesthesia Plan: General   Post-op Pain Management:    Induction: Intravenous  Airway Management Planned: Oral ETT  Additional Equipment:   Intra-op Plan:   Post-operative Plan: Extubation in OR  Informed Consent: I have reviewed the patients History and Physical, chart, labs and discussed the procedure including the risks, benefits and alternatives for the proposed anesthesia with the patient or authorized representative who has indicated his/her understanding and acceptance.   Dental Advisory Given  Plan Discussed with: CRNA  Anesthesia Plan Comments:        Anesthesia Quick Evaluation

## 2012-04-27 NOTE — Progress Notes (Signed)
ANTIBIOTIC CONSULT NOTE - INITIAL  Pharmacy Consult for Daptomycin Indication: Infected knee joint  Allergies  Allergen Reactions  . Vancomycin Rash    Severe redness all over body with swollen ankles and hands  . Oxycodone Rash    Patient Measurements: Height: 6' 0.83" (185 cm) (Documented 04/02/12) Weight: 236 lb 15.9 oz (107.5 kg) (Documented 04/02/12) IBW/kg (Calculated) : 79.52 Adjusted Body Weight:   Vital Signs: Temp: 97.5 F (36.4 C) (04/21 1952) Temp src: Oral (04/21 1144) BP: 115/75 mmHg (04/21 1952) Pulse Rate: 81 (04/21 1952) Intake/Output from previous day:   Intake/Output from this shift: Total I/O In: 322 [P.O.:222; I.V.:100] Out: -   Labs:  Recent Labs  04/25/12 0620 04/27/12 1327  WBC 5.4 11.9*  HGB 12.0* 11.6*  PLT 264 339  CREATININE 1.08 0.99   Estimated Creatinine Clearance: 98 ml/min (by C-G formula based on Cr of 0.99). No results found for this basename: VANCOTROUGH, Leodis Binet, VANCORANDOM, GENTTROUGH, GENTPEAK, GENTRANDOM, TOBRATROUGH, TOBRAPEAK, TOBRARND, AMIKACINPEAK, AMIKACINTROU, AMIKACIN,  in the last 72 hours   Microbiology: Recent Results (from the past 720 hour(s))  BODY FLUID CULTURE     Status: None   Collection Time    03/30/12  1:08 PM      Result Value Range Status   Culture Rare STAPHYLOCOCCUS AUREUS   Final   GRAM STAIN WBC present-both PMN and Mononuclear   Final   GRAM STAIN No Organisms Seen   Final   Organism ID, Bacteria STAPHYLOCOCCUS AUREUS   Final   Comment: Critical Results Called to,Read Back By     and Verified With:     CYNTHIA @ 1147 03/31/12 BY KRAWS     Rifampin and Gentamicin should not be used as     single drugs for treatment of Staph infections.  CULTURE, ROUTINE-ABSCESS     Status: None   Collection Time    04/02/12  9:45 AM      Result Value Range Status   Specimen Description ABSCESS RIGHT KNEE PREPATELLAR   Final   Special Requests PATIENT ON FOLLOWING AUGMENTIN SEPTRA   Final   Gram Stain      Final   Value: ABUNDANT WBC PRESENT,BOTH PMN AND MONONUCLEAR     NO ORGANISMS SEEN     Gram Stain Report Called to,Read Back By and Verified With: Gram Stain Report Called to,Read Back By and Verified With: D Freida Busman RN AT 1050 04/02/12 BY SHUEA Performed by [facility]   Culture NO GROWTH 3 DAYS   Final   Report Status 04/05/2012 FINAL   Final  ANAEROBIC CULTURE     Status: None   Collection Time    04/02/12  9:45 AM      Result Value Range Status   Specimen Description ABSCESS RIGHT KNEE PREPATELLAR   Final   Special Requests PATIENT ON FOLLOWING AUGMENTIN SEPTRA   Final   Gram Stain     Final   Value: FEW WBC PRESENT,BOTH PMN AND MONONUCLEAR     NO ORGANISMS SEEN   Culture NO ANAEROBES ISOLATED   Final   Report Status 04/07/2012 FINAL   Final  GRAM STAIN     Status: None   Collection Time    04/02/12  9:45 AM      Result Value Range Status   Specimen Description ABSCESS RIGHT KNEE PREPATELLAR   Final   Special Requests PATIENT ON FOLLOWING AUGMENTIN SEPTRA   Final   Gram Stain     Final  Value: ABUNDANT     WBC PRESENT,BOTH PMN AND MONONUCLEAR     NO ORGANISMS SEEN     Gram Stain Report Called to,Read Back By and Verified With: D. ALLEN RN AT 1050 ON 03.27.14 BY SHUEA.   Report Status 04/02/2012 FINAL   Final  CULTURE, BLOOD (ROUTINE X 2)     Status: None   Collection Time    04/24/12  9:15 AM      Result Value Range Status   Specimen Description BLOOD RIGHT HAND   Final   Special Requests BOTTLES DRAWN AEROBIC AND ANAEROBIC   Final   Culture  Setup Time 04/24/2012 14:04   Final   Culture     Final   Value:        BLOOD CULTURE RECEIVED NO GROWTH TO DATE CULTURE WILL BE HELD FOR 5 DAYS BEFORE ISSUING A FINAL NEGATIVE REPORT   Report Status PENDING   Incomplete  CULTURE, BLOOD (ROUTINE X 2)     Status: None   Collection Time    04/24/12  9:15 AM      Result Value Range Status   Specimen Description BLOOD LEFT HAND   Final   Special Requests BOTTLES DRAWN  AEROBIC AND ANAEROBIC   Final   Culture  Setup Time 04/24/2012 14:04   Final   Culture     Final   Value:        BLOOD CULTURE RECEIVED NO GROWTH TO DATE CULTURE WILL BE HELD FOR 5 DAYS BEFORE ISSUING A FINAL NEGATIVE REPORT   Report Status PENDING   Incomplete  URINE CULTURE     Status: None   Collection Time    04/24/12  9:30 AM      Result Value Range Status   Specimen Description URINE, CLEAN CATCH   Final   Special Requests NONE   Final   Culture  Setup Time 04/24/2012 14:20   Final   Colony Count NO GROWTH   Final   Culture NO GROWTH   Final   Report Status 04/25/2012 FINAL   Final  SURGICAL PCR SCREEN     Status: None   Collection Time    04/27/12 11:57 AM      Result Value Range Status   MRSA, PCR NEGATIVE  NEGATIVE Final   Staphylococcus aureus NEGATIVE  NEGATIVE Final   Comment:            The Xpert SA Assay (FDA     approved for NASAL specimens     in patients over 69 years of age),     is one component of     a comprehensive surveillance     program.  Test performance has     been validated by [employer] for patients greater     than or equal to 61 year old.     It is not intended     to diagnose infection nor to     guide or monitor treatment.  GRAM STAIN     Status: None   Collection Time    04/27/12  4:24 PM      Result Value Range Status   Specimen Description KNEE RIGHT   Final   Special Requests NONE   Final   Gram Stain     Final   Value: FEW WBC SEEN     NO ORGANISMS SEEN     Gram Stain Report Called to,Read Back By and Verified With:  ALLRED,W. AT 1807 ON [id number] BY LOVE,T.   Report Status 04/27/2012 FINAL   Final    Medical History: Past Medical History  Diagnosis Date  . Benign neoplasm of colon   . Insomnia, unspecified   . Oligospermia   . OSA on CPAP   . Swelling of joint of right knee   . Right knee skin infection   . BPH (benign prostatic hypertrophy)     hx small stream  . Arthritis   . Acute reactive arthritis per pcp  note from 03-30-2012    right pre-patella---  secondary contact dermitis with poison ivy exposure 03-26-2012    Medications:  Scheduled:  . [COMPLETED]  ceFAZolin (ANCEF) IV  2 g Intravenous On Call to OR  .  ceFAZolin (ANCEF) IV  2 g Intravenous Q6H  . celecoxib  200 mg Oral Q12H  . DAPTOmycin (CUBICIN)  IV  6 mg/kg Intravenous Q24H  . docusate sodium  100 mg Oral BID  . [START ON 04/28/2012] ferrous sulfate  325 mg Oral TID PC  . HYDROcodone-acetaminophen  1-2 tablet Oral Q4H  . [COMPLETED] mupirocin ointment   Nasal Once  . polyethylene glycol  17 g Oral BID  . [START ON 04/28/2012] rivaroxaban  10 mg Oral Q24H  . tamsulosin  0.4 mg Oral Daily  . [COMPLETED] tobramycin  1.2 g Topical To OR  . [COMPLETED] tobramycin  1.2 g Topical To OR  . [COMPLETED] tranexamic acid (CYLOKAPRON) IVPB (ORTHO)  1,000 mg Intravenous Once  . [DISCONTINUED] bupivacaine liposome  20 mL Infiltration Once  . [DISCONTINUED] chlorhexidine  60 mL Topical Once   Infusions:  . lactated ringers    . sodium chloride 0.9 % 1,000 mL with potassium chloride 10 mEq infusion    . [DISCONTINUED] lactated ringers 1,000 mL (04/27/12 1430)   PRN: alum & mag hydroxide-simeth, bisacodyl, diphenhydrAMINE, HYDROmorphone (DILAUDID) injection, menthol-cetylpyridinium, methocarbamol, metoCLOPramide (REGLAN) injection, metoCLOPramide, ondansetron (ZOFRAN) IV, ondansetron, phenol, sodium chloride, sodium phosphate, zolpidem, [DISCONTINUED] acetaminophen, [DISCONTINUED]  HYDROmorphone (DILAUDID) injection, [DISCONTINUED] meperidine (DEMEROL) injection, [DISCONTINUED] methocarbamol (ROBAXIN) IV [DISCONTINUED] promethazine, [DISCONTINUED] sodium chloride irrigation, [DISCONTINUED] tobramycin Assessment: 62 yo M with infected right knee joint For removal of prosthesis and implantation of antibiotic spacer. Allergic reaction to vancomycin.    Goal of Therapy:  Eradication of infection Clinical improvement   Plan:  1.) ordered  first dose of Daptomycin 2.) awaiting ID consult to obtain approval to continue Daptomycin daily.  Loletta Specter 04/27/2012,9:06 PM

## 2012-04-27 NOTE — Anesthesia Postprocedure Evaluation (Signed)
Anesthesia Post Note  Patient: Chris Obrien  Procedure(s) Performed: Procedure(s) (LRB): EXCISIONAL TOTAL KNEE ARTHROPLASTY WITH ANTIBIOTIC SPACERS (Right)  Anesthesia type: General  Patient location: PACU  Post pain: Pain level controlled  Post assessment: Post-op Vital signs reviewed  Last Vitals: BP 115/75  Pulse 81  Temp(Src) 36.4 C (Oral)  Resp 16  Ht 6' 0.84" (1.85 m)  Wt 236 lb 15.9 oz (107.5 kg)  BMI 31.41 kg/m2  SpO2 93%  Post vital signs: Reviewed  Level of consciousness: sedated  Complications: No apparent anesthesia complications

## 2012-04-27 NOTE — Transfer of Care (Signed)
Immediate Anesthesia Transfer of Care Note  Patient: Chris Obrien  Procedure(s) Performed: Procedure(s) with comments: EXCISIONAL TOTAL KNEE ARTHROPLASTY WITH ANTIBIOTIC SPACERS (Right) - SAME DAY LABS  Patient Location: PACU  Anesthesia Type:General  Level of Consciousness: awake, alert , oriented and patient cooperative  Airway & Oxygen Therapy: Patient Spontanous Breathing and Patient connected to face mask oxygen  Post-op Assessment: Report given to PACU RN, Post -op Vital signs reviewed and stable and Patient moving all extremities X 4  Post vital signs: stable  Complications: No apparent anesthesia complications

## 2012-04-27 NOTE — Care Management Note (Signed)
This is an active pt with [facility].  Please write resumption of care orders for this patient at discharge. Thank You   Venia Minks  [phone]

## 2012-04-27 NOTE — Progress Notes (Signed)
PICC flushed w 20cc NS. No resistance met. Unable to draw blood. Another 10cc ns attempted w/o resistance. Again no blood draw

## 2012-04-27 NOTE — Interval H&P Note (Signed)
History and Physical Interval Note:  04/27/2012 12:38 PM  Chris Obrien  has presented today for surgery, with the diagnosis of INFECTED RIGHT TOTAL KNEE  The various methods of treatment have been discussed with the patient and family. After consideration of risks, benefits and other options for treatment, the patient has consented to  Procedure(s) with comments: EXCISIONAL TOTAL KNEE ARTHROPLASTY WITH ANTIBIOTIC SPACERS (Right) - SAME DAY LABS as a surgical intervention .  The patient's history has been reviewed, patient examined, no change in status, stable for surgery.  I have reviewed the patient's chart and labs.  Questions were answered to the patient's satisfaction.     [name]

## 2012-04-28 DIAGNOSIS — R21 Rash and other nonspecific skin eruption: Secondary | ICD-10-CM

## 2012-04-28 DIAGNOSIS — M7040 Prepatellar bursitis, unspecified knee: Secondary | ICD-10-CM

## 2012-04-28 DIAGNOSIS — D649 Anemia, unspecified: Secondary | ICD-10-CM | POA: Diagnosis present

## 2012-04-28 LAB — CBC
HCT: 31.2 % — ABNORMAL LOW (ref 39.0–52.0)
Hemoglobin: 10.6 g/dL — ABNORMAL LOW (ref 13.0–17.0)
MCH: 28.9 pg (ref 26.0–34.0)
MCHC: 34 g/dL (ref 30.0–36.0)
MCV: 85 fL (ref 78.0–100.0)
Platelets: 291 10*3/uL (ref 150–400)
RBC: 3.67 MIL/uL — ABNORMAL LOW (ref 4.22–5.81)
RDW: 14.3 % (ref 11.5–15.5)
WBC: 14.4 10*3/uL — ABNORMAL HIGH (ref 4.0–10.5)

## 2012-04-28 LAB — BASIC METABOLIC PANEL
BUN: 16 mg/dL (ref 6–23)
CO2: 26 mEq/L (ref 19–32)
Calcium: 7.8 mg/dL — ABNORMAL LOW (ref 8.4–10.5)
Chloride: 106 mEq/L (ref 96–112)
Creatinine, Ser: 1.06 mg/dL (ref 0.50–1.35)
GFR calc Af Amer: 84 mL/min — ABNORMAL LOW (ref >90)
GFR calc non Af Amer: 73 mL/min — ABNORMAL LOW (ref >90)
Glucose, Bld: 105 mg/dL — ABNORMAL HIGH (ref 70–99)
Potassium: 4.5 mEq/L (ref 3.5–5.1)
Sodium: 140 mEq/L (ref 135–145)

## 2012-04-28 MED ORDER — ALTEPLASE 100 MG IV SOLR
2.0000 mg | Freq: Once | INTRAVENOUS | Status: AC
  Administered 2012-04-28: 2 mg
  Filled 2012-04-28: qty 2

## 2012-04-28 MED ORDER — DAPTOMYCIN 500 MG IV SOLR
6.0000 mg/kg | INTRAVENOUS | Status: AC
  Administered 2012-04-28: 645 mg via INTRAVENOUS
  Filled 2012-04-28: qty 12.9

## 2012-04-28 NOTE — Progress Notes (Signed)
Physical Therapy Treatment Patient Details Name: Chris Obrien MRN: [id number] DOB: 09/21/1950 Today's Date: 04/28/2012 Time: 4098-1191 PT Time Calculation (min): 21 min  PT Assessment / Plan / Recommendation Comments on Treatment Session  Pt progressing well, however with antalgic gait pattern and some difficulty with TDWB    Follow Up Recommendations  No PT follow up     Does the patient have the potential to tolerate intense rehabilitation     Barriers to Discharge        Equipment Recommendations  None recommended by PT    Recommendations for Other Services    Frequency 7X/week   Plan Discharge plan remains appropriate    Precautions / Restrictions Precautions Precautions: Fall;Knee Precaution Comments: no ROM to knee Required Braces or Orthoses: Knee Immobilizer - Right Knee Immobilizer - Right: On when out of bed or walking Restrictions Weight Bearing Restrictions: Yes RLE Weight Bearing: Touchdown weight bearing   Pertinent Vitals/Pain 4/10 pain, cryo cuff applied    Mobility  Bed Mobility Bed Mobility: Supine to Sit;Sit to Supine Supine to Sit: 4: Min guard;4: Min assist;HOB flat Sit to Supine: 4: Min assist;HOB flat Details for Bed Mobility Assistance: Some assist for RLE into and out of bed, however pt still able to self assist with UEs to get RLE out of bed.  Transfers Transfers: Sit to Stand;Stand to Sit Sit to Stand: 4: Min guard;From elevated surface;With upper extremity assist;From bed Stand to Sit: 4: Min guard;With upper extremity assist;To bed Details for Transfer Assistance: Max cues for hand placement and safety when sitting/standing.  Ambulation/Gait Ambulation/Gait Assistance: 4: Min assist Ambulation Distance (Feet): 100 Feet Assistive device: Rolling walker Ambulation/Gait Assistance Details: Max cues for decreased gait speed, maintaining TDWB, upright posture and increasing UE WB to decrease antalgic gait pattern.  Gait Pattern: Step-to  pattern;Decreased stride length;Antalgic;Trunk flexed Gait velocity: decreased    Exercises     PT Diagnosis:    PT Problem List:   PT Treatment Interventions:     PT Goals Acute Rehab PT Goals PT Goal Formulation: With patient Time For Goal Achievement: 05/01/12 Potential to Achieve Goals: Good Pt will go Supine/Side to Sit: with supervision PT Goal: Supine/Side to Sit - Progress: Progressing toward goal Pt will go Sit to Supine/Side: with supervision PT Goal: Sit to Supine/Side - Progress: Progressing toward goal Pt will go Sit to Stand: with supervision PT Goal: Sit to Stand - Progress: Progressing toward goal Pt will go Stand to Sit: with supervision PT Goal: Stand to Sit - Progress: Progressing toward goal Pt will Ambulate: 51 - 150 feet;with supervision;with least restrictive assistive device PT Goal: Ambulate - Progress: Progressing toward goal  Visit Information  Last PT Received On: 04/28/12 Assistance Needed: +1    Subjective Data  Subjective: I'll do better tomorrow.  Patient Stated Goal: to return home.    Cognition  Cognition Arousal/Alertness: Awake/alert Behavior During Therapy: WFL for tasks assessed/performed Overall Cognitive Status: Within Functional Limits for tasks assessed    Balance     End of Session PT - End of Session Equipment Utilized During Treatment: Right knee immobilizer Activity Tolerance: Patient tolerated treatment well Patient left: in bed;with call bell/phone within reach Nurse Communication: Mobility status   GP     Vista Deck 04/28/2012, 4:12 PM

## 2012-04-28 NOTE — Evaluation (Signed)
Physical Therapy Evaluation Patient Details Name: Chris Obrien MRN: [id number] DOB: Sep 07, 1950 Today's Date: 04/28/2012 Time: 4696-2952 PT Time Calculation (min): 15 min  PT Assessment / Plan / Recommendation Clinical Impression  Pt presents with infection of R knee s/p hardware removal and placement of anti biotic spacers.  Tolerated OOB and ambulation into hallway with RW at min assist and cues for maintaining TDWB throughout.  Pt will benefit from skilled PT in acute venue to address deficits.  PT recommends no follow up at this time until pt able to increase WB and knee ROM for strengthening exercises.      PT Assessment  Patient needs continued PT services    Follow Up Recommendations  No PT follow up    Does the patient have the potential to tolerate intense rehabilitation      Barriers to Discharge None      Equipment Recommendations  None recommended by PT    Recommendations for Other Services OT consult   Frequency 7X/week    Precautions / Restrictions Precautions Precautions: Fall;Knee Precaution Comments: no ROM to knee Required Braces or Orthoses: Knee Immobilizer - Right Knee Immobilizer - Right: On when out of bed or walking Restrictions Weight Bearing Restrictions: Yes RLE Weight Bearing: Touchdown weight bearing   Pertinent Vitals/Pain 5/10 pain, ice packs applied, premedicated.       Mobility  Bed Mobility Bed Mobility: Supine to Sit Supine to Sit: 4: Min guard;HOB elevated Details for Bed Mobility Assistance: min/guard to intiiate movement of RLE, however pt able to self assist with UEs to get RLE out of bed with min cues for technique.  Transfers Transfers: Sit to Stand;Stand to Sit Sit to Stand: 4: Min assist;With upper extremity assist;From elevated surface;From bed Stand to Sit: 4: Min assist;With upper extremity assist;With armrests;To chair/3-in-1 Details for Transfer Assistance: Assist to rise, steady and ensure controlled descent and also  to ensure RLE was extended in front of him when sitting.  Cues for hand placement and LE managemetn.  Ambulation/Gait Ambulation/Gait Assistance: 4: Min assist Ambulation Distance (Feet): 85 Feet Assistive device: Rolling walker Ambulation/Gait Assistance Details: Cues for sequencing/technique with RW and to maintain TDWB status during ambulation.  Also cues for upright posture and continued breathing.  Gait Pattern: Step-to pattern;Decreased stride length;Antalgic;Trunk flexed Gait velocity: decreased Stairs: No Wheelchair Mobility Wheelchair Mobility: No    Exercises     PT Diagnosis: Difficulty walking;Acute pain;Generalized weakness  PT Problem List: Decreased strength;Decreased activity tolerance;Decreased range of motion;Decreased balance;Decreased mobility;Decreased knowledge of use of DME;Pain PT Treatment Interventions: DME instruction;Gait training;Functional mobility training;Therapeutic activities;Therapeutic exercise;Balance training;Patient/family education   PT Goals Acute Rehab PT Goals PT Goal Formulation: With patient Time For Goal Achievement: 05/01/12 Potential to Achieve Goals: Good Pt will go Supine/Side to Sit: with supervision PT Goal: Supine/Side to Sit - Progress: Goal set today Pt will go Sit to Supine/Side: with supervision PT Goal: Sit to Supine/Side - Progress: Goal set today Pt will go Sit to Stand: with supervision PT Goal: Sit to Stand - Progress: Goal set today Pt will go Stand to Sit: with supervision PT Goal: Stand to Sit - Progress: Goal set today Pt will Ambulate: 51 - 150 feet;with supervision;with least restrictive assistive device PT Goal: Ambulate - Progress: Goal set today  Visit Information  Last PT Received On: 04/28/12 Assistance Needed: +1    Subjective Data  Subjective: This is just really inconvenient.  Patient Stated Goal: to return home.    Prior Functioning  Home  Living Lives With: Spouse Available Help at Discharge:  Family;Available 24 hours/day Type of Home: House Home Access: Level entry Home Layout: Multi-level;Able to live on main level with bedroom/bathroom Bathroom Shower/Tub: Walk-in shower;Door [employer] Toilet: Handicapped height Home Adaptive Equipment: Engineer, drilling - rolling;Crutches;Straight cane Prior Function Level of Independence: Independent Able to Take Stairs?: Yes Driving: Yes Vocation: Retired Musician: No difficulties    Copywriter, advertising Arousal/Alertness: Awake/alert Behavior During Therapy: WFL for tasks assessed/performed Overall Cognitive Status: Within Functional Limits for tasks assessed    Extremity/Trunk Assessment Right Lower Extremity Assessment RLE ROM/Strength/Tone: Unable to fully assess;Due to precautions;Due to pain RLE Sensation: WFL - Light Touch Left Lower Extremity Assessment LLE ROM/Strength/Tone: WFL for tasks assessed LLE Sensation: WFL - Light Touch Trunk Assessment Trunk Assessment: Normal   Balance    End of Session PT - End of Session Equipment Utilized During Treatment: Gait belt;Right knee immobilizer Activity Tolerance: Patient tolerated treatment well Patient left: in chair;with call bell/phone within reach Nurse Communication: Mobility status  GP     Vista Deck 04/28/2012, 9:23 AM

## 2012-04-28 NOTE — Consult Note (Signed)
[facility]    Date of Admission:  04/27/2012   Total days of antibiotics 33        Day 2 daptomycin              Reason for Consult: Right prepatellar bursitis and possible infected total knee arthroplasty   Referring Physician: Dr. Lajoyce Corners  Principal Problem:   S/P Right knee removal of prosthesis with antibiotic spacer Active Problems:   S/P right TKA   Right reactive prepatellar bursitis   Rash and nonspecific skin eruption   Normocytic anemia   . celecoxib  200 mg Oral Q12H  . DAPTOmycin (CUBICIN)  IV  6 mg/kg Intravenous Q24H  . docusate sodium  100 mg Oral BID  . ferrous sulfate  325 mg Oral TID PC  . HYDROcodone-acetaminophen  1-2 tablet Oral Q4H  . polyethylene glycol  17 g Oral BID  . rivaroxaban  10 mg Oral Q24H  . tamsulosin  0.4 mg Oral Daily    Recommendations: 1. Continue daptomycin pending final culture results   Assessment: I will need to review Dr. Nilsa Nutting operative findings and final culture results before making final recommendations about antibiotics. If the prosthetic joint was infected it was almost certainly due to the same MSSA that grew from prepatellar bursal cultures. If so then IV cefazolin would be the drug of choice. He certainly appears that he has developed an allergic reaction to the vancomycin that is now resolving off of it. His nurses have been unable to draw blood from the right arm PICC in his infusion times had been progressively longer despite having TPA therapy recently. I will repeat the TPA therapy now. He may need a PICC replacement if it is not functioning well.   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 He had an uneventful recovery until he developed a bad case of poison ivy after clearing some brush in March. The poison ivy rash began to resolve but then he developed acute pain, swelling and redness over his right knee. It  did not respond well to oral antibiotic therapy with Septra and Augmentin. Bursal fluid culture on March 24 grew methicillin sensitive staph aureus. He was admitted to the hospital on March 27 for incision and drainage of the bursa. Gram stain and cultures of that specimen were negative. He was discharged home on IV vancomycin and oral rifampin. Says that his knee improved but he had low-grade fevers about every day up to 101. Six days ago he began to notice progressive erythematous rash that worse whenever he confused vancomycin. He also began noticing higher fevers up to 104. His vancomycin was stopped because of concerns about red man reaction. He was switched to ciprofloxacin and the rash and fevers began to resolve but because of concerns about persistent infection of his artificial joint he was readmitted yesterday and underwent resection arthroplasty and placement of a spacer. Gram stain of that operative specimen does not reveal any organisms.   Review of Systems: Pertinent items are noted in HPI.  Past Medical History  Diagnosis Date  . Benign neoplasm of colon   . Insomnia, unspecified   . Oligospermia   . OSA on CPAP   . Swelling of joint of right knee   . Right knee skin infection   . BPH (benign prostatic hypertrophy)     hx small stream  .  Arthritis   . Acute reactive arthritis per pcp note from 03-30-2012    right pre-patella---  secondary contact dermitis with poison ivy exposure 03-26-2012    History  Substance Use Topics  . Smoking status: Former Smoker    Types: Cigars    Quit date: 01/08/2008  . Smokeless tobacco: Never Used  . Alcohol Use: Yes     Comment: occasionally    Family History  Problem Relation Age of Onset  . Hyperlipidemia    . Alzheimer's disease Mother   . Arthritis Father   . Diabetes Brother    Allergies  Allergen Reactions  . Vancomycin Rash    Severe redness all over body with swollen ankles and hands  . Oxycodone Rash     OBJECTIVE: Blood pressure 120/73, pulse 86, temperature 98.5 F (36.9 C), temperature source Oral, resp. rate 18, height 6' 0.84" (1.85 m), weight 107.5 kg (236 lb 15.9 oz), SpO2 96.00%. General: He is alert and in no distress Skin: He has a peeling,. His right arm PICC site appears normal. erythematous rash diffusely Lungs: Clear Cor: Regular S1 and S2 no murmurs Abdomen: Soft and nontender His right leg is bandaged  Microbiology: Recent Results (from the past 240 hour(s))  CULTURE, BLOOD (ROUTINE X 2)     Status: None   Collection Time    04/24/12  9:15 AM      Result Value Range Status   Specimen Description BLOOD RIGHT HAND   Final   Special Requests BOTTLES DRAWN AEROBIC AND ANAEROBIC   Final   Culture  Setup Time 04/24/2012 14:04   Final   Culture     Final   Value:        BLOOD CULTURE RECEIVED NO GROWTH TO DATE CULTURE WILL BE HELD FOR 5 DAYS BEFORE ISSUING A FINAL NEGATIVE REPORT   Report Status PENDING   Incomplete  CULTURE, BLOOD (ROUTINE X 2)     Status: None   Collection Time    04/24/12  9:15 AM      Result Value Range Status   Specimen Description BLOOD LEFT HAND   Final   Special Requests BOTTLES DRAWN AEROBIC AND ANAEROBIC   Final   Culture  Setup Time 04/24/2012 14:04   Final   Culture     Final   Value:        BLOOD CULTURE RECEIVED NO GROWTH TO DATE CULTURE WILL BE HELD FOR 5 DAYS BEFORE ISSUING A FINAL NEGATIVE REPORT   Report Status PENDING   Incomplete  URINE CULTURE     Status: None   Collection Time    04/24/12  9:30 AM      Result Value Range Status   Specimen Description URINE, CLEAN CATCH   Final   Special Requests NONE   Final   Culture  Setup Time 04/24/2012 14:20   Final   Colony Count NO GROWTH   Final   Culture NO GROWTH   Final   Report Status 04/25/2012 FINAL   Final  SURGICAL PCR SCREEN     Status: None   Collection Time    04/27/12 11:57 AM      Result Value Range Status   MRSA, PCR NEGATIVE  NEGATIVE Final    Staphylococcus aureus NEGATIVE  NEGATIVE Final   Comment:            The Xpert SA Assay (FDA     approved for NASAL specimens     in patients over  28 years of age),     is one component of     a comprehensive surveillance     program.  Test performance has     been validated by [employer] for patients greater     than or equal to 39 year old.     It is not intended     to diagnose infection nor to     guide or monitor treatment.  BODY FLUID CULTURE     Status: None   Collection Time    04/27/12  4:24 PM      Result Value Range Status   Specimen Description KNEE RIGHT   Final   Special Requests NONE   Final   Gram Stain     Final   Value: FEW WBC PRESENT, PREDOMINANTLY PMN     NO ORGANISMS SEEN     Gram Stain Report Called to,Read Back By and Verified With: Gram Stain Report Called to,Read Back By and Verified With: Harlow Asa 1807 04/27/12 BY LOVE T Performed by [facility]   Culture NO GROWTH   Final   Report Status PENDING   Incomplete  GRAM STAIN     Status: None   Collection Time    04/27/12  4:24 PM      Result Value Range Status   Specimen Description KNEE RIGHT   Final   Special Requests NONE   Final   Gram Stain     Final   Value: FEW WBC SEEN     NO ORGANISMS SEEN     Gram Stain Report Called to,Read Back By and Verified With: ALLRED,W. AT 1807 ON [id number] BY LOVE,T.   Report Status 04/27/2012 FINAL   Final    Cliffton Asters, MD [facility] [phone] pager   [phone] cell 04/28/2012, 2:21 PM

## 2012-04-28 NOTE — Progress Notes (Signed)
Received order for rw. This has been delivered to hospital room.

## 2012-04-29 LAB — CBC
HCT: 29.7 % — ABNORMAL LOW (ref 39.0–52.0)
Hemoglobin: 9.7 g/dL — ABNORMAL LOW (ref 13.0–17.0)
MCH: 27.5 pg (ref 26.0–34.0)
MCHC: 32.7 g/dL (ref 30.0–36.0)
MCV: 84.1 fL (ref 78.0–100.0)
Platelets: 298 10*3/uL (ref 150–400)
RBC: 3.53 MIL/uL — ABNORMAL LOW (ref 4.22–5.81)
RDW: 14.6 % (ref 11.5–15.5)
WBC: 11 10*3/uL — ABNORMAL HIGH (ref 4.0–10.5)

## 2012-04-29 LAB — BASIC METABOLIC PANEL
BUN: 13 mg/dL (ref 6–23)
CO2: 26 mEq/L (ref 19–32)
Calcium: 7.9 mg/dL — ABNORMAL LOW (ref 8.4–10.5)
Chloride: 106 mEq/L (ref 96–112)
Creatinine, Ser: 0.94 mg/dL (ref 0.50–1.35)
GFR calc Af Amer: >90 mL/min (ref >90)
GFR calc non Af Amer: 87 mL/min — ABNORMAL LOW (ref >90)
Glucose, Bld: 97 mg/dL (ref 70–99)
Potassium: 4.1 mEq/L (ref 3.5–5.1)
Sodium: 139 mEq/L (ref 135–145)

## 2012-04-29 MED ORDER — TIZANIDINE HCL 4 MG PO CAPS: 4.0000 mg | ORAL_CAPSULE | Freq: Three times a day (TID) | ORAL | Status: DC

## 2012-04-29 MED ORDER — DSS 100 MG PO CAPS: 100.0000 mg | ORAL_CAPSULE | Freq: Two times a day (BID) | ORAL | Status: DC

## 2012-04-29 MED ORDER — CEFAZOLIN SODIUM-DEXTROSE 2-3 GM-% IV SOLR: 2.0000 g | Freq: Three times a day (TID) | INTRAVENOUS | Status: DC

## 2012-04-29 MED ORDER — HEPARIN SOD (PORK) LOCK FLUSH 100 UNIT/ML IV SOLN
250.0000 [IU] | INTRAVENOUS | Status: AC | PRN
  Administered 2012-04-29: 500 [IU]

## 2012-04-29 MED ORDER — FERROUS SULFATE 325 (65 FE) MG PO TABS: 325.0000 mg | ORAL_TABLET | Freq: Three times a day (TID) | ORAL | Status: DC

## 2012-04-29 MED ORDER — CEFAZOLIN SODIUM-DEXTROSE 2-3 GM-% IV SOLR
2.0000 g | Freq: Three times a day (TID) | INTRAVENOUS | Status: DC
  Administered 2012-04-29: 2 g via INTRAVENOUS
  Filled 2012-04-29 (×3): qty 50

## 2012-04-29 MED ORDER — ASPIRIN 325 MG PO TBEC: 325.0000 mg | DELAYED_RELEASE_TABLET | Freq: Two times a day (BID) | ORAL | Status: DC

## 2012-04-29 MED ORDER — POLYETHYLENE GLYCOL 3350 17 G PO PACK: 17.0000 g | PACK | Freq: Two times a day (BID) | ORAL | Status: DC

## 2012-04-29 MED ORDER — HYDROCODONE-ACETAMINOPHEN 7.5-325 MG PO TABS: 1.0000 | ORAL_TABLET | ORAL | Status: DC | PRN

## 2012-04-29 NOTE — Progress Notes (Signed)
Patient ID: Chris Obrien, male   DOB: December 16, 1950, 62 y.o.   MRN: [id number]         [facility]    Date of Admission:  04/27/2012   Total days of antibiotics 33         Principal Problem:   S/P Right knee removal of prosthesis with antibiotic spacer Active Problems:   S/P right TKA   Right reactive prepatellar bursitis   Rash and nonspecific skin eruption   Normocytic anemia   . celecoxib  200 mg Oral Q12H  . docusate sodium  100 mg Oral BID  . ferrous sulfate  325 mg Oral TID PC  . HYDROcodone-acetaminophen  1-2 tablet Oral Q4H  . polyethylene glycol  17 g Oral BID  . rivaroxaban  10 mg Oral Q24H  . tamsulosin  0.4 mg Oral Daily    Subjective: He is feeling much better with less right knee pain. His acute rash is resolving.  Objective: Temp:  [98.4 F (36.9 C)-98.5 F (36.9 C)] 98.4 F (36.9 C) (04/23 0457) Pulse Rate:  [77-87] 77 (04/23 0457) Resp:  [16-20] 16 (04/23 0457) BP: (120-159)/(73-82) 159/82 mmHg (04/23 0457) SpO2:  [93 %-98 %] 93 % (04/23 0457)  General: He is alert and appears comfortable Skin: There is diffuse erythema is decreasing slowly. He has some mild, diffuse desquamation Right knee: He has no drain in. He has an ACE wrap dressing on  Lab Results Lab Results  Component Value Date   WBC 11.0* 04/29/2012   HGB 9.7* 04/29/2012   HCT 29.7* 04/29/2012   MCV 84.1 04/29/2012   PLT 298 04/29/2012    Lab Results  Component Value Date   CREATININE 0.94 04/29/2012   BUN 13 04/29/2012   NA 139 04/29/2012   K 4.1 04/29/2012   CL 106 04/29/2012   CO2 26 04/29/2012    Lab Results  Component Value Date   ALT 51 04/25/2012   AST 24 04/25/2012   ALKPHOS 50 04/25/2012   BILITOT 0.2* 04/25/2012      Microbiology: Recent Results (from the past 240 hour(s))  CULTURE, BLOOD (ROUTINE X 2)     Status: None   Collection Time    04/24/12  9:15 AM      Result Value Range Status   Specimen Description BLOOD RIGHT HAND   Final   Special Requests BOTTLES DRAWN AEROBIC AND ANAEROBIC   Final   Culture  Setup Time 04/24/2012 14:04   Final   Culture     Final   Value:        BLOOD CULTURE RECEIVED NO GROWTH TO DATE CULTURE WILL BE HELD FOR 5 DAYS BEFORE ISSUING A FINAL NEGATIVE REPORT   Report Status PENDING   Incomplete  CULTURE, BLOOD (ROUTINE X 2)     Status: None   Collection Time    04/24/12  9:15 AM      Result Value Range Status   Specimen Description BLOOD LEFT HAND   Final   Special Requests BOTTLES DRAWN AEROBIC AND ANAEROBIC   Final   Culture  Setup Time 04/24/2012 14:04   Final   Culture     Final   Value:        BLOOD CULTURE RECEIVED NO GROWTH TO DATE CULTURE WILL BE HELD FOR 5 DAYS BEFORE ISSUING A FINAL NEGATIVE REPORT   Report Status PENDING   Incomplete  URINE CULTURE     Status: None  Collection Time    04/24/12  9:30 AM      Result Value Range Status   Specimen Description URINE, CLEAN CATCH   Final   Special Requests NONE   Final   Culture  Setup Time 04/24/2012 14:20   Final   Colony Count NO GROWTH   Final   Culture NO GROWTH   Final   Report Status 04/25/2012 FINAL   Final  SURGICAL PCR SCREEN     Status: None   Collection Time    04/27/12 11:57 AM      Result Value Range Status   MRSA, PCR NEGATIVE  NEGATIVE Final   Staphylococcus aureus NEGATIVE  NEGATIVE Final   Comment:            The Xpert SA Assay (FDA     approved for NASAL specimens     in patients over 10 years of age),     is one component of     a comprehensive surveillance     program.  Test performance has     been validated by [employer] for patients greater     than or equal to 71 year old.     It is not intended     to diagnose infection nor to     guide or monitor treatment.  BODY FLUID CULTURE     Status: None   Collection Time    04/27/12  4:24 PM      Result Value Range Status   Specimen Description KNEE RIGHT   Final   Special Requests NONE   Final   Gram Stain     Final   Value: FEW WBC  PRESENT, PREDOMINANTLY PMN     NO ORGANISMS SEEN     Gram Stain Report Called to,Read Back By and Verified With: Gram Stain Report Called to,Read Back By and Verified With: Harlow Asa 1807 04/27/12 BY LOVE T Performed by [facility]   Culture NO GROWTH   Final   Report Status PENDING   Incomplete  GRAM STAIN     Status: None   Collection Time    04/27/12  4:24 PM      Result Value Range Status   Specimen Description KNEE RIGHT   Final   Special Requests NONE   Final   Gram Stain     Final   Value: FEW WBC SEEN     NO ORGANISMS SEEN     Gram Stain Report Called to,Read Back By and Verified With: ALLRED,W. AT 1807 ON [id number] BY LOVE,T.   Report Status 04/27/2012 FINAL   Final   Assessment: He had methicillin sensitive staph aureus grown from right prepatellar bursal fluid obtained as an outpatient on March 24. He was on oral trimethoprim sulfamethoxazole and amoxicillin clavulanate at the time. Cultures from bursal fluid obtained 3 days later were negative. Operative cultures obtained 2 days ago during his excision arthroplasty are also negative but they are almost certainly falsely negative because he has been on antibiotics for over one month now. If his knee prosthesis was infected it was almost certainly with the same him MSSA. I will switch him to IV cefazolin and plan on at least 4 more weeks of therapy.  Plan: 1. Changed to cefazolin 2 g IV push Q8 hours 2. I will arrange to see him in clinic in one week  Cliffton Asters, MD [facility] [phone] pager  [phone] cell 04/29/2012, 9:22 AM

## 2012-04-29 NOTE — Brief Op Note (Signed)
04/27/2012  10:34 PM  PATIENT:  Jaclyn Prime Benninger  62 y.o. male  PRE-OPERATIVE DIAGNOSIS:  INFECTED RIGHT TOTAL KNEE  POST-OPERATIVE DIAGNOSIS:  INFECTED RIGHT TOTAL KNEE  PROCEDURE:  Procedure(s) with comments: EXCISIONAL TOTAL KNEE ARTHROPLASTY WITH ANTIBIOTIC SPACERS (Right) -  SURGEON:  Surgeon(s) and Role:    * [name], MD - Primary  PHYSICIAN ASSISTANT: Lanney Gins, PA-C  ANESTHESIA:   per anesthetic record  EBL:     BLOOD ADMINISTERED:none  DRAINS: (1 medium) Hemovact drain(s) in the right knee with  Suction Open   LOCAL MEDICATIONS USED:  NONE  SPECIMEN:  Source of Specimen:  right knee fluid, tissue  DISPOSITION OF SPECIMEN:  PATHOLOGY  COUNTS:  YES  TOURNIQUET:   Total Tourniquet Time Documented: Thigh (Right) - 116 minutes Total: Thigh (Right) - 116 minutes   DICTATION: .Other Dictation: Dictation Number [id number]  PLAN OF CARE: Admit to inpatient   PATIENT DISPOSITION:  PACU - hemodynamically stable.   Delay start of Pharmacological VTE agent (>24hrs) due to surgical blood loss or risk of bleeding: no

## 2012-04-29 NOTE — Evaluation (Signed)
Occupational Therapy Evaluation and Discharge Summary Patient Details Name: Chris Obrien MRN: [id number] DOB: 1950-12-28 Today's Date: 04/29/2012 Time: 4098-1191 OT Time Calculation (min): 16 min  OT Assessment / Plan / Recommendation Clinical Impression  Pt is a 62 yo male who underwent R removal of hardware from knee and antibiotic spacers placed due to infection who overall is doing well with adls and has assist at home if necessary.  Pt with no acute OT needs.    OT Assessment  Patient does not need any further OT services    Follow Up Recommendations  No OT follow up    Barriers to Discharge      Equipment Recommendations  None recommended by OT    Recommendations for Other Services    Frequency       Precautions / Restrictions Precautions Precautions: Fall;Knee Precaution Comments: no ROM to knee Required Braces or Orthoses: Knee Immobilizer - Right Knee Immobilizer - Right: On when out of bed or walking Restrictions Weight Bearing Restrictions: Yes RLE Weight Bearing: Touchdown weight bearing   Pertinent Vitals/Pain No complaints of pain.  Pt more annoyed with "clicking" noise in his knee.    ADL  Eating/Feeding: Performed;Independent Where Assessed - Eating/Feeding: Chair Grooming: Performed;Wash/dry hands;Modified independent Where Assessed - Grooming: Supported standing Upper Body Bathing: Performed;Set up Where Assessed - Upper Body Bathing: Unsupported sitting Lower Body Bathing: Simulated;Set up Where Assessed - Lower Body Bathing: Supported sit to stand Upper Body Dressing: Performed;Set up Where Assessed - Upper Body Dressing: Unsupported sitting Lower Body Dressing: Performed;Supervision/safety Where Assessed - Lower Body Dressing: Supported sit to stand Toilet Transfer: Research scientist (life sciences) Method: Stand pivot;Sit to Barista: Comfort height toilet;Grab bars Toileting - Clothing Manipulation and  Hygiene: Supervision/safety;Performed Where Assessed - Toileting Clothing Manipulation and Hygiene: Standing Tub/Shower Transfer: Performed;Min guard Tub/Shower Transfer Method: Ambulating;Other (comment) (focusing on TDWB) Equipment Used: Rolling walker;Knee Immobilizer Transfers/Ambulation Related to ADLs: Pt walked in room and in and out fo shower with cues to maintain TDWB ADL Comments: Pt does very well with all other adls due to going thru this previously    OT Diagnosis:    OT Problem List:   OT Treatment Interventions:     OT Goals    Visit Information  Last OT Received On: 04/29/12 Assistance Needed: +1    Subjective Data  Subjective: "I have done all this before.  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 Communication: No difficulties Dominant Hand: Right         Vision/Perception Vision - History Baseline Vision: No visual deficits Patient Visual Report: No change from baseline Vision - Assessment Vision Assessment: Vision not tested   Cognition  Cognition Arousal/Alertness: Awake/alert Behavior During Therapy: WFL for tasks assessed/performed Overall Cognitive Status: Within Functional Limits for tasks assessed    Extremity/Trunk Assessment Right Upper Extremity Assessment RUE ROM/Strength/Tone: Within functional levels RUE Sensation: WFL - Light Touch RUE  Coordination: WFL - gross/fine motor Left Upper Extremity Assessment LUE ROM/Strength/Tone: Within functional  levels LUE Sensation: WFL - Light Touch LUE Coordination: WFL - gross/fine motor Trunk Assessment Trunk Assessment: Normal     Mobility Bed Mobility Bed Mobility: Supine to Sit Supine to Sit: 5: Supervision Details for Bed Mobility Assistance: Pt able to completely self assist RLE out of bed today. Transfers Transfers: Sit to Stand;Stand to Sit Sit to Stand: 5: Supervision;4: Min guard;With upper extremity assist;From elevated surface;From bed Stand to Sit: 5: Supervision;With upper extremity assist;With armrests;To chair/3-in-1 Details for Transfer Assistance: Performed x 2 in order to use toilet in restroom with cues for hand placement and gettting all the way back to seated surface when sitting.      Exercise     Balance     End of Session OT - End of Session Equipment Utilized During Treatment: Right knee immobilizer Activity Tolerance: Patient tolerated treatment well Patient left: in chair;with call bell/phone within reach Nurse Communication: Mobility status  GO     Hope Budds 04/29/2012, 10:15 AM [phone]

## 2012-04-29 NOTE — Care Management Note (Signed)
    Page 1 of 2   04/29/2012     5:40:41 PM   CARE MANAGEMENT NOTE 04/29/2012  Patient:  Chris Obrien, Chris Obrien   Account Number:  [id number]  Date Initiated:  04/28/2012  Documentation initiated by:  Colleen Can  Subjective/Objective Assessment:   DX INFECTED RT TOTAL KNEE     Action/Plan:   CM SPOKE WITH PATIENT; PLANS ARE FOR PATIENT TO RETURN TO HIS HOME WHERE SPOUSE WILL BE CAREGIVER. STATES HE ALREADY HAS DME. [facility] IS [facility]QUESTING [facility].   Anticipated DC Date:  04/29/2012   Anticipated DC Plan:  HOME W HOME HEALTH SERVICES      DC Planning Services  CM consult      [facility] Choice  HOME HEALTH   Choice offered to / List presented to:  C-1 Patient        HH arranged  HH-2 PT  HH-1 RN  IV Antibiotics      HH agency  [facility]  [facility]   Status of service:  Completed, signed off Medicare Important Message given?   (If response is "NO", the following Medicare IM given date fields will be blank) Date Medicare IM given:   Date Additional Medicare IM given:    Discharge Disposition:  HOME W HOME HEALTH SERVICES  Per UR Regulation:  Reviewed for med. necessity/level of care/duration of stay  If discussed at Long Length of Stay Meetings, dates discussed:    Comments:  04/29/2012 Colleen Can BSN RN CCM [phone] Pt discharged today with [facility] services in place for Home IV antibiotics; [facility] will provide HHrn and HHpt; [facility] will provide infusion therapy. Services for home infusion will start today.

## 2012-04-29 NOTE — Progress Notes (Signed)
Physical Therapy Treatment Patient Details Name: Chris Obrien MRN: [id number] DOB: 1950-07-22 Today's Date: 04/29/2012 Time: 4098-1191 PT Time Calculation (min): 17 min  PT Assessment / Plan / Recommendation Comments on Treatment Session  Pt continues to progress well and will be ready for D/C later today.     Follow Up Recommendations  No PT follow up     Does the patient have the potential to tolerate intense rehabilitation     Barriers to Discharge        Equipment Recommendations  None recommended by PT    Recommendations for Other Services    Frequency 7X/week   Plan Discharge plan remains appropriate    Precautions / Restrictions Precautions Precautions: Fall;Knee Precaution Comments: no ROM to knee Required Braces or Orthoses: Knee Immobilizer - Right Knee Immobilizer - Right: On when out of bed or walking Restrictions Weight Bearing Restrictions: Yes RLE Weight Bearing: Touchdown weight bearing   Pertinent Vitals/Pain 4/10 pain    Mobility  Bed Mobility Bed Mobility: Supine to Sit Supine to Sit: 5: Supervision Details for Bed Mobility Assistance: Pt able to completely self assist RLE out of bed today. Transfers Transfers: Sit to Stand;Stand to Sit Sit to Stand: 5: Supervision;4: Min guard;With upper extremity assist;From elevated surface;From bed Stand to Sit: 5: Supervision;With upper extremity assist;With armrests;To chair/3-in-1 Details for Transfer Assistance: Performed x 2 in order to use toilet in restroom with cues for hand placement and gettting all the way back to seated surface when sitting.  Ambulation/Gait Ambulation/Gait Assistance: 4: Min guard Ambulation Distance (Feet): 160 Feet Assistive device: Rolling walker Ambulation/Gait Assistance Details: Continue to provide max cues for increasing UE WB to decrease antalgic gait and also to maintain TDWB.  Per pt, states he asked MD about getting w/c for home use to better conserve energy.  Gait  Pattern: Step-to pattern;Decreased stride length;Antalgic;Trunk flexed Gait velocity: decreased Stairs: No    Exercises     PT Diagnosis:    PT Problem List:   PT Treatment Interventions:     PT Goals Acute Rehab PT Goals PT Goal Formulation: With patient Time For Goal Achievement: 05/01/12 Potential to Achieve Goals: Good Pt will go Supine/Side to Sit: with supervision PT Goal: Supine/Side to Sit - Progress: Met Pt will go Sit to Stand: with supervision PT Goal: Sit to Stand - Progress: Partly met Pt will go Stand to Sit: with supervision PT Goal: Stand to Sit - Progress: Met Pt will Ambulate: 51 - 150 feet;with supervision;with least restrictive assistive device PT Goal: Ambulate - Progress: Partly met  Visit Information  Last PT Received On: 04/29/12 Assistance Needed: +1    Subjective Data  Subjective: I'm ready to go home.  Patient Stated Goal: to return home.    Cognition  Cognition Arousal/Alertness: Awake/alert Behavior During Therapy: WFL for tasks assessed/performed Overall Cognitive Status: Within Functional Limits for tasks assessed    Balance     End of Session PT - End of Session Equipment Utilized During Treatment: Right knee immobilizer Activity Tolerance: Patient tolerated treatment well Patient left: in chair;with call bell/phone within reach Nurse Communication: Mobility status   GP     Vista Deck 04/29/2012, 9:06 AM

## 2012-04-30 ENCOUNTER — Encounter (HOSPITAL_COMMUNITY): Payer: Self-pay | Admitting: Orthopedic Surgery

## 2012-04-30 LAB — CULTURE, BLOOD (ROUTINE X 2)
Culture: NO GROWTH
Culture: NO GROWTH

## 2012-04-30 NOTE — Op Note (Signed)
NAMEMALCOM, SELMER NO.:  [id number]  MEDICAL RECORD NO.:  [id number]  LOCATION:  1621                         FACILITY:  [facility]  PHYSICIAN:  Madlyn Frankel. Charlann Boxer, M.D.  DATE OF BIRTH:  August 11, 1950  DATE OF PROCEDURE:  04/27/2012 DATE OF DISCHARGE:  04/29/2012                              OPERATIVE REPORT   PREOPERATIVE DIAGNOSIS:  Infected right total knee replacement.  POSTOPERATIVE DIAGNOSIS:  Infected right total knee replacement.  PROCEDURE:  Resection of right knee arthroplasty with placement of antibiotic spacer.  SURGEON:  Madlyn Frankel. Charlann Boxer, M.D.  ASSISTANT:  Lanney Gins, PA-C.  Note that Mr. [name] was present for the entirety of the case from preoperative positioning, perioperative management operative extremity, general facilitation of the case.  ANESTHESIA:  Per anesthetic record.  COMPLICATIONS:  None apparent.  DRAINS:  One medium Hemovac.  TOURNIQUET TIME:  115 minutes at 250 mmHg.  COMPLICATIONS:  None.  FINDINGS:  Intraoperative evaluation of synovial fluid revealed white cell count of 4600 white cells.  INDICATION FOR PROCEDURE:  Mr. Revak is a very pleasant patient of mine with previous right total knee replacement within the last 3 months.  He had done exceptionally well with his overall recovery so much so that he was helping his son towards some weeds around the shed and unfortunately contracted a significant flareup of poison ivy.  He had noticed significant reaction around his knee, was seen and evaluated initially by primary care physician, placed on oral antibiotics.  When he was seen and evaluated myself, he was noted to have significant erythema around his right knee including the prepatellar area with swelling after continued attempts at some oral antibiotics that caused some erythema down.  We took him to the operating room and evacuated this prepatellar area of active bleeding.  Purulent cultures were taken at that  time and noted to be methicillin-sensitive Staph aureus.  However, he was placed on daptomycin postprocedurally and had significant reaction to this including a red reaction throughout his body with peeling response and probable low-grade fevers.  He had persistent recurring problems and followup in the office.  We had a lengthy discussion about options.  Ultimately, it was decided that we would resect his knee, treat it with antibiotics for 6 weeks and place a new knee following this.  This was a very tough decision given the active lifestyle he lives both in work and in life.  The risks of recurrent infection, the reasoning behind the procedure were discussed. Risk of infection, DVT were discussed and reviewed.  Consent was obtained for this portion of the procedure.  PROCEDURE IN DETAIL:  The patient was brought to operative theater. Once adequate anesthesia, preoperative antibiotics, which included Ancef and daptomycin administered, the patient was positioned supine.  His right leg was placed in the tourniquet, and his right lower extremity was then prepped and draped in the sterile fashion including the foot with a DeMayo leg holder.  Time-out was performed identifying the patient, planned procedure, and extremity.  The leg was exsanguinated. Tourniquet was elevated to 250 mmHg.  The patient's old incision was incised and excised soft tissue planes created an arthrotomy was made.  At this point, I felt synovial fluid that was a bit blood tinged, but did not have obvious purulence.  I had discussed with Mr. Rodarte and his wife that given the fact that once he had stopped his vancomycin, he started to feel pretty good.  His knee was feeling good, and if there was an off chance that his knee looked pristine when I got into it that I would consider just polyethylene exchange and debridement and I am trying to salvage this joint.  We clinically decide at this point to take an aspiration  of this fluid and send it to Pathology for stat read of white cell count.  The white cell count came back after approximately 20 minutes of awaiting with 4600 white cells.  Given the threshold of concern for infection he would need arthroplasty.  With 1500 white cells, I made the clinical determination to go and resect his knee to try to salvage this through a 2-stage technique.  At this point, attention was directed to tibial component.  First using a thin ACL saw, I resected the tibial component first, subluxated the knee anteriorly.  I was able to get the tibial component without significant bone loss.  I did use an osteotome to remove some of the cement that was present and this caused some problems with some bone loss in the central portion of the metaphysis that typically was present.  The femoral component was then removed using thin ACL as well as an osteotome.  The femoral component was removed with minimal to no bone loss.  Follow removing cement from the femoral and tibial sides, the patellar component was removed as well.  I subsequently irrigated the knee out with a total of 6 L of normal saline solution pulse lavaged.  On the back table, we manufactured antibiotic beads using the Biomet molds combined with tobramycin based on his reaction to vancomycin.  Once the cement molds were made, they were cemented into place and held in the knee in an extension.  The medium Hemovac drain was placed deep.  The knee was re-irrigated again at this point as the cement fully cured.  The extensor mechanism was then reapproximated over the Hemovac drain using #1 Vicryl.  The remaining wound was closed with 2-0 Vicryl and staples on the skin.  The skin was cleaned, dried, and dressed sterilely using Xeroform and a bulky sterile wrap with a plan to place an Aquacel dressing later.  The patient was then brought to the recovery room in stable condition, tolerated the procedure well.  We  will consult Infectious Disease to plan for 6 weeks of IV antibiotics.  He will be started on daptomycin based on his reaction to vancomycin.  We will plan to try to proceed with the reimplantation of total knee in 8 weeks.    Madlyn Frankel Charlann Boxer, M.D.    MDO/MEDQ  D:  04/29/2012  T:  04/30/2012  Job:  [id number]

## 2012-05-01 LAB — BODY FLUID CULTURE: Culture: NO GROWTH

## 2012-05-03 NOTE — Progress Notes (Addendum)
   Subjective: 2 Days Post-Op Procedure(s) (LRB): EXCISIONAL TOTAL KNEE ARTHROPLASTY WITH ANTIBIOTIC SPACERS (Right)   Patient reports pain as mild, pain well controlled. No events throughout the night. ID recs are in. He feels ready to be discharged home.  Objective:   VITALS:   Filed Vitals:   04/29/12 0457  BP: 159/82  Pulse: 77  Temp: 98.4 F (36.9 C)  Resp: 16    Neurovascular intact Dorsiflexion/Plantar flexion intact Incision: dressing C/D/I  LABS Recent Labs   04/28/12 0451  04/29/12 0550   HGB  10.6 9.7*   HCT  31.2  29.7*   WBC  14.4  11.0*   PLT  290 298     Recent Labs   04/28/12 0451  04/29/12 0550   NA  140  139  K  4.5 4.1   BUN  16 13   CREATININE  1.06  0.94   GLUCOSE  105*  97      Assessment/Plan: 2 Days Post-Op Procedure(s) (LRB): EXCISIONAL TOTAL KNEE ARTHROPLASTY WITH ANTIBIOTIC SPACERS (Right)   Up with therapy Discharge home with home health RN Follow up in 2 weeks at [facility]. Follow up with OLIN,Alfred Eckley D in 2 weeks.  Contact information:  [facility] [address], [address] [place] [address] [phone]      Expected ABLA  Treated with iron and will observe  Obese (BMI 30-39.9) Estimated body mass index is 31.41 kg/(m^2) as calculated from the following:   Height as of this encounter: 6' 0.84" (1.85 m).   Weight as of this encounter: 107.5 kg (236 lb 15.9 oz). Patient also counseled that weight may inhibit the healing process Patient counseled that losing weight will help with future health issues     Chris Obrien. Chris Obrien   PAC  05/03/2012, 9:53 PM

## 2012-05-03 NOTE — Discharge Summary (Signed)
Physician Discharge Summary  Patient ID: Chris Obrien MRN: [id number] DOB/AGE: 1950-08-19 62 y.o.  Admit date: 04/27/2012 Discharge date: 04/29/2012   Procedures:  Procedure(s) (LRB): EXCISIONAL TOTAL KNEE ARTHROPLASTY WITH ANTIBIOTIC SPACERS (Right)  Attending Physician:  Dr. Durene Romans   Admission Diagnoses:   Fever with persistent knee pain S/P right TKA & I&D prepatellar bursal infection from contact dermatitis from poison ivy   Discharge Diagnoses:  Principal Problem:   S/P Right knee removal of prosthesis with antibiotic spacer Active Problems:   S/P right TKA   Right reactive prepatellar bursitis   Rash and nonspecific skin eruption   Normocytic anemia  Diagnosis  . Benign neoplasm of colon  . Insomnia, unspecified  . Oligospermia  . OSA on CPAP  . Swelling of joint of right knee  . Right knee skin infection  . BPH (benign prostatic hypertrophy)  . Arthritis  . Acute reactive arthritis - right pre-patella---  secondary contact dermitis with poison ivy exposure    HPI:    Pt is a 62 y.o. male complaining of right knee pain and fever. He had a right total knee arthroplasty on 01/28/2012 after a history of pain and functional disability in the right knee due to arthritis and has failed non-surgical conservative treatments for greater than 12 weeks to includeNSAID's and/or analgesics, use of assistive devices and activity modification. Onset of symptoms was gradual, starting >10 years ago with gradually worsening course since that time. The patient noted prior procedures on the knee to include arthroscopy x3, last being in August of 2012, on the right knee(s).  Eight weeks after the knee replacement he was helping clear some land and had a contact dermatitis reaction to poison ivy. Attempts were made to treat this with oral antibiotics but he had persistent prepatellar swelling. He then underwent a I&D of the prepatellar bursa area on 04/02/2012, with treatment of IV  antibiotics.  His latest visit to the clinic reveals fever with persistent knee pain. Various options are discussed with the patient. Risks, benefits and expectations were discussed with the patient. Patient understand the risks, benefits and expectations and wishes to proceed with surgery.    PCP: Sonda Primes, MD   Discharged Condition: good  Hospital Course:  Patient underwent the above stated procedure on 04/27/2012. Patient tolerated the procedure well and brought to the recovery room in good condition and subsequently to the floor.  POD #1 BP: 139/68 ; Pulse: 79 ; Temp: 98.3 F (36.8 C) ; Resp: 18 Pt's foley was removed, as well as the hemovac drain removed. IV was changed to a saline lock. Patient reports pain as mild, pain well controlled. No events throughout the night. ID consult ordered.  Neurovascular intact, Dorsiflexion/Plantar flexion intact and Incision: dressing C/D/I.  LABS  Basename  04/29/12    0550   HGB  9.7  HCT  29.7   POD #2  BP: 159/82 ; Pulse: 77 ; Temp: 98.4 F (36.9 C) ; Resp: 16  Patient reports pain as mild, pain well controlled. No events throughout the night. ID recs are in. He feels ready to be discharged home. Neurovascular intact, Dorsiflexion/Plantar flexion intact and Incision: dressing C/D/I.  LABS  Basename  04/29/12    0550   HGB  9.7  HCT  29.7    Discharge Exam: General appearance: alert, cooperative and no distress Extremities: Homans sign is negative, no sign of DVT, no edema, redness or tenderness in the calves or thighs and no ulcers,  gangrene or trophic changes  Disposition:  Home-Health Care Svc with follow up in 2 weeks   Follow-up Information   Follow up with [name], MD. Schedule an appointment as soon as possible for a visit in 2 weeks.   Contact information:   [address] [place] [address] [phone]       Follow up with Cliffton Asters, MD. Schedule an appointment as soon as possible for a  visit in 1 week.   Contact information:   [address] [place] [address] [phone]       Discharge Orders   Future Appointments Provider Department Dept Phone   05/07/2012 10:15 AM Cliffton Asters, MD [facility] [phone]   Future Orders Complete By Expires     Call MD / Call 911  As directed     Comments:      If you experience chest pain or shortness of breath, CALL 911 and be transported to the hospital emergency room.  If you develope a fever above 101 F, pus (white drainage) or increased drainage or redness at the wound, or calf pain, call your surgeon's office.    Change dressing  As directed     Comments:      Maintain surgical dressing for 10-14 days, then change the dressing daily with sterile 4 x 4 inch gauze dressing and tape. Keep the area dry and clean.    Constipation Prevention  As directed     Comments:      Drink plenty of fluids.  Prune juice may be helpful.  You may use a stool softener, such as Colace (over the counter) 100 mg twice a day.  Use MiraLax (over the counter) for constipation as needed.    Diet - low sodium heart healthy  As directed     Discharge instructions  As directed     Comments:      Maintain surgical dressing for 10-14 days, then replace with gauze and tape. Keep the area dry and clean until follow up. Follow up in 2 weeks at [facility]. Call with any questions or concerns.    Driving restrictions  As directed     Comments:      No driving for 4 weeks    Increase activity slowly as tolerated  As directed     TED hose  As directed     Comments:      Use stockings (TED hose) for 2 weeks on both leg(s).  You may remove them at night for sleeping.    Weight bearing as tolerated  As directed          Medication List    STOP taking these medications       ciprofloxacin 400 MG/200ML Soln  Commonly known as:  CIPRO      TAKE these medications       aspirin 325 MG  EC tablet  Take 1 tablet (325 mg total) by mouth 2 (two) times daily.     ceFAZolin 2-3 GM-% Solr  Commonly known as:  ANCEF  Inject 50 mLs (2 g total) into the vein every 8 (eight) hours.     diphenhydrAMINE 25 mg capsule  Commonly known as:  BENADRYL  Take 50-75 mg by mouth every 6 (six) hours as needed for itching.     DSS 100 MG Caps  Take 100 mg by mouth 2 (two) times daily.     ferrous  sulfate 325 (65 FE) MG tablet  Take 1 tablet (325 mg total) by mouth 3 (three) times daily after meals.     folic acid 1 MG tablet  Commonly known as:  FOLVITE  Take 1 mg by mouth daily.     HYDROcodone-acetaminophen 7.5-325 MG per tablet  Commonly known as:  NORCO  Take 1-2 tablets by mouth every 4 (four) hours as needed for pain.     multivitamin with minerals Tabs  Take 1 tablet by mouth daily.     orlistat 60 MG capsule  Commonly known as:  ALLI  Take 60 mg by mouth daily before breakfast.     polyethylene glycol packet  Commonly known as:  MIRALAX / GLYCOLAX  Take 17 g by mouth 2 (two) times daily.     predniSONE 10 MG tablet  Commonly known as:  DELTASONE  60mg  (6 tabs) po x 1 day, then 50mg  po on day 2, then 40 mg po on day 3, then 30mg  po on day 4, then 20mg  po on day 5, then 10mg  po on day 6, then stop.     tamsulosin 0.4 MG Caps  Commonly known as:  FLOMAX  Take 0.4 mg by mouth daily.     tiZANidine 4 MG capsule  Commonly known as:  ZANAFLEX  Take 1 capsule (4 mg total) by mouth 3 (three) times daily. Muscle spasms         Signed: Anastasio Auerbach. Jaidev Sanger   PAC  05/03/2012, 9:40 PM

## 2012-05-03 NOTE — Progress Notes (Addendum)
   Subjective: 1 Day Post-Op Procedure(s) (LRB): EXCISIONAL TOTAL KNEE ARTHROPLASTY WITH ANTIBIOTIC SPACERS (Right)   Patient reports pain as mild, pain well controlled. No events throughout the night. ID consult ordered.   Objective:   VITALS:   Filed Vitals:   04/28/12   BP: 139/68  Pulse: 79  Temp: 98.3 F (36.8 C)  Resp: 18    Neurovascular intact Dorsiflexion/Plantar flexion intact Incision: dressing C/D/I  LABS Recent Labs   04/28/12 0451   HGB  10.6   HCT  31.2   WBC  14.4   PLT  290    Recent Labs   04/28/12 0451   NA  140   K  4.5   BUN  16   CREATININE  1.06   GLUCOSE  105*     Assessment/Plan: 1 Day Post-Op Procedure(s) (LRB): EXCISIONAL TOTAL KNEE ARTHROPLASTY WITH ANTIBIOTIC SPACERS (Right) HV drain d/c'ed Foley cath d/c'ed Advance diet Up with therapy D/C IV fluids Discharge home with home health eventually when ready Awaiting ID recommendations  Obese (BMI 30-39.9) Estimated body mass index is 31.41 kg/(m^2) as calculated from the following:   Height as of this encounter: 6' 0.84" (1.85 m).   Weight as of this encounter: 107.5 kg (236 lb 15.9 oz). Patient also counseled that weight may inhibit the healing process Patient counseled that losing weight will help with future health issues      Chris Obrien. Chris Obrien   PAC  05/03/2012, 9:44 PM

## 2012-05-04 LAB — ANAEROBIC CULTURE

## 2012-05-06 ENCOUNTER — Other Ambulatory Visit (HOSPITAL_COMMUNITY): Payer: PRIVATE HEALTH INSURANCE

## 2012-05-06 ENCOUNTER — Emergency Department (HOSPITAL_COMMUNITY): Payer: PRIVATE HEALTH INSURANCE

## 2012-05-06 ENCOUNTER — Encounter (HOSPITAL_COMMUNITY): Payer: Self-pay

## 2012-05-06 ENCOUNTER — Emergency Department (HOSPITAL_COMMUNITY)
Admission: EM | Admit: 2012-05-06 | Discharge: 2012-05-06 | Disposition: A | Discharge status: Home / Self Care | Payer: PRIVATE HEALTH INSURANCE | Attending: Emergency Medicine | Admitting: Emergency Medicine

## 2012-05-06 DIAGNOSIS — Z9889 Other specified postprocedural states: Secondary | ICD-10-CM | POA: Insufficient documentation

## 2012-05-06 DIAGNOSIS — Z87891 Personal history of nicotine dependence: Secondary | ICD-10-CM | POA: Insufficient documentation

## 2012-05-06 DIAGNOSIS — M542 Cervicalgia: Secondary | ICD-10-CM | POA: Insufficient documentation

## 2012-05-06 DIAGNOSIS — M6281 Muscle weakness (generalized): Secondary | ICD-10-CM | POA: Insufficient documentation

## 2012-05-06 DIAGNOSIS — IMO0002 Reserved for concepts with insufficient information to code with codable children: Secondary | ICD-10-CM | POA: Insufficient documentation

## 2012-05-06 DIAGNOSIS — Z87448 Personal history of other diseases of urinary system: Secondary | ICD-10-CM | POA: Insufficient documentation

## 2012-05-06 DIAGNOSIS — G4700 Insomnia, unspecified: Secondary | ICD-10-CM | POA: Insufficient documentation

## 2012-05-06 DIAGNOSIS — Z79899 Other long term (current) drug therapy: Secondary | ICD-10-CM | POA: Insufficient documentation

## 2012-05-06 DIAGNOSIS — R29898 Other symptoms and signs involving the musculoskeletal system: Secondary | ICD-10-CM

## 2012-05-06 DIAGNOSIS — R51 Headache: Secondary | ICD-10-CM | POA: Insufficient documentation

## 2012-05-06 DIAGNOSIS — Z7982 Long term (current) use of aspirin: Secondary | ICD-10-CM | POA: Insufficient documentation

## 2012-05-06 DIAGNOSIS — Z872 Personal history of diseases of the skin and subcutaneous tissue: Secondary | ICD-10-CM | POA: Insufficient documentation

## 2012-05-06 DIAGNOSIS — M129 Arthropathy, unspecified: Secondary | ICD-10-CM | POA: Insufficient documentation

## 2012-05-06 DIAGNOSIS — G4733 Obstructive sleep apnea (adult) (pediatric): Secondary | ICD-10-CM | POA: Insufficient documentation

## 2012-05-06 DIAGNOSIS — Z8739 Personal history of other diseases of the musculoskeletal system and connective tissue: Secondary | ICD-10-CM | POA: Insufficient documentation

## 2012-05-06 DIAGNOSIS — Z8719 Personal history of other diseases of the digestive system: Secondary | ICD-10-CM | POA: Insufficient documentation

## 2012-05-06 DIAGNOSIS — M62838 Other muscle spasm: Secondary | ICD-10-CM

## 2012-05-06 LAB — COMPREHENSIVE METABOLIC PANEL
ALT: 22 U/L (ref 0–53)
AST: 29 U/L (ref 0–37)
Albumin: 2.7 g/dL — ABNORMAL LOW (ref 3.5–5.2)
Alkaline Phosphatase: 72 U/L (ref 39–117)
BUN: 25 mg/dL — ABNORMAL HIGH (ref 6–23)
CO2: 26 mEq/L (ref 19–32)
Calcium: 8.8 mg/dL (ref 8.4–10.5)
Chloride: 98 mEq/L (ref 96–112)
Creatinine, Ser: 0.98 mg/dL (ref 0.50–1.35)
GFR calc Af Amer: >90 mL/min (ref >90)
GFR calc non Af Amer: 86 mL/min — ABNORMAL LOW (ref >90)
Glucose, Bld: 97 mg/dL (ref 70–99)
Potassium: 4.8 mEq/L (ref 3.5–5.1)
Sodium: 131 mEq/L — ABNORMAL LOW (ref 135–145)
Total Bilirubin: 0.3 mg/dL (ref 0.3–1.2)
Total Protein: 7.1 g/dL (ref 6.0–8.3)

## 2012-05-06 LAB — CBC WITH DIFFERENTIAL/PLATELET
Basophils Absolute: 0.1 10*3/uL (ref 0.0–0.1)
Basophils Relative: 1 % (ref 0–1)
Eosinophils Absolute: 0.9 10*3/uL — ABNORMAL HIGH (ref 0.0–0.7)
Eosinophils Relative: 10 % — ABNORMAL HIGH (ref 0–5)
HCT: 38.2 % — ABNORMAL LOW (ref 39.0–52.0)
Hemoglobin: 12.7 g/dL — ABNORMAL LOW (ref 13.0–17.0)
Lymphocytes Relative: 22 % (ref 12–46)
Lymphs Abs: 2.1 10*3/uL (ref 0.7–4.0)
MCH: 27.9 pg (ref 26.0–34.0)
MCHC: 33.2 g/dL (ref 30.0–36.0)
MCV: 83.8 fL (ref 78.0–100.0)
Monocytes Absolute: 0.8 10*3/uL (ref 0.1–1.0)
Monocytes Relative: 9 % (ref 3–12)
Neutro Abs: 5.5 10*3/uL (ref 1.7–7.7)
Neutrophils Relative %: 58 % (ref 43–77)
Platelets: 372 10*3/uL (ref 150–400)
RBC: 4.56 MIL/uL (ref 4.22–5.81)
RDW: 14.4 % (ref 11.5–15.5)
WBC: 9.4 10*3/uL (ref 4.0–10.5)

## 2012-05-06 LAB — SEDIMENTATION RATE: Sed Rate: 57 mm/hr — ABNORMAL HIGH (ref 0–16)

## 2012-05-06 LAB — CK: Total CK: 26 U/L (ref 7–232)

## 2012-05-06 NOTE — ED Notes (Signed)
Pt sts abdominal muscle cramping x 3 days and has increased.  Sts shoulder and neck cramping "for a couple days."  Sts loss of strength in R arm.

## 2012-05-06 NOTE — Discharge Instructions (Signed)
I am concerned that she or her arm weakness is related to a pinched nerve in your neck. A CAT scan did not show that, but MRI scan is much better at looking at the nerves and spinal cord. MRI cannot be done today because of the staples in your incision. MRI probably should be done once the staples are removed in 2 days. Please call the neurosurgeon tomorrow for an appointment. If you cannot see the neurosurgeon right away, ask your PCP to order the MRI as an outpatient. Return if symptoms are getting worse.

## 2012-05-06 NOTE — ED Notes (Signed)
Patient transported to CT 

## 2012-05-06 NOTE — ED Provider Notes (Signed)
History     CSN: [id number]  Arrival date & time 05/06/12  1543   First MD Initiated Contact with Patient 05/06/12 1552      Chief Complaint  Patient presents with  . Weakness    (Consider location/radiation/quality/duration/timing/severity/associated sxs/prior treatment) Patient is a 62 y.o. male presenting with weakness. The history is provided by the patient.  Weakness  For the last 3 days, he is noticed weakness in his right arm. Weakness is neither getting better nor worse. During the same time, he has been having problems with muscle spasms which are present in his abdomen, right arm and right leg. He is status post placement of a spacer in his right knee because of an infected knee prosthesis and he has a PICC line in his right arm for ongoing antibiotic treatment. He denies fever, chills, sweats. Denies headache. He thought the weakness was due to to his PICC line she didn't tell anybody about it. He he talked with his orthopedic doctor today who recommended he come to the ED for evaluation. He he is not able to hold his right arm up against gravity.   Past Medical History  Diagnosis Date  . Benign neoplasm of colon   . Insomnia, unspecified   . Oligospermia   . OSA on CPAP   . Swelling of joint of right knee   . Right knee skin infection   . BPH (benign prostatic hypertrophy)     hx small stream  . Arthritis   . Acute reactive arthritis per pcp note from 03-30-2012    right pre-patella---  secondary contact dermitis with poison ivy exposure 03-26-2012    Past Surgical History  Procedure Laterality Date  . Knee arthroscopy Right aug 2012  . Ulnar nerve repair Right 2008  . 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 Irrigation and debridement knee Right 04/02/2012    Procedure: IRRIGATION AND DEBRIDEMENT RIGHT PRE-PATELLA  ;  Surgeon: [name], MD;  Location: [facility] [place];  Service: Orthopedics;  Laterality: Right;  . Excisional total knee arthroplasty with antibiotic spacers Right 04/27/2012    Procedure: EXCISIONAL TOTAL KNEE ARTHROPLASTY WITH ANTIBIOTIC SPACERS;  Surgeon: [name], MD;  Location: [facility];  Service: Orthopedics;  Laterality: Right;  SAME DAY LABS    Family History  Problem Relation Age of Onset  . Hyperlipidemia    . Alzheimer's disease Mother   . Arthritis Father   . Diabetes Brother     History  Substance Use Topics  . Smoking status: Former Smoker    Types: Cigars    Quit date: 01/08/2008  . Smokeless tobacco: Never Used  . Alcohol Use: Yes     Comment: occasionally      Review of Systems  Neurological: Positive for weakness.  All other systems reviewed and are negative.    Allergies  Vancomycin and Oxycodone  Home Medications   Current Outpatient Rx  Name  Route  Sig  Dispense  Refill  . aspirin 325 MG EC tablet   Oral   Take 1 tablet (325 mg total) by mouth 2 (two) times daily.   30 tablet      . ceFAZolin (ANCEF) 2-3 GM-% SOLR   Intravenous   Inject 50 mLs (2 g total) into the vein every 8 (eight) hours.   126 each   0   .  diphenhydrAMINE (BENADRYL) 25 mg capsule   Oral   Take 50-75 mg by mouth every 6 (six) hours as needed for itching.         . docusate sodium 100 MG CAPS   Oral   Take 100 mg by mouth 2 (two) times daily.   10 capsule      . ferrous sulfate 325 (65 FE) MG tablet   Oral   Take 1 tablet (325 mg total) by mouth 3 (three) times daily after meals.         . folic acid (FOLVITE) 1 MG tablet   Oral   Take 1 mg by mouth daily.         [name] HYDROcodone-acetaminophen (NORCO) 7.5-325 MG per tablet   Oral   Take 1-2 tablets by mouth every 4 (four) hours as needed for pain.   120 tablet   0   . Multiple Vitamin (MULTIVITAMIN WITH MINERALS) TABS   Oral   Take 1 tablet by mouth daily.         [name] orlistat (ALLI) 60 MG  capsule   Oral   Take 60 mg by mouth daily before breakfast.         . polyethylene glycol (MIRALAX / GLYCOLAX) packet   Oral   Take 17 g by mouth 2 (two) times daily.   14 each      . predniSONE (DELTASONE) 10 MG tablet      60mg  (6 tabs) po x 1 day, then 50mg  po on day 2, then 40 mg po on day 3, then 30mg  po on day 4, then 20mg  po on day 5, then 10mg  po on day 6, then stop.   21 tablet   0   . tamsulosin (FLOMAX) 0.4 MG CAPS   Oral   Take 0.4 mg by mouth daily.         [name] tiZANidine (ZANAFLEX) 4 MG capsule   Oral   Take 1 capsule (4 mg total) by mouth 3 (three) times daily. Muscle spasms   50 capsule   0     BP 123/63  Pulse 88  Temp(Src) 98.8 F (37.1 C) (Oral)  Resp 18  SpO2 98%  Physical Exam  Nursing note and vitals reviewed.  62 year old male, resting comfortably and in no acute distress. Vital signs are  normal . Oxygen saturation is 98%, which is normal. Head is normocephalic and atraumatic. PERRLA, EOMI. Oropharynx is clear. Neck is mildly tender without adenopathy or JVD. Back is nontender and there is no CVA tenderness. Lungs are clear without rales, wheezes, or rhonchi. Chest is nontender. Heart has regular rate and rhythm without murmur. Abdomen is soft, flat, nontender without masses or hepatosplenomegaly and peristalsis is normoactive. Extremities have no cyanosis or edema. PICC line is present in the right upper arm. Skin is warm and dry without rash. Neurologic: Mental status is normal, cranial nerves are intact, there are no sensory deficits. he has marked weakness of abduction of his right shoulder and cannot hold his arm up against gravity placing strength at 2/5. However, he has normal grip strength, strength on wrist extension, wrist flexion, elbow flexion, elbow flexion, and adduction at the shoulder. He has similar decreased strength of hip flexion on the right. Knee strength is not tested because of presence of this prosthesis spacer. He  has slight weakness of dorsiflexion and plantarflexion of the right with strength 4+/5.  ED Course  Procedures (including critical care time)  Results for  orders placed during the hospital encounter of 05/06/12  CBC WITH DIFFERENTIAL      Result Value Range   WBC 9.4  4.0 - 10.5 K/uL   RBC 4.56  4.22 - 5.81 MIL/uL   Hemoglobin 12.7 (*) 13.0 - 17.0 g/dL   HCT 69.6 (*) 29.5 - 28.4 %   MCV 83.8  78.0 - 100.0 fL   MCH 27.9  26.0 - 34.0 pg   MCHC 33.2  30.0 - 36.0 g/dL   RDW 13.2  44.0 - 10.2 %   Platelets 372  150 - 400 K/uL   Neutrophils Relative 58  43 - 77 %   Lymphocytes Relative 22  12 - 46 %   Monocytes Relative 9  3 - 12 %   Eosinophils Relative 10 (*) 0 - 5 %   Basophils Relative 1  0 - 1 %   Neutro Abs 5.5  1.7 - 7.7 K/uL   Lymphs Abs 2.1  0.7 - 4.0 K/uL   Monocytes Absolute 0.8  0.1 - 1.0 K/uL   Eosinophils Absolute 0.9 (*) 0.0 - 0.7 K/uL   Basophils Absolute 0.1  0.0 - 0.1 K/uL   WBC Morphology MILD LEFT SHIFT (1-5% METAS, OCC MYELO, OCC BANDS)    COMPREHENSIVE METABOLIC PANEL      Result Value Range   Sodium 131 (*) 135 - 145 mEq/L   Potassium 4.8  3.5 - 5.1 mEq/L   Chloride 98  96 - 112 mEq/L   CO2 26  19 - 32 mEq/L   Glucose, Bld 97  70 - 99 mg/dL   BUN 25 (*) 6 - 23 mg/dL   Creatinine, Ser 7.25  0.50 - 1.35 mg/dL   Calcium 8.8  8.4 - 36.6 mg/dL   Total Protein 7.1  6.0 - 8.3 g/dL   Albumin 2.7 (*) 3.5 - 5.2 g/dL   AST 29  0 - 37 U/L   ALT 22  0 - 53 U/L   Alkaline Phosphatase 72  39 - 117 U/L   Total Bilirubin 0.3  0.3 - 1.2 mg/dL   GFR calc non Af Amer 86 (*) >90 mL/min   GFR calc Af Amer >90  >90 mL/min  CK      Result Value Range   Total CK 26  7 - 232 U/L  SEDIMENTATION RATE      Result Value Range   Sed Rate 57 (*) 0 - 16 mm/hr   Ct Head Wo Contrast  05/06/2012  *RADIOLOGY REPORT*  Clinical Data: Weakness.  CT HEAD WITHOUT CONTRAST  Technique:  Contiguous axial images were obtained from the base of the skull through the vertex without contrast.   Comparison: None  Findings: The ventricles are normal.  No extra-axial fluid collections are seen.  The brainstem and cerebellum are unremarkable.  No acute intracranial findings such as infarction or hemorrhage.  No mass lesions.  The bony calvarium is intact.  The visualized paranasal sinuses and mastoid air cells are clear.  IMPRESSION: No acute intracranial findings or mass lesion.   Original Report Authenticated By: Rudie Meyer, M.D.    Ct Cervical Spine Wo Contrast  05/06/2012  *RADIOLOGY REPORT*  Clinical Data: Neck and head pain.  CT CERVICAL SPINE WITHOUT CONTRAST  Technique:  Multidetector CT imaging of the cervical spine was performed. Multiplanar CT image reconstructions were also generated.  Comparison: None.  Findings: Imaging was obtained from the skull base to the T1-T2 interspace.  No evidence for fracture.  No subluxation.  There is loss of disc height is C5-6 and C6-7 with associated endplate degeneration.  The facets are well-aligned bilaterally.  No prevertebral soft tissue swelling.  IMPRESSION: No evidence for cervical spine fracture.   Original Report Authenticated By: Kennith Center, M.D.      ECG shows normal sinus rhythm with a rate of 86, no ectopy. Normal axis. Normal P wave. Normal QRS. Normal intervals. Normal ST and T waves. Impression: normal ECG. No prior ECG available for comparison   1. Weakness of right arm   2. Muscle spasm     MDM  Right arm weakness which is isolated to the deltoid muscle worrisome for cervical radiculopathy. He'll be sent for CT to look for evidence of the recent stroke and MR of his cervical spine to look for evidence of I nerve root compression. Potassium level be checked as well CK. Old records of been reviewed and he had initial right knee arthroplasty in January and then arthroplasty removed for infection in March with placement of a knee spacer.  MRI technician for [facility] at MRI cannot be done because he has surgical staples in his leg. CT  was done in its place. Head CT and neck CT were unremarkable. I still feel that he likely has a cervical radiculopathy as the cause of his weakness. His staples are to be removed in 2 days, so MRI can be done after that. He is referred to neurosurgery for followup.        Dione Booze, MD 05/06/12 [phone]

## 2012-05-06 NOTE — ED Notes (Signed)
Pt escorted to discharge window. Verbalized understanding discharge instructions. In no acute distress. Vitals reviewed and WDL.  

## 2012-05-06 NOTE — ED Notes (Signed)
Per EMS pt had knee surgery last Monday to removed his placement d/t infection. Pt c/o muscle weakness to rt muscle/arm, cramping to lower abdomen.

## 2012-05-06 NOTE — ED Notes (Signed)
[id number]<AV> Expected date:<BR> Expected time:<BR> Means of arrival:<BR> Comments:<BR> weakness

## 2012-05-07 ENCOUNTER — Ambulatory Visit (INDEPENDENT_AMBULATORY_CARE_PROVIDER_SITE_OTHER): Payer: PRIVATE HEALTH INSURANCE | Admitting: Internal Medicine

## 2012-05-07 ENCOUNTER — Encounter: Payer: Self-pay | Admitting: Internal Medicine

## 2012-05-07 VITALS — Temp 97.9°F | Ht 73.0 in | Wt 216.5 lb

## 2012-05-07 DIAGNOSIS — M5412 Radiculopathy, cervical region: Secondary | ICD-10-CM

## 2012-05-07 NOTE — Progress Notes (Signed)
Patient ID: Chris Obrien, male   DOB: 07/21/1950, 62 y.o.   MRN: [id number]         [facility]  Patient Active Problem List   Diagnosis Date Noted  . S/P Right knee removal of prosthesis with antibiotic spacer 04/27/2012    Priority: High  . Cervical radiculopathy 05/07/2012  . Obesity (BMI 30-39.9) 05/03/2012  . Normocytic anemia 04/28/2012  . Rash and nonspecific skin eruption 04/25/2012  . Right reactive prepatellar bursitis 04/02/2012  . Expected blood loss anemia 01/29/2012  . Obesity (BMI 30.0-34.9) 01/29/2012  . S/P right TKA 01/28/2012  . Well adult exam 09/24/2011  . Knee pain 09/24/2011  . OBSTRUCTIVE SLEEP APNEA 02/01/2008  . SNORING 11/19/2007  . DYSPNEA 07/29/2007  . CHEST PAIN, UNSPECIFIED 07/29/2007  . POLYP, COLON 05/30/2007  . IRREGULAR HEART RATE 05/30/2007  . INSOMNIA 05/30/2007  . BENIGN PROSTATIC HYPERTROPHY, HX OF 05/30/2007  . ARTHROSCOPY, KNEE, HX OF 05/30/2007    Patient's Medications  New Prescriptions   No medications on file  Previous Medications   ACETAMINOPHEN (TYLENOL) 500 MG TABLET    Take 500 mg by mouth every 6 (six) hours as needed for pain.   ASPIRIN 325 MG EC TABLET    Take 325 mg by mouth daily.   CEFAZOLIN (ANCEF) 2-3 GM-% SOLR    Inject 50 mLs (2 g total) into the vein every 8 (eight) hours.   DIPHENHYDRAMINE (BENADRYL) 25 MG CAPSULE    Take 50-75 mg by mouth every 6 (six) hours as needed for itching.   DOCUSATE SODIUM (COLACE) 100 MG CAPSULE    Take 100 mg by mouth 2 (two) times daily as needed for constipation.   FERROUS SULFATE 325 (65 FE) MG TABLET    Take 325 mg by mouth 3 (three) times daily with meals.   FOLIC ACID (FOLVITE) 1 MG TABLET    Take 1 mg by mouth daily.   HYDROCODONE-ACETAMINOPHEN (NORCO) 7.5-325 MG PER TABLET    Take 1-2 tablets by mouth every 6 (six) hours as needed for pain.   MULTIPLE VITAMIN (MULTIVITAMIN WITH MINERALS) TABS    Take 1 tablet by mouth daily.   ORLISTAT (ALLI) 60  MG CAPSULE    Take 60 mg by mouth daily before breakfast.   TAMSULOSIN (FLOMAX) 0.4 MG CAPS    Take 0.4 mg by mouth daily.   TIZANIDINE (ZANAFLEX) 4 MG TABLET    Take 4 mg by mouth every 8 (eight) hours as needed.   ZOLPIDEM (AMBIEN) 5 MG TABLET    Take 5 mg by mouth at bedtime as needed for sleep.  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 He had an uneventful recovery until he developed a bad case of poison ivy after clearing some brush in March. The poison ivy rash began to resolve but then he developed acute pain, swelling and redness over his right knee. It did not respond well to oral antibiotic therapy with Septra and Augmentin. Bursal fluid culture on March 24 grew methicillin sensitive staph aureus. He was admitted to the hospital on March 27 for incision and drainage of the bursa. Gram stain and cultures of that specimen were negative. He was discharged home on IV vancomycin and oral rifampin. Says that his knee improved but he had low-grade fevers  about every day up to 101. Six days ago he began to notice progressive erythematous rash that worse whenever he confused vancomycin. He also began noticing higher fevers up to 104. His vancomycin was stopped because of concerns about red man reaction. He was switched to ciprofloxacin and the rash and fevers began to resolve but because of concerns about persistent infection of his artificial joint he was readmitted yesterday and underwent resection arthroplasty and placement of a spacer. Gram stain of that operative specimen does not reveal any organisms. Operative cultures were negative and he was discharged home on IV cefazolin. He's had no problems tolerating his PICC or cefazolin. He continues to be bothered by some generalized erythema and mild desquamation but overall  his rash is improving. He has not had any fever, chills or sweats. He is due to follow up with Dr. Charlann Boxer tomorrow and probably will have to is knee staples removed.  He says that about 2 weeks ago he then began to have intermittent sharp, shooting pains down his left shoulder and arm. Four days ago he began to notice some pain in his right shoulder particularly when he turned his head to the left. For the past 2 days he has developed weakness of his right upper arm. He been seeing a physical therapist for his knee who evaluated his shoulder and said he had significant weakness with abduction. He was seen in the emergency department yesterday by Dr. Preston Fleeting who diagnosed probable cervical radiculopathy. A cervical CT scan did not reveal any acute abnormalities. An MRI could not be done because of the staples in his right knee. He was referred to neurosurgery but no appointment was made. He is requesting help facilitating that referral.    Objective: Temp: 97.9 F (36.6 C) (05/01 1021) Temp src: Oral (05/01 1021)  General: He is in no distress Skin: Right arm PICC site appears normal. He still has some diffuse erythema more prominent in his lower extremities with some mild generalized desquamation Lungs: Clear Cor: Regular S1 and S2 no murmurs Abdomen: Soft and nontender Right knee: Remains slightly swollen but has no significant tenderness or warmth. He walks without much difficulty with eating a rolling walker. He winces in pain when his neck is flexed to the left side due to pain shooting down his right shoulder. He has marked weakness when trying to 80s his right arm against gravity.  No results found for this basename: CRP   Lab Results  Component Value Date   ESRSEDRATE 57* 05/06/2012   CT CERVICAL SPINE WITHOUT CONTRAST 05/06/12  Technique: Multidetector CT imaging of the cervical spine was  performed. Multiplanar CT image reconstructions were also  generated.  Comparison: None.    Findings: Imaging was obtained from the skull base to the T1-T2  interspace. No evidence for fracture. No subluxation. There is  loss of disc height is C5-6 and C6-7 with associated endplate  degeneration. The facets are well-aligned bilaterally. No  prevertebral soft tissue swelling.  IMPRESSION:  No evidence for cervical spine fracture.  Original Report Authenticated By: Kennith Center, M.D.    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 He is now had 10 days of IV cefazolin after excision arthroplasty. Given that knee cultures were negative it's unclear to me if he actually did have prosthetic joint infection. I will need to talk to Dr. Charlann Boxer about the  duration of his postoperative IV antibiotic therapy and timing of reimplantation of a new joint.  He probably had a hypersensitivity reaction to vancomycin and it appears to be resolving slowly.  I agree that he is probably developed acute cervical radiculopathy and right shoulder weakness. Return to facilitate neurosurgery evaluation. I left a message for Dr. Orbie Hurst to call me when he is out of the operating room today.   Plan: 1. Continue cefazolin 2. Follow up here on May 22 3. Neurosurgery referral   Cliffton Asters, MD [facility] [phone] pager   [phone] cell 05/07/2012, 10:54 AM

## 2012-05-19 ENCOUNTER — Telehealth: Payer: Self-pay

## 2012-05-19 DIAGNOSIS — Z96651 Presence of right artificial knee joint: Secondary | ICD-10-CM

## 2012-05-19 MED ORDER — CEFAZOLIN SODIUM-DEXTROSE 2-3 GM-% IV SOLR: 2.0000 g | Freq: Three times a day (TID) | INTRAVENOUS | Status: AC

## 2012-05-19 MED ORDER — CEPHALEXIN 500 MG PO CAPS: 500.0000 mg | ORAL_CAPSULE | Freq: Four times a day (QID) | ORAL | Status: DC

## 2012-05-19 NOTE — Telephone Encounter (Signed)
Pt is due to end  IV Antibiotic therapy on Wednesday  This week per original orders he wants to verify this is correct.   He is currently at his vacation home in [place] and has been receiving PT for his knee replacement surgery but no labs were done due  to the fact [place] does not  accept [facility] physician orders.  He will be retuning to [facility] next week on Wednesday.  Is it ok for him to wait for labs?    Laurell Josephs, RN

## 2012-05-19 NOTE — Telephone Encounter (Signed)
Chris Obrien has now been on antibiotic therapy for about 8 weeks total including 3 weeks of IV cefazolin since he had excision arthroplasty of his prosthetic knee for probable MSSA infection. He is currently at his vacation home in [place] and has enough IV Ancef to last through tomorrow. He is planning on returning to [facility] until May he 19. I will call in on oral cephalexin for him to take until he sees me next week.

## 2012-05-27 ENCOUNTER — Encounter: Payer: Self-pay | Admitting: Orthopedic Surgery

## 2012-05-28 ENCOUNTER — Ambulatory Visit (INDEPENDENT_AMBULATORY_CARE_PROVIDER_SITE_OTHER): Payer: PRIVATE HEALTH INSURANCE | Admitting: Internal Medicine

## 2012-05-28 ENCOUNTER — Encounter: Payer: Self-pay | Admitting: Internal Medicine

## 2012-05-28 VITALS — BP 126/67 | HR 85 | Temp 98.8°F | Ht 73.0 in | Wt 224.0 lb

## 2012-05-28 DIAGNOSIS — M7041 Prepatellar bursitis, right knee: Secondary | ICD-10-CM

## 2012-05-28 DIAGNOSIS — Z89529 Acquired absence of unspecified knee: Secondary | ICD-10-CM

## 2012-05-28 DIAGNOSIS — M7040 Prepatellar bursitis, unspecified knee: Secondary | ICD-10-CM

## 2012-05-28 NOTE — Progress Notes (Signed)
Patient ID: Chris Obrien, male   DOB: Jul 08, 1950, 62 y.o.   MRN: [id number]         [facility]  Patient Active Problem List   Diagnosis Date Noted  . S/P Right knee removal of prosthesis with antibiotic spacer 04/27/2012    Priority: High  . Cervical radiculopathy 05/07/2012  . Obesity (BMI 30-39.9) 05/03/2012  . Normocytic anemia 04/28/2012  . Rash and nonspecific skin eruption 04/25/2012  . Right reactive prepatellar bursitis 04/02/2012  . Expected blood loss anemia 01/29/2012  . Obesity (BMI 30.0-34.9) 01/29/2012  . S/P right TKA 01/28/2012  . Well adult exam 09/24/2011  . Knee pain 09/24/2011  . OBSTRUCTIVE SLEEP APNEA 02/01/2008  . SNORING 11/19/2007  . DYSPNEA 07/29/2007  . CHEST PAIN, UNSPECIFIED 07/29/2007  . POLYP, COLON 05/30/2007  . IRREGULAR HEART RATE 05/30/2007  . INSOMNIA 05/30/2007  . BENIGN PROSTATIC HYPERTROPHY, HX OF 05/30/2007  . ARTHROSCOPY, KNEE, HX OF 05/30/2007    Patient's Medications  New Prescriptions   No medications on file  Previous Medications   ACETAMINOPHEN (TYLENOL) 500 MG TABLET    Take 500 mg by mouth every 6 (six) hours as needed for pain.   ASPIRIN 325 MG EC TABLET    Take 325 mg by mouth daily.   DIPHENHYDRAMINE (BENADRYL) 25 MG CAPSULE    Take 50-75 mg by mouth every 6 (six) hours as needed for itching.   DOCUSATE SODIUM (COLACE) 100 MG CAPSULE    Take 100 mg by mouth 2 (two) times daily as needed for constipation.   FERROUS SULFATE 325 (65 FE) MG TABLET    Take 325 mg by mouth 3 (three) times daily with meals.   FOLIC ACID (FOLVITE) 1 MG TABLET    Take 1 mg by mouth daily.   HYDROCODONE-ACETAMINOPHEN (NORCO) 7.5-325 MG PER TABLET    Take 1-2 tablets by mouth every 6 (six) hours as needed for pain.   MULTIPLE VITAMIN (MULTIVITAMIN WITH MINERALS) TABS    Take 1 tablet by mouth daily.   ORLISTAT (ALLI) 60 MG CAPSULE    Take 60 mg by mouth daily before breakfast.   TAMSULOSIN (FLOMAX) 0.4 MG CAPS    Take  0.4 mg by mouth daily.   TIZANIDINE (ZANAFLEX) 4 MG TABLET    Take 4 mg by mouth every 8 (eight) hours as needed.   ZOLPIDEM (AMBIEN) 5 MG TABLET    Take 5 mg by mouth at bedtime as needed for sleep.  Modified Medications   No medications on file  Discontinued Medications   CEPHALEXIN (KEFLEX) 500 MG CAPSULE    Take 1 capsule (500 mg total) by mouth 4 (four) times daily.    Subjective: Mr. Ickes is in for his routine followup visit. He is now completed a little over 9 weeks of total antibiotic therapy for right prosthetic knee infection including 4-1/2 weeks of antibiotics after removal of all hardware. He was in [place] for vacation recently and had his PICC removed because he was having yellow drainage from the insertion site. He stopped taking oral cephalexin yesterday he at the request of Dr. Charlann Boxer who wants to follow him off of antibiotics before considering revision arthroplasty on June 9.  He is no longer having any shooting pains in his neck and right shoulder but he still has extreme weakness of his proximal right arm. He did have an MRI of the cervical spine but the result is not in EPIC. He says that he was  told that there was no cause found for his weakness. He is scheduled for an MRI of his right shoulder tomorrow.  He has not had any fever, chills or sweats. His right knee is feeling much better and he has been much more active. He says that Dr. Charlann Boxer removed a small amount of clear yellow fluid from his right knee yesterday.   Review of Systems: Pertinent items are noted in HPI.  Past Medical History  Diagnosis Date  . Benign neoplasm of colon   . Insomnia, unspecified   . Oligospermia   . OSA on CPAP   . Swelling of joint of right knee   . Right knee skin infection   . BPH (benign prostatic hypertrophy)     hx small stream  . Arthritis   . Acute reactive arthritis per pcp note from 03-30-2012    right pre-patella---  secondary contact dermitis with poison ivy exposure  03-26-2012    History  Substance Use Topics  . Smoking status: Former Smoker    Types: Cigars    Quit date: 01/08/2008  . Smokeless tobacco: Never Used  . Alcohol Use: Yes     Comment: occasionally    Family History  Problem Relation Age of Onset  . Hyperlipidemia    . Alzheimer's disease Mother   . Arthritis Father   . Diabetes Brother     Allergies  Allergen Reactions  . Vancomycin Rash    Severe redness all over body with swollen ankles and hands  . Oxycodone Rash    Objective: Temp: 98.8 F (37.1 C) (05/22 0840) Temp src: Oral (05/22 0840) BP: 126/67 mmHg (05/22 0840) Pulse Rate: 85 (05/22 0840)  General: He is in good spirits Skin: Previous right arm PICC site healing nicely without any evidence of infection. His rash has resolved His right knee remains swollen but it is not acutely red, warm or painful   Assessment: I think there is a very good chance that his right MSSA prepatellar bursitis and possible right knee infection are cured. I agree with a period of observation off of antibiotics before considering revision arthroplasty.  Plan: 1. Observe off of antibiotics   Cliffton Asters, MD [facility] [phone] pager   [phone] cell 05/28/2012, 9:00 AM

## 2012-06-03 ENCOUNTER — Encounter (HOSPITAL_COMMUNITY): Payer: Self-pay | Admitting: Pharmacy Technician

## 2012-06-03 ENCOUNTER — Encounter (HOSPITAL_COMMUNITY): Payer: Self-pay

## 2012-06-03 ENCOUNTER — Encounter (HOSPITAL_COMMUNITY)
Admission: RE | Admit: 2012-06-03 | Discharge: 2012-06-03 | Disposition: A | Discharge status: Home / Self Care | Payer: PRIVATE HEALTH INSURANCE | Source: Ambulatory Visit | Attending: Orthopedic Surgery | Admitting: Orthopedic Surgery

## 2012-06-03 HISTORY — DX: Pain, unspecified: R52

## 2012-06-03 LAB — SURGICAL PCR SCREEN
MRSA, PCR: NEGATIVE
Staphylococcus aureus: NEGATIVE

## 2012-06-03 LAB — URINALYSIS, ROUTINE W REFLEX MICROSCOPIC
Glucose, UA: NEGATIVE mg/dL
Hgb urine dipstick: NEGATIVE
Ketones, ur: NEGATIVE mg/dL
Leukocytes, UA: NEGATIVE
Nitrite: NEGATIVE
Protein, ur: NEGATIVE mg/dL
Specific Gravity, Urine: 1.029 (ref 1.005–1.030)
Urobilinogen, UA: 0.2 mg/dL (ref 0.0–1.0)
pH: 5 (ref 5.0–8.0)

## 2012-06-03 LAB — CBC
HCT: 38.3 % — ABNORMAL LOW (ref 39.0–52.0)
Hemoglobin: 12.6 g/dL — ABNORMAL LOW (ref 13.0–17.0)
MCH: 27.9 pg (ref 26.0–34.0)
MCHC: 32.9 g/dL (ref 30.0–36.0)
MCV: 84.7 fL (ref 78.0–100.0)
Platelets: 301 10*3/uL (ref 150–400)
RBC: 4.52 MIL/uL (ref 4.22–5.81)
RDW: 15.1 % (ref 11.5–15.5)
WBC: 4.5 10*3/uL (ref 4.0–10.5)

## 2012-06-03 LAB — BASIC METABOLIC PANEL
BUN: 18 mg/dL (ref 6–23)
CO2: 29 mEq/L (ref 19–32)
Calcium: 9.4 mg/dL (ref 8.4–10.5)
Chloride: 104 mEq/L (ref 96–112)
Creatinine, Ser: 1.14 mg/dL (ref 0.50–1.35)
GFR calc Af Amer: 77 mL/min — ABNORMAL LOW (ref >90)
GFR calc non Af Amer: 67 mL/min — ABNORMAL LOW (ref >90)
Glucose, Bld: 102 mg/dL — ABNORMAL HIGH (ref 70–99)
Potassium: 5.1 mEq/L (ref 3.5–5.1)
Sodium: 140 mEq/L (ref 135–145)

## 2012-06-03 LAB — PROTIME-INR
INR: 1.09 (ref 0.00–1.49)
Prothrombin Time: 14 seconds (ref 11.6–15.2)

## 2012-06-03 LAB — APTT: aPTT: 33 seconds (ref 24–37)

## 2012-06-03 NOTE — Pre-Procedure Instructions (Signed)
EKG REPORT 05/06/12 IN EPIC. CXR REPORT 04/02/12 IN EPIC.

## 2012-06-03 NOTE — Patient Instructions (Addendum)
YOUR SURGERY IS SCHEDULED AT [facility]  ON:  Monday  June 9TH  REPORT TO [place] [facility] AT:  5:30 AM      PHONE # FOR SHORT STAY IS [phone]  DO NOT EAT OR DRINK ANYTHING AFTER MIDNIGHT THE NIGHT BEFORE YOUR SURGERY.  YOU MAY BRUSH YOUR TEETH, RINSE OUT YOUR MOUTH--BUT NO WATER, NO FOOD, NO CHEWING GUM, NO MINTS, NO CANDIES, NO CHEWING TOBACCO.  PLEASE TAKE THE FOLLOWING MEDICATIONS THE AM OF YOUR SURGERY WITH A FEW SIPS OF WATER:  NO MEDS TO TAKE   DO NOT BRING VALUABLES, MONEY, CREDIT CARDS.  DO NOT WEAR JEWELRY, MAKE-UP, NAIL POLISH AND NO METAL PINS OR CLIPS IN YOUR HAIR. CONTACT LENS, DENTURES / PARTIALS, GLASSES SHOULD NOT BE WORN TO SURGERY AND IN MOST CASES-HEARING AIDS WILL NEED TO BE REMOVED.  BRING YOUR GLASSES CASE, ANY EQUIPMENT NEEDED FOR YOUR CONTACT LENS. FOR PATIENTS ADMITTED TO THE HOSPITAL--CHECK OUT TIME THE DAY OF DISCHARGE IS 11:00 AM.  ALL INPATIENT ROOMS ARE PRIVATE - WITH BATHROOM, TELEPHONE, TELEVISION AND WIFI INTERNET.                               PLEASE READ OVER ANY  FACT SHEETS THAT YOU WERE GIVEN: MRSA INFORMATION, BLOOD TRANSFUSION INFORMATION, INCENTIVE SPIROMETER INFORMATION. FAILURE TO FOLLOW THESE INSTRUCTIONS MAY RESULT IN THE CANCELLATION OF YOUR SURGERY.   PATIENT SIGNATURE_________________________________

## 2012-06-04 ENCOUNTER — Telehealth: Payer: Self-pay | Admitting: *Deleted

## 2012-06-04 NOTE — Telephone Encounter (Signed)
I will refer. I do not think  Dr Phoebe Perch will see him w/o prior work up. Thx

## 2012-06-04 NOTE — Telephone Encounter (Signed)
I'll be happy to see him to find out what we need to be doing Thx

## 2012-06-04 NOTE — Telephone Encounter (Signed)
He does NOT want to see Dr Phoebe Perch.

## 2012-06-04 NOTE — Telephone Encounter (Signed)
Pt left vm requesting referral to a neurologist for hs Right shoulder numbness and pain. He says he has no ROM in his deltoid. He requests not to be sent to Dr. Phoebe Perch.

## 2012-06-05 NOTE — Telephone Encounter (Signed)
Chris Obrien, [facility] spoke to pt- He is aware of what he needs to do at this time.

## 2012-06-14 NOTE — H&P (Signed)
TOTAL KNEE REVISION ADMISSION H&P  Patient is being admitted for right revision total knee arthroplasty.  Subjective:  Chief Complaint:   Right knee pain antibiotic spacer  HPI: Chris Obrien, 62 y.o. male,  to have a reimplantation of his right total knee arthroplasty after removing an antibiotic spacer. He had the TKA removed on 04/27/2012 with a formed antibiotic place placed do to a knee infection.  He originally had a right total knee arthroplasty on 01/28/2012 after a history of pain and functional disability in the right knee due to arthritis and has failed non-surgical conservative treatments for greater than 12 weeks to includeNSAID's and/or analgesics, use of assistive devices and activity modification. Onset of symptoms was gradual, starting >10 years ago with gradually worsening course since that time. The patient noted prior procedures on the knee to include arthroscopy x3, last being in August of 2012, on the right knee(s).  Eight weeks after the knee replacement he was helping clear some land and had a contact dermatitis reaction to poison ivy. Attempts were made to treat this with oral antibiotics but he had persistent prepatellar swelling. He then underwent a I&D of the prepatellar bursa area on 04/02/2012, with treatment of IV antibiotics. The attempts did not work and the TKA prosthesis was removed.  He has been to the clinic recently with and with signs of resolved infection of the knee he would like to proceed with a reimplantation of the TKA.  Various options are discussed with the patient. Risks, benefits and expectations were discussed with the patient. Patient understand the risks, benefits and expectations and wishes to proceed with surgery.   D/C Plans:   Home with HHPT  Post-op Meds:   No Rx given  Tranexamic Acid:   Not to be given  Decadron:    Not to be given  FYI:      Patient Active Problem List   Diagnosis Date Noted  . Cervical radiculopathy 05/07/2012  .  Obesity (BMI 30-39.9) 05/03/2012  . Normocytic anemia 04/28/2012  . S/P Right knee removal of prosthesis with antibiotic spacer 04/27/2012  . Rash and nonspecific skin eruption 04/25/2012  . Right reactive prepatellar bursitis 04/02/2012  . Expected blood loss anemia 01/29/2012  . Obesity (BMI 30.0-34.9) 01/29/2012  . S/P right TKA 01/28/2012  . Well adult exam 09/24/2011  . Knee pain 09/24/2011  . OBSTRUCTIVE SLEEP APNEA 02/01/2008  . SNORING 11/19/2007  . DYSPNEA 07/29/2007  . CHEST PAIN, UNSPECIFIED 07/29/2007  . POLYP, COLON 05/30/2007  . IRREGULAR HEART RATE 05/30/2007  . INSOMNIA 05/30/2007  . BENIGN PROSTATIC HYPERTROPHY, HX OF 05/30/2007  . ARTHROSCOPY, KNEE, HX OF 05/30/2007   Past Medical History  Diagnosis Date  . Benign neoplasm of colon   . Insomnia, unspecified   . Oligospermia   . Swelling of joint of right knee   . Right knee skin infection   . BPH (benign prostatic hypertrophy)     hx small stream  . Arthritis   . Acute reactive arthritis per pcp note from 03-30-2012    right pre-patella---  secondary contact dermitis with poison ivy exposure 03-26-2012  . OSA on CPAP     PT LOST  WEIGHT - NO LONGER USING CPAP  . Pain     RIGHT SHOULDER DELTOID MUSCLE ATROPHY - LOSS OF STRENGT VERY LIMITED RANGE OF MOTTION    Past Surgical History  Procedure Laterality Date  . Knee arthroscopy Right aug 2012  . Ulnar nerve repair Right 2008  .  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 Irrigation and debridement knee Right 04/02/2012    Procedure: IRRIGATION AND DEBRIDEMENT RIGHT PRE-PATELLA  ;  Surgeon: [name], MD;  Location: [facility];  Service: Orthopedics;  Laterality: Right;  . Excisional total knee arthroplasty with antibiotic spacers Right 04/27/2012    Procedure: EXCISIONAL TOTAL KNEE ARTHROPLASTY WITH ANTIBIOTIC  SPACERS;  Surgeon: [name], MD;  Location: [facility];  Service: Orthopedics;  Laterality: Right;  SAME DAY LABS    No prescriptions prior to admission   Allergies  Allergen Reactions  . Vancomycin Rash    Severe redness all over body with swollen ankles and hands  . Oxycodone Rash    History  Substance Use Topics  . Smoking status: Former Smoker    Types: Cigars    Quit date: 01/08/2008  . Smokeless tobacco: Never Used  . Alcohol Use: Yes     Comment: occasionally    Family History  Problem Relation Age of Onset  . Hyperlipidemia    . Alzheimer's disease Mother   . Arthritis Father   . Diabetes Brother      Review of Systems  Constitutional: Negative.   HENT: Negative.   Eyes: Negative.   Respiratory: Negative.   Cardiovascular: Negative.   Gastrointestinal: Negative.   Genitourinary: Negative.   Musculoskeletal: Positive for myalgias and joint pain.  Neurological: Positive for tingling and sensory change.  Endo/Heme/Allergies: Negative.      Objective:  Physical Exam  Constitutional: He is oriented to person, place, and time. He appears well-developed and well-nourished.  HENT:  Head: Normocephalic and atraumatic.  Mouth/Throat: Oropharynx is clear and moist.  Eyes: Pupils are equal, round, and reactive to light.  Neck: Neck supple. No JVD present. No tracheal deviation present. No thyromegaly present.  Cardiovascular: Intact distal pulses.   Respiratory: Effort normal and breath sounds normal. No respiratory distress.  GI: Soft. There is no tenderness. There is no guarding.  Musculoskeletal:       Right knee: He exhibits decreased range of motion, swelling and laceration (healed from previous surgeries). He exhibits no effusion. Tenderness found.  Lymphadenopathy:    He has no cervical adenopathy.  Neurological: He is alert and oriented to person, place, and time.  Skin: Skin is warm and dry.  Psychiatric: He has a normal mood and affect.      Labs:  Estimated body mass index is 31.41 kg/(m^2) as calculated from the following:   Height as of 04/27/12: 6' 0.83" (1.85 m).   Weight as of 04/27/12: 107.5 kg (236 lb 15.9 oz).    Assessment/Plan:  Right knee antibiotic spacer after removal of prosthesis from an infected total joint.   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 Revision total knee arthroplasty is deemed medically necessary. The treatment options including medical management, injection therapy, arthroscopy and revision arthroplasty were discussed at length. The risks and benefits of revision total knee arthroplasty were presented and reviewed. The risks due to aseptic loosening, infection, stiffness, patella tracking problems, thromboembolic complications and other imponderables were discussed. The patient acknowledged the explanation, agreed to proceed with the plan and consent was signed. Patient is being admitted for inpatient treatment for surgery, pain control, PT,  OT, prophylactic antibiotics, VTE prophylaxis, progressive ambulation and ADL's and discharge planning.The patient is planning to be discharged home with home health services.   Anastasio Auerbach Rutherford Alarie   PAC  06/14/2012, 8:08 PM

## 2012-06-15 ENCOUNTER — Encounter (HOSPITAL_COMMUNITY)
Admission: RE | Disposition: A | Discharge status: Home Health | Payer: Self-pay | Source: Ambulatory Visit | Attending: Orthopedic Surgery

## 2012-06-15 ENCOUNTER — Encounter (HOSPITAL_COMMUNITY): Payer: Self-pay

## 2012-06-15 ENCOUNTER — Inpatient Hospital Stay (HOSPITAL_COMMUNITY)
Admission: RE | Admit: 2012-06-15 | Discharge: 2012-06-16 | DRG: 467 | Disposition: A | Discharge status: Home Health | Payer: PRIVATE HEALTH INSURANCE | Source: Ambulatory Visit | Attending: Orthopedic Surgery | Admitting: Orthopedic Surgery

## 2012-06-15 ENCOUNTER — Encounter (HOSPITAL_COMMUNITY): Payer: Self-pay | Admitting: Anesthesiology

## 2012-06-15 ENCOUNTER — Inpatient Hospital Stay (HOSPITAL_COMMUNITY): Payer: PRIVATE HEALTH INSURANCE | Admitting: Anesthesiology

## 2012-06-15 DIAGNOSIS — Z96651 Presence of right artificial knee joint: Secondary | ICD-10-CM

## 2012-06-15 DIAGNOSIS — G4733 Obstructive sleep apnea (adult) (pediatric): Secondary | ICD-10-CM | POA: Diagnosis present

## 2012-06-15 DIAGNOSIS — Z87891 Personal history of nicotine dependence: Secondary | ICD-10-CM

## 2012-06-15 DIAGNOSIS — D62 Acute posthemorrhagic anemia: Secondary | ICD-10-CM | POA: Diagnosis not present

## 2012-06-15 DIAGNOSIS — Z4789 Encounter for other orthopedic aftercare: Principal | ICD-10-CM

## 2012-06-15 DIAGNOSIS — Z6829 Body mass index (BMI) 29.0-29.9, adult: Secondary | ICD-10-CM

## 2012-06-15 DIAGNOSIS — E669 Obesity, unspecified: Secondary | ICD-10-CM | POA: Diagnosis present

## 2012-06-15 DIAGNOSIS — Z96659 Presence of unspecified artificial knee joint: Secondary | ICD-10-CM

## 2012-06-15 DIAGNOSIS — Z89529 Acquired absence of unspecified knee: Secondary | ICD-10-CM

## 2012-06-15 DIAGNOSIS — D500 Iron deficiency anemia secondary to blood loss (chronic): Secondary | ICD-10-CM | POA: Diagnosis present

## 2012-06-15 HISTORY — PX: TOTAL KNEE REVISION: SHX996

## 2012-06-15 LAB — TYPE AND SCREEN
ABO/RH(D): A POS
Antibody Screen: NEGATIVE

## 2012-06-15 SURGERY — TOTAL KNEE REVISION
Anesthesia: Spinal | Site: Knee | Laterality: Right | Wound class: Clean

## 2012-06-15 MED ORDER — ASPIRIN EC 325 MG PO TBEC
325.0000 mg | DELAYED_RELEASE_TABLET | Freq: Two times a day (BID) | ORAL | Status: DC
  Administered 2012-06-16: 325 mg via ORAL
  Filled 2012-06-15 (×3): qty 1

## 2012-06-15 MED ORDER — LACTATED RINGERS IV SOLN
INTRAVENOUS | Status: DC | PRN
  Administered 2012-06-15 (×2): via INTRAVENOUS
  Administered 2012-06-15: 1000 mL

## 2012-06-15 MED ORDER — METHOCARBAMOL 100 MG/ML IJ SOLN
500.0000 mg | Freq: Four times a day (QID) | INTRAVENOUS | Status: DC | PRN
  Filled 2012-06-15: qty 5

## 2012-06-15 MED ORDER — DIPHENHYDRAMINE HCL 25 MG PO CAPS: 25.0000 mg | ORAL_CAPSULE | Freq: Four times a day (QID) | ORAL | Status: DC | PRN

## 2012-06-15 MED ORDER — METHOCARBAMOL 500 MG PO TABS
500.0000 mg | ORAL_TABLET | Freq: Four times a day (QID) | ORAL | Status: DC | PRN
  Administered 2012-06-15 – 2012-06-16 (×3): 500 mg via ORAL
  Filled 2012-06-15 (×3): qty 1

## 2012-06-15 MED ORDER — PROPOFOL 10 MG/ML IV EMUL
INTRAVENOUS | Status: DC | PRN
  Administered 2012-06-15: 75 ug/kg/min via INTRAVENOUS

## 2012-06-15 MED ORDER — BUPIVACAINE LIPOSOME 1.3 % IJ SUSP
20.0000 mL | Freq: Once | INTRAMUSCULAR | Status: DC
  Filled 2012-06-15: qty 20

## 2012-06-15 MED ORDER — ONDANSETRON HCL 4 MG/2ML IJ SOLN
INTRAMUSCULAR | Status: DC | PRN
  Administered 2012-06-15 (×2): 2 mg via INTRAVENOUS

## 2012-06-15 MED ORDER — FERROUS SULFATE 325 (65 FE) MG PO TABS
325.0000 mg | ORAL_TABLET | Freq: Three times a day (TID) | ORAL | Status: DC
  Administered 2012-06-15 – 2012-06-16 (×3): 325 mg via ORAL
  Filled 2012-06-15 (×6): qty 1

## 2012-06-15 MED ORDER — METOCLOPRAMIDE HCL 5 MG/ML IJ SOLN: 5.0000 mg | Freq: Three times a day (TID) | INTRAMUSCULAR | Status: DC | PRN

## 2012-06-15 MED ORDER — HYDROMORPHONE HCL PF 1 MG/ML IJ SOLN: 0.2500 mg | INTRAMUSCULAR | Status: DC | PRN

## 2012-06-15 MED ORDER — KETOROLAC TROMETHAMINE 30 MG/ML IJ SOLN
INTRAMUSCULAR | Status: DC | PRN
  Administered 2012-06-15: 30 mg

## 2012-06-15 MED ORDER — SODIUM CHLORIDE 0.9 % IJ SOLN
INTRAMUSCULAR | Status: DC | PRN
  Administered 2012-06-15: 50 mL

## 2012-06-15 MED ORDER — CEFAZOLIN SODIUM-DEXTROSE 2-3 GM-% IV SOLR
2.0000 g | INTRAVENOUS | Status: AC
  Administered 2012-06-15: 2 g via INTRAVENOUS

## 2012-06-15 MED ORDER — FENTANYL CITRATE 0.05 MG/ML IJ SOLN
INTRAMUSCULAR | Status: DC | PRN
  Administered 2012-06-15: 25 ug via INTRAVENOUS

## 2012-06-15 MED ORDER — SODIUM CHLORIDE 0.9 % IV SOLN
1000.0000 mg | INTRAVENOUS | Status: AC
  Administered 2012-06-15: 1000 mg via INTRAVENOUS
  Filled 2012-06-15: qty 10

## 2012-06-15 MED ORDER — ACETAMINOPHEN 10 MG/ML IV SOLN: 1000.0000 mg | Freq: Once | INTRAVENOUS | Status: DC | PRN

## 2012-06-15 MED ORDER — BISACODYL 10 MG RE SUPP: 10.0000 mg | Freq: Every day | RECTAL | Status: DC | PRN

## 2012-06-15 MED ORDER — ALUM & MAG HYDROXIDE-SIMETH 200-200-20 MG/5ML PO SUSP: 30.0000 mL | ORAL | Status: DC | PRN

## 2012-06-15 MED ORDER — CEFAZOLIN SODIUM-DEXTROSE 2-3 GM-% IV SOLR
2.0000 g | Freq: Four times a day (QID) | INTRAVENOUS | Status: AC
  Administered 2012-06-15 (×2): 2 g via INTRAVENOUS
  Filled 2012-06-15 (×2): qty 50

## 2012-06-15 MED ORDER — SODIUM CHLORIDE 0.9 % IR SOLN
Status: DC | PRN
  Administered 2012-06-15: 1000 mL

## 2012-06-15 MED ORDER — DEXAMETHASONE SODIUM PHOSPHATE 10 MG/ML IJ SOLN
INTRAMUSCULAR | Status: DC | PRN
  Administered 2012-06-15: 10 mg via INTRAVENOUS

## 2012-06-15 MED ORDER — SODIUM CHLORIDE 0.9 % IV SOLN
INTRAVENOUS | Status: DC
  Administered 2012-06-15 (×2): via INTRAVENOUS
  Filled 2012-06-15 (×4): qty 1000

## 2012-06-15 MED ORDER — FOLIC ACID 1 MG PO TABS
1.0000 mg | ORAL_TABLET | Freq: Every morning | ORAL | Status: DC
  Administered 2012-06-15 – 2012-06-16 (×2): 1 mg via ORAL
  Filled 2012-06-15 (×2): qty 1

## 2012-06-15 MED ORDER — ONDANSETRON HCL 4 MG/2ML IJ SOLN: 4.0000 mg | Freq: Four times a day (QID) | INTRAMUSCULAR | Status: DC | PRN

## 2012-06-15 MED ORDER — ZOLPIDEM TARTRATE 5 MG PO TABS
5.0000 mg | ORAL_TABLET | Freq: Every evening | ORAL | Status: DC | PRN
  Administered 2012-06-15: 5 mg via ORAL
  Filled 2012-06-15: qty 1

## 2012-06-15 MED ORDER — BUPIVACAINE-EPINEPHRINE PF 0.25-1:200000 % IJ SOLN
INTRAMUSCULAR | Status: DC | PRN
  Administered 2012-06-15: 30 mL

## 2012-06-15 MED ORDER — HYDROMORPHONE HCL PF 1 MG/ML IJ SOLN
0.5000 mg | INTRAMUSCULAR | Status: DC | PRN
  Administered 2012-06-15: 0.5 mg via INTRAVENOUS
  Filled 2012-06-15: qty 1

## 2012-06-15 MED ORDER — MEPERIDINE HCL 50 MG/ML IJ SOLN: 6.2500 mg | INTRAMUSCULAR | Status: DC | PRN

## 2012-06-15 MED ORDER — DOCUSATE SODIUM 100 MG PO CAPS
100.0000 mg | ORAL_CAPSULE | Freq: Two times a day (BID) | ORAL | Status: DC
  Administered 2012-06-15 – 2012-06-16 (×2): 100 mg via ORAL

## 2012-06-15 MED ORDER — MENTHOL 3 MG MT LOZG: 1.0000 | LOZENGE | OROMUCOSAL | Status: DC | PRN

## 2012-06-15 MED ORDER — ACETAMINOPHEN 10 MG/ML IV SOLN
INTRAVENOUS | Status: DC | PRN
  Administered 2012-06-15: 1000 mg via INTRAVENOUS

## 2012-06-15 MED ORDER — PROMETHAZINE HCL 25 MG/ML IJ SOLN: 6.2500 mg | INTRAMUSCULAR | Status: DC | PRN

## 2012-06-15 MED ORDER — FLEET ENEMA 7-19 GM/118ML RE ENEM: 1.0000 | ENEMA | Freq: Once | RECTAL | Status: AC | PRN

## 2012-06-15 MED ORDER — DEXAMETHASONE SODIUM PHOSPHATE 10 MG/ML IJ SOLN
10.0000 mg | Freq: Once | INTRAMUSCULAR | Status: DC
  Filled 2012-06-15: qty 1

## 2012-06-15 MED ORDER — HYDROCODONE-ACETAMINOPHEN 7.5-325 MG PO TABS
1.0000 | ORAL_TABLET | ORAL | Status: DC
  Administered 2012-06-15 (×4): 1 via ORAL
  Administered 2012-06-16: 2 via ORAL
  Filled 2012-06-15: qty 2
  Filled 2012-06-15 (×5): qty 1

## 2012-06-15 MED ORDER — CELECOXIB 200 MG PO CAPS
200.0000 mg | ORAL_CAPSULE | Freq: Two times a day (BID) | ORAL | Status: DC
  Administered 2012-06-15 – 2012-06-16 (×3): 200 mg via ORAL
  Filled 2012-06-15 (×4): qty 1

## 2012-06-15 MED ORDER — CHLORHEXIDINE GLUCONATE 4 % EX LIQD
60.0000 mL | Freq: Once | CUTANEOUS | Status: DC
  Filled 2012-06-15: qty 60

## 2012-06-15 MED ORDER — PHENOL 1.4 % MT LIQD: 1.0000 | OROMUCOSAL | Status: DC | PRN

## 2012-06-15 MED ORDER — BUPIVACAINE IN DEXTROSE 0.75-8.25 % IT SOLN
INTRATHECAL | Status: DC | PRN
  Administered 2012-06-15: 2 mL via INTRATHECAL

## 2012-06-15 MED ORDER — 0.9 % SODIUM CHLORIDE (POUR BTL) OPTIME
TOPICAL | Status: DC | PRN
  Administered 2012-06-15: 1000 mL

## 2012-06-15 MED ORDER — POLYETHYLENE GLYCOL 3350 17 G PO PACK
17.0000 g | PACK | Freq: Two times a day (BID) | ORAL | Status: DC
  Administered 2012-06-15 – 2012-06-16 (×3): 17 g via ORAL

## 2012-06-15 MED ORDER — EPHEDRINE SULFATE 50 MG/ML IJ SOLN
INTRAMUSCULAR | Status: DC | PRN
  Administered 2012-06-15: 10 mg via INTRAVENOUS

## 2012-06-15 MED ORDER — BUPIVACAINE LIPOSOME 1.3 % IJ SUSP
INTRAMUSCULAR | Status: DC | PRN
  Administered 2012-06-15: 20 mL

## 2012-06-15 MED ORDER — METOCLOPRAMIDE HCL 10 MG PO TABS: 5.0000 mg | ORAL_TABLET | Freq: Three times a day (TID) | ORAL | Status: DC | PRN

## 2012-06-15 MED ORDER — ONDANSETRON HCL 4 MG PO TABS: 4.0000 mg | ORAL_TABLET | Freq: Four times a day (QID) | ORAL | Status: DC | PRN

## 2012-06-15 SURGICAL SUPPLY — 86 items
ADAPTER BOLT FEMORAL +2/-2 (Knees) ×1 IMPLANT
ADH SKN CLS APL DERMABOND .7 (GAUZE/BANDAGES/DRESSINGS) ×1
ADPR FEM +2/-2 OFST BOLT (Knees) ×1 IMPLANT
ADPR FEM 5D STRL KN PFC SGM (Orthopedic Implant) ×1 IMPLANT
AUG TIB SZ4 5 REV STP WDG STRL (Knees) ×1 IMPLANT
AUG TIB SZ5 5 REV STP WDG STRL (Knees) ×1 IMPLANT
BAG SPEC THK2 15X12 ZIP CLS (MISCELLANEOUS) ×1
BAG ZIPLOCK 12X15 (MISCELLANEOUS) ×2 IMPLANT
BANDAGE ELASTIC 6 VELCRO ST LF (GAUZE/BANDAGES/DRESSINGS) ×2 IMPLANT
BANDAGE ESMARK 6X9 LF (GAUZE/BANDAGES/DRESSINGS) ×1 IMPLANT
BLADE SAW SGTL 13.0X1.19X90.0M (BLADE) ×2 IMPLANT
BLADE SAW SGTL 81X20 HD (BLADE) ×2 IMPLANT
BNDG CMPR 9X6 STRL LF SNTH (GAUZE/BANDAGES/DRESSINGS) ×1
BNDG ESMARK 6X9 LF (GAUZE/BANDAGES/DRESSINGS) ×2
BONE CEMENT GENTAMICIN (Cement) ×6 IMPLANT
BOWL SMART MIX CTS (DISPOSABLE) ×1 IMPLANT
BRUSH FEMORAL CANAL (MISCELLANEOUS) ×1 IMPLANT
CEMENT BONE GENTAMICIN 40 (Cement) ×3 IMPLANT
CEMENT RESTRICTOR DEPUY SZ 6 (Cement) ×1 IMPLANT
CLOTH BEACON ORANGE TIMEOUT ST (SAFETY) ×2 IMPLANT
CUFF TOURN SGL QUICK 34 (TOURNIQUET CUFF) ×2
CUFF TRNQT CYL 34X4X40X1 (TOURNIQUET CUFF) ×1 IMPLANT
DERMABOND ADVANCED (GAUZE/BANDAGES/DRESSINGS) ×1
DERMABOND ADVANCED .7 DNX12 (GAUZE/BANDAGES/DRESSINGS) ×1 IMPLANT
DRAPE EXTREMITY T 121X128X90 (DRAPE) ×2 IMPLANT
DRAPE POUCH INSTRU U-SHP 10X18 (DRAPES) ×2 IMPLANT
DRAPE U-SHAPE 47X51 STRL (DRAPES) ×2 IMPLANT
DRSG ADAPTIC 3X8 NADH LF (GAUZE/BANDAGES/DRESSINGS) ×1 IMPLANT
DRSG AQUACEL AG ADV 3.5X10 (GAUZE/BANDAGES/DRESSINGS) ×1 IMPLANT
DRSG AQUACEL AG ADV 3.5X14 (GAUZE/BANDAGES/DRESSINGS) ×1 IMPLANT
DRSG PAD ABDOMINAL 8X10 ST (GAUZE/BANDAGES/DRESSINGS) ×1 IMPLANT
DRSG TEGADERM 4X4.75 (GAUZE/BANDAGES/DRESSINGS) ×2 IMPLANT
DURAPREP 26ML APPLICATOR (WOUND CARE) ×2 IMPLANT
ELECT REM PT RETURN 9FT ADLT (ELECTROSURGICAL) ×2
ELECTRODE REM PT RTRN 9FT ADLT (ELECTROSURGICAL) ×1 IMPLANT
EVACUATOR 1/8 PVC DRAIN (DRAIN) ×2 IMPLANT
FACESHIELD LNG OPTICON STERILE (SAFETY) ×10 IMPLANT
FEM TC3 PFC SIGMA SZ5 (Orthopedic Implant) ×2 IMPLANT
FEMORAL ADAPTER (Orthopedic Implant) ×1 IMPLANT
FEMORAL TC3 PFC SIGMA SZ5 (Orthopedic Implant) IMPLANT
GAUZE SPONGE 2X2 8PLY STRL LF (GAUZE/BANDAGES/DRESSINGS) ×1 IMPLANT
GLOVE BIOGEL PI IND STRL 7.5 (GLOVE) ×1 IMPLANT
GLOVE BIOGEL PI IND STRL 8 (GLOVE) ×2 IMPLANT
GLOVE BIOGEL PI INDICATOR 7.5 (GLOVE) ×1
GLOVE BIOGEL PI INDICATOR 8 (GLOVE) ×2
GLOVE ECLIPSE 8.0 STRL XLNG CF (GLOVE) ×4 IMPLANT
GLOVE ORTHO TXT STRL SZ7.5 (GLOVE) ×4 IMPLANT
GOWN BRE IMP PREV XXLGXLNG (GOWN DISPOSABLE) ×5 IMPLANT
GOWN STRL NON-REIN LRG LVL3 (GOWN DISPOSABLE) ×2 IMPLANT
HANDPIECE INTERPULSE COAX TIP (DISPOSABLE) ×2
IMMOBILIZER KNEE 20 (SOFTGOODS)
IMMOBILIZER KNEE 20 THIGH 36 (SOFTGOODS) IMPLANT
INSERT TIB TC3 RP SZ5 15 (Knees) ×1 IMPLANT
KIT BASIN OR (CUSTOM PROCEDURE TRAY) ×2 IMPLANT
MANIFOLD NEPTUNE II (INSTRUMENTS) ×2 IMPLANT
NDL SAFETY ECLIPSE 18X1.5 (NEEDLE) ×1 IMPLANT
NEEDLE HYPO 18GX1.5 SHARP (NEEDLE) ×2
NS IRRIG 1000ML POUR BTL (IV SOLUTION) ×2 IMPLANT
PACK TOTAL JOINT (CUSTOM PROCEDURE TRAY) ×2 IMPLANT
PADDING CAST COTTON 6X4 STRL (CAST SUPPLIES) ×2 IMPLANT
PATELLA DOME PFC 41MM (Knees) ×1 IMPLANT
POSITIONER SURGICAL ARM (MISCELLANEOUS) ×2 IMPLANT
RESTRICTOR CEMENT SZ 5 C-STEM (Cement) ×1 IMPLANT
SET HNDPC FAN SPRY TIP SCT (DISPOSABLE) ×1 IMPLANT
SET PAD KNEE POSITIONER (MISCELLANEOUS) ×2 IMPLANT
SPONGE GAUZE 2X2 STER 10/PKG (GAUZE/BANDAGES/DRESSINGS) ×1
SPONGE GAUZE 4X4 12PLY (GAUZE/BANDAGES/DRESSINGS) ×2 IMPLANT
SPONGE LAP 18X18 X RAY DECT (DISPOSABLE) ×3 IMPLANT
STAPLER VISISTAT 35W (STAPLE) IMPLANT
STEM TIBIA PFC 13X30MM (Stem) ×2 IMPLANT
SUCTION FRAZIER 12FR DISP (SUCTIONS) ×2 IMPLANT
SUT MNCRL AB 3-0 PS2 18 (SUTURE) ×1 IMPLANT
SUT VIC AB 1 CT1 36 (SUTURE) ×6 IMPLANT
SUT VIC AB 2-0 CT1 27 (SUTURE) ×6
SUT VIC AB 2-0 CT1 TAPERPNT 27 (SUTURE) ×3 IMPLANT
SYR 50ML LL SCALE MARK (SYRINGE) ×2 IMPLANT
TOWEL OR 17X26 10 PK STRL BLUE (TOWEL DISPOSABLE) ×4 IMPLANT
TOWER CARTRIDGE SMART MIX (DISPOSABLE) ×1 IMPLANT
TRAY FOLEY CATH 14FRSI W/METER (CATHETERS) ×2 IMPLANT
TRAY SLEEVE CEM ML (Knees) ×1 IMPLANT
TRAY TIB SZ 5 REVISION (Knees) ×1 IMPLANT
TUBING CONNECTING 10 (TUBING) ×1 IMPLANT
WATER STERILE IRR 1500ML POUR (IV SOLUTION) ×3 IMPLANT
WEDGE SIZE 4 5MM (Knees) ×1 IMPLANT
WEDGE SZ 5 5MM (Knees) ×1 IMPLANT
WRAP KNEE MAXI GEL POST OP (GAUZE/BANDAGES/DRESSINGS) ×2 IMPLANT

## 2012-06-15 NOTE — Anesthesia Preprocedure Evaluation (Addendum)
Anesthesia Evaluation  Patient identified by MRN, date of birth, ID band Patient awake    Reviewed: Allergy & Precautions, H&P , NPO status , Patient's Chart, lab work & pertinent test results  Airway Mallampati: II TM Distance: >3 FB Neck ROM: full    Dental no notable dental hx. (+) Teeth Intact and Dental Advisory Given   Pulmonary shortness of breath and with exertion, sleep apnea and Continuous Positive Airway Pressure Ventilation , former smoker,  breath sounds clear to auscultation  Pulmonary exam normal       Cardiovascular Exercise Tolerance: Good negative cardio ROS  + dysrhythmias Rhythm:regular Rate:Normal     Neuro/Psych  Neuromuscular disease negative neurological ROS  negative psych ROS   GI/Hepatic negative GI ROS, Neg liver ROS,   Endo/Other  negative endocrine ROS  Renal/GU negative Renal ROS     Musculoskeletal  (+) Arthritis -, Osteoarthritis,    Abdominal   Peds  Hematology negative hematology ROS (+)   Anesthesia Other Findings   Reproductive/Obstetrics                          Anesthesia Physical  Anesthesia Plan  ASA: III  Anesthesia Plan: Spinal   Post-op Pain Management:    Induction: Intravenous  Airway Management Planned: Simple Face Mask  Additional Equipment:   Intra-op Plan:   Post-operative Plan:   Informed Consent: I have reviewed the patients History and Physical, chart, labs and discussed the procedure including the risks, benefits and alternatives for the proposed anesthesia with the patient or authorized representative who has indicated his/her understanding and acceptance.   Dental Advisory Given  Plan Discussed with: CRNA  Anesthesia Plan Comments:       Anesthesia Quick Evaluation

## 2012-06-15 NOTE — Anesthesia Postprocedure Evaluation (Signed)
Anesthesia Post Note  Patient: Chris Obrien  Procedure(s) Performed: Procedure(s) (LRB): reimplantation of right total knee  (Right)  Anesthesia type: Spinal  Patient location: PACU  Post pain: Pain level controlled  Post assessment: Post-op Vital signs reviewed  Last Vitals: BP 137/74  Pulse 88  Temp(Src) 36.4 C (Oral)  Resp 16  Ht 6\' 1"  (1.854 m)  Wt 227 lb (102.967 kg)  BMI 29.96 kg/m2  SpO2 96%  Post vital signs: Reviewed  Level of consciousness: sedated  Complications: No apparent anesthesia complications

## 2012-06-15 NOTE — Anesthesia Procedure Notes (Signed)
Spinal  Patient location during procedure: OR Start time: 06/15/2012 7:30 AM End time: 06/15/2012 7:35 AM Staffing Anesthesiologist: Lewie Loron R Performed by: anesthesiologist  Preanesthetic Checklist Completed: patient identified, site marked, surgical consent, pre-op evaluation, timeout performed, IV checked, risks and benefits discussed and monitors and equipment checked Spinal Block Patient position: sitting Prep: ChloraPrep Patient monitoring: heart rate, continuous pulse ox and blood pressure Approach: midline Location: L3-4 Injection technique: single-shot Needle Needle type: Sprotte and Pencil-Tip  Needle gauge: 25 G Needle length: 9 cm Assessment Sensory level: T8 Additional Notes Expiration date of kit checked and confirmed. Patient tolerated procedure well, without complications.

## 2012-06-15 NOTE — Brief Op Note (Signed)
06/15/2012  10:03 AM  PATIENT:  Chris Obrien  62 y.o. male  PRE-OPERATIVE DIAGNOSIS:  s/p resection right total knee for infection  POST-OPERATIVE DIAGNOSIS:  s/p resection right total knee for infection  PROCEDURE:  Procedure(s): Reimplantation/revision of right total knee  (Right)  SURGEON:  Surgeon(s) and Role:    * [name], MD - Primary  PHYSICIAN ASSISTANT: Lanney Gins, PA-C  ANESTHESIA:   spinal  EBL:  Total I/O In: 1000 [I.V.:1000] Out: 525 [Urine:525]  BLOOD ADMINISTERED:none  DRAINS: (1 medium) Hemovact drain(s) in the right knee with  Suction Open   LOCAL MEDICATIONS USED:  OTHER Exparel combination  SPECIMEN:  No Specimen  DISPOSITION OF SPECIMEN:  N/A  COUNTS:  YES  TOURNIQUET:   Total Tourniquet Time Documented: Thigh (Right) - 69 minutes Total: Thigh (Right) - 69 minutes   DICTATION: .Other Dictation: Dictation Number [id number]  PLAN OF CARE: Admit to inpatient   PATIENT DISPOSITION:  PACU - hemodynamically stable.   Delay start of Pharmacological VTE agent (>24hrs) due to surgical blood loss or risk of bleeding: no

## 2012-06-15 NOTE — Progress Notes (Signed)
Utilization review completed.  

## 2012-06-15 NOTE — Evaluation (Signed)
Physical Therapy Evaluation Patient Details Name: Chris Obrien MRN: [id number] DOB: 11-Nov-1950 Today's Date: 06/15/2012 Time: 7846-9629 PT Time Calculation (min): 23 min  PT Assessment / Plan / Recommendation Clinical Impression  62 yo male admitted 06/15/12 for reimplant of RTKA. Pt ambulated 200 ft and able to perform LR. Pt will benefit fromPT while in acute care/ Pt plans DC tomorrow,    PT Assessment  Patient needs continued PT services    Follow Up Recommendations  Home health PT    Does the patient have the potential to tolerate intense rehabilitation      Barriers to Discharge        Equipment Recommendations  None recommended by PT    Recommendations for Other Services     Frequency 7X/week    Precautions / Restrictions Precautions Precautions: Knee   Pertinent Vitals/Pain < 4 R knee.      Mobility  Bed Mobility Bed Mobility: Supine to Sit Supine to Sit: 5: Supervision Transfers Transfers: Sit to Stand;Stand to Sit Sit to Stand: 4: Min guard;From bed;With armrests;With upper extremity assist;From elevated surface Stand to Sit: 4: Min guard;To chair/3-in-1;With armrests;With upper extremity assist Details for Transfer Assistance: cues for safety standing , pt is impulsive Ambulation/Gait Ambulation/Gait Assistance: 4: Min assist Ambulation Distance (Feet): 200 Feet Assistive device: Rolling walker Ambulation/Gait Assistance Details: cues to slow pace down, Gait Pattern: Step-through pattern;Antalgic    Exercises Total Joint Exercises Quad Sets: AROM;Right;10 reps   PT Diagnosis: Difficulty walking;Acute pain  PT Problem List: Decreased strength;Decreased range of motion;Decreased activity tolerance;Decreased mobility;Decreased safety awareness;Decreased knowledge of precautions PT Treatment Interventions: DME instruction;Gait training;Functional mobility training;Therapeutic activities;Therapeutic exercise;Patient/family education   PT Goals Acute  Rehab PT Goals PT Goal Formulation: With patient Time For Goal Achievement: 06/22/12 Potential to Achieve Goals: Good Pt will go Sit to Stand: with modified independence PT Goal: Sit to Stand - Progress: Goal set today Pt will go Stand to Sit: with modified independence PT Goal: Stand to Sit - Progress: Goal set today Pt will Ambulate: >150 feet;with supervision;with rolling walker PT Goal: Ambulate - Progress: Goal set today Pt will Perform Home Exercise Program: Independently PT Goal: Perform Home Exercise Program - Progress: Goal set today  Visit Information  Last PT Received On: 06/15/12 Assistance Needed: +1    Subjective Data  Subjective: I am ready to walk Patient Stated Goal: to get this behind me   Prior Functioning  Home Living Lives With: Spouse Available Help at Discharge: Family Type of Home: House Home Access: Stairs to enter Secretary/administrator of Steps: 1 Home Layout: One level Home Adaptive Equipment: Walker - rolling;Bedside commode/3-in-1 Prior Function Level of Independence: Independent with assistive device(s) Able to Take Stairs?: Yes Vocation: Part time employment Communication Communication: No difficulties    Cognition  Cognition Arousal/Alertness: Awake/alert Behavior During Therapy: WFL for tasks assessed/performed Overall Cognitive Status: Within Functional Limits for tasks assessed    Extremity/Trunk Assessment Right Upper Extremity Assessment RUE ROM/Strength/Tone: [facility] for tasks assessed Left Upper Extremity Assessment LUE ROM/Strength/Tone: WFL for tasks assessed Right Lower Extremity Assessment RLE ROM/Strength/Tone: Deficits RLE ROM/Strength/Tone Deficits: able to perform SLR RLE Sensation: WFL - Light Touch Left Lower Extremity Assessment LLE ROM/Strength/Tone: WFL for tasks assessed LLE Sensation: WFL - Light Touch Trunk Assessment Trunk Assessment: Normal   Balance    End of Session PT - End of Session Equipment Utilized  During Treatment: Gait belt Activity Tolerance: Patient tolerated treatment well Patient left: in chair;with call bell/phone within reach  Nurse Communication: Mobility status  GP     Chris Obrien 06/15/2012, 4:29 PM Chris Obrien PT [phone]

## 2012-06-15 NOTE — Interval H&P Note (Signed)
History and Physical Interval Note:  06/15/2012 7:20 AM  Chris Obrien  has presented today for surgery, with the diagnosis of s/p resection right total knee   The various methods of treatment have been discussed with the patient and family. After consideration of risks, benefits and other options for treatment, the patient has consented to  Procedure(s): reimplantation of right total knee  (Right) as a surgical intervention .  The patient's history has been reviewed, patient examined, no change in status, stable for surgery.  I have reviewed the patient's chart and labs.  Questions were answered to the patient's satisfaction.     Chris Obrien

## 2012-06-15 NOTE — Transfer of Care (Signed)
Immediate Anesthesia Transfer of Care Note  Patient: Chris Obrien  Procedure(s) Performed: Procedure(s): reimplantation of right total knee  (Right)  Patient Location: PACU  Anesthesia Type:Spinal  Level of Consciousness: awake, alert , oriented and patient cooperative  Airway & Oxygen Therapy: Patient Spontanous Breathing and Patient connected to face mask oxygen  Post-op Assessment: Report given to PACU RN and Post -op Vital signs reviewed and stable  Post vital signs: stable  Complications: No apparent anesthesia complications

## 2012-06-16 ENCOUNTER — Encounter (HOSPITAL_COMMUNITY): Payer: Self-pay | Admitting: Orthopedic Surgery

## 2012-06-16 LAB — BASIC METABOLIC PANEL
BUN: 18 mg/dL (ref 6–23)
CO2: 24 mEq/L (ref 19–32)
Calcium: 8.3 mg/dL — ABNORMAL LOW (ref 8.4–10.5)
Chloride: 104 mEq/L (ref 96–112)
Creatinine, Ser: 0.89 mg/dL (ref 0.50–1.35)
GFR calc Af Amer: >90 mL/min (ref >90)
GFR calc non Af Amer: 89 mL/min — ABNORMAL LOW (ref >90)
Glucose, Bld: 121 mg/dL — ABNORMAL HIGH (ref 70–99)
Potassium: 4.1 mEq/L (ref 3.5–5.1)
Sodium: 135 mEq/L (ref 135–145)

## 2012-06-16 LAB — CBC
HCT: 30.2 % — ABNORMAL LOW (ref 39.0–52.0)
Hemoglobin: 10.3 g/dL — ABNORMAL LOW (ref 13.0–17.0)
MCH: 28.6 pg (ref 26.0–34.0)
MCHC: 34.1 g/dL (ref 30.0–36.0)
MCV: 83.9 fL (ref 78.0–100.0)
Platelets: 256 10*3/uL (ref 150–400)
RBC: 3.6 MIL/uL — ABNORMAL LOW (ref 4.22–5.81)
RDW: 14.9 % (ref 11.5–15.5)
WBC: 9.3 10*3/uL (ref 4.0–10.5)

## 2012-06-16 MED ORDER — DSS 100 MG PO CAPS: 100.0000 mg | ORAL_CAPSULE | Freq: Two times a day (BID) | ORAL | Status: DC

## 2012-06-16 MED ORDER — HYDROCODONE-ACETAMINOPHEN 7.5-325 MG PO TABS: 1.0000 | ORAL_TABLET | ORAL | Status: DC | PRN

## 2012-06-16 MED ORDER — TIZANIDINE HCL 4 MG PO TABS: 4.0000 mg | ORAL_TABLET | Freq: Three times a day (TID) | ORAL | Status: DC | PRN

## 2012-06-16 MED ORDER — ASPIRIN 325 MG PO TBEC: 325.0000 mg | DELAYED_RELEASE_TABLET | Freq: Two times a day (BID) | ORAL | Status: DC

## 2012-06-16 MED ORDER — POLYETHYLENE GLYCOL 3350 17 G PO PACK: 17.0000 g | PACK | Freq: Two times a day (BID) | ORAL | Status: DC

## 2012-06-16 MED ORDER — FERROUS SULFATE 325 (65 FE) MG PO TABS: 325.0000 mg | ORAL_TABLET | Freq: Three times a day (TID) | ORAL | Status: DC

## 2012-06-16 NOTE — Progress Notes (Signed)
Received orders for rw and commode.  Patient has both at home.  No DME needs at this time.

## 2012-06-16 NOTE — Progress Notes (Signed)
Physical Therapy Treatment Patient Details Name: Chris Obrien MRN: [id number] DOB: 06-10-1950 Today's Date: 06/16/2012 Time: 4098-1191 PT Time Calculation (min): 25 min  PT Assessment / Plan / Recommendation Comments on Treatment Session  Pt is improving in ROM and strength. Pt stated DR .Charlann Boxer stated to not flex >120 degrees. Demonstrated to pt that max degree. Pt plans DC this PM after PT.    Follow Up Recommendations  Home health PT     Does the patient have the potential to tolerate intense rehabilitation     Barriers to Discharge        Equipment Recommendations  None recommended by PT    Recommendations for Other Services    Frequency 7X/week   Plan Discharge plan remains appropriate    Precautions / Restrictions Precautions Precautions: Knee Precaution Comments: caution to not walk without RW Restrictions Weight Bearing Restrictions: No   Pertinent Vitals/Pain 2 pain, requested meds, RN notified,    Mobility  Bed Mobility Supine to Sit: 6: Modified independent (Device/Increase time) Transfers Sit to Stand: 5: Supervision;From bed;With upper extremity assist Stand to Sit: To chair/3-in-1;5: Supervision;With upper extremity assist Details for Transfer Assistance: cues for safety standing , pt is impulsive Ambulation/Gait Ambulation/Gait Assistance: 5: Supervision Ambulation Distance (Feet): 400 Feet Assistive device: Rolling walker Ambulation/Gait Assistance Details: cues to slow pace Gait Pattern: Step-through pattern;Antalgic    Exercises Total Joint Exercises Ankle Circles/Pumps: AROM;10 reps;Right Quad Sets: AROM;Right;10 reps Short Arc Quad: AROM;Right;10 reps Heel Slides: AROM;Right;10 reps Hip ABduction/ADduction: AROM;Right;10 reps Straight Leg Raises: AROM;Right;10 reps   PT Diagnosis:    PT Problem List:   PT Treatment Interventions:     PT Goals Acute Rehab PT Goals Pt will go Sit to Stand: with supervision PT Goal: Sit to Stand -  Progress: Met Pt will go Stand to Sit: with supervision PT Goal: Stand to Sit - Progress: Met Pt will Ambulate: >150 feet;with supervision;with rolling walker PT Goal: Ambulate - Progress: Progressing toward goal Pt will Perform Home Exercise Program: Independently PT Goal: Perform Home Exercise Program - Progress: Met  Visit Information  Last PT Received On: 06/16/12 Assistance Needed: +1    Subjective Data  Subjective: I am ready. It feels great.   Cognition  Cognition Arousal/Alertness: Awake/alert    Balance     End of Session PT - End of Session Activity Tolerance: Patient tolerated treatment well Patient left: in chair;with call bell/phone within reach Nurse Communication: Mobility status   GP     [name] 06/16/2012, 9:03 AM

## 2012-06-16 NOTE — Care Management Note (Signed)
    Page 1 of 1   06/16/2012     1:56:52 PM   CARE MANAGEMENT NOTE 06/16/2012  Patient:  Chris Obrien, Chris Obrien   Account Number:  [id number]  Date Initiated:  06/16/2012  Documentation initiated by:  Colleen Can  Subjective/Objective Assessment:   DX S/P RESECTION RT TOTAL KNEE; RE-IMPLANTATION KNEE     Action/Plan:   CM spoke with patient. Plans are for him to return to his home in [place]  where spouse will be caregiver. He already has DME. Genev[facility]lander will provide HHpt services with start date of tomorrow.   Anticipated DC Date:  06/16/2012   Anticipated DC Plan:  HOME W HOME HEALTH SERVICES      DC Planning Services  CM consult      [facility] Choice  HOME HEALTH   Choice offered to / List presented to:  C-1 Patient        HH arranged  HH-2 PT      [facility] agency  [facility]   Status of service:  Completed, signed off Medicare Important Message given?   (If response is "NO", the following Medicare IM given date fields will be blank) Date Medicare IM given:   Date Additional Medicare IM given:    Discharge Disposition:  HOME W HOME HEALTH SERVICES  Per UR Regulation:  Reviewed for med. necessity/level of care/duration of stay  If discussed at Long Length of Stay Meetings, dates discussed:    Comments:

## 2012-06-16 NOTE — Op Note (Signed)
NAMENEMESIO, CASTRILLON NO.:  [id number]  MEDICAL RECORD NO.:  [id number]  LOCATION:  1602                         FACILITY:  [facility]  PHYSICIAN:  Madlyn Frankel. Charlann Boxer, M.D.  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  PROCEDURE:  Reimplantation/revision of right total knee replacement.  COMPONENTS USED:  DePuy rotating platform posterior stabilized TC3 knee system with a size 5 TC3 femur, +2 adapter 5-degree bolt with a 13 x 30 cemented stem, a size 5 MBT revision tray with a 13 x 30 cemented stem, a 29 cemented sleeve, 5 mm medial and lateral augments with the medial augment being size 4 augment.  I used a 15 mm TC3 insert and a 41 patellar button.  SURGEON:  Madlyn Frankel. Charlann Boxer, M.D.  ASSISTANT:  Lanney Gins, PA-C.  Note that Mr. [name] was present for the entirety of the case from preoperative positioning, perioperative management of the operative extremity to primary wound closure, general facilitation of the case.  ANESTHESIA:  Spinal.  SPECIMENS:  None.  COMPLICATIONS:  None.  TOURNIQUET TIME:  68 minutes at 250 mmHg.  DRAINS:  One medium Hemovac.  INDICATIONS FOR PROCEDURE:  Mr. Schlarb is a very pleasant 62 year old gentleman with a history of index right total knee replacement.  He unfortunately caught an infection in his knee after a severe reaction to poison ivy systemically.  He underwent a resection arthroplasty for definitive 2-stage treatment of his knee approximately 8 weeks ago.  He had finished a course of IV antibiotics and was definitely ready to proceed with reimplantation given the year he had, had.  Risks and benefits, recurrent infection, need for future revision  surgery, complications due to revisions were all reviewed in addition to standard medical comorbidities.  Consent was obtained for benefit of pain relief.  PROCEDURE IN DETAIL:  The patient was brought to the operative theater. Once adequate anesthesia, preoperative antibiotics, Ancef 2 g administered, he was positioned supine.  Right thigh tourniquet was placed.  Right lower extremity was then prepped and draped in usual sterile fashion.  The right foot was placed in the [facility] leg holder. Time-out was performed identifying the patient, planned procedure, and extremity.  Leg was exsanguinated, tourniquet elevated to 250 mmHg.  The patient's old incision was incised.  Soft tissue planes were recreated.  Median arthrotomy was made encountering a large seroma expected for his activity level.  No signs of infection.  Following initial exposure and debridement of synovitic tissue, the cement spacers were removed from the femoral tibial surfaces.  At this point, we hand reamed up to 15 mm on both the femoral and tibial side for potential use of stems.  Attention was first directed to the tibia side using extramedullary guide.  The physician did fit for 2 degrees of posterior slope parallel to the shaft of the tibia and resected just a little bit of bone off the proximal tibia to freshen  up this cut.  I then sized the cut surface to be best fit with a size 5.  We then drilled the proximal femur, broached with a 29 broach and then placed a trial component.  This was positioned to utilize as a reference for femoral rotation.  At this point, the intramedullary rod was placed into the femur.  The distal cutting block was positioned over top of this and minimal bone was needed to remove only on the medial side of the femur, but no augments required.  Bone was basically intact.  At this point, the size 5 cutting block was positioned and placed onto the tibial tray to set rotation for the  femur.  It was pinned into position.  Anterior, posterior, and chamfer cuts, revisited.  The box cut was made for TC3 box removing just a little bit more bone than what had been removed before.  Trial closed reduction now carried out with the 5 femur, 5 tibia, with the trays in place and a 12.5 insert.  Based on this trial reduction, we made an incision for the final implant as noted.  I felt that I would at least add 5 mm augments to the medial and lateral aspects of the tibia and then I would try and get away with either a 10 or 12.5 insert initially.  At this point, trial components were removed, we irrigated the canals out with a canal brush irrigator with about a L of normal saline solution.  I then sized and fit cement restriction into the distal femur proximal tibia.  We then irrigated out the entire knee wound with approximately 2 L of normal saline solution.  While this was being done, the final components were opened and configured on the back table under my direct supervision.  Cement was then mixed.  We used 3 batches of gentamicin impregnated high viscosity cement.  The components were then cemented into position.  The knee was brought to extension with a 12.5 insert and extruded cement was removed.  Once the cement fully cured, I decided to go up to the 15 mm insert to provide more stability to this man knowing that he has been very active and would want to have as much stability to the knee as possible.  I selected a 15 TC3 femur to provide support to the knee for longevity purposes.  The final 15 post TC3 insert was opened and placed into the tibial tray. The knee was re-irrigated as the Hemovac drain was placed.  Tourniquet was let down after 6-8 minutes.  No significant hemostasis required. The extensor mechanism was then reapproximated using #1 Vicryl. Following this, I injected into the capsular tissues a combination of Exparel 20 mL, 25 mL of Marcaine, Toradol  in saline.  The remainder of the wound was closed with 2-0 Vicryl and a running 3-0 Monocryl.  The knee was then cleaned, dried, dressed sterilely using Dermabond and Aquacel dressing.  Drain site dressed separately.  He was then brought to the recovery room in stable condition tolerating the procedure well.     Madlyn Frankel Charlann Boxer, M.D.     MDO/MEDQ  D:  06/15/2012  T:  06/16/2012  Job:  [id number]

## 2012-06-16 NOTE — Progress Notes (Signed)
   Subjective: 1 Day Post-Op Procedure(s) (LRB): reimplantation of right total knee  (Right)   Patient reports pain as mild, pain well controlled. No events throughout the night. Ready to be discharged home if does well with PT.  Objective:   VITALS:   Filed Vitals:   06/16/12 0545  BP: 125/74  Pulse: 84  Temp: 97.2 F (36.2 C)  Resp: 16    Neurovascular intact Dorsiflexion/Plantar flexion intact Incision: dressing C/D/I No cellulitis present Compartment soft  LABS  Recent Labs  06/16/12 0413  HGB 10.3*  HCT 30.2*  WBC 9.3  PLT 256     Recent Labs  06/16/12 0413  NA 135  K 4.1  BUN 18  CREATININE 0.89  GLUCOSE 121*     Assessment/Plan: 1 Day Post-Op Procedure(s) (LRB): reimplantation of right total knee  (Right) HV drain d/c'ed Foley cath d/c'ed Advance diet Up with therapy D/C IV fluids Discharge home with home health Follow up in 2 weeks at [facility]. Follow up with OLIN,Falisha Osment D in 2 weeks.  Contact information:  [facility] [address] [place] [address] [phone]    Expected ABLA  Treated with iron and will observe  Obese  Estimated body mass index is 29.96 kg/(m^2) as calculated from the following:   Height as of this encounter: 6\' 1"  (1.854 m).   Weight as of this encounter: 102.967 kg (227 lb). Patient also counseled that weight may inhibit the healing process Patient counseled that losing weight will help with future health issues        Anastasio Auerbach. Advika Mclelland   PAC  06/16/2012, 7:32 AM

## 2012-06-16 NOTE — Progress Notes (Addendum)
Physical Therapy Treatment Patient Details Name: Chris Obrien MRN: [id number] DOB: March 10, 1950 Today's Date: 06/16/2012 Time: 6962-9528 PT Time Calculation (min): 11 min  PT Assessment / Plan / Recommendation Comments on Treatment Session  pt is ready for DC. has achieved goals. Pt verbally reviewed going up 1 step.pt has had a spacer and KI x 2 months and feels confident.    Follow Up Recommendations  Home health PT     Does the patient have the potential to tolerate intense rehabilitation     Barriers to Discharge        Equipment Recommendations  None recommended by PT    Recommendations for Other Services    Frequency 7X/week   Plan Discharge plan remains appropriate    Precautions / Restrictions Precautions Precautions: Knee Precaution Comments: caution to not walk without RW Restrictions Weight Bearing Restrictions: No   Pertinent Vitals/Pain 2 R knee    Mobility  Bed Mobility Supine to Sit: 6: Modified independent (Device/Increase time) Transfers Sit to Stand: 6: Modified independent (Device/Increase time);From chair/3-in-1 Stand to Sit: To chair/3-in-1;6: Modified independent (Device/Increase time) Details for Transfer Assistance: cues for safety standing , pt is impulsive Ambulation/Gait Ambulation/Gait Assistance: 6: Modified independent (Device/Increase time) Ambulation Distance (Feet): 400 Feet Assistive device: Rolling walker Ambulation/Gait Assistance Details: cues to slow pace Gait Pattern: Step-through pattern;Antalgic    Exercises Total Joint Exercises Ankle Circles/Pumps: AROM;10 reps;Right Quad Sets: AROM;Right;10 reps Short Arc Quad: AROM;Right;10 reps Heel Slides: AROM;Right;10 reps Hip ABduction/ADduction: AROM;Right;10 reps Straight Leg Raises: AROM;Right;10 reps Knee flexion 10-55   PT Diagnosis:    PT Problem List:   PT Treatment Interventions:     PT Goals Acute Rehab PT Goals Pt will go Sit to Stand: with supervision PT Goal:  Sit to Stand - Progress: Met Pt will go Stand to Sit: with supervision PT Goal: Stand to Sit - Progress: Met Pt will Ambulate: >150 feet;with supervision;with rolling walker PT Goal: Ambulate - Progress: Met Pt will Perform Home Exercise Program: Independently PT Goal: Perform Home Exercise Program - Progress: Met  Visit Information  Last PT Received On: 06/16/12 Assistance Needed: +1    Subjective Data  Subjective: i am ready to get out of here.   Cognition  Cognition Arousal/Alertness: Awake/alert    Balance     End of Session PT - End of Session Activity Tolerance: Patient tolerated treatment well Patient left: in chair;with call bell/phone within reach Nurse Communication: Mobility status (ready for DC)   GP     [name] 06/16/2012, 11:24 AM

## 2012-06-24 NOTE — Discharge Summary (Signed)
Physician Discharge Summary  Patient ID: Chris Obrien MRN: [id number] DOB/AGE: 1950/10/02 62 y.o.  Admit date: 06/15/2012 Discharge date: 06/16/2012   Procedures:  Procedure(s) (LRB): reimplantation of right total knee  (Right)  Attending Physician:  Dr. Durene Romans   Admission Diagnoses:   Right knee pain antibiotic spacer  Discharge Diagnoses:  Principal Problem:   S/P right TK revision Active Problems:   Expected blood loss anemia  Past Medical History  Diagnosis Date  . Benign neoplasm of colon   . Insomnia, unspecified   . Oligospermia   . Swelling of joint of right knee   . Right knee skin infection   . BPH (benign prostatic hypertrophy)     hx small stream  . Arthritis   . Acute reactive arthritis per pcp note from 03-30-2012    right pre-patella---  secondary contact dermitis with poison ivy exposure 03-26-2012  . OSA on CPAP     PT LOST  WEIGHT - NO LONGER USING CPAP  . Pain     RIGHT SHOULDER DELTOID MUSCLE ATROPHY - LOSS OF STRENGT VERY LIMITED RANGE OF MOTTION    HPI:   Chris Obrien, 62 y.o. male, to have a reimplantation of his right total knee arthroplasty after removing an antibiotic spacer. He had the TKA removed on 04/27/2012 with a formed antibiotic place placed do to a knee infection. He originally had a right total knee arthroplasty on 01/28/2012 after a history of pain and functional disability in the right knee due to arthritis and has failed non-surgical conservative treatments for greater than 12 weeks to includeNSAID's and/or analgesics, use of assistive devices and activity modification. Onset of symptoms was gradual, starting >10 years ago with gradually worsening course since that time. The patient noted prior procedures on the knee to include arthroscopy x3, last being in August of 2012, on the right knee(s).  Eight weeks after the knee replacement he was helping clear some land and had a contact dermatitis reaction to poison ivy. Attempts  were made to treat this with oral antibiotics but he had persistent prepatellar swelling. He then underwent a I&D of the prepatellar bursa area on 04/02/2012, with treatment of IV antibiotics. The attempts did not work and the TKA prosthesis was removed.  He has been to the clinic recently with and with signs of resolved infection of the knee he would like to proceed with a reimplantation of the TKA. Various options are discussed with the patient. Risks, benefits and expectations were discussed with the patient. Patient understand the risks, benefits and expectations and wishes to proceed with surgery.  PCP: Sonda Primes, MD   Discharged Condition: good  Hospital Course:  Patient underwent the above stated procedure on 06/15/2012. Patient tolerated the procedure well and brought to the recovery room in good condition and subsequently to the floor.  POD #1 BP: 125/74 ; Pulse: 84 ; Temp: 97.2 F (36.2 C) ; Resp: 16  Pt's foley was removed, as well as the hemovac drain removed. IV was changed to a saline lock. Patient reports pain as mild, pain well controlled. No events throughout the night. Ready to be discharged home. Neurovascular intact, dorsiflexion/plantar flexion intact, incision: dressing C/D/I, no cellulitis present and compartment soft.   LABS  Basename  06/16/12    0413   HGB  10.3  HCT  30.2    Discharge Exam: General appearance: alert, cooperative and no distress Extremities: Homans sign is negative, no sign of DVT, no edema, redness  or tenderness in the calves or thighs and no ulcers, gangrene or trophic changes  Disposition:   Home-Health Care Svc with follow up in 2 weeks   Follow-up Information   Follow up with [name], MD. Schedule an appointment as soon as possible for a visit in 2 weeks.   Contact information:   [address] [place] [address] [phone]       Discharge Orders   Future Orders Complete By Expires     Call MD / Call 911   As directed     Comments:      If you experience chest pain or shortness of breath, CALL 911 and be transported to the hospital emergency room.  If you develope a fever above 101 F, pus (white drainage) or increased drainage or redness at the wound, or calf pain, call your surgeon's office.    Change dressing  As directed     Comments:      Maintain surgical dressing for 10-14 days, then change the dressing daily with sterile 4 x 4 inch gauze dressing and tape. Keep the area dry and clean.    Constipation Prevention  As directed     Comments:      Drink plenty of fluids.  Prune juice may be helpful.  You may use a stool softener, such as Colace (over the counter) 100 mg twice a day.  Use MiraLax (over the counter) for constipation as needed.    Diet - low sodium heart healthy  As directed     Discharge instructions  As directed     Comments:      Maintain surgical dressing for 10-14 days, then replace with gauze and tape. Keep the area dry and clean until follow up. Follow up in 2 weeks at [facility]. Call with any questions or concerns.    Increase activity slowly as tolerated  As directed     TED hose  As directed     Comments:      Use stockings (TED hose) for 2 weeks on both leg(s).  You may remove them at night for sleeping.    Weight bearing as tolerated  As directed          Medication List    STOP taking these medications       acetaminophen 500 MG tablet  Commonly known as:  TYLENOL     diclofenac 75 MG EC tablet  Commonly known as:  VOLTAREN      TAKE these medications       aspirin 325 MG EC tablet  Take 1 tablet (325 mg total) by mouth 2 (two) times daily.     DSS 100 MG Caps  Take 100 mg by mouth 2 (two) times daily.     ferrous sulfate 325 (65 FE) MG tablet  Take 1 tablet (325 mg total) by mouth 3 (three) times daily after meals.     folic acid 1 MG tablet  Commonly known as:  FOLVITE  Take 1 mg by mouth every morning.      HYDROcodone-acetaminophen 7.5-325 MG per tablet  Commonly known as:  NORCO  Take 1-2 tablets by mouth every 4 (four) hours as needed for pain.     multivitamin with minerals Tabs  Take 1 tablet by mouth every morning.     orlistat 60 MG capsule  Commonly known as:  ALLI  Take 60 mg by mouth daily before breakfast.     polyethylene glycol  packet  Commonly known as:  MIRALAX / GLYCOLAX  Take 17 g by mouth 2 (two) times daily.     tiZANidine 4 MG tablet  Commonly known as:  ZANAFLEX  Take 1 tablet (4 mg total) by mouth every 8 (eight) hours as needed (For spasms.).     zolpidem 5 MG tablet  Commonly known as:  AMBIEN  Take 5 mg by mouth at bedtime as needed for sleep.         Signed: Anastasio Auerbach. Ellese Julius   PAC  06/24/2012, 8:36 AM

## 2012-08-31 ENCOUNTER — Ambulatory Visit (INDEPENDENT_AMBULATORY_CARE_PROVIDER_SITE_OTHER): Payer: PRIVATE HEALTH INSURANCE | Admitting: Neurology

## 2012-08-31 ENCOUNTER — Encounter: Payer: Self-pay | Admitting: Neurology

## 2012-08-31 VITALS — BP 156/80 | HR 80 | Ht 73.25 in | Wt 238.0 lb

## 2012-08-31 DIAGNOSIS — M5412 Radiculopathy, cervical region: Secondary | ICD-10-CM

## 2012-08-31 NOTE — Progress Notes (Signed)
Reason for visit: Arm weakness  Chris Obrien is a 62 y.o. male  History of present illness:  Chris Obrien is a 62 year old left-handed white male with a history of knee surgery on the right that was complicated by an infection. The patient underwent IV antibiotic therapy with vancomycin. In April 2014, the patient developed a severe reaction to the vancomycin with "red man syndrome". The patient around that time noted some slight numb sensations down the right arm. The patient developed severe weakness with the shoulder girdle muscles, and he was sent for an evaluation through Dr. Samara Deist from neurosurgery. MRI evaluation of the cervical spine was done, and the patient was told this was unremarkable, and that surgery was not indicated. The patient has had MRI evaluation of the shoulder that shows a partial rotator cuff tear. The patient has numbness on the deltoid area and upper arm on the right. The patient denies any pain in the neck or shoulder or down arm. The patient has no weakness of the distal portions of the arm. The patient denies any problems with the left arm or with either of the legs. The patient denies any balance issues or problems controlling the bowels or the bladder. The patient comes to this office for an evaluation.  Past Medical History  Diagnosis Date  . Benign neoplasm of colon   . Insomnia, unspecified   . Oligospermia   . Swelling of joint of right knee   . Right knee skin infection   . BPH (benign prostatic hypertrophy)     hx small stream  . Arthritis   . Acute reactive arthritis per pcp note from 03-30-2012    right pre-patella---  secondary contact dermitis with poison ivy exposure 03-26-2012  . OSA on CPAP     PT LOST  WEIGHT - NO LONGER USING CPAP  . Pain     RIGHT SHOULDER DELTOID MUSCLE ATROPHY - LOSS OF STRENGT VERY LIMITED RANGE OF MOTTION    Past Surgical History  Procedure Laterality Date  . Knee arthroscopy Right aug 2012  . Ulnar nerve repair  Right 2008  . 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 Irrigation and debridement knee Right 04/02/2012    Procedure: IRRIGATION AND DEBRIDEMENT RIGHT PRE-PATELLA  ;  Surgeon: [name], MD;  Location: [facility];  Service: Orthopedics;  Laterality: Right;  . Excisional total knee arthroplasty with antibiotic spacers Right 04/27/2012    Procedure: EXCISIONAL TOTAL KNEE ARTHROPLASTY WITH ANTIBIOTIC SPACERS;  Surgeon: [name], MD;  Location: [facility];  Service: Orthopedics;  Laterality: Right;  SAME DAY LABS  . Total knee revision Right 06/15/2012    Procedure: reimplantation of right total knee ;  Surgeon: [name], MD;  Location: [facility];  Service: Orthopedics;  Laterality: Right;  . Tonsillectomy      Family History  Problem Relation Age of Onset  . Hyperlipidemia    . Alzheimer's disease Mother   . Arthritis Father   . Diabetes Brother     Social history:  reports that he quit smoking about 4 years ago. His smoking use included Cigars. He has never used smokeless tobacco. He reports that  drinks alcohol. He reports that he does not use illicit drugs.  Medications:  Current Outpatient Prescriptions on File Prior to Visit  Medication Sig Dispense Refill  .  aspirin EC 325 MG EC tablet Take 1 tablet (325 mg total) by mouth 2 (two) times daily.  30 tablet  0  . folic acid (FOLVITE) 1 MG tablet Take 1 mg by mouth every morning.       . Multiple Vitamin (MULTIVITAMIN WITH MINERALS) TABS Take 1 tablet by mouth every morning.       . zolpidem (AMBIEN) 5 MG tablet Take 5 mg by mouth at bedtime as needed for sleep.       No current facility-administered medications on file prior to visit.      Allergies  Allergen Reactions  . Vancomycin Rash    Severe redness all over body with swollen ankles and hands  . Oxycodone Rash     ROS:  Out of a complete 14 system review of symptoms, the patient complains only of the following symptoms, and all other reviewed systems are negative.  Arm weakness, numbness  Blood pressure 156/80, pulse 80, height 6' 1.25" (1.861 m), weight 238 lb (107.956 kg).  Physical Exam  General: The patient is alert and cooperative at the time of the examination.  Head: Pupils are equal, round, and reactive to light. Discs are flat bilaterally.  Neck: The neck is supple, no carotid bruits are noted.  Respiratory: The respiratory examination is clear.  Cardiovascular: The cardiovascular examination reveals a regular rate and rhythm, no obvious murmurs or rubs are noted.  Skin: Extremities are without significant edema.  Neurologic Exam  Mental status:  Cranial nerves: Facial symmetry is present. There is good sensation of the face to pinprick and soft touch bilaterally. The strength of the facial muscles and the muscles to head turning and shoulder shrug are normal bilaterally. Speech is well enunciated, no aphasia or dysarthria is noted. Extraocular movements are full. Visual fields are full.  Motor: The motor testing reveals 5 over 5 strength of all 4 extremities, with exception that there is some weakness with supination of the right arm, external rotation, mild biceps weakness, and severe deltoid weakness of the right arm. Good symmetric motor tone is noted throughout.  Sensory: Sensory testing is intact to pinprick, soft touch, vibration sensation, and position sense on all 4 extremities, with exception that there is an area of decreased pinprick sensation on the deltoid area of the right arm. No evidence of extinction is noted.  Coordination: Cerebellar testing reveals good finger-nose-finger and heel-to-shin bilaterally.  Gait and station: Gait is normal. Tandem gait is normal. Romberg is negative. No drift is seen.  Reflexes: Deep tendon reflexes are symmetric and normal  bilaterally. Toes are downgoing bilaterally.   Assessment/Plan:  1. Right C5 radiculopathy  Clinical examination is most consistent with a severe right C5 radiculopathy. The sensory distribution of sensory loss, and the weakness distribution of the right arm are consistent with a C5 radiculopathy. An upper cord brachial plexopathy needs to be considered. The patient will be set up for nerve conduction studies of both arms, and EMG evaluation of the right arm. If this study confirms a cervical radiculopathy, the patient will be set up for a cervical myelogram with CT to follow. Otherwise, a brachial plexus MRI may be indicated. The patient is not having any pain at this point.  Marlan Palau MD 08/31/2012 6:57 PM  [facility] [address] [place], [place] [address]  Phone [phone] Fax [phone]

## 2012-09-15 ENCOUNTER — Encounter (INDEPENDENT_AMBULATORY_CARE_PROVIDER_SITE_OTHER): Payer: Self-pay

## 2012-09-15 ENCOUNTER — Ambulatory Visit (INDEPENDENT_AMBULATORY_CARE_PROVIDER_SITE_OTHER): Payer: PRIVATE HEALTH INSURANCE | Admitting: Neurology

## 2012-09-15 DIAGNOSIS — M5412 Radiculopathy, cervical region: Secondary | ICD-10-CM

## 2012-09-15 NOTE — Progress Notes (Signed)
Mr. Snyders comes in today for EMG and nerve conduction studies. The studies do not show evidence of a C5 radiculopathy. There appears to be severe, end-stage denervation in some of the shoulder girdle muscles including the supraspinatus and deltoid muscles. These muscles are innervated through the upper trunk of the brachial plexus, but other muscles similarly innervated are not affected. At this point, conservative management is indicated. The patient may not regain full recovery, but he does have the possibility of getting some function back. The patient will followup in 3 or 4 months.

## 2012-09-15 NOTE — Procedures (Signed)
  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 The patient has developed severe weakness of the deltoid muscle, and the muscles with external rotation of the shoulder. MRI of the cervical spine did not show definite nerve root compression. The patient is being evaluated for the right shoulder weakness.  NERVE CONDUCTION STUDIES:  Nerve conduction studies were performed on right upper extremity. The distal motor latencies and motor amplitudes for the median, radial, and ulnar nerves were within normal limits. The F wave latencies and nerve conduction velocities for these nerves were also normal. The sensory latencies for the median, radial, and ulnar nerves were normal.   EMG STUDIES:  EMG study was performed on the right upper extremity:  The first dorsal interosseous muscle reveals 2 to 5 K units with decreased recruitment. No fibrillations or positive waves were noted. The abductor pollicis brevis muscle reveals 2 to 4 K units with full recruitment. No fibrillations or positive waves were noted. The extensor indicis proprius muscle reveals 1 to 3 K units with full recruitment. No fibrillations or positive waves were noted. The brachioradialis muscle reveals 2 to 4K units with full recruitment. No fibrillations or positive waves were seen. The pronator teres muscle reveals 2 to 4 K units with decreased recruitment. No fibrillations or positive waves were noted. The biceps muscle reveals 1 to 3 K units with full recruitment. No fibrillations or positive waves were noted. The triceps muscle reveals 2 to 4 K units with full recruitment. No fibrillations or positive waves were noted. The anterior deltoid muscle reveals no voluntary motor units with no recruitment. 2+ fibrillations or positive waves were noted. The supraspinatus muscle reveals no voluntary motor units with no  recruitment. 2+ fibrillations and positive waves were seen. The cervical paraspinal muscles were tested at 2 levels. No abnormalities of insertional activity were seen at either level tested. There was good relaxation.   IMPRESSION:  Nerve conduction studies done on the right upper extremity were unremarkable, without evidence of a neuropathy. EMG evaluation of the right upper extremity does not show findings consistent with a C5 radiculopathy. There is isolated severe end-stage denervation of the right deltoid muscle and right supraspinatus muscle. Other C5 innervated muscles such as the biceps and the brachioradialis muscle are completely uninvolved. The possibility of a partial upper trunk lesion, or multiple mononeuropathies involving the shoulder girdle muscles, possibly vasculitic, should be considered. 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 Denervation seen in the shoulder girdle muscles appears to be quite severe.  Marlan Palau MD 09/15/2012 1:59 PM  [facility] [address] [place], [place] [address]  Phone [phone] Fax [phone]

## 2012-11-12 ENCOUNTER — Other Ambulatory Visit: Payer: Self-pay

## 2012-12-07 ENCOUNTER — Ambulatory Visit (INDEPENDENT_AMBULATORY_CARE_PROVIDER_SITE_OTHER): Payer: PRIVATE HEALTH INSURANCE | Admitting: Internal Medicine

## 2012-12-07 ENCOUNTER — Encounter: Payer: Self-pay | Admitting: Internal Medicine

## 2012-12-07 VITALS — BP 118/60 | HR 80 | Temp 98.5°F | Resp 16 | Ht 73.0 in | Wt 237.0 lb

## 2012-12-07 DIAGNOSIS — M25561 Pain in right knee: Secondary | ICD-10-CM

## 2012-12-07 DIAGNOSIS — Z0000 Encounter for general adult medical examination without abnormal findings: Secondary | ICD-10-CM

## 2012-12-07 DIAGNOSIS — Z23 Encounter for immunization: Secondary | ICD-10-CM

## 2012-12-07 DIAGNOSIS — M25519 Pain in unspecified shoulder: Secondary | ICD-10-CM | POA: Insufficient documentation

## 2012-12-07 DIAGNOSIS — M25569 Pain in unspecified knee: Secondary | ICD-10-CM

## 2012-12-07 DIAGNOSIS — E669 Obesity, unspecified: Secondary | ICD-10-CM

## 2012-12-07 MED ORDER — ORLISTAT 120 MG PO CAPS: 120.0000 mg | ORAL_CAPSULE | Freq: Three times a day (TID) | ORAL | Status: DC

## 2012-12-07 MED ORDER — VITAMIN D 1000 UNITS PO TABS: 1000.0000 [IU] | ORAL_TABLET | Freq: Every day | ORAL | Status: DC

## 2012-12-07 NOTE — Progress Notes (Signed)
Pre visit review using our clinic review tool, if applicable. No additional management support is needed unless otherwise documented below in the visit note. 

## 2012-12-07 NOTE — Assessment & Plan Note (Signed)
Continue with current prescription therapy as reflected on the Med list.  

## 2012-12-07 NOTE — Assessment & Plan Note (Signed)
We discussed age appropriate health related issues, including available/recomended screening tests and vaccinations. We discussed a need for adhering to healthy diet and exercise. Labs/EKG were reviewed/ordered. All questions were answered.   

## 2012-12-07 NOTE — Progress Notes (Signed)
Subjective:    HPI  The patient is here for a wellness exam. The patient has been doing well overall without major physical or psychological issues going on lately.  F/u R knee pain - better after several surgeries F/u insomnia sometimes...worse when traveling. C/o obesity  BP Readings from Last 3 Encounters:  12/07/12 118/60  08/31/12 156/80  06/16/12 125/74   Wt Readings from Last 3 Encounters:  12/07/12 237 lb (107.502 kg)  08/31/12 238 lb (107.956 kg)  06/15/12 227 lb (102.967 kg)      Review of Systems  Constitutional: Negative for chills, activity change, appetite change, fatigue and unexpected weight change.  HENT: Negative for congestion, mouth sores, nosebleeds, rhinorrhea, sneezing, sore throat and trouble swallowing.   Eyes: Negative for itching and visual disturbance.  Respiratory: Positive for apnea. Negative for cough, choking, chest tightness, shortness of breath, wheezing and stridor.   Cardiovascular: Negative for chest pain, palpitations and leg swelling.  Gastrointestinal: Negative for nausea, diarrhea, constipation, blood in stool and abdominal distention.  Genitourinary: Negative for dysuria, urgency, frequency, hematuria, flank pain, decreased urine volume and difficulty urinating.  Musculoskeletal: Positive for arthralgias. Negative for back pain, gait problem, joint swelling, myalgias and neck pain.  Skin: Negative for pallor, rash and wound.  Neurological: Negative for dizziness, tremors, speech difficulty and weakness.  Hematological: Does not bruise/bleed easily.  Psychiatric/Behavioral: Positive for sleep disturbance. Negative for suicidal ideas, confusion, dysphoric mood and agitation. The patient is not nervous/anxious.        Objective:   Physical Exam  Constitutional: He is oriented to person, place, and time. He appears well-developed and well-nourished. No distress.  HENT:  Head: Normocephalic and atraumatic.  Right Ear: External ear  normal.  Left Ear: External ear normal.  Nose: Nose normal.  Mouth/Throat: Oropharynx is clear and moist. No oropharyngeal exudate.  Eyes: Conjunctivae and EOM are normal. Pupils are equal, round, and reactive to light. Right eye exhibits no discharge. Left eye exhibits no discharge. No scleral icterus.  Neck: Normal range of motion. Neck supple. No JVD present. No tracheal deviation present. No thyromegaly present.  Cardiovascular: Normal rate, regular rhythm, normal heart sounds and intact distal pulses.  Exam reveals no gallop and no friction rub.   No murmur heard. Pulmonary/Chest: Effort normal and breath sounds normal. No stridor. No respiratory distress. He has no wheezes. He has no rales. He exhibits no tenderness.  Abdominal: Soft. Bowel sounds are normal. He exhibits no distension and no mass. There is no tenderness. There is no rebound and no guarding.  Genitourinary: Rectum normal and penis normal. Guaiac negative stool. No penile tenderness.  Prostate 1+ NT  Musculoskeletal: Normal range of motion. He exhibits tenderness (R knee is tender a little). He exhibits no edema.  Lymphadenopathy:    He has no cervical adenopathy.  Neurological: He is alert and oriented to person, place, and time. He has normal reflexes. No cranial nerve deficit. He exhibits normal muscle tone. Coordination normal.  Skin: Skin is warm and dry. No rash noted. He is not diaphoretic. No erythema. No pallor.  Psychiatric: He has a normal mood and affect. His behavior is normal. Judgment and thought content normal.   Lab Results  Component Value Date   WBC 5.1 12/08/2012   HGB 15.3 12/08/2012   HCT 44.6 12/08/2012   PLT 245.0 12/08/2012   GLUCOSE 110* 12/08/2012   CHOL 205* 12/08/2012   TRIG 97.0 12/08/2012   HDL 60.40 12/08/2012   LDLDIRECT 130.9  12/08/2012   LDLCALC 104* 07/29/2007   ALT 22 12/08/2012   AST 19 12/08/2012   NA 138 12/08/2012   K 4.6 12/08/2012   CL 106 12/08/2012   CREATININE 1.1 12/08/2012    BUN 20 12/08/2012   CO2 27 12/08/2012   TSH 1.36 12/08/2012   PSA 1.67 12/08/2012   INR 1.09 06/03/2012        Assessment & Plan:

## 2012-12-08 ENCOUNTER — Other Ambulatory Visit (INDEPENDENT_AMBULATORY_CARE_PROVIDER_SITE_OTHER): Payer: PRIVATE HEALTH INSURANCE

## 2012-12-08 DIAGNOSIS — Z0000 Encounter for general adult medical examination without abnormal findings: Secondary | ICD-10-CM

## 2012-12-08 LAB — CBC WITH DIFFERENTIAL/PLATELET
Basophils Absolute: 0 10*3/uL (ref 0.0–0.1)
Basophils Relative: 0.3 % (ref 0.0–3.0)
Eosinophils Absolute: 0.3 10*3/uL (ref 0.0–0.7)
Eosinophils Relative: 5.4 % — ABNORMAL HIGH (ref 0.0–5.0)
HCT: 44.6 % (ref 39.0–52.0)
Hemoglobin: 15.3 g/dL (ref 13.0–17.0)
Lymphocytes Relative: 23.9 % (ref 12.0–46.0)
Lymphs Abs: 1.2 10*3/uL (ref 0.7–4.0)
MCHC: 34.3 g/dL (ref 30.0–36.0)
MCV: 87.8 fl (ref 78.0–100.0)
Monocytes Absolute: 0.4 10*3/uL (ref 0.1–1.0)
Monocytes Relative: 8.8 % (ref 3.0–12.0)
Neutro Abs: 3.1 10*3/uL (ref 1.4–7.7)
Neutrophils Relative %: 61.6 % (ref 43.0–77.0)
Platelets: 245 10*3/uL (ref 150.0–400.0)
RBC: 5.08 Mil/uL (ref 4.22–5.81)
RDW: 13.8 % (ref 11.5–14.6)
WBC: 5.1 10*3/uL (ref 4.5–10.5)

## 2012-12-08 LAB — LIPID PANEL
Cholesterol: 205 mg/dL — ABNORMAL HIGH (ref 0–200)
HDL: 60.4 mg/dL (ref >39.00)
Total CHOL/HDL Ratio: 3
Triglycerides: 97 mg/dL (ref 0.0–149.0)
VLDL: 19.4 mg/dL (ref 0.0–40.0)

## 2012-12-08 LAB — HEPATIC FUNCTION PANEL
ALT: 22 U/L (ref 0–53)
AST: 19 U/L (ref 0–37)
Albumin: 4.2 g/dL (ref 3.5–5.2)
Alkaline Phosphatase: 61 U/L (ref 39–117)
Bilirubin, Direct: 0.2 mg/dL (ref 0.0–0.3)
Total Bilirubin: 1.1 mg/dL (ref 0.3–1.2)
Total Protein: 6.5 g/dL (ref 6.0–8.3)

## 2012-12-08 LAB — URINALYSIS
Bilirubin Urine: NEGATIVE
Ketones, ur: NEGATIVE
Leukocytes, UA: NEGATIVE
Nitrite: NEGATIVE
Specific Gravity, Urine: 1.025 (ref 1.000–1.030)
Total Protein, Urine: NEGATIVE
Urine Glucose: NEGATIVE
Urobilinogen, UA: 0.2 (ref 0.0–1.0)
pH: 6 (ref 5.0–8.0)

## 2012-12-08 LAB — BASIC METABOLIC PANEL
BUN: 20 mg/dL (ref 6–23)
CO2: 27 mEq/L (ref 19–32)
Calcium: 9.4 mg/dL (ref 8.4–10.5)
Chloride: 106 mEq/L (ref 96–112)
Creatinine, Ser: 1.1 mg/dL (ref 0.4–1.5)
GFR: 69.33 mL/min (ref >60.00)
Glucose, Bld: 110 mg/dL — ABNORMAL HIGH (ref 70–99)
Potassium: 4.6 mEq/L (ref 3.5–5.1)
Sodium: 138 mEq/L (ref 135–145)

## 2012-12-08 LAB — LDL CHOLESTEROL, DIRECT: Direct LDL: 130.9 mg/dL

## 2012-12-08 LAB — TSH: TSH: 1.36 u[IU]/mL (ref 0.35–5.50)

## 2012-12-08 LAB — PSA: PSA: 1.67 ng/mL (ref 0.10–4.00)

## 2012-12-10 ENCOUNTER — Telehealth: Payer: Self-pay | Admitting: *Deleted

## 2012-12-10 NOTE — Telephone Encounter (Signed)
What did the pharmacy suggest? Thx

## 2012-12-10 NOTE — Telephone Encounter (Signed)
Rec fax from [facility] stating pt is requesting a cheaper alternative for Xenical 120 mg 1 po tid. Please advise.

## 2012-12-11 NOTE — Telephone Encounter (Signed)
Rec fax from [employer] stating insurance prefers Qsymia. Please advise.

## 2012-12-11 NOTE — Telephone Encounter (Signed)
Left message for pharmacy to advise on below.

## 2012-12-13 NOTE — Telephone Encounter (Signed)
pls inform the pt: would he like to try it? Thx

## 2012-12-14 NOTE — Telephone Encounter (Signed)
Left detailed mess informing pt of below and to call back with his approval.

## 2012-12-15 NOTE — Assessment & Plan Note (Signed)
Discussed See Rx

## 2013-02-02 ENCOUNTER — Telehealth: Payer: Self-pay | Admitting: Neurology

## 2013-02-02 DIAGNOSIS — G542 Cervical root disorders, not elsewhere classified: Secondary | ICD-10-CM

## 2013-02-02 NOTE — Telephone Encounter (Signed)
Patient calling regarding setting up a follow up appointment for his right shoulder - he questions whether he needs another nerve conduction test as it is not healing as fast as it should. Please call to advise.

## 2013-02-02 NOTE — Telephone Encounter (Signed)
Patient has been scheduled, will call back to confirm

## 2013-02-02 NOTE — Telephone Encounter (Signed)
I called the patient. The patient indicates that he has made some slow progress with his right arm strength, he'll come in tomorrow for a revisit, but I would like to convert this to an EMG study. I'll order nerve conductions on both arms, EMG on the right arm.

## 2013-02-03 ENCOUNTER — Ambulatory Visit (INDEPENDENT_AMBULATORY_CARE_PROVIDER_SITE_OTHER): Payer: PRIVATE HEALTH INSURANCE | Admitting: Neurology

## 2013-02-03 ENCOUNTER — Encounter (INDEPENDENT_AMBULATORY_CARE_PROVIDER_SITE_OTHER): Payer: Self-pay

## 2013-02-03 ENCOUNTER — Encounter: Payer: Self-pay | Admitting: Neurology

## 2013-02-03 DIAGNOSIS — Z0289 Encounter for other administrative examinations: Secondary | ICD-10-CM

## 2013-02-03 DIAGNOSIS — G542 Cervical root disorders, not elsewhere classified: Secondary | ICD-10-CM

## 2013-02-03 DIAGNOSIS — G540 Brachial plexus disorders: Secondary | ICD-10-CM

## 2013-02-03 HISTORY — DX: Brachial plexus disorders: G54.0

## 2013-02-03 NOTE — Procedures (Signed)
     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 The patient began having numbness and weakness of the right arm around this time. The patient has had persistent weakness of the shoulder girdle muscles. The patient is being reevaluated for this issue.   NERVE CONDUCTION STUDIES:  Nerve conduction studies were performed on both upper extremities. The distal motor latencies and motor amplitudes for the median and ulnar nerves were within normal limits. The distal motor latencies for the radial nerves were slightly prolonged on the right, normal on the left, with normal motor amplitudes for these nerves bilaterally. The nerve conduction velocities for the radial nerves were normal bilaterally. The F wave latencies and nerve conduction velocities for these nerves were also normal. The sensory latencies for the median, radial, and ulnar nerves were normal.   EMG STUDIES:  EMG study was performed on the right upper extremity:  The first dorsal interosseous muscle reveals 2 to 5 K units with decreased recruitment. No fibrillations or positive waves were noted. The abductor pollicis brevis muscle reveals 2 to 5 K units with decreased recruitment. No fibrillations or positive waves were noted. The extensor indicis proprius muscle reveals 2 to 4 K units with slightly decreased recruitment. No fibrillations or positive waves were noted. The pronator teres muscle reveals 2 to 5 K units with decreased recruitment. No fibrillations or positive waves were noted. The biceps muscle reveals 1 to 2 K units with full recruitment. No fibrillations or positive waves were noted. The triceps muscle reveals 2 to 4 K units with full recruitment. No fibrillations or positive waves were noted. The anterior deltoid muscle reveals no voluntary motor units. 2+ fibrillations and positive waves were noted. The supraspinatus muscle reveals no voluntary motor  units. 2+ fibrillations and popliteus were noted. The cervical paraspinal muscles were tested at 2 levels. No abnormalities of insertional activity were seen at either level tested. There was fair relaxation.   IMPRESSION:  Nerve conduction studies done on both upper extremities were relatively unremarkable. No evidence of a neuropathy is seen. EMG evaluation of the right upper extremity reveals evidence of severe, end-stage denervation involving the deltoid muscle and supraspinatus muscles. This study is similar to what was seen on 09/15/2012. This study would suggest a poor prognosis for recovery. This study suggests a partial injury to the upper trunk of the brachial plexus.  Jill Alexanders MD 02/03/2013 6:19 PM  [facility] [address] [place], [place] [address]  Phone [phone] Fax [phone]

## 2013-02-03 NOTE — Addendum Note (Signed)
Addended by: Margette Fast on: 02/03/2013 06:28 PM   Modules accepted: Level of Service

## 2013-02-03 NOTE — Progress Notes (Signed)
Chris Obrien is a 63 year old gentleman with a history of a "red man syndrome" associated with onset of numbness and weakness of the right arm. This occurred in April 2014. The patient has had persistent weakness involving the proximal muscles of the arm and shoulder girdle. The patient is being reevaluated for this issue.  Clinical examination today shows good strength throughout the arm, with improvement in strength of the biceps muscle, supination, but continue weakness with abduction of the right arm, and external rotation of the right arm.  EMG study today again shows end-stage denervation of the right deltoid and supraspinatus muscle, sparing of denervation involving the cervical paraspinal muscles. This study would suggest a partial injury to the upper trunk of the brachial plexus. The study suggests a poor prognosis for full recovery.

## 2013-02-09 ENCOUNTER — Telehealth: Payer: Self-pay | Admitting: *Deleted

## 2013-02-09 DIAGNOSIS — G545 Neuralgic amyotrophy: Secondary | ICD-10-CM

## 2013-02-09 NOTE — Telephone Encounter (Signed)
Patient called stating he has an issue with nerve in his shoulder. Dr. Jannifer Franklin was supposed to review findings and consult with Dr. Theda Sers. Can someone call me regarding this and what is the next plan of care.

## 2013-02-09 NOTE — Telephone Encounter (Signed)
I called the patient. I sent Dr. Theda Sers and lateral guarding the EMG evaluation. I am not clear that there is a surgical treatment for this issue. I'll be happy to send this patient for a second opinion, and he appears to be amenable to this. I'll try to send him to [facility].

## 2013-04-09 ENCOUNTER — Other Ambulatory Visit: Payer: Self-pay | Admitting: Internal Medicine

## 2013-04-13 NOTE — Telephone Encounter (Signed)
OK to refill? Last OV 12.1.14

## 2013-04-15 ENCOUNTER — Telehealth: Payer: Self-pay | Admitting: Internal Medicine

## 2013-04-15 NOTE — Telephone Encounter (Signed)
Called into pharmacy

## 2013-04-15 NOTE — Telephone Encounter (Signed)
Pt is waiting for a refill on Ambien to be sent to [facility] on [address]  He is going out of town next week.  The pharmacy sent a request, but has not heard back.

## 2013-06-16 ENCOUNTER — Encounter: Payer: Self-pay | Admitting: Internal Medicine

## 2013-08-05 ENCOUNTER — Encounter: Payer: Self-pay | Admitting: Family Medicine

## 2013-08-05 ENCOUNTER — Ambulatory Visit (INDEPENDENT_AMBULATORY_CARE_PROVIDER_SITE_OTHER): Payer: PRIVATE HEALTH INSURANCE | Admitting: Family Medicine

## 2013-08-05 VITALS — BP 132/90 | HR 74 | Temp 98.5°F | Ht 73.0 in | Wt 242.0 lb

## 2013-08-05 DIAGNOSIS — B029 Zoster without complications: Secondary | ICD-10-CM

## 2013-08-05 MED ORDER — VALACYCLOVIR HCL 1 G PO TABS: 1000.0000 mg | ORAL_TABLET | Freq: Three times a day (TID) | ORAL | Status: DC

## 2013-08-05 NOTE — Progress Notes (Signed)
No chief complaint on file.   HPI:  Acute visit for:  1) Rash: -reports: started 2 days ago -vesicular itchy rash on R mid back -denies: fevers, malaise, rash elsewhere -he doesn't know if he had chicken pox but sister did and he thinks may have had shingles vaccine but can't remember and not in chart  ROS: See pertinent positives and negatives per HPI.  Past Medical History  Diagnosis Date  . Benign neoplasm of colon   . Insomnia, unspecified   . Oligospermia   . Swelling of joint of right knee   . Right knee skin infection   . BPH (benign prostatic hypertrophy)     hx small stream  . Arthritis   . Acute reactive arthritis per pcp note from 03-30-2012    right pre-patella---  secondary contact dermitis with poison ivy exposure 03-26-2012  . OSA on CPAP     PT LOST  WEIGHT - NO LONGER USING CPAP  . 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 Knee arthroscopy Right aug 2012  . Ulnar nerve repair Right 2008  . 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 Irrigation and debridement knee Right 04/02/2012    Procedure: IRRIGATION AND DEBRIDEMENT RIGHT PRE-PATELLA  ;  Surgeon: Mauri Pole, MD;  Location: [place];  Service: Orthopedics;  Laterality: Right;  . Excisional total knee arthroplasty with antibiotic spacers Right 04/27/2012    Procedure: EXCISIONAL TOTAL KNEE ARTHROPLASTY WITH ANTIBIOTIC SPACERS;  Surgeon: Mauri Pole, MD;  Location: [facility];  Service: Orthopedics;  Laterality: Right;  SAME DAY LABS  . Total knee revision Right 06/15/2012    Procedure: reimplantation of right total knee ;  Surgeon: Mauri Pole, MD;  Location: [facility];  Service: Orthopedics;  Laterality: Right;  .  Tonsillectomy      Family History  Problem Relation Age of Onset  . Hyperlipidemia    . Alzheimer's disease Mother   . Arthritis Father   . Diabetes Brother     History   Social History  . Marital Status: Married    Spouse Name: N/A    Number of Children: N/A  . Years of Education: 16   Occupational History  . 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 Smoking status: Former Smoker    Types: Cigars    Quit date: 01/08/2008  . Smokeless tobacco: Never Used  . Alcohol Use: Yes     Comment: occasionally  . Drug Use: No  . Sexual Activity: None   Other Topics Concern  . None   Social History Narrative  . None    Current outpatient prescriptions:folic acid (FOLVITE) 1 MG tablet, Take 1 mg by mouth every morning. , Disp: , Rfl: ;  Glucosamine-Chondroit-Vit C-Mn (GLUCOSAMINE CHONDR 1500 COMPLX) CAPS, Take 1 capsule by mouth daily., Disp: , Rfl: ;  Multiple Vitamin (MULTIVITAMIN WITH MINERALS) TABS, Take 1 tablet by mouth every morning. , Disp: , Rfl: ;  naproxen sodium (ANAPROX) 220 MG tablet, Take 220 mg by mouth daily as needed., Disp: , Rfl:  tamsulosin (FLOMAX) 0.4 MG CAPS capsule, Take 0.4 mg by mouth.,  Disp: , Rfl: ;  zolpidem (AMBIEN) 5 MG tablet, TAKE 1 TABLET BY MOUTH ONCE DAILY AT BEDTIME, Disp: 30 tablet, Rfl: 3;  valACYclovir (VALTREX) 1000 MG tablet, Take 1 tablet (1,000 mg total) by mouth 3 (three) times daily., Disp: 21 tablet, Rfl: 0  EXAM:  Filed Vitals:   08/05/13 1605  BP: 132/90  Pulse: 74  Temp: 98.5 F (36.9 C)    Body mass index is 31.93 kg/(m^2).  GENERAL: vitals reviewed and listed above, alert, oriented, appears well hydrated and in no acute distress  HEENT: atraumatic, conjunttiva clear, no obvious abnormalities on inspection of external nose and ears  NECK: no obvious masses on inspection  SKIN: vesicular erythematous rash in dermatomal pattern on R mid back  MS: moves all extremities without noticeable  abnormality  PSYCH: pleasant and cooperative, no obvious depression or anxiety  ASSESSMENT AND PLAN:  Discussed the following assessment and plan:  Shingles - Plan: valACYclovir (VALTREX) 1000 MG tablet  -we discussed possible serious and likely etiologies, workup and treatment, treatment risks and return precautions -after this discussion, Amadi opted for antiviral tx for likely shingles -of course, we advised Danzel  to return or notify a doctor immediately if symptoms worsen or persist or new concerns arise.  -Patient advised to return or notify a doctor immediately if symptoms worsen or persist or new concerns arise.  There are no Patient Instructions on file for this visit.   Colin Benton R.

## 2013-08-05 NOTE — Patient Instructions (Signed)

## 2013-08-05 NOTE — Progress Notes (Signed)
Pre visit review using our clinic review tool, if applicable. No additional management support is needed unless otherwise documented below in the visit note. 

## 2013-09-02 ENCOUNTER — Encounter: Payer: Self-pay | Admitting: Internal Medicine

## 2013-10-22 ENCOUNTER — Other Ambulatory Visit: Payer: Self-pay

## 2013-12-17 ENCOUNTER — Encounter: Payer: Self-pay | Admitting: Internal Medicine

## 2013-12-17 ENCOUNTER — Ambulatory Visit (INDEPENDENT_AMBULATORY_CARE_PROVIDER_SITE_OTHER): Payer: PRIVATE HEALTH INSURANCE | Admitting: Internal Medicine

## 2013-12-17 VITALS — BP 122/74 | HR 85 | Temp 98.5°F | Ht 73.0 in | Wt 247.0 lb

## 2013-12-17 DIAGNOSIS — M5412 Radiculopathy, cervical region: Secondary | ICD-10-CM

## 2013-12-17 DIAGNOSIS — G4733 Obstructive sleep apnea (adult) (pediatric): Secondary | ICD-10-CM

## 2013-12-17 DIAGNOSIS — Z23 Encounter for immunization: Secondary | ICD-10-CM

## 2013-12-17 DIAGNOSIS — Z0000 Encounter for general adult medical examination without abnormal findings: Secondary | ICD-10-CM

## 2013-12-17 MED ORDER — ZOLPIDEM TARTRATE ER 6.25 MG PO TBCR: 6.2500 mg | EXTENDED_RELEASE_TABLET | Freq: Every evening | ORAL | Status: DC | PRN

## 2013-12-17 NOTE — Assessment & Plan Note (Signed)
We discussed age appropriate health related issues, including available/recomended screening tests and vaccinations. We discussed a need for adhering to healthy diet and exercise. Labs/EKG were reviewed/ordered. All questions were answered.   

## 2013-12-17 NOTE — Progress Notes (Signed)
Pre visit review using our clinic review tool, if applicable. No additional management support is needed unless otherwise documented below in the visit note. 

## 2013-12-17 NOTE — Assessment & Plan Note (Signed)
Pt is unable to use a mask.  Will sch w/Dr Dohmeier for a consultation

## 2013-12-17 NOTE — Progress Notes (Signed)
Subjective:    HPI  The patient is here for a wellness exam. The patient has been doing well overall without major physical or psychological issues going on lately. C/o OSA  F/u R knee pain - better after several surgeries F/u insomnia sometimes...worse when traveling. C/o obesity  BP Readings from Last 3 Encounters:  12/17/13 122/74  08/05/13 132/90  12/07/12 118/60   Wt Readings from Last 3 Encounters:  12/17/13 247 lb (112.038 kg)  08/05/13 242 lb (109.77 kg)  12/07/12 237 lb (107.502 kg)      Review of Systems  Constitutional: Negative for chills, activity change, appetite change, fatigue and unexpected weight change.  HENT: Negative for congestion, mouth sores, nosebleeds, rhinorrhea, sneezing, sore throat and trouble swallowing.   Eyes: Negative for itching and visual disturbance.  Respiratory: Positive for apnea. Negative for cough, choking, chest tightness, shortness of breath, wheezing and stridor.   Cardiovascular: Negative for chest pain, palpitations and leg swelling.  Gastrointestinal: Negative for nausea, diarrhea, constipation, blood in stool and abdominal distention.  Genitourinary: Negative for dysuria, urgency, frequency, hematuria, flank pain, decreased urine volume and difficulty urinating.  Musculoskeletal: Positive for arthralgias. Negative for myalgias, back pain, joint swelling, gait problem and neck pain.  Skin: Negative for pallor, rash and wound.  Neurological: Negative for dizziness, tremors, speech difficulty and weakness.  Hematological: Does not bruise/bleed easily.  Psychiatric/Behavioral: Positive for sleep disturbance. Negative for suicidal ideas, confusion, dysphoric mood and agitation. The patient is not nervous/anxious.        Objective:   Physical Exam  Constitutional: He is oriented to person, place, and time. He appears well-developed and well-nourished. No distress.  HENT:  Head: Normocephalic and atraumatic.  Right Ear:  External ear normal.  Left Ear: External ear normal.  Nose: Nose normal.  Mouth/Throat: Oropharynx is clear and moist. No oropharyngeal exudate.  Eyes: Conjunctivae and EOM are normal. Pupils are equal, round, and reactive to light. Right eye exhibits no discharge. Left eye exhibits no discharge. No scleral icterus.  Neck: Normal range of motion. Neck supple. No JVD present. No tracheal deviation present. No thyromegaly present.  Cardiovascular: Normal rate, regular rhythm, normal heart sounds and intact distal pulses.  Exam reveals no gallop and no friction rub.   No murmur heard. Pulmonary/Chest: Effort normal and breath sounds normal. No stridor. No respiratory distress. He has no wheezes. He has no rales. He exhibits no tenderness.  Abdominal: Soft. Bowel sounds are normal. He exhibits no distension and no mass. There is no tenderness. There is no rebound and no guarding.  Genitourinary: Rectum normal, prostate normal and penis normal. Guaiac negative stool. No penile tenderness.  Musculoskeletal: Normal range of motion. He exhibits no edema or tenderness.  Lymphadenopathy:    He has no cervical adenopathy.  Neurological: He is alert and oriented to person, place, and time. He has normal reflexes. No cranial nerve deficit. He exhibits normal muscle tone. Coordination normal.  Skin: Skin is warm and dry. No rash noted. He is not diaphoretic. No erythema. No pallor.  Psychiatric: He has a normal mood and affect. His behavior is normal. Judgment and thought content normal.  R shoulder w/atrophy Rectal - per Urology next week  Lab Results  Component Value Date   WBC 5.1 12/08/2012   HGB 15.3 12/08/2012   HCT 44.6 12/08/2012   PLT 245.0 12/08/2012   GLUCOSE 110* 12/08/2012   CHOL 205* 12/08/2012   TRIG 97.0 12/08/2012   HDL 60.40 12/08/2012  LDLDIRECT 130.9 12/08/2012   LDLCALC 104* 07/29/2007   ALT 22 12/08/2012   AST 19 12/08/2012   NA 138 12/08/2012   K 4.6 12/08/2012   CL 106  12/08/2012   CREATININE 1.1 12/08/2012   BUN 20 12/08/2012   CO2 27 12/08/2012   TSH 1.36 12/08/2012   PSA 1.67 12/08/2012   INR 1.09 06/03/2012        Assessment & Plan:

## 2013-12-17 NOTE — Patient Instructions (Signed)
Low carb diet 

## 2013-12-17 NOTE — Assessment & Plan Note (Signed)
Finished PT 

## 2013-12-21 ENCOUNTER — Ambulatory Visit (INDEPENDENT_AMBULATORY_CARE_PROVIDER_SITE_OTHER): Payer: PRIVATE HEALTH INSURANCE | Admitting: *Deleted

## 2013-12-21 ENCOUNTER — Other Ambulatory Visit (INDEPENDENT_AMBULATORY_CARE_PROVIDER_SITE_OTHER): Payer: PRIVATE HEALTH INSURANCE

## 2013-12-21 DIAGNOSIS — Z23 Encounter for immunization: Secondary | ICD-10-CM

## 2013-12-21 DIAGNOSIS — Z0000 Encounter for general adult medical examination without abnormal findings: Secondary | ICD-10-CM

## 2013-12-21 LAB — CBC WITH DIFFERENTIAL/PLATELET
Basophils Absolute: 0 10*3/uL (ref 0.0–0.1)
Basophils Relative: 0.1 % (ref 0.0–3.0)
Eosinophils Absolute: 0.3 10*3/uL (ref 0.0–0.7)
Eosinophils Relative: 6.5 % — ABNORMAL HIGH (ref 0.0–5.0)
HCT: 46 % (ref 39.0–52.0)
Hemoglobin: 15.6 g/dL (ref 13.0–17.0)
Lymphocytes Relative: 24.2 % (ref 12.0–46.0)
Lymphs Abs: 1.2 10*3/uL (ref 0.7–4.0)
MCHC: 34 g/dL (ref 30.0–36.0)
MCV: 89.4 fl (ref 78.0–100.0)
Monocytes Absolute: 0.4 10*3/uL (ref 0.1–1.0)
Monocytes Relative: 8.6 % (ref 3.0–12.0)
Neutro Abs: 3.1 10*3/uL (ref 1.4–7.7)
Neutrophils Relative %: 60.6 % (ref 43.0–77.0)
Platelets: 266 10*3/uL (ref 150.0–400.0)
RBC: 5.14 Mil/uL (ref 4.22–5.81)
RDW: 13.1 % (ref 11.5–15.5)
WBC: 5.2 10*3/uL (ref 4.0–10.5)

## 2013-12-21 LAB — HEPATIC FUNCTION PANEL
ALT: 19 U/L (ref 0–53)
AST: 16 U/L (ref 0–37)
Albumin: 4 g/dL (ref 3.5–5.2)
Alkaline Phosphatase: 64 U/L (ref 39–117)
Bilirubin, Direct: 0.1 mg/dL (ref 0.0–0.3)
Total Bilirubin: 0.5 mg/dL (ref 0.2–1.2)
Total Protein: 6 g/dL (ref 6.0–8.3)

## 2013-12-21 LAB — HEMOGLOBIN A1C: Hgb A1c MFr Bld: 5.4 % (ref 4.6–6.5)

## 2013-12-21 LAB — BASIC METABOLIC PANEL
BUN: 24 mg/dL — ABNORMAL HIGH (ref 6–23)
CO2: 25 mEq/L (ref 19–32)
Calcium: 8.8 mg/dL (ref 8.4–10.5)
Chloride: 110 mEq/L (ref 96–112)
Creatinine, Ser: 1 mg/dL (ref 0.4–1.5)
GFR: 76.91 mL/min (ref >60.00)
Glucose, Bld: 108 mg/dL — ABNORMAL HIGH (ref 70–99)
Potassium: 4.7 mEq/L (ref 3.5–5.1)
Sodium: 139 mEq/L (ref 135–145)

## 2013-12-21 LAB — TSH: TSH: 2.08 u[IU]/mL (ref 0.35–4.50)

## 2013-12-21 LAB — URINALYSIS
Bilirubin Urine: NEGATIVE
Hgb urine dipstick: NEGATIVE
Ketones, ur: NEGATIVE
Leukocytes, UA: NEGATIVE
Nitrite: NEGATIVE
Specific Gravity, Urine: 1.025 (ref 1.000–1.030)
Total Protein, Urine: NEGATIVE
Urine Glucose: NEGATIVE
Urobilinogen, UA: 0.2 (ref 0.0–1.0)
pH: 6 (ref 5.0–8.0)

## 2013-12-21 LAB — LIPID PANEL
Cholesterol: 173 mg/dL (ref 0–200)
HDL: 50.5 mg/dL (ref >39.00)
LDL Cholesterol: 108 mg/dL — ABNORMAL HIGH (ref 0–99)
NonHDL: 122.5
Total CHOL/HDL Ratio: 3
Triglycerides: 71 mg/dL (ref 0.0–149.0)
VLDL: 14.2 mg/dL (ref 0.0–40.0)

## 2013-12-21 LAB — PSA: PSA: 1.95 ng/mL (ref 0.10–4.00)

## 2013-12-21 LAB — TESTOSTERONE: Testosterone: 327.23 ng/dL (ref 300.00–890.00)

## 2014-01-27 ENCOUNTER — Encounter: Payer: Self-pay | Admitting: Internal Medicine

## 2014-01-27 ENCOUNTER — Ambulatory Visit (INDEPENDENT_AMBULATORY_CARE_PROVIDER_SITE_OTHER): Payer: PRIVATE HEALTH INSURANCE | Admitting: Internal Medicine

## 2014-01-27 VITALS — BP 150/98 | HR 66 | Temp 98.9°F | Wt 243.0 lb

## 2014-01-27 DIAGNOSIS — IMO0001 Reserved for inherently not codable concepts without codable children: Secondary | ICD-10-CM

## 2014-01-27 DIAGNOSIS — G4733 Obstructive sleep apnea (adult) (pediatric): Secondary | ICD-10-CM

## 2014-01-27 DIAGNOSIS — H8111 Benign paroxysmal vertigo, right ear: Secondary | ICD-10-CM

## 2014-01-27 DIAGNOSIS — H8110 Benign paroxysmal vertigo, unspecified ear: Secondary | ICD-10-CM | POA: Insufficient documentation

## 2014-01-27 DIAGNOSIS — R030 Elevated blood-pressure reading, without diagnosis of hypertension: Secondary | ICD-10-CM | POA: Insufficient documentation

## 2014-01-27 DIAGNOSIS — B001 Herpesviral vesicular dermatitis: Secondary | ICD-10-CM | POA: Insufficient documentation

## 2014-01-27 NOTE — Progress Notes (Signed)
Subjective:    HPI  C/o dizziness - episodic x several days. He went hunting and drove to [employer] to [place] a few days prior. No n/v. No sx's at rest. No HA. F/u OSA   F/u insomnia sometimes...worse when traveling.   BP Readings from Last 3 Encounters:  01/27/14 150/98  12/17/13 122/74  08/05/13 132/90   Wt Readings from Last 3 Encounters:  01/27/14 243 lb (110.224 kg)  12/17/13 247 lb (112.038 kg)  08/05/13 242 lb (109.77 kg)      Review of Systems  Constitutional: Negative for chills, activity change, appetite change, fatigue and unexpected weight change.  HENT: Negative for congestion, mouth sores, nosebleeds, rhinorrhea, sneezing, sore throat and trouble swallowing.   Eyes: Negative for itching and visual disturbance.  Respiratory: Positive for apnea. Negative for cough, choking, chest tightness, shortness of breath, wheezing and stridor.   Cardiovascular: Negative for chest pain, palpitations and leg swelling.  Gastrointestinal: Negative for nausea, diarrhea, constipation, blood in stool and abdominal distention.  Genitourinary: Negative for dysuria, urgency, frequency, hematuria, flank pain, decreased urine volume and difficulty urinating.  Musculoskeletal: Positive for arthralgias. Negative for myalgias, back pain, joint swelling, gait problem and neck pain.  Skin: Negative for pallor, rash and wound.  Neurological: Negative for dizziness, tremors, speech difficulty and weakness.  Hematological: Does not bruise/bleed easily.  Psychiatric/Behavioral: Positive for sleep disturbance. Negative for suicidal ideas, confusion, dysphoric mood and agitation. The patient is not nervous/anxious.        Objective:   Physical Exam  Constitutional: He is oriented to person, place, and time. He appears well-developed and well-nourished. No distress.  HENT:  Head: Normocephalic and atraumatic.  Right Ear: External ear normal.  Left Ear: External ear normal.  Nose:  Nose normal.  Mouth/Throat: Oropharynx is clear and moist. No oropharyngeal exudate.  Eyes: Conjunctivae and EOM are normal. Pupils are equal, round, and reactive to light. Right eye exhibits no discharge. Left eye exhibits no discharge. No scleral icterus.  Neck: Normal range of motion. Neck supple. No JVD present. No tracheal deviation present. No thyromegaly present.  Cardiovascular: Normal rate, regular rhythm, normal heart sounds and intact distal pulses.  Exam reveals no gallop and no friction rub.   No murmur heard. Pulmonary/Chest: Effort normal and breath sounds normal. No stridor. No respiratory distress. He has no wheezes. He has no rales. He exhibits no tenderness.  Abdominal: Soft. Bowel sounds are normal. He exhibits no distension and no mass. There is no tenderness. There is no rebound and no guarding.  Genitourinary: Rectum normal, prostate normal and penis normal. Guaiac negative stool. No penile tenderness.  Musculoskeletal: Normal range of motion. He exhibits no edema or tenderness.  Lymphadenopathy:    He has no cervical adenopathy.  Neurological: He is alert and oriented to person, place, and time. He has normal reflexes. No cranial nerve deficit. He exhibits normal muscle tone. Coordination normal.  Skin: Skin is warm and dry. No rash noted. He is not diaphoretic. No erythema. No pallor.  Psychiatric: He has a normal mood and affect. His behavior is normal. Judgment and thought content normal.  R shoulder w/atrophy H-P (+) R R upper lip dry blisters x5 Lab Results  Component Value Date   WBC 5.2 12/21/2013   HGB 15.6 12/21/2013   HCT 46.0 12/21/2013   PLT 266.0 12/21/2013   GLUCOSE 108* 12/21/2013   CHOL 173 12/21/2013   TRIG 71.0 12/21/2013   HDL 50.50 12/21/2013   LDLDIRECT 130.9  12/08/2012   LDLCALC 108* 12/21/2013   ALT 19 12/21/2013   AST 16 12/21/2013   NA 139 12/21/2013   K 4.7 12/21/2013   CL 110 12/21/2013   CREATININE 1.0 12/21/2013   BUN 24*  12/21/2013   CO2 25 12/21/2013   TSH 2.08 12/21/2013   PSA 1.95 12/21/2013   INR 1.09 06/03/2012   HGBA1C 5.4 12/21/2013        Assessment & Plan:

## 2014-01-27 NOTE — Assessment & Plan Note (Signed)
Appt w/Dr Dohmeier is pending He lost wt

## 2014-01-27 NOTE — Assessment & Plan Note (Signed)
1/16 R upper lip - recurrent Pt declined Rx

## 2014-01-27 NOTE — Assessment & Plan Note (Signed)
Pt is on diet - loosing wt Will address OSA issue Monitor BP  BP Readings from Last 3 Encounters:  01/27/14 150/98  12/17/13 122/74  08/05/13 132/90

## 2014-01-27 NOTE — Assessment & Plan Note (Signed)
Benign Positional Vertigo symptoms on the R. Start Meclizine. Start Brandt - Daroff exercise several times a day as dirrected.  

## 2014-01-27 NOTE — Patient Instructions (Signed)
Benign Positional Vertigo symptoms on the R. Start Meclizine. Start Brandt - Daroff exercise several times a day as dirrected.  

## 2014-01-28 ENCOUNTER — Ambulatory Visit: Payer: PRIVATE HEALTH INSURANCE | Admitting: Internal Medicine

## 2014-01-31 ENCOUNTER — Institutional Professional Consult (permissible substitution): Payer: PRIVATE HEALTH INSURANCE | Admitting: Neurology

## 2014-02-14 ENCOUNTER — Ambulatory Visit (INDEPENDENT_AMBULATORY_CARE_PROVIDER_SITE_OTHER): Payer: PRIVATE HEALTH INSURANCE | Admitting: Neurology

## 2014-02-14 ENCOUNTER — Encounter: Payer: Self-pay | Admitting: Neurology

## 2014-02-14 VITALS — BP 170/97 | HR 69 | Resp 16 | Ht 73.25 in | Wt 242.0 lb

## 2014-02-14 DIAGNOSIS — J0120 Acute ethmoidal sinusitis, unspecified: Secondary | ICD-10-CM

## 2014-02-14 DIAGNOSIS — H8111 Benign paroxysmal vertigo, right ear: Secondary | ICD-10-CM

## 2014-02-14 DIAGNOSIS — G4733 Obstructive sleep apnea (adult) (pediatric): Secondary | ICD-10-CM | POA: Insufficient documentation

## 2014-02-14 MED ORDER — MOMETASONE FUROATE 50 MCG/ACT NA SUSP: 2.0000 | Freq: Every day | NASAL | Status: DC

## 2014-02-14 NOTE — Progress Notes (Signed)
[facility]   Provider:  Larey Seat, M D  Referring Provider: Cassandria Anger, MD Primary Care Physician:  Walker Kehr, MD  Chief Complaint  Patient presents with  . NP Sleep consult    Rm 10, alone    HPI:  Chris Obrien is a 64 y.o. male seen here as a referral  from Dr. [name] for a re- evaluation of sleep apnea.   [name]. Reports today that he has been multiple times tried to use a CPAP, was tested last in 2009 with Dr Gwenette Greet. He had seen Dr. Gwenette Greet in 2012 , and finally gave up on CPAP.  He used the nasal pillow, nasal mask. , but had the best result with FFM, he is a mouth breather. He had frequent sinusitis in the past, not currently .                    Review of Systems:   Vertigo, pressure perception in right ear. Runny nose. Fit bit indicated movements, not wakefulness , at night. .   Epworth score 5 , Fatigue severity score 23  , depression score 2 .   History   Social History  . Marital Status: Married    Spouse Name: N/A    Number of Children: N/A  . Years of Education: 16   Occupational History  . 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 Smoking status: Former Smoker    Types: Cigars    Quit date: 01/08/2008  . Smokeless tobacco: Never Used  . Alcohol Use: Yes     Comment: occasionally  . Drug Use: No  . Sexual Activity: Not on file   Other Topics Concern  . Not on file   Social History Narrative   Caffeine 4 cups avg daily, 4 cans soda.    Family History  Problem Relation Age of Onset  . Hyperlipidemia    . Alzheimer's disease Mother   . Arthritis Father   . Diabetes Brother     Past Medical History  Diagnosis Date  . Benign neoplasm of colon   . Insomnia, unspecified   . Oligospermia   . Swelling of joint of right knee   . Right knee skin infection   . BPH (benign prostatic hypertrophy)     hx small stream  . Arthritis   . Acute reactive arthritis per pcp  note from 03-30-2012    right pre-patella---  secondary contact dermitis with poison ivy exposure 03-26-2012  . OSA on CPAP     PT LOST  WEIGHT - NO LONGER USING CPAP  . 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 Knee arthroscopy Right aug 2012  . Ulnar nerve repair Right 2008  . 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 Irrigation and debridement knee Right 04/02/2012    Procedure: IRRIGATION AND DEBRIDEMENT RIGHT PRE-PATELLA  ;  Surgeon: Mauri Pole, MD;  Location: [facility];  Service: Orthopedics;  Laterality: Right;  . Excisional total knee arthroplasty with antibiotic spacers Right 04/27/2012    Procedure: EXCISIONAL TOTAL KNEE ARTHROPLASTY WITH ANTIBIOTIC SPACERS;  Surgeon:  Mauri Pole, MD;  Location: [facility];  Service: Orthopedics;  Laterality: Right;  SAME DAY LABS  . Total knee revision Right 06/15/2012    Procedure: reimplantation of right total knee ;  Surgeon: Mauri Pole, MD;  Location: [facility];  Service: Orthopedics;  Laterality: Right;  . Tonsillectomy      Current Outpatient Prescriptions  Medication Sig Dispense Refill  . folic acid (FOLVITE) 1 MG tablet Take 1 mg by mouth every morning.     . Glucosamine-Chondroit-Vit C-Mn (GLUCOSAMINE CHONDR 1500 COMPLX) CAPS Take 1 capsule by mouth daily.    . Multiple Vitamin (MULTIVITAMIN WITH MINERALS) TABS Take 1 tablet by mouth every morning.     . naproxen sodium (ANAPROX) 220 MG tablet Take 220 mg by mouth daily as needed.    . tamsulosin (FLOMAX) 0.4 MG CAPS capsule Take 0.4 mg by mouth.    . valACYclovir (VALTREX) 1000 MG tablet Take 1 tablet (1,000 mg total) by mouth 3 (three) times daily. 21 tablet 0  . zolpidem (AMBIEN CR) 6.25 MG CR  tablet Take 1 tablet (6.25 mg total) by mouth at bedtime as needed for sleep. 30 tablet 5   No current facility-administered medications for this visit.    Allergies as of 02/14/2014 - Review Complete 02/14/2014  Allergen Reaction Noted  . Vancomycin Rash 04/27/2012  . Oxycodone Rash 04/01/2012    Vitals: BP 170/97 mmHg  Pulse 69  Resp 16  Ht 6' 1.25" (1.861 m)  Wt 242 lb (109.77 kg)  BMI 31.70 kg/m2 Last Weight:  Wt Readings from Last 1 Encounters:  02/14/14 242 lb (109.77 kg)       Last Height:   Ht Readings from Last 1 Encounters:  02/14/14 6' 1.25" (1.861 m)    Physical exam:  General: The patient is awake, alert and appears not in acute distress. The patient is well groomed. Head: Normocephalic, atraumatic. Neck is supple. Mallampati 3 neck circumference: 15 inches  Nasal airflow  Restricted , no nasal septal deviation , TMJ is not evident.  Retrognathia is  Not seen.  Cardiovascular:  Regular rate and rhythm, without  murmurs or carotid bruit, and without distended neck veins. Respiratory: Lungs are clear to auscultation. Skin:  Without evidence of edema, or rash Trunk: BMI is elevated and patient  has normal posture.  Neurologic exam : The patient is awake and alert, oriented to place and time.   Memory subjective described as intact. There is a normal attention span & concentration ability. Speech is fluent without dysarthria, dysphonia or aphasia.  Mood and affect are appropriate.  Cranial nerves: Pupils are equal and briskly reactive to light. Funduscopic exam without evidence of pallor or edema. Extraocular movements  in vertical and horizontal planes intact and without nystagmus. Visual fields by finger perimetry are intact. Hearing to finger rub intact.  Facial sensation intact to fine touch. Facial motor strength is symmetric and tongue and uvula move midline. Shoulder shrug is now equal.   Motor exam:  Normal tone, muscle bulk and symmetric, strength in  all extremities.  Sensory:  Fine touch, pinprick and vibration were tested in all extremities. Proprioception is normal.  Coordination: Rapid alternating movements in the fingers/hands is normal. Finger-to-nose maneuver normal without evidence of ataxia, dysmetria or tremor.  Gait and station: Patient walks without assistive device and is able unassisted to climb up to the exam table.  Strength within normal limits. Stance is stable and normal. Tandem gait is affected by the right  knee- Romberg testing is  negative.  Deep tendon reflexes: in the  upper and lower extremities are symmetric and intact. Babinski maneuver response is downgoing.   Assessment:  After physical and neurologic examination, review of laboratory studies, imaging, neurophysiology testing and pre-existing records, assessment is  55 minutes of which 50% were face to face  Interview,  discussion and answering questions.   1) Mr. Aument was diagnosed with a severe degree of apnea , based on a sleep study from 2009 , performed under Dr. Gwenette Greet. His AHI was 35 his RDI was 51 at the time , but he had afterwards lost some weight in the process of his right knee infection and illness. He has tried a mandibular advancement therapy which did not work well for him but he did not require TMJ.   Secondary,  he also has used several CPAP interfaces and stated that the full face mask seemed to work the best as he is a acquired mouth breather . It may be beneficial to treat his nasal airflow to allow for a different and smaller interface in general. He purchased out right a CPAP machine that is fit to be auto set . He is followed by [facility].  2) rhinitis, sinusitis. Chronic. Treat with nasal spray.  3) Patient has been always a 5-6 hour sleeper, this is his habit and he is currently working less, allowing for an extra hour of sleep.  The patient was advised of the nature of the diagnosed sleep disorder , the treatment options  and risks for general a health and wellness arising from not treating the condition. Visit duration was 35 minutes. This is face to face time, including 50% or more of information, answering questions about alternative treatments  for Sleep apnea.    Plan:  Treatment plan and additional workup :  Nasal spray, baseline study can be HST, will than reset his auto set to the resulting AHI.     Asencion Partridge Chanie Soucek MD  02/14/2014

## 2014-02-14 NOTE — Patient Instructions (Signed)

## 2014-03-08 ENCOUNTER — Ambulatory Visit (INDEPENDENT_AMBULATORY_CARE_PROVIDER_SITE_OTHER): Payer: PRIVATE HEALTH INSURANCE | Admitting: Neurology

## 2014-03-08 DIAGNOSIS — H8111 Benign paroxysmal vertigo, right ear: Secondary | ICD-10-CM

## 2014-03-08 DIAGNOSIS — J0120 Acute ethmoidal sinusitis, unspecified: Secondary | ICD-10-CM

## 2014-03-08 DIAGNOSIS — G4733 Obstructive sleep apnea (adult) (pediatric): Secondary | ICD-10-CM

## 2014-03-09 NOTE — Sleep Study (Signed)
Please see the scanned sleep study interpretation located in the Procedure tab within the Chart Review section. 

## 2014-03-21 ENCOUNTER — Encounter: Payer: Self-pay | Admitting: Neurology

## 2014-03-28 ENCOUNTER — Other Ambulatory Visit: Payer: Self-pay | Admitting: Neurology

## 2014-03-28 ENCOUNTER — Telehealth: Payer: Self-pay | Admitting: *Deleted

## 2014-03-28 DIAGNOSIS — G4733 Obstructive sleep apnea (adult) (pediatric): Secondary | ICD-10-CM

## 2014-03-28 DIAGNOSIS — G4731 Primary central sleep apnea: Secondary | ICD-10-CM

## 2014-03-28 DIAGNOSIS — G4739 Other sleep apnea: Secondary | ICD-10-CM

## 2014-03-28 NOTE — Telephone Encounter (Signed)
Patient returned my call after being [place] country and was given the results of his split night sleep study that confirmed the diagnosis of obstructive and complex sleep apnea.  Patient was informed that a full night titration sleep study was recommended to optimize treatment.  The patient was understanding and in agreement and scheduled his full night titration study for 04/12/14 at 8:00 pm.  Dr. [name] was routed a copy of the report.

## 2014-04-12 ENCOUNTER — Ambulatory Visit (INDEPENDENT_AMBULATORY_CARE_PROVIDER_SITE_OTHER): Payer: PRIVATE HEALTH INSURANCE | Admitting: Neurology

## 2014-04-12 DIAGNOSIS — G4733 Obstructive sleep apnea (adult) (pediatric): Secondary | ICD-10-CM | POA: Diagnosis not present

## 2014-04-12 DIAGNOSIS — G4731 Primary central sleep apnea: Secondary | ICD-10-CM

## 2014-04-12 NOTE — Sleep Study (Signed)
Please see the scanned sleep study interpretation located in the Procedure tab within the Chart Review section. 

## 2014-04-28 ENCOUNTER — Encounter: Payer: Self-pay | Admitting: Neurology

## 2014-04-28 DIAGNOSIS — G4731 Primary central sleep apnea: Secondary | ICD-10-CM | POA: Insufficient documentation

## 2014-04-28 DIAGNOSIS — G4733 Obstructive sleep apnea (adult) (pediatric): Secondary | ICD-10-CM | POA: Insufficient documentation

## 2014-04-28 DIAGNOSIS — G4739 Other sleep apnea: Secondary | ICD-10-CM | POA: Insufficient documentation

## 2014-04-29 ENCOUNTER — Telehealth: Payer: Self-pay | Admitting: *Deleted

## 2014-04-29 NOTE — Telephone Encounter (Signed)
Spoke with Shanon Brow and per RAS, advised that based on recent sleep study, he recommends Bipap ST 24/20/12.  Cherylynn Ridges. he has a bipap--not sure if it is ST.  Will ask Ilhan Madan in the sleep lab to contact him to confirm he has [place] and that settings are correct.  Pt. sts. he is currently out of town and requests Kaheem Halleck. call him next Wednesday (05-04-14) or later/fim

## 2014-05-19 ENCOUNTER — Telehealth: Payer: Self-pay | Admitting: Neurology

## 2014-05-19 NOTE — Telephone Encounter (Signed)
Pt called office requesting results for cpap titration performed 04/12/2014. Pt states he has questions about the study. Pt wants to be contacted to also review status of bipap. Please review this request and call pt to advise.

## 2014-06-03 ENCOUNTER — Telehealth: Payer: Self-pay

## 2014-06-03 DIAGNOSIS — G4733 Obstructive sleep apnea (adult) (pediatric): Secondary | ICD-10-CM

## 2014-06-03 NOTE — Telephone Encounter (Signed)
Returned pt's phone call [id number] results. Apologized profusely for not giving him the results sooner. It seems his results were sent to an employee no longer employed with [place]. Pt accepted apology. Informed him that the recommendations from his sleep study was to start a BiPAPST at 22/18 cm H2O with a backup rate of 12. Pt verbalized understanding and asked [place] to send the order ASAP to [facility]. Pt is traveling a lot coming up but we were able to make a f/u appt for him 06/30/14. This will be a no charge visit per Dr. Brett Fairy. Encouraged pt to use the bipap 4 or more hours nightly, and to bring his machine with him to all appts. Pt again verbalized understanding. Will fax order and study results to [facility].  Spoke with Dr. Brett Fairy re: the results of titration read by Dr. Felecia Shelling. She recommended to Start BiPAP ST 22/18-12 instead of the 24/20-12 as Dr. Felecia Shelling recommended.

## 2014-06-16 ENCOUNTER — Telehealth: Payer: Self-pay | Admitting: Neurology

## 2014-06-16 ENCOUNTER — Telehealth: Payer: Self-pay

## 2014-06-16 DIAGNOSIS — G4731 Primary central sleep apnea: Secondary | ICD-10-CM

## 2014-06-16 DIAGNOSIS — G4730 Sleep apnea, unspecified: Secondary | ICD-10-CM

## 2014-06-16 DIAGNOSIS — G4700 Insomnia, unspecified: Secondary | ICD-10-CM

## 2014-06-16 MED ORDER — ZOLPIDEM TARTRATE 10 MG PO TABS: ORAL_TABLET | ORAL | Status: DC

## 2014-06-16 NOTE — Telephone Encounter (Signed)
I spoke to Mr. Chris Obrien today, his some sleep study was interpreted by Dr. Richardean Chimera on 04-25-14 and he recommended BiPAP ST with a backup rate of 12/m. The patient did not qualify by insurance coverage for BiPAP ST because not 50% or more of his respiratory apneas and hypotony as were central in nature but they were very close to 50%. I discussed with him the dilemma and he stated that he will by the BiPAP machine outright as long as I write a prescription with the settings and see extended over to advanced on care.  I had also advised him that his repeat sleep clinic visit would be free of charge , and his repeat study would be free of charge and he was asked to pay a co-pay of $175 which we should reimburse him for.

## 2014-06-16 NOTE — Telephone Encounter (Signed)
Spoke to pt. Informed him that his RX for Lorrin Mais was faxed to his [facility] on [address] and [address]. Pt verbalized understanding.

## 2014-06-20 ENCOUNTER — Telehealth: Payer: Self-pay | Admitting: Neurology

## 2014-06-20 NOTE — Telephone Encounter (Signed)
Betsy with [place] called regarding Bi-Pap. She can be reached

## 2014-06-30 ENCOUNTER — Ambulatory Visit: Payer: Self-pay | Admitting: Neurology

## 2014-07-07 NOTE — Telephone Encounter (Signed)
error 

## 2014-10-06 ENCOUNTER — Other Ambulatory Visit: Payer: Self-pay

## 2014-10-06 ENCOUNTER — Telehealth: Payer: Self-pay | Admitting: Neurology

## 2014-10-06 DIAGNOSIS — G4700 Insomnia, unspecified: Secondary | ICD-10-CM

## 2014-10-06 DIAGNOSIS — G4730 Sleep apnea, unspecified: Secondary | ICD-10-CM

## 2014-10-06 DIAGNOSIS — G4731 Primary central sleep apnea: Secondary | ICD-10-CM

## 2014-10-06 MED ORDER — ZOLPIDEM TARTRATE 10 MG PO TABS: ORAL_TABLET | ORAL | Status: DC

## 2014-10-06 NOTE — Telephone Encounter (Signed)
Spoke to pt and advised him that I send his Chris Obrien to his [employer]. Pt verbalized understanding.

## 2014-10-06 NOTE — Telephone Encounter (Signed)
Patient called to request refill zolpidem (AMBIEN) 10 MG tablet

## 2014-10-07 ENCOUNTER — Encounter: Payer: Self-pay | Admitting: Internal Medicine

## 2014-10-07 ENCOUNTER — Ambulatory Visit (INDEPENDENT_AMBULATORY_CARE_PROVIDER_SITE_OTHER): Payer: Managed Care, Other (non HMO) | Admitting: Internal Medicine

## 2014-10-07 VITALS — BP 126/71 | HR 71 | Temp 98.3°F | Ht 73.25 in | Wt 243.5 lb

## 2014-10-07 DIAGNOSIS — J209 Acute bronchitis, unspecified: Secondary | ICD-10-CM | POA: Diagnosis not present

## 2014-10-07 DIAGNOSIS — R062 Wheezing: Secondary | ICD-10-CM | POA: Diagnosis not present

## 2014-10-07 MED ORDER — ALBUTEROL SULFATE HFA 108 (90 BASE) MCG/ACT IN AERS: 2.0000 | INHALATION_SPRAY | Freq: Four times a day (QID) | RESPIRATORY_TRACT | Status: DC | PRN

## 2014-10-07 MED ORDER — DOXYCYCLINE HYCLATE 100 MG PO TABS: 100.0000 mg | ORAL_TABLET | Freq: Two times a day (BID) | ORAL | Status: DC

## 2014-10-07 NOTE — Assessment & Plan Note (Signed)
Mild wheeze on expiration fairly diffuse on exam / mild reactive airway disease related to acute bronchitis Antibiotic started Albuterol inhaler as needed for wheeze/sob   Call if no improvement/any worsening

## 2014-10-07 NOTE — Assessment & Plan Note (Signed)
His history and exam is consistent with acute bronchitis with mild reactive airway disease Start doxycycline 100 mg BID twice daily for 10 days Start an albuterol inhaler as needed to help with the sob and wheeze Can continue otc cold medications  Continue increased rest and fluids  Call or return if no improvement or any worsening of symptoms

## 2014-10-07 NOTE — Patient Instructions (Addendum)
You likely have a bacterial respiratory infection.  An antibiotic and inhaler were sent to your pharmacy.  Please use as directed.  Continue increased rest and fluids.  Call if your symptoms do not improve or if they worsen.  Acute Bronchitis Bronchitis is inflammation of the airways that extend from the windpipe into the lungs (bronchi). The inflammation often causes mucus to develop. This leads to a cough, which is the most common symptom of bronchitis.  In acute bronchitis, the condition usually develops suddenly and goes away over time, usually in a couple weeks. Smoking, allergies, and asthma can make bronchitis worse. Repeated episodes of bronchitis may cause further lung problems.  CAUSES Acute bronchitis is most often caused by the same virus that causes a cold. The virus can spread from person to person (contagious) through coughing, sneezing, and touching contaminated objects. SIGNS AND SYMPTOMS   Cough.   Fever.   Coughing up mucus.   Body aches.   Chest congestion.   Chills.   Shortness of breath.   Sore throat.  DIAGNOSIS  Acute bronchitis is usually diagnosed through a physical exam. Your health care provider will also ask you questions about your medical history. Tests, such as chest X-rays, are sometimes done to rule out other conditions.  TREATMENT  Acute bronchitis usually goes away in a couple weeks. Oftentimes, no medical treatment is necessary. Medicines are sometimes given for relief of fever or cough. Antibiotic medicines are usually not needed but may be prescribed in certain situations. In some cases, an inhaler may be recommended to help reduce shortness of breath and control the cough. A cool mist vaporizer may also be used to help thin bronchial secretions and make it easier to clear the chest.  HOME CARE INSTRUCTIONS  Get plenty of rest.   Drink enough fluids to keep your urine clear or pale yellow (unless you have a medical condition that  requires fluid restriction). Increasing fluids may help thin your respiratory secretions (sputum) and reduce chest congestion, and it will prevent dehydration.   Take medicines only as directed by your health care provider.  If you were prescribed an antibiotic medicine, finish it all even if you start to feel better.  Avoid smoking and secondhand smoke. Exposure to cigarette smoke or irritating chemicals will make bronchitis worse. If you are a smoker, consider using nicotine gum or skin patches to help control withdrawal symptoms. Quitting smoking will help your lungs heal faster.   Reduce the chances of another bout of acute bronchitis by washing your hands frequently, avoiding people with cold symptoms, and trying not to touch your hands to your mouth, nose, or eyes.   Keep all follow-up visits as directed by your health care provider.  SEEK MEDICAL CARE IF: Your symptoms do not improve after 1 week of treatment.  SEEK IMMEDIATE MEDICAL CARE IF:  You develop an increased fever or chills.   You have chest pain.   You have severe shortness of breath.  You have bloody sputum.   You develop dehydration.  You faint or repeatedly feel like you are going to pass out.  You develop repeated vomiting.  You develop a severe headache. MAKE SURE YOU:   Understand these instructions.  Will watch your condition.  Will get help right away if you are not doing well or get worse. Document Released: 02/01/2004 Document Revised: 05/10/2013 Document Reviewed: 06/16/2012 [facility] Patient Information 2015 [place], [place]. This information is not intended to replace advice given to you by  your health care provider. Make sure you discuss any questions you have with your health care provider.  

## 2014-10-07 NOTE — Progress Notes (Signed)
Pre visit review using our clinic review tool, if applicable. No additional management support is needed unless otherwise documented below in the visit note. 

## 2014-10-07 NOTE — Progress Notes (Signed)
   Subjective:    Patient ID: Chris Obrien, male    DOB: 02-10-1950, 64 y.o.   MRN: [id number]  HPI His symptoms started about one week ago.  Some of his upper cold symptoms (congestion, sinus pressure) have improved, but his chest congestion is not improving.  He thinks he got this infection from his grandson.   He states chest congestion, productive cough of yellow phlegm, wheeze, shortness of breath when ambulating and fatigue.     He has tried mucinex dm and benadryl and it has not helped much.   Medications and allergies reviewed with patient and updated if appropriate.   Review of Systems  Constitutional: Positive for fatigue. Negative for fever, chills and appetite change.  HENT: Negative for congestion, ear pain, sinus pressure and sore throat.   Respiratory: Positive for cough, shortness of breath and wheezing.   Cardiovascular: Negative for chest pain and palpitations.  Gastrointestinal: Negative for nausea, abdominal pain and diarrhea.  Musculoskeletal: Positive for myalgias.  Neurological: Negative for light-headedness and headaches.       Objective:   Physical Exam  Constitutional: He appears well-developed and well-nourished. No distress.  HENT:  Right Ear: External ear normal.  Left Ear: External ear normal.  Nose: Nose normal.  Mouth/Throat: Oropharynx is clear and moist. No oropharyngeal exudate.  Eyes: Conjunctivae are normal.  Neck: Neck supple.  Cardiovascular: Normal rate and regular rhythm.   No murmur heard. Pulmonary/Chest: Effort normal. No respiratory distress. He has wheezes (end expiration - diffuse).  Musculoskeletal: He exhibits no edema.  Lymphadenopathy:    He has no cervical adenopathy.  Skin: No rash noted. He is not diaphoretic.      Filed Vitals:   10/07/14 1313  BP: 126/71  Pulse: 71  Temp: 98.3 F (36.8 C)  TempSrc: Oral  Height: 6' 1.25" (1.861 m)  Weight: 243 lb 8 oz (110.451 kg)  SpO2: 95%       Assessment & Plan:

## 2015-02-20 ENCOUNTER — Other Ambulatory Visit: Payer: Self-pay | Admitting: Neurology

## 2015-02-20 DIAGNOSIS — G4700 Insomnia, unspecified: Secondary | ICD-10-CM

## 2015-02-20 DIAGNOSIS — G4730 Sleep apnea, unspecified: Secondary | ICD-10-CM

## 2015-02-20 DIAGNOSIS — G4731 Primary central sleep apnea: Secondary | ICD-10-CM

## 2015-02-20 MED ORDER — ZOLPIDEM TARTRATE 10 MG PO TABS: 5.0000 mg | ORAL_TABLET | Freq: Every evening | ORAL | Status: DC | PRN

## 2015-02-20 NOTE — Telephone Encounter (Signed)
Patient is calling to order Rx Zolpidem 10 mg and would like the medication sent to [facility] as he is getting ready to leave town.  Thanks!

## 2015-05-19 ENCOUNTER — Telehealth: Payer: Self-pay | Admitting: *Deleted

## 2015-05-19 DIAGNOSIS — G4731 Primary central sleep apnea: Secondary | ICD-10-CM

## 2015-05-19 DIAGNOSIS — G4700 Insomnia, unspecified: Secondary | ICD-10-CM

## 2015-05-19 DIAGNOSIS — G4730 Sleep apnea, unspecified: Secondary | ICD-10-CM

## 2015-05-19 MED ORDER — ZOLPIDEM TARTRATE 10 MG PO TABS: 5.0000 mg | ORAL_TABLET | Freq: Every evening | ORAL | Status: DC | PRN

## 2015-05-19 NOTE — Telephone Encounter (Signed)
Pt called, request refill for zolpidem (AMBIEN) 10 MG tablet. Please advise.

## 2015-05-19 NOTE — Telephone Encounter (Signed)
Not seen  at [facility] for 12 month, needs RV .

## 2015-05-22 NOTE — Telephone Encounter (Signed)
Attempted to call pt's cell phone number again. It does not ring.   I left a message on pt's work number asking him to call me back.

## 2015-05-22 NOTE — Telephone Encounter (Signed)
RX for [employer] faxed to [facility]. Received a receipt of confirmation.  Attempted 3 times to call pt to make an appt. Phone number never connects. Will try again later.

## 2015-05-23 NOTE — Telephone Encounter (Signed)
I tried pt's cell phone again, and it went through, but went straight to voicemail. I left a message asking him to call me back. If pt calls back, please schedule him for an office visit with Dr. Brett Fairy. Dr. Brett Fairy is requesting a 12 month RV.

## 2015-06-12 ENCOUNTER — Ambulatory Visit (INDEPENDENT_AMBULATORY_CARE_PROVIDER_SITE_OTHER)
Admission: RE | Admit: 2015-06-12 | Discharge: 2015-06-12 | Disposition: A | Discharge status: Home / Self Care | Payer: Managed Care, Other (non HMO) | Source: Ambulatory Visit | Attending: Internal Medicine | Admitting: Internal Medicine

## 2015-06-12 ENCOUNTER — Encounter: Payer: Self-pay | Admitting: Internal Medicine

## 2015-06-12 ENCOUNTER — Ambulatory Visit (INDEPENDENT_AMBULATORY_CARE_PROVIDER_SITE_OTHER): Payer: Managed Care, Other (non HMO) | Admitting: Internal Medicine

## 2015-06-12 VITALS — BP 140/84 | HR 73 | Ht 73.0 in | Wt 245.0 lb

## 2015-06-12 DIAGNOSIS — Z0000 Encounter for general adult medical examination without abnormal findings: Secondary | ICD-10-CM | POA: Diagnosis not present

## 2015-06-12 DIAGNOSIS — Z23 Encounter for immunization: Secondary | ICD-10-CM

## 2015-06-12 NOTE — Addendum Note (Signed)
Addended by: Cassandria Anger on: 06/12/2015 09:50 PM   Modules accepted: Miquel Dunn

## 2015-06-12 NOTE — Progress Notes (Signed)
Pre visit review using our clinic review tool, if applicable. No additional management support is needed unless otherwise documented below in the visit note. 

## 2015-06-12 NOTE — Progress Notes (Addendum)
Subjective:  Patient ID: Chris Obrien, male    DOB: June 14, 1950  Age: 65 y.o. MRN: [id number]  CC: Annual Exam   HPI Chris Obrien presents for a well exam L TKR Dr Alvan Dame is planned  Past Medical History  Diagnosis Date  . Benign neoplasm of colon   . Insomnia, unspecified   . Oligospermia   . Swelling of joint of right knee   . Right knee skin infection   . BPH (benign prostatic hypertrophy)     hx small stream  . Arthritis   . Acute reactive arthritis ([place]) per pcp note from 03-30-2012    right pre-patella---  secondary contact dermitis with poison ivy exposure 03-26-2012  . OSA on CPAP     PT LOST  WEIGHT - NO LONGER USING CPAP  . 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 Knee arthroscopy Right aug 2012  . Ulnar nerve repair Right 2008  . 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 Irrigation and debridement knee Right 04/02/2012    Procedure: IRRIGATION AND DEBRIDEMENT RIGHT PRE-PATELLA  ;  Surgeon: Mauri Pole, MD;  Location: [facility];  Service: Orthopedics;  Laterality: Right;  . Excisional total knee arthroplasty with antibiotic spacers Right 04/27/2012    Procedure: EXCISIONAL TOTAL KNEE ARTHROPLASTY WITH ANTIBIOTIC SPACERS;  Surgeon: Mauri Pole, MD;  Location: [facility];  Service: Orthopedics;  Laterality: Right;  SAME DAY LABS  . Total knee revision Right 06/15/2012    Procedure: reimplantation of right total knee ;  Surgeon: Mauri Pole, MD;  Location: [facility];  Service: Orthopedics;  Laterality: Right;  . Tonsillectomy      reports that he quit smoking about 7 years ago. His smoking use included Cigars. He has never used smokeless tobacco. He reports that he drinks alcohol. He  reports that he does not use illicit drugs. family history includes Alzheimer's disease in his mother; Arthritis in his father; Diabetes in his brother. Allergies  Allergen Reactions  . Vancomycin Rash    Severe redness all over body with swollen ankles and hands  . Oxycodone Rash     Outpatient Prescriptions Prior to Visit  Medication Sig Dispense Refill  . folic acid (FOLVITE) 1 MG tablet Take 1 mg by mouth every morning.     . Glucosamine-Chondroit-Vit C-Mn (GLUCOSAMINE CHONDR 1500 COMPLX) CAPS Take 1 capsule by mouth daily.    . Multiple Vitamin (MULTIVITAMIN WITH MINERALS) TABS Take 1 tablet by mouth every morning.     . tamsulosin (FLOMAX) 0.4 MG CAPS capsule Take 0.4 mg by mouth.    . zolpidem (AMBIEN) 10 MG tablet Take 0.5-1 tablets (5-10 mg total) by mouth at bedtime as needed. Can use half tab if sufficient. 30 tablet 1  . albuterol (PROVENTIL HFA;VENTOLIN HFA) 108 (90 BASE) MCG/ACT inhaler Inhale 2 puffs into the lungs every 6 (six) hours as needed for wheezing or shortness of breath. (Patient not taking: Reported on 06/12/2015) 1 Inhaler 0  . dextromethorphan-guaiFENesin (MUCINEX DM) 30-600 MG 12hr tablet Take 1 tablet by mouth 2 (two) times daily. Reported on 06/12/2015    . doxycycline (VIBRA-TABS) 100 MG tablet Take 1 tablet (100 mg total)  by mouth 2 (two) times daily. (Patient not taking: Reported on 06/12/2015) 20 tablet 0   No facility-administered medications prior to visit.    ROS Review of Systems  Constitutional: Negative for appetite change, fatigue and unexpected weight change.  HENT: Negative for congestion, nosebleeds, sneezing, sore throat and trouble swallowing.   Eyes: Negative for itching and visual disturbance.  Respiratory: Negative for cough.   Cardiovascular: Negative for chest pain, palpitations and leg swelling.  Gastrointestinal: Negative for nausea, diarrhea, blood in stool and abdominal distention.  Genitourinary: Negative for frequency and hematuria.    Musculoskeletal: Positive for arthralgias and gait problem. Negative for back pain, joint swelling and neck pain.  Skin: Negative for rash.  Neurological: Negative for dizziness, tremors, speech difficulty and weakness.  Psychiatric/Behavioral: Negative for sleep disturbance, dysphoric mood and agitation. The patient is not nervous/anxious.     Objective:  BP 140/84 mmHg  Pulse 73  Ht 6\' 1"  (1.854 m)  Wt 245 lb (111.131 kg)  BMI 32.33 kg/m2  SpO2 94%  BP Readings from Last 3 Encounters:  06/12/15 140/84  10/07/14 126/71  02/14/14 170/97    Wt Readings from Last 3 Encounters:  06/12/15 245 lb (111.131 kg)  10/07/14 243 lb 8 oz (110.451 kg)  02/14/14 242 lb (109.77 kg)    Physical Exam  Constitutional: He is oriented to person, place, and time. He appears well-developed. No distress.  NAD  HENT:  Mouth/Throat: Oropharynx is clear and moist.  Eyes: Conjunctivae are normal. Pupils are equal, round, and reactive to light.  Neck: Normal range of motion. No JVD present. No thyromegaly present.  Cardiovascular: Normal rate, regular rhythm, normal heart sounds and intact distal pulses.  Exam reveals no gallop and no friction rub.   No murmur heard. Pulmonary/Chest: Effort normal and breath sounds normal. No respiratory distress. He has no wheezes. He has no rales. He exhibits no tenderness.  Abdominal: Soft. Bowel sounds are normal. He exhibits no distension and no mass. There is no tenderness. There is no rebound and no guarding.  Genitourinary: Rectum normal and prostate normal.  Musculoskeletal: Normal range of motion. He exhibits tenderness. He exhibits no edema.  Lymphadenopathy:    He has no cervical adenopathy.  Neurological: He is alert and oriented to person, place, and time. He has normal reflexes. No cranial nerve deficit. He exhibits normal muscle tone. He displays a negative Romberg sign. Coordination and gait normal.  Skin: Skin is warm and dry. No rash noted.   Psychiatric: He has a normal mood and affect. His behavior is normal. Judgment and thought content normal.  L knee is tender  Lab Results  Component Value Date   WBC 5.2 12/21/2013   HGB 15.6 12/21/2013   HCT 46.0 12/21/2013   PLT 266.0 12/21/2013   GLUCOSE 108* 12/21/2013   CHOL 173 12/21/2013   TRIG 71.0 12/21/2013   HDL 50.50 12/21/2013   LDLDIRECT 130.9 12/08/2012   LDLCALC 108* 12/21/2013   ALT 19 12/21/2013   AST 16 12/21/2013   NA 139 12/21/2013   K 4.7 12/21/2013   CL 110 12/21/2013   CREATININE 1.0 12/21/2013   BUN 24* 12/21/2013   CO2 25 12/21/2013   TSH 2.08 12/21/2013   PSA 1.95 12/21/2013   INR 1.09 06/03/2012   HGBA1C 5.4 12/21/2013    No results found.  Assessment & Plan:   There are no diagnoses linked to this encounter. I have discontinued Mr. Rampley dextromethorphan-guaiFENesin, doxycycline, and albuterol. I am also  having him maintain his folic acid, multivitamin with minerals, GLUCOSAMINE CHONDR 1500 COMPLX, tamsulosin, and zolpidem.  No orders of the defined types were placed in this encounter.     Follow-up: No Follow-up on file.  Walker Kehr, MD

## 2015-06-12 NOTE — Assessment & Plan Note (Addendum)
We discussed age appropriate health related issues, including available/recomended screening tests and vaccinations. We discussed a need for adhering to healthy diet and exercise. Labs/EKG were reviewed/ordered. All questions were answered. The pt ismedically  clear for a TKR w/Dr Alvan Dame. He will get an EKG at his pre-op exam

## 2015-06-13 ENCOUNTER — Other Ambulatory Visit (INDEPENDENT_AMBULATORY_CARE_PROVIDER_SITE_OTHER): Payer: Managed Care, Other (non HMO)

## 2015-06-13 DIAGNOSIS — Z0000 Encounter for general adult medical examination without abnormal findings: Secondary | ICD-10-CM

## 2015-06-13 LAB — HEPATIC FUNCTION PANEL
ALT: 17 U/L (ref 0–53)
AST: 14 U/L (ref 0–37)
Albumin: 4 g/dL (ref 3.5–5.2)
Alkaline Phosphatase: 54 U/L (ref 39–117)
Bilirubin, Direct: 0.1 mg/dL (ref 0.0–0.3)
Total Bilirubin: 0.5 mg/dL (ref 0.2–1.2)
Total Protein: 5.7 g/dL — ABNORMAL LOW (ref 6.0–8.3)

## 2015-06-13 LAB — URINALYSIS
Bilirubin Urine: NEGATIVE
Hgb urine dipstick: NEGATIVE
Ketones, ur: NEGATIVE
Leukocytes, UA: NEGATIVE
Nitrite: NEGATIVE
Specific Gravity, Urine: 1.025 (ref 1.000–1.030)
Total Protein, Urine: NEGATIVE
Urine Glucose: NEGATIVE
Urobilinogen, UA: 0.2 (ref 0.0–1.0)
pH: 5.5 (ref 5.0–8.0)

## 2015-06-13 LAB — BASIC METABOLIC PANEL
BUN: 19 mg/dL (ref 6–23)
CO2: 29 mEq/L (ref 19–32)
Calcium: 8.8 mg/dL (ref 8.4–10.5)
Chloride: 108 mEq/L (ref 96–112)
Creatinine, Ser: 1.02 mg/dL (ref 0.40–1.50)
GFR: 77.43 mL/min (ref >60.00)
Glucose, Bld: 88 mg/dL (ref 70–99)
Potassium: 4.6 mEq/L (ref 3.5–5.1)
Sodium: 143 mEq/L (ref 135–145)

## 2015-06-13 LAB — CBC WITH DIFFERENTIAL/PLATELET
Basophils Absolute: 0 10*3/uL (ref 0.0–0.1)
Basophils Relative: 0.4 % (ref 0.0–3.0)
Eosinophils Absolute: 0.4 10*3/uL (ref 0.0–0.7)
Eosinophils Relative: 6 % — ABNORMAL HIGH (ref 0.0–5.0)
HCT: 47.2 % (ref 39.0–52.0)
Hemoglobin: 16 g/dL (ref 13.0–17.0)
Lymphocytes Relative: 27.8 % (ref 12.0–46.0)
Lymphs Abs: 1.7 10*3/uL (ref 0.7–4.0)
MCHC: 33.9 g/dL (ref 30.0–36.0)
MCV: 89.4 fl (ref 78.0–100.0)
Monocytes Absolute: 0.5 10*3/uL (ref 0.1–1.0)
Monocytes Relative: 8 % (ref 3.0–12.0)
Neutro Abs: 3.5 10*3/uL (ref 1.4–7.7)
Neutrophils Relative %: 57.8 % (ref 43.0–77.0)
Platelets: 278 10*3/uL (ref 150.0–400.0)
RBC: 5.28 Mil/uL (ref 4.22–5.81)
RDW: 13.9 % (ref 11.5–15.5)
WBC: 6 10*3/uL (ref 4.0–10.5)

## 2015-06-13 LAB — TSH: TSH: 1.07 u[IU]/mL (ref 0.35–4.50)

## 2015-06-13 LAB — LIPID PANEL
Cholesterol: 187 mg/dL (ref 0–200)
HDL: 55.5 mg/dL (ref >39.00)
LDL Cholesterol: 116 mg/dL — ABNORMAL HIGH (ref 0–99)
NonHDL: 131.16
Total CHOL/HDL Ratio: 3
Triglycerides: 78 mg/dL (ref 0.0–149.0)
VLDL: 15.6 mg/dL (ref 0.0–40.0)

## 2015-06-13 LAB — PSA: PSA: 2.83 ng/mL (ref 0.10–4.00)

## 2015-06-14 LAB — HEPATITIS C ANTIBODY: HCV Ab: NEGATIVE

## 2015-06-15 NOTE — Progress Notes (Signed)
Per patient "Dr Alvan Dame stated he was ok with the labs done 06/13/15-only 11 weeks old".  I LM with Orson Slick at [facility] to please have provider put order in epic to that effect. Also faxed this note to Dr Susa Day

## 2015-07-04 NOTE — Patient Instructions (Signed)
Chris Obrien  07/04/2015   Your procedure is scheduled on: 07/17/2015   Report to [facility] Main  Entrance take [place]  elevators to 3rd floor to  [place] at    [place] AM.  Call this number if you have problems the morning of surgery [phone]   Remember: ONLY 1 PERSON MAY GO WITH YOU TO SHORT STAY TO GET  READY MORNING OF [place].  Do not eat food or drink liquids :After Midnight.     Take these medicines the morning of surgery with A SIP OF WATER: Flomax                                 You may not have any metal on your body including hair pins and              piercings  Do not wear jewelry,  lotions, powders or perfumes, deodorant             Do not wear nail polish.  Do not shave  48 hours prior to surgery.         Men may shave face and neck.   Do not bring valuables to the hospital. [place].  Contacts, dentures or bridgework may not be worn into surgery.  Leave suitcase in the car. After surgery it may be brought to your room.       Special Instructions: coughing and deep breathing exercises, leg exercises               Please read over the following fact sheets you were given: _____________________________________________________________________             [facility] - Preparing for Surgery Before surgery, you can play an important role.  Because skin is not sterile, your skin needs to be as free of germs as possible.  You can reduce the number of germs on your skin by washing with CHG (chlorahexidine gluconate) soap before surgery.  CHG is an antiseptic cleaner which kills germs and bonds with the skin to continue killing germs even after washing. Please DO NOT use if you have an allergy to CHG or antibacterial soaps.  If your skin becomes reddened/irritated stop using the CHG and inform your nurse when you arrive at Short Stay. Do not shave (including legs and underarms) for  at least 48 hours prior to the first CHG shower.  You may shave your face/neck. Please follow these instructions carefully:  1.  Shower with CHG Soap the night before surgery and the  morning of Surgery.  2.  If you choose to wash your hair, wash your hair first as usual with your  normal  shampoo.  3.  After you shampoo, rinse your hair and body thoroughly to remove the  shampoo.                           4.  Use CHG as you would any other liquid soap.  You can apply chg directly  to the skin and wash                       Gently with  a scrungie or clean washcloth.  5.  Apply the CHG Soap to your body ONLY FROM THE NECK DOWN.   Do not use on face/ open                           Wound or open sores. Avoid contact with eyes, ears mouth and genitals (private parts).                       Wash face,  Genitals (private parts) with your normal soap.             6.  Wash thoroughly, paying special attention to the area where your surgery  will be performed.  7.  Thoroughly rinse your body with warm water from the neck down.  8.  DO NOT shower/wash with your normal soap after using and rinsing off  the CHG Soap.                9.  Pat yourself dry with a clean towel.            10.  Wear clean pajamas.            11.  Place clean sheets on your bed the night of your first shower and do not  sleep with pets. Day of Surgery : Do not apply any lotions/deodorants the morning of surgery.  Please wear clean clothes to the hospital/surgery center.  FAILURE TO FOLLOW THESE INSTRUCTIONS MAY RESULT IN THE CANCELLATION OF YOUR SURGERY PATIENT SIGNATURE_________________________________  NURSE SIGNATURE__________________________________  ________________________________________________________________________  WHAT IS A BLOOD TRANSFUSION? Blood Transfusion Information  A transfusion is the replacement of blood or some of its parts. Blood is made up of multiple cells which provide different functions.  Red  blood cells carry oxygen and are used for blood loss replacement.  White blood cells fight against infection.  Platelets control bleeding.  Plasma helps clot blood.  Other blood products are available for specialized needs, such as hemophilia or other clotting disorders. BEFORE THE TRANSFUSION  Who gives blood for transfusions?   Healthy volunteers who are fully evaluated to make sure their blood is safe. This is blood bank blood. Transfusion therapy is the safest it has ever been in the practice of medicine. Before blood is taken from a donor, a complete history is taken to make sure that person has no history of diseases nor engages in risky social behavior (examples are intravenous drug use or sexual activity with multiple partners). The donor's travel history is screened to minimize risk of transmitting infections, such as malaria. The donated blood is tested for signs of infectious diseases, such as HIV and hepatitis. The blood is then tested to be sure it is compatible with you in order to minimize the chance of a transfusion reaction. If you or a relative donates blood, this is often done in anticipation of surgery and is not appropriate for emergency situations. It takes many days to process the donated blood. RISKS AND COMPLICATIONS Although transfusion therapy is very safe and saves many lives, the main dangers of transfusion include:  1. Getting an infectious disease. 2. Developing a transfusion reaction. This is an allergic reaction to something in the blood you were given. Every precaution is taken to prevent this. The decision to have a blood transfusion has been considered carefully by your caregiver before blood is given. Blood is not given unless the benefits outweigh the risks.  AFTER THE TRANSFUSION  Right after receiving a blood transfusion, you will usually feel much better and more energetic. This is especially true if your red blood cells have gotten low (anemic). The  transfusion raises the level of the red blood cells which carry oxygen, and this usually causes an energy increase.  The nurse administering the transfusion will monitor you carefully for complications. HOME CARE INSTRUCTIONS  No special instructions are needed after a transfusion. You may find your energy is better. Speak with your caregiver about any limitations on activity for underlying diseases you may have. SEEK MEDICAL CARE IF:   Your condition is not improving after your transfusion.  You develop redness or irritation at the intravenous (IV) site. SEEK IMMEDIATE MEDICAL CARE IF:  Any of the following symptoms occur over the next 12 hours:  Shaking chills.  You have a temperature by mouth above 102 F (38.9 C), not controlled by medicine.  Chest, back, or muscle pain.  People around you feel you are not acting correctly or are confused.  Shortness of breath or difficulty breathing.  Dizziness and fainting.  You get a rash or develop hives.  You have a decrease in urine output.  Your urine turns a dark color or changes to pink, red, or brown. Any of the following symptoms occur over the next 10 days:  You have a temperature by mouth above 102 F (38.9 C), not controlled by medicine.  Shortness of breath.  Weakness after normal activity.  The white part of the eye turns yellow (jaundice).  You have a decrease in the amount of urine or are urinating less often.  Your urine turns a dark color or changes to pink, red, or brown. Document Released: 12/22/1999 Document Revised: 03/18/2011 Document Reviewed: 08/10/2007 ExitCare Patient Information 2014 [place].  _______________________________________________________________________  Incentive Spirometer  An incentive spirometer is a tool that can help keep your lungs clear and active. This tool measures how well you are filling your lungs with each breath. Taking long deep breaths may help reverse or  decrease the chance of developing breathing (pulmonary) problems (especially infection) following:  A long period of time when you are unable to move or be active. BEFORE THE PROCEDURE   If the spirometer includes an indicator to show your best effort, your nurse or respiratory therapist will set it to a desired goal.  If possible, sit up straight or lean slightly forward. Try not to slouch.  Hold the incentive spirometer in an upright position. INSTRUCTIONS FOR USE  3. Sit on the edge of your bed if possible, or sit up as far as you can in bed or on a chair. 4. Hold the incentive spirometer in an upright position. 5. Breathe out normally. 6. Place the mouthpiece in your mouth and seal your lips tightly around it. 7. Breathe in slowly and as deeply as possible, raising the piston or the ball toward the top of the column. 8. Hold your breath for 3-5 seconds or for as long as possible. Allow the piston or ball to fall to the bottom of the column. 9. Remove the mouthpiece from your mouth and breathe out normally. 10. Rest for a few seconds and repeat Steps 1 through 7 at least 10 times every 1-2 hours when you are awake. Take your time and take a few normal breaths between deep breaths. 11. The spirometer may include an indicator to show your best effort. Use the indicator as a goal to work toward during  each repetition. 12. After each set of 10 deep breaths, practice coughing to be sure your lungs are clear. If you have an incision (the cut made at the time of surgery), support your incision when coughing by placing a pillow or rolled up towels firmly against it. Once you are able to get out of bed, walk around indoors and cough well. You may stop using the incentive spirometer when instructed by your caregiver.  RISKS AND COMPLICATIONS  Take your time so you do not get dizzy or light-headed.  If you are in pain, you may need to take or ask for pain medication before doing incentive  spirometry. It is harder to take a deep breath if you are having pain. AFTER USE  Rest and breathe slowly and easily.  It can be helpful to keep track of a log of your progress. Your caregiver can provide you with a simple table to help with this. If you are using the spirometer at home, follow these instructions: [place] IF:   You are having difficultly using the spirometer.  You have trouble using the spirometer as often as instructed.  Your pain medication is not giving enough relief while using the spirometer.  You develop fever of 100.5 F (38.1 C) or higher. SEEK IMMEDIATE MEDICAL CARE IF:   You cough up bloody sputum that had not been present before.  You develop fever of 102 F (38.9 C) or greater.  You develop worsening pain at or near the incision site. MAKE SURE YOU:   Understand these instructions.  Will watch your condition.  Will get help right away if you are not doing well or get worse. Document Released: 05/06/2006 Document Revised: 03/18/2011 Document Reviewed: 07/07/2006 [facility] Patient Information 2014 [place], [place].   ________________________________________________________________________

## 2015-07-05 ENCOUNTER — Encounter (HOSPITAL_COMMUNITY)
Admission: RE | Admit: 2015-07-05 | Discharge: 2015-07-05 | Disposition: A | Discharge status: Home / Self Care | Payer: Managed Care, Other (non HMO) | Source: Ambulatory Visit | Attending: Orthopedic Surgery | Admitting: Orthopedic Surgery

## 2015-07-05 ENCOUNTER — Encounter (HOSPITAL_COMMUNITY): Payer: Self-pay

## 2015-07-05 DIAGNOSIS — Z0183 Encounter for blood typing: Secondary | ICD-10-CM | POA: Insufficient documentation

## 2015-07-05 DIAGNOSIS — Z01812 Encounter for preprocedural laboratory examination: Secondary | ICD-10-CM | POA: Insufficient documentation

## 2015-07-05 DIAGNOSIS — M1712 Unilateral primary osteoarthritis, left knee: Secondary | ICD-10-CM | POA: Insufficient documentation

## 2015-07-05 LAB — SURGICAL PCR SCREEN
MRSA, PCR: NEGATIVE
Staphylococcus aureus: NEGATIVE

## 2015-07-05 NOTE — Progress Notes (Signed)
Labs of CBC, CMP and U/A done 06/13/2015 in EPIC .  Per order of Dr Alvan Dame labs not to be repeated  Order in [facility].   Clearance- Dr Plotnikov-06/12/15- EPIC - LOV note and on chart.

## 2015-07-05 NOTE — H&P (Signed)
Chris KNEE ADMISSION H&P  Patient is being admitted for left Chris knee arthroplasty.  Subjective:  Chief Complaint:   Left knee primary OA / pain  HPI: Chris Obrien, 65 y.o. male, has a history of pain and functional disability in the left knee due to arthritis and has failed non-surgical conservative treatments for greater than 12 weeks to include NSAID's and/or analgesics, corticosteriod injections and activity modification.  Onset of symptoms was gradual, starting >10 years ago with gradually worsening course since that time. The patient noted prior procedures on the knee to include  arthroplasty on the right knee(s).  Patient currently rates pain in the left knee(s) at 10 out of 10 with activity. Patient has night pain, worsening of pain with activity and weight bearing, pain that interferes with activities of daily living, pain with passive range of motion, crepitus and joint swelling.  Patient has evidence of periarticular osteophytes and joint space narrowing by imaging studies.  There is no active infection.   Risks, benefits and expectations were discussed with the patient.  Risks including but not limited to the risk of anesthesia, blood clots, nerve damage, blood vessel damage, failure of the prosthesis, infection and up to and including death.  Patient understand the risks, benefits and expectations and wishes to proceed with surgery.   PCP: Walker Kehr, MD  D/C Plans:      Home with HHPT  Post-op Meds:       No Rx given  Tranexamic Acid:      To be given - IV   Decadron:      Is to be given  FYI:     ASA  Norco  CPAP    Patient Active Problem List   Diagnosis Date Noted  . Acute bronchitis 10/07/2014  . Wheezing 10/07/2014  . Obstructive sleep apnea syndrome 04/28/2014  . 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 OSA (obstructive sleep apnea) 02/14/2014  . Benign paroxysmal positional vertigo 01/27/2014  . Cold sore 01/27/2014  . Elevated BP 01/27/2014  .  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 Pain in joint, shoulder region 12/07/2012  . S/P right TK revision 06/15/2012  . Cervical radiculopathy 05/07/2012  . Obesity (BMI 30-39.9) 05/03/2012  . Normocytic anemia 04/28/2012  . S/P Right knee removal of prosthesis with antibiotic spacer 04/27/2012  . Rash and nonspecific skin eruption 04/25/2012  . Right reactive prepatellar bursitis 04/02/2012  . Expected blood loss anemia 01/29/2012  . Obesity (BMI 30.0-34.9) 01/29/2012  . S/P right TKA 01/28/2012  . Well adult exam 09/24/2011  . Knee pain 09/24/2011  . Obstructive sleep apnea 02/01/2008  . SNORING 11/19/2007  . DYSPNEA 07/29/2007  . CHEST PAIN, UNSPECIFIED 07/29/2007  . POLYP, COLON 05/30/2007  . IRREGULAR HEART RATE 05/30/2007  . INSOMNIA 05/30/2007  . BENIGN PROSTATIC HYPERTROPHY, HX OF 05/30/2007  . ARTHROSCOPY, KNEE, HX OF 05/30/2007   Past Medical History  Diagnosis Date  . Benign neoplasm of colon   . Insomnia, unspecified   . Oligospermia   . Swelling of joint of right knee   . Right knee skin infection   . BPH (benign prostatic hypertrophy)     hx small stream  . Arthritis   . Acute reactive arthritis ([place]) per pcp note from 03-30-2012    right pre-patella---  secondary contact dermitis with poison ivy exposure 03-26-2012  . OSA on CPAP     PT LOST  WEIGHT - NO LONGER USING CPAP  . Pain     RIGHT  SHOULDER DELTOID MUSCLE ATROPHY - LOSS OF STRENGT VERY LIMITED RANGE OF MOTTION  . 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 Knee arthroscopy Right aug 2012  . Ulnar nerve repair Right 2008  . Chris knee arthroplasty  01/28/2012    Procedure: Chris KNEE ARTHROPLASTY;  Surgeon: Mauri Pole, MD;  Location: [facility];  Service: Orthopedics;  Laterality: Right;  . Irrigation and debridement knee Right 04/02/2012    Procedure: IRRIGATION AND DEBRIDEMENT RIGHT PRE-PATELLA  ;  Surgeon: Mauri Pole, MD;  Location: [place];  Service: Orthopedics;  Laterality: Right;  . Excisional Chris knee arthroplasty with antibiotic spacers Right 04/27/2012    Procedure: EXCISIONAL Chris KNEE ARTHROPLASTY WITH ANTIBIOTIC SPACERS;  Surgeon: Mauri Pole, MD;  Location: [facility];  Service: Orthopedics;  Laterality: Right;  SAME DAY LABS  . Chris knee revision Right 06/15/2012    Procedure: reimplantation of right Chris knee ;  Surgeon: Mauri Pole, MD;  Location: [facility];  Service: Orthopedics;  Laterality: Right;  . Tonsillectomy      No prescriptions prior to admission   Allergies  Allergen Reactions  . Vancomycin Rash    Severe redness all over body with swollen ankles and hands  . Oxycodone Rash    Social History  Substance Use Topics  . Smoking status: Former Smoker    Types: Cigars    Quit date: 01/08/2008  . Smokeless tobacco: Never Used  . Alcohol Use: Yes     Comment: occasionally    Family History  Problem Relation Age of Onset  . Hyperlipidemia    . Alzheimer's disease Mother   . Arthritis Father   . Diabetes Brother      Review of Systems  Constitutional: Negative.   HENT: Negative.   Eyes: Negative.   Respiratory: Negative.   Cardiovascular: Negative.   Gastrointestinal: Negative.   Genitourinary: Negative.   Musculoskeletal: Positive for joint pain.  Skin: Negative.   Neurological: Negative.   Endo/Heme/Allergies: Negative.   Psychiatric/Behavioral: The patient has insomnia.     Objective:  Physical Exam  Constitutional: He is oriented to person, place, and time. He appears well-developed.  HENT:  Head: Normocephalic.  Eyes: Pupils are equal, round, and reactive to light.  Neck: Neck supple. No JVD present. No tracheal deviation present. No thyromegaly present.  Cardiovascular: Normal rate, regular rhythm, normal heart sounds and intact distal pulses.   Respiratory: Effort normal and breath sounds normal. No stridor. No respiratory distress. He has no wheezes.  GI: Soft.  There is no tenderness. There is no guarding.  Musculoskeletal:       Left knee: He exhibits decreased range of motion, swelling and bony tenderness. He exhibits no ecchymosis, no deformity, no laceration, no erythema and normal alignment. Tenderness found.  Lymphadenopathy:    He has no cervical adenopathy.  Neurological: He is alert and oriented to person, place, and time.  Skin: Skin is warm and dry.  Psychiatric: He has a normal mood and affect.    Vital signs in last 24 hours: Temp:  [98.7 F (37.1 C)] 98.7 F (37.1 C) (06/28 0841) Pulse Rate:  [70] 70 (06/28 0841) Resp:  [16] 16 (06/28 0841) BP: (181-201)/(98-100) 181/98 mmHg (06/28 0842) SpO2:  [96 %] 96 % (06/28 0841) Weight:  [109.317 kg (241 lb)] 109.317 kg (241 lb) (06/28 0841)  Labs:   Estimated body mass index is 31.89 kg/(m^2) as calculated from the  following:   Height as of 10/07/14: 6' 1.25" (1.861 m).   Weight as of 10/07/14: 110.451 kg (243 lb 8 oz).   Imaging Review Plain radiographs demonstrate severe degenerative joint disease of the left knee(s). The overall alignment is neutral. The bone quality appears to be good for age and reported activity level.  Assessment/Plan:  End stage arthritis, left knee   The patient history, physical examination, clinical judgment of the provider and imaging studies are consistent with end stage degenerative joint disease of the left knee(s) and Chris knee arthroplasty is deemed medically necessary. The treatment options including medical management, injection therapy arthroscopy and arthroplasty were discussed at length. The risks and benefits of Chris knee arthroplasty were presented and reviewed. The risks due to aseptic loosening, infection, stiffness, patella tracking problems, thromboembolic complications and other imponderables were discussed. The patient acknowledged the explanation, agreed to proceed with the plan and consent was signed. Patient is being admitted for  inpatient treatment for surgery, pain control, PT, OT, prophylactic antibiotics, VTE prophylaxis, progressive ambulation and ADL's and discharge planning. The patient is planning to be discharged home with home health services.      [place] Emmit Oriley   PA-C  07/05/2015, 2:28 PM

## 2015-07-05 NOTE — Progress Notes (Signed)
Patient reports having a blood pressure monitor at home and will monitor blood pressure today.  Patient reports having an appointment with Dr Alvan Dame at 200pm on 07/05/15.

## 2015-07-05 NOTE — Progress Notes (Signed)
Blood pressure at preop visit was initially 201/100.  Patient denies any complaints.  Recheck at end of preop appointment blood pressure was 181/98.  Patient is no on blood pressure medications.  Patient reports he did take a Benadryl this am.  FYI.

## 2015-07-17 ENCOUNTER — Inpatient Hospital Stay (HOSPITAL_COMMUNITY): Payer: Managed Care, Other (non HMO) | Admitting: Certified Registered"

## 2015-07-17 ENCOUNTER — Encounter (HOSPITAL_COMMUNITY)
Admission: RE | Disposition: A | Discharge status: Home Health | Payer: Self-pay | Source: Ambulatory Visit | Attending: Orthopedic Surgery

## 2015-07-17 ENCOUNTER — Encounter (HOSPITAL_COMMUNITY): Payer: Self-pay | Admitting: *Deleted

## 2015-07-17 ENCOUNTER — Inpatient Hospital Stay (HOSPITAL_COMMUNITY)
Admission: RE | Admit: 2015-07-17 | Discharge: 2015-07-18 | DRG: 470 | Disposition: A | Discharge status: Home Health | Payer: Managed Care, Other (non HMO) | Source: Ambulatory Visit | Attending: Orthopedic Surgery | Admitting: Orthopedic Surgery

## 2015-07-17 DIAGNOSIS — G4700 Insomnia, unspecified: Secondary | ICD-10-CM | POA: Diagnosis present

## 2015-07-17 DIAGNOSIS — Z96652 Presence of left artificial knee joint: Secondary | ICD-10-CM

## 2015-07-17 DIAGNOSIS — M1712 Unilateral primary osteoarthritis, left knee: Principal | ICD-10-CM | POA: Diagnosis present

## 2015-07-17 DIAGNOSIS — Z6830 Body mass index (BMI) 30.0-30.9, adult: Secondary | ICD-10-CM | POA: Diagnosis not present

## 2015-07-17 DIAGNOSIS — M659 Synovitis and tenosynovitis, unspecified: Secondary | ICD-10-CM | POA: Diagnosis present

## 2015-07-17 DIAGNOSIS — Z96659 Presence of unspecified artificial knee joint: Secondary | ICD-10-CM

## 2015-07-17 DIAGNOSIS — Z87891 Personal history of nicotine dependence: Secondary | ICD-10-CM | POA: Diagnosis not present

## 2015-07-17 DIAGNOSIS — Z8261 Family history of arthritis: Secondary | ICD-10-CM | POA: Diagnosis not present

## 2015-07-17 DIAGNOSIS — Z96651 Presence of right artificial knee joint: Secondary | ICD-10-CM | POA: Diagnosis present

## 2015-07-17 DIAGNOSIS — E669 Obesity, unspecified: Secondary | ICD-10-CM | POA: Diagnosis present

## 2015-07-17 DIAGNOSIS — M25562 Pain in left knee: Secondary | ICD-10-CM | POA: Diagnosis present

## 2015-07-17 HISTORY — PX: TOTAL KNEE ARTHROPLASTY: SHX125

## 2015-07-17 LAB — TYPE AND SCREEN
ABO/RH(D): A POS
Antibody Screen: NEGATIVE

## 2015-07-17 SURGERY — ARTHROPLASTY, KNEE, TOTAL
Anesthesia: Monitor Anesthesia Care | Laterality: Left

## 2015-07-17 MED ORDER — POLYETHYLENE GLYCOL 3350 17 G PO PACK
17.0000 g | PACK | Freq: Two times a day (BID) | ORAL | Status: DC
  Administered 2015-07-17 – 2015-07-18 (×2): 17 g via ORAL
  Filled 2015-07-17 (×2): qty 1

## 2015-07-17 MED ORDER — FENTANYL CITRATE (PF) 100 MCG/2ML IJ SOLN
INTRAMUSCULAR | Status: AC
  Filled 2015-07-17: qty 2

## 2015-07-17 MED ORDER — DEXAMETHASONE SODIUM PHOSPHATE 10 MG/ML IJ SOLN
INTRAMUSCULAR | Status: AC
  Filled 2015-07-17: qty 1

## 2015-07-17 MED ORDER — BUPIVACAINE IN DEXTROSE 0.75-8.25 % IT SOLN
INTRATHECAL | Status: DC | PRN
  Administered 2015-07-17: 2 mL via INTRATHECAL

## 2015-07-17 MED ORDER — DOCUSATE SODIUM 100 MG PO CAPS
100.0000 mg | ORAL_CAPSULE | Freq: Two times a day (BID) | ORAL | Status: DC
  Administered 2015-07-17 – 2015-07-18 (×2): 100 mg via ORAL
  Filled 2015-07-17 (×2): qty 1

## 2015-07-17 MED ORDER — PHENOL 1.4 % MT LIQD: 1.0000 | OROMUCOSAL | Status: DC | PRN

## 2015-07-17 MED ORDER — BUPIVACAINE HCL (PF) 0.25 % IJ SOLN
INTRAMUSCULAR | Status: AC
  Filled 2015-07-17: qty 30

## 2015-07-17 MED ORDER — MENTHOL 3 MG MT LOZG: 1.0000 | LOZENGE | OROMUCOSAL | Status: DC | PRN

## 2015-07-17 MED ORDER — TRANEXAMIC ACID 1000 MG/10ML IV SOLN
1000.0000 mg | Freq: Once | INTRAVENOUS | Status: AC
  Administered 2015-07-17: 1000 mg via INTRAVENOUS
  Filled 2015-07-17: qty 10

## 2015-07-17 MED ORDER — KETOROLAC TROMETHAMINE 30 MG/ML IJ SOLN
INTRAMUSCULAR | Status: AC
  Filled 2015-07-17: qty 1

## 2015-07-17 MED ORDER — DEXAMETHASONE SODIUM PHOSPHATE 10 MG/ML IJ SOLN
10.0000 mg | Freq: Once | INTRAMUSCULAR | Status: AC
  Administered 2015-07-18: 10 mg via INTRAVENOUS
  Filled 2015-07-17: qty 1

## 2015-07-17 MED ORDER — LIDOCAINE HCL (CARDIAC) 20 MG/ML IV SOLN
INTRAVENOUS | Status: DC | PRN
  Administered 2015-07-17: 50 mg via INTRAVENOUS

## 2015-07-17 MED ORDER — HYDROMORPHONE HCL 1 MG/ML IJ SOLN: 0.5000 mg | INTRAMUSCULAR | Status: DC | PRN

## 2015-07-17 MED ORDER — CEFAZOLIN SODIUM-DEXTROSE 2-4 GM/100ML-% IV SOLN
2.0000 g | INTRAVENOUS | Status: AC
Start: 2015-07-17 — End: 2015-07-17
  Administered 2015-07-17: 2 g via INTRAVENOUS

## 2015-07-17 MED ORDER — SODIUM CHLORIDE 0.9 % IJ SOLN
INTRAMUSCULAR | Status: DC | PRN
  Administered 2015-07-17: 30 mL

## 2015-07-17 MED ORDER — CEFAZOLIN SODIUM-DEXTROSE 2-4 GM/100ML-% IV SOLN
2.0000 g | Freq: Four times a day (QID) | INTRAVENOUS | Status: AC
  Administered 2015-07-17 (×2): 2 g via INTRAVENOUS
  Filled 2015-07-17 (×2): qty 100

## 2015-07-17 MED ORDER — MAGNESIUM CITRATE PO SOLN: 1.0000 | Freq: Once | ORAL | Status: DC | PRN

## 2015-07-17 MED ORDER — PROPOFOL 10 MG/ML IV BOLUS
INTRAVENOUS | Status: AC
  Filled 2015-07-17: qty 20

## 2015-07-17 MED ORDER — SODIUM CHLORIDE 0.9 % IV SOLN
INTRAVENOUS | Status: DC
  Administered 2015-07-17 – 2015-07-18 (×2): via INTRAVENOUS
  Filled 2015-07-17 (×4): qty 1000

## 2015-07-17 MED ORDER — METOCLOPRAMIDE HCL 5 MG/ML IJ SOLN: 5.0000 mg | Freq: Three times a day (TID) | INTRAMUSCULAR | Status: DC | PRN

## 2015-07-17 MED ORDER — ASPIRIN EC 325 MG PO TBEC
325.0000 mg | DELAYED_RELEASE_TABLET | Freq: Two times a day (BID) | ORAL | Status: DC
  Administered 2015-07-18: 325 mg via ORAL
  Filled 2015-07-17: qty 1

## 2015-07-17 MED ORDER — TAMSULOSIN HCL 0.4 MG PO CAPS
0.4000 mg | ORAL_CAPSULE | Freq: Every day | ORAL | Status: DC
  Administered 2015-07-17 – 2015-07-18 (×2): 0.4 mg via ORAL
  Filled 2015-07-17 (×2): qty 1

## 2015-07-17 MED ORDER — DEXAMETHASONE SODIUM PHOSPHATE 10 MG/ML IJ SOLN
10.0000 mg | Freq: Once | INTRAMUSCULAR | Status: AC
  Administered 2015-07-17: 10 mg via INTRAVENOUS

## 2015-07-17 MED ORDER — ZOLPIDEM TARTRATE 5 MG PO TABS
5.0000 mg | ORAL_TABLET | Freq: Every evening | ORAL | Status: DC | PRN
  Administered 2015-07-17: 5 mg via ORAL
  Filled 2015-07-17: qty 1

## 2015-07-17 MED ORDER — SODIUM CHLORIDE 0.9 % IJ SOLN
INTRAMUSCULAR | Status: AC
  Filled 2015-07-17: qty 50

## 2015-07-17 MED ORDER — KETOROLAC TROMETHAMINE 30 MG/ML IJ SOLN
INTRAMUSCULAR | Status: DC | PRN
  Administered 2015-07-17: 30 mg

## 2015-07-17 MED ORDER — BUPIVACAINE-EPINEPHRINE 0.25% -1:200000 IJ SOLN
INTRAMUSCULAR | Status: AC
  Filled 2015-07-17: qty 1

## 2015-07-17 MED ORDER — CHLORHEXIDINE GLUCONATE 4 % EX LIQD: 60.0000 mL | Freq: Once | CUTANEOUS | Status: DC

## 2015-07-17 MED ORDER — MIDAZOLAM HCL 2 MG/2ML IJ SOLN
INTRAMUSCULAR | Status: AC
  Filled 2015-07-17: qty 2

## 2015-07-17 MED ORDER — CELECOXIB 200 MG PO CAPS
200.0000 mg | ORAL_CAPSULE | Freq: Two times a day (BID) | ORAL | Status: DC
  Administered 2015-07-17 – 2015-07-18 (×3): 200 mg via ORAL
  Filled 2015-07-17 (×3): qty 1

## 2015-07-17 MED ORDER — ALUM & MAG HYDROXIDE-SIMETH 200-200-20 MG/5ML PO SUSP: 30.0000 mL | ORAL | Status: DC | PRN

## 2015-07-17 MED ORDER — METOCLOPRAMIDE HCL 5 MG PO TABS: 5.0000 mg | ORAL_TABLET | Freq: Three times a day (TID) | ORAL | Status: DC | PRN

## 2015-07-17 MED ORDER — ONDANSETRON HCL 4 MG PO TABS: 4.0000 mg | ORAL_TABLET | Freq: Four times a day (QID) | ORAL | Status: DC | PRN

## 2015-07-17 MED ORDER — FERROUS SULFATE 325 (65 FE) MG PO TABS
325.0000 mg | ORAL_TABLET | Freq: Three times a day (TID) | ORAL | Status: DC
  Administered 2015-07-17 – 2015-07-18 (×2): 325 mg via ORAL
  Filled 2015-07-17 (×2): qty 1

## 2015-07-17 MED ORDER — PROMETHAZINE HCL 25 MG/ML IJ SOLN
6.2500 mg | INTRAMUSCULAR | Status: DC | PRN
Start: 2015-07-17 — End: 2015-07-17

## 2015-07-17 MED ORDER — ONDANSETRON HCL 4 MG/2ML IJ SOLN: 4.0000 mg | Freq: Four times a day (QID) | INTRAMUSCULAR | Status: DC | PRN

## 2015-07-17 MED ORDER — PROPOFOL 500 MG/50ML IV EMUL
INTRAVENOUS | Status: DC | PRN
  Administered 2015-07-17: 100 ug/kg/min via INTRAVENOUS

## 2015-07-17 MED ORDER — ONDANSETRON HCL 4 MG/2ML IJ SOLN
INTRAMUSCULAR | Status: AC
  Filled 2015-07-17: qty 2

## 2015-07-17 MED ORDER — LACTATED RINGERS IV SOLN
INTRAVENOUS | Status: DC | PRN
  Administered 2015-07-17 (×2): via INTRAVENOUS

## 2015-07-17 MED ORDER — ONDANSETRON HCL 4 MG/2ML IJ SOLN
INTRAMUSCULAR | Status: DC | PRN
  Administered 2015-07-17: 4 mg via INTRAVENOUS

## 2015-07-17 MED ORDER — BISACODYL 10 MG RE SUPP: 10.0000 mg | Freq: Every day | RECTAL | Status: DC | PRN

## 2015-07-17 MED ORDER — MIDAZOLAM HCL 5 MG/5ML IJ SOLN
INTRAMUSCULAR | Status: DC | PRN
  Administered 2015-07-17: 2 mg via INTRAVENOUS

## 2015-07-17 MED ORDER — SODIUM CHLORIDE 0.9 % IV SOLN
1000.0000 mg | INTRAVENOUS | Status: AC
  Administered 2015-07-17: 1000 mg via INTRAVENOUS
  Filled 2015-07-17: qty 10

## 2015-07-17 MED ORDER — BUPIVACAINE-EPINEPHRINE (PF) 0.25% -1:200000 IJ SOLN
INTRAMUSCULAR | Status: DC | PRN
  Administered 2015-07-17: 30 mL

## 2015-07-17 MED ORDER — FENTANYL CITRATE (PF) 100 MCG/2ML IJ SOLN
INTRAMUSCULAR | Status: DC | PRN
  Administered 2015-07-17: 100 ug via INTRAVENOUS

## 2015-07-17 MED ORDER — LACTATED RINGERS IV SOLN
INTRAVENOUS | Status: DC
  Administered 2015-07-17: 1000 mL via INTRAVENOUS

## 2015-07-17 MED ORDER — CEFAZOLIN SODIUM-DEXTROSE 2-4 GM/100ML-% IV SOLN
INTRAVENOUS | Status: AC
  Filled 2015-07-17: qty 100

## 2015-07-17 MED ORDER — METHOCARBAMOL 500 MG PO TABS
500.0000 mg | ORAL_TABLET | Freq: Four times a day (QID) | ORAL | Status: DC | PRN
  Administered 2015-07-17 – 2015-07-18 (×2): 500 mg via ORAL
  Filled 2015-07-17 (×3): qty 1

## 2015-07-17 MED ORDER — METHOCARBAMOL 1000 MG/10ML IJ SOLN
500.0000 mg | Freq: Four times a day (QID) | INTRAVENOUS | Status: DC | PRN
  Filled 2015-07-17: qty 5

## 2015-07-17 MED ORDER — FENTANYL CITRATE (PF) 100 MCG/2ML IJ SOLN: 25.0000 ug | INTRAMUSCULAR | Status: DC | PRN

## 2015-07-17 MED ORDER — HYDROCODONE-ACETAMINOPHEN 7.5-325 MG PO TABS
1.0000 | ORAL_TABLET | ORAL | Status: DC
  Administered 2015-07-17 – 2015-07-18 (×6): 2 via ORAL
  Filled 2015-07-17 (×6): qty 2

## 2015-07-17 MED ORDER — DIPHENHYDRAMINE HCL 25 MG PO CAPS: 25.0000 mg | ORAL_CAPSULE | Freq: Four times a day (QID) | ORAL | Status: DC | PRN

## 2015-07-17 SURGICAL SUPPLY — 45 items
BAG DECANTER FOR FLEXI CONT (MISCELLANEOUS) IMPLANT
BAG SPEC THK2 15X12 ZIP CLS (MISCELLANEOUS)
BAG ZIPLOCK 12X15 (MISCELLANEOUS) IMPLANT
BANDAGE ACE 6X5 VEL STRL LF (GAUZE/BANDAGES/DRESSINGS) ×3 IMPLANT
BLADE SAW SGTL 13.0X1.19X90.0M (BLADE) ×3 IMPLANT
BOWL SMART MIX CTS (DISPOSABLE) ×3 IMPLANT
CAPT KNEE TOTAL 3 ATTUNE ×2 IMPLANT
CEMENT HV SMART SET (Cement) ×4 IMPLANT
CLOTH BEACON ORANGE TIMEOUT ST (SAFETY) ×3 IMPLANT
CUFF TOURN SGL QUICK 34 (TOURNIQUET CUFF) ×3
CUFF TRNQT CYL 34X4X40X1 (TOURNIQUET CUFF) ×1 IMPLANT
DECANTER SPIKE VIAL GLASS SM (MISCELLANEOUS) ×3 IMPLANT
DRAPE U-SHAPE 47X51 STRL (DRAPES) ×3 IMPLANT
DRESSING AQUACEL AG SP 3.5X10 (GAUZE/BANDAGES/DRESSINGS) ×1 IMPLANT
DRSG AQUACEL AG SP 3.5X10 (GAUZE/BANDAGES/DRESSINGS) ×3
DURAPREP 26ML APPLICATOR (WOUND CARE) ×6 IMPLANT
ELECT REM PT RETURN 9FT ADLT (ELECTROSURGICAL) ×3
ELECTRODE REM PT RTRN 9FT ADLT (ELECTROSURGICAL) ×1 IMPLANT
GLOVE BIOGEL M 7.0 STRL (GLOVE) IMPLANT
GLOVE BIOGEL PI IND STRL 7.5 (GLOVE) ×1 IMPLANT
GLOVE BIOGEL PI IND STRL 8.5 (GLOVE) ×1 IMPLANT
GLOVE BIOGEL PI INDICATOR 7.5 (GLOVE) ×2
GLOVE BIOGEL PI INDICATOR 8.5 (GLOVE) ×2
GLOVE ECLIPSE 8.0 STRL XLNG CF (GLOVE) ×3 IMPLANT
GLOVE ORTHO TXT STRL SZ7.5 (GLOVE) ×6 IMPLANT
GOWN STRL REUS W/TWL LRG LVL3 (GOWN DISPOSABLE) ×3 IMPLANT
GOWN STRL REUS W/TWL XL LVL3 (GOWN DISPOSABLE) ×3 IMPLANT
HANDPIECE INTERPULSE COAX TIP (DISPOSABLE) ×3
LIQUID BAND (GAUZE/BANDAGES/DRESSINGS) ×3 IMPLANT
MANIFOLD NEPTUNE II (INSTRUMENTS) ×3 IMPLANT
PACK TOTAL KNEE CUSTOM (KITS) ×3 IMPLANT
POSITIONER SURGICAL ARM (MISCELLANEOUS) ×3 IMPLANT
SET HNDPC FAN SPRY TIP SCT (DISPOSABLE) ×1 IMPLANT
SET PAD KNEE POSITIONER (MISCELLANEOUS) ×1 IMPLANT
SUT MNCRL AB 4-0 PS2 18 (SUTURE) ×3 IMPLANT
SUT VIC AB 1 CT1 36 (SUTURE) ×3 IMPLANT
SUT VIC AB 2-0 CT1 27 (SUTURE) ×9
SUT VIC AB 2-0 CT1 TAPERPNT 27 (SUTURE) ×3 IMPLANT
SUT VLOC 180 0 24IN GS25 (SUTURE) ×3 IMPLANT
SYR 50ML LL SCALE MARK (SYRINGE) ×1 IMPLANT
TRAY FOLEY W/METER SILVER 14FR (SET/KITS/TRAYS/PACK) ×1 IMPLANT
TRAY FOLEY W/METER SILVER 16FR (SET/KITS/TRAYS/PACK) ×3 IMPLANT
WATER STERILE IRR 1500ML POUR (IV SOLUTION) ×3 IMPLANT
WRAP KNEE MAXI GEL POST OP (GAUZE/BANDAGES/DRESSINGS) ×3 IMPLANT
YANKAUER SUCT BULB TIP 10FT TU (MISCELLANEOUS) ×3 IMPLANT

## 2015-07-17 NOTE — Interval H&P Note (Signed)
History and Physical Interval Note:  07/17/2015 7:27 AM  Chris Obrien  has presented today for surgery, with the diagnosis of LEFT KNEE OA  The various methods of treatment have been discussed with the patient and family. After consideration of risks, benefits and other options for treatment, the patient has consented to  Procedure(s): LEFT TOTAL KNEE ARTHROPLASTY (Left) as a surgical intervention .  The patient's history has been reviewed, patient examined, no change in status, stable for surgery.  I have reviewed the patient's chart and labs.  Questions were answered to the patient's satisfaction.     Mauri Pole

## 2015-07-17 NOTE — Op Note (Signed)
NAME:  Chris Obrien                      MEDICAL RECORD NO.:  [id number]                             FACILITY:  [facility]      PHYSICIAN:  Pietro Cassis. Alvan Dame, M.D.  DATE OF BIRTH:  12-26-1950      DATE OF PROCEDURE:  07/17/2015                                     OPERATIVE REPORT         PREOPERATIVE DIAGNOSIS:  Left knee osteoarthritis.      POSTOPERATIVE DIAGNOSIS:  Left knee osteoarthritis.      FINDINGS:  The patient was noted to have complete loss of cartilage and   bone-on-bone arthritis with associated osteophytes in all three compartments of   the knee with a significant synovitis and associated effusion.      PROCEDURE:  Left total knee replacement.      COMPONENTS USED:  DePuy Attune rotating platform posterior stabilized knee   system, a size 8 standard femur, 8 tibia, size 7 mm PS AOX insert, and 41 anatomic patellar   button.      SURGEON:  Pietro Cassis. Alvan Dame, M.D.      ASSISTANT:  Danae Orleans, PA-C.      ANESTHESIA:  Spinal.      SPECIMENS:  None.      COMPLICATION:  None.      DRAINS:  One Hemovac.  EBL: <100cc      TOURNIQUET TIME:   Total Tourniquet Time Documented: Thigh (Left) - 38 minutes Total: Thigh (Left) - 38 minutes      The patient was stable to the recovery room.      INDICATION FOR PROCEDURE:  Chris Obrien is a 65 y.o. male patient of   mine.  The patient had been seen, evaluated, and treated conservatively in the   office with medication, activity modification, and injections.  The patient had   radiographic changes of bone-on-bone arthritis with endplate sclerosis and osteophytes noted.      The patient failed conservative measures including medication, injections, and activity modification, and at this point was ready for more definitive measures.   Based on the radiographic changes and failed conservative measures, the patient   decided to proceed with total knee replacement.  Risks of infection,   DVT, component failure, need for  revision surgery, postop course, and   expectations were all   discussed and reviewed.  Consent was obtained for benefit of pain   relief.      PROCEDURE IN DETAIL:  The patient was brought to the operative theater.   Once adequate anesthesia, preoperative antibiotics, 2 gm of Ancef, 1 gm of Tranexamic Acid, and 10 mg of Decadron administered, the patient was positioned supine with the right thigh tourniquet placed.  The  right lower extremity was prepped and draped in sterile fashion.  A time-   out was performed identifying the patient, planned procedure, and   extremity.      The right lower extremity was placed in the [facility] leg holder.  The leg was   exsanguinated, tourniquet elevated to 250 mmHg.  A midline incision was   made followed by  median parapatellar arthrotomy.  Following initial   exposure, attention was first directed to the patella.  Precut   measurement was noted to be 28 mm.  I resected down to 16-17 mm and used a   41 anatomic patellar button to restore patellar height as well as cover the cut   surface.      The lug holes were drilled and a metal shim was placed to protect the   patella from retractors and saw blades.      At this point, attention was now directed to the femur.  The femoral   canal was opened with a drill, irrigated to try to prevent fat emboli.  An   intramedullary rod was passed at 5 degrees valgus, 9 mm of bone was   resected off the distal femur.  Following this resection, the tibia was   subluxated anteriorly.  Using the extramedullary guide, 2 mm of bone was resected off   the proximal medial tibia.  We confirmed the gap would be   stable medially and laterally with a size 6 insert as well as confirmed   the cut was perpendicular in the coronal plane, checking with an alignment rod.      Once this was done, I sized the femur to be a size 8 in the anterior-   posterior dimension, chose a standard component based on medial and   lateral  dimension.  The size 8 rotation block was then pinned in   position anterior referenced using the C-clamp to set rotation.  The   anterior, posterior, and  chamfer cuts were made without difficulty nor   notching making certain that I was along the anterior cortex to help   with flexion gap stability.      The final box cut was made off the lateral aspect of distal femur.      At this point, the tibia was sized to be a size 8, the size 8 tray was   then pinned in position through the medial third of the tubercle,   drilled, and keel punched.  Trial reduction was now carried with a 8 femur,  8 tibia, a size 6 then 7 mm insert, and the 41 anatomic patella botton.  The knee was brought to   extension, full extension with good flexion stability with the patella   tracking through the trochlea without application of pressure.  Given   all these findings, the trial components removed.  Final components were   opened and cement was mixed.  The knee was irrigated with normal saline   solution and pulse lavage.  The synovial lining was   then injected with 30 cc of 0.25% Marcaine with epinephrine and 1 cc of Toradol plus 30 cc of NS for a  total of 61 cc.      The knee was irrigated.  Final implants were then cemented onto clean and   dried cut surfaces of bone with the knee brought to extension with a size 7   mm trial insert.      Once the cement had fully cured, the excess cement was removed   throughout the knee.  I confirmed I was satisfied with the range of   motion and stability, and the final size 7 mm PS AOX insert was chosen.  It was   placed into the knee.      The tourniquet had been let down at 38 minutes.  No significant   hemostasis required.  The   extensor mechanism was then reapproximated using #1 Vicryl and #0 V-lock sutures with the knee   in flexion.  The   remaining wound was closed with 2-0 Vicryl and running 4-0 Monocryl.   The knee was cleaned, dried, dressed  sterilely using Dermabond and   Aquacel dressing.  The patient was then   brought to recovery room in stable condition, tolerating the procedure   well.   Please note that Physician Assistant, Danae Orleans, PA-C, was present for the entirety of the case, and was utilized for pre-operative positioning, peri-operative retractor management, general facilitation of the procedure.  He was also utilized for primary wound closure at the end of the case.              Pietro Cassis Alvan Dame, M.D.    07/17/2015 9:14 AM

## 2015-07-17 NOTE — Anesthesia Postprocedure Evaluation (Signed)
Anesthesia Post Note  Patient: Safeer Gillman Raffel  Procedure(s) Performed: Procedure(s) (LRB): LEFT TOTAL KNEE ARTHROPLASTY (Left)  Patient location during evaluation: PACU Anesthesia Type: Spinal Level of consciousness: oriented and awake and alert Pain management: pain level controlled Vital Signs Assessment: post-procedure vital signs reviewed and stable Respiratory status: spontaneous breathing, respiratory function stable and patient connected to nasal cannula oxygen Cardiovascular status: blood pressure returned to baseline and stable Postop Assessment: no headache, no backache, patient able to bend at knees, spinal receding and no signs of nausea or vomiting Anesthetic complications: no    Last Vitals:  Filed Vitals:   07/17/15 1030 07/17/15 1047  BP: 115/67 126/73  Pulse: 62 64  Temp:  36.7 C  Resp: 18 16    Last Pain:  Filed Vitals:   07/17/15 1056  PainSc: 0-No pain                 Catalina Gravel

## 2015-07-17 NOTE — Transfer of Care (Signed)
Immediate Anesthesia Transfer of Care Note  Patient: Chris Obrien  Procedure(s) Performed: Procedure(s): LEFT TOTAL KNEE ARTHROPLASTY (Left)  Patient Location: PACU  Anesthesia Type:Spinal  Level of Consciousness: awake, alert  and oriented  Airway & Oxygen Therapy: Patient Spontanous Breathing and Patient connected to face mask oxygen  Post-op Assessment: Report given to RN and Post -op Vital signs reviewed and stable  Post vital signs: Reviewed and stable  Last Vitals:  Filed Vitals:   07/17/15 0538  BP: 151/99  Pulse: 86  Temp: 37.4 C  Resp: 18    Last Pain: There were no vitals filed for this visit.       Complications: No apparent anesthesia complications

## 2015-07-17 NOTE — Anesthesia Procedure Notes (Signed)
Spinal Patient location during procedure: OR End time: 07/17/2015 7:40 AM Staffing Resident/CRNA: Noralyn Pick D Performed by: anesthesiologist and resident/CRNA  Preanesthetic Checklist Completed: patient identified, site marked, surgical consent, pre-op evaluation, timeout performed, IV checked, risks and benefits discussed and monitors and equipment checked Spinal Block Patient position: sitting Prep: Betadine Patient monitoring: heart rate, continuous pulse ox and blood pressure Location: L3-4 Injection technique: single-shot Needle Needle type: Sprotte  Needle gauge: 24 G Needle length: 9 cm Assessment Sensory level: T6 Additional Notes Expiration date of kit checked and confirmed. Patient tolerated procedure well, without complications.

## 2015-07-17 NOTE — Discharge Instructions (Signed)

## 2015-07-17 NOTE — Anesthesia Preprocedure Evaluation (Addendum)
Anesthesia Evaluation  Patient identified by MRN, date of birth, ID band Patient awake    Reviewed: Allergy & Precautions, NPO status , Patient's Chart, lab work & pertinent test results  Airway Mallampati: II  TM Distance: >3 FB Neck ROM: Full    Dental  (+) Teeth Intact, Dental Advisory Given   Pulmonary sleep apnea (no longer using CPAP after weight loss) , former smoker,    Pulmonary exam normal breath sounds clear to auscultation       Cardiovascular Exercise Tolerance: Good negative cardio ROS Normal cardiovascular exam Rhythm:Regular Rate:Normal     Neuro/Psych  Neuromuscular disease (Brachial plexopathy) negative psych ROS   GI/Hepatic Neg liver ROS, Benign neoplasm of colon    Endo/Other  negative endocrine ROS  Renal/GU negative Renal ROS     Musculoskeletal  (+) Arthritis , Osteoarthritis,  Limited ROM right shoulder   Abdominal   Peds  Hematology negative hematology ROS (+)   Anesthesia Other Findings Day of surgery medications reviewed with the patient.  Reproductive/Obstetrics                          Anesthesia Physical Anesthesia Plan  ASA: II  Anesthesia Plan: Spinal and MAC   Post-op Pain Management:    Induction: Intravenous  Airway Management Planned: Simple Face Mask  Additional Equipment:   Intra-op Plan:   Post-operative Plan:   Informed Consent: I have reviewed the patients History and Physical, chart, labs and discussed the procedure including the risks, benefits and alternatives for the proposed anesthesia with the patient or authorized representative who has indicated his/her understanding and acceptance.   Dental advisory given  Plan Discussed with: CRNA, Anesthesiologist and Surgeon  Anesthesia Plan Comments: (Discussed risks and benefits of and differences between spinal and general. Discussed risks of spinal including headache, backache,  failure, bleeding, infection, and nerve damage. Patient consents to spinal. Questions answered. Coagulation studies and platelet count acceptable.)        Anesthesia Quick Evaluation

## 2015-07-17 NOTE — Evaluation (Signed)
Physical Therapy Evaluation Patient Details Name: Chris Obrien MRN: [id number] DOB: [birth year] Today's Date: 07/17/2015   History of Present Illness  Pt is a 65 year old male s/p L TKA with hx of R TKA and multiple R knee surgeries  Clinical Impression  Pt is s/p L TKA resulting in the deficits listed below (see PT Problem List).  Pt will benefit from skilled PT to increase their independence and safety with mobility to allow discharge to the venue listed below.   Pt mobilizing well POD#0 and plans to d/c home with spouse.     Follow Up Recommendations Outpatient PT;Home health PT (per surgeon)    Equipment Recommendations  None recommended by PT    Recommendations for Other Services       Precautions / Restrictions Precautions Precautions: Fall;Knee Restrictions Other Position/Activity Restrictions: WBAT      Mobility  Bed Mobility Overal bed mobility: Needs Assistance Bed Mobility: Supine to Sit     Supine to sit: Supervision     General bed mobility comments: supervision for lines  Transfers Overall transfer level: Needs assistance Equipment used: Rolling walker (2 wheeled) Transfers: Sit to/from Stand Sit to Stand: Min guard         General transfer comment: min/guard for safety  Ambulation/Gait Ambulation/Gait assistance: Min guard Ambulation Distance (Feet): 100 Feet Assistive device: Rolling walker (2 wheeled) Gait Pattern/deviations: Step-through pattern;Decreased stride length;Decreased stance time - left     General Gait Details: pt using RW well, states pain controlled  Stairs            Wheelchair Mobility    Modified Rankin (Stroke Patients Only)       Balance                                             Pertinent Vitals/Pain Pain Assessment: 0-10 Pain Score: 2  Pain Location: L knee Pain Descriptors / Indicators: Sore Pain Intervention(s): Limited activity within patient's tolerance;Monitored during  session;Repositioned;Ice applied    Home Living Family/patient expects to be discharged to:: Private residence Living Arrangements: Spouse/significant other Available Help at Discharge: Family Type of Home: House Home Access: Stairs to enter   Technical brewer of Steps: 1 Home Layout: One level Home Equipment: Environmental consultant - 2 wheels;Bedside commode;Crutches      Prior Function Level of Independence: Independent               Hand Dominance        Extremity/Trunk Assessment               Lower Extremity Assessment: LLE deficits/detail   LLE Deficits / Details: able to perform SLR, observed 90* L knee flexion sitting EOB     Communication   Communication: No difficulties  Cognition Arousal/Alertness: Awake/alert Behavior During Therapy: WFL for tasks assessed/performed Overall Cognitive Status: Within Functional Limits for tasks assessed                      General Comments      Exercises        Assessment/Plan    PT Assessment Patient needs continued PT services  PT Diagnosis Difficulty walking;Acute pain   PT Problem List Decreased strength;Decreased range of motion;Decreased mobility;Pain  PT Treatment Interventions DME instruction;Gait training;Stair training;Functional mobility training;Therapeutic activities;Therapeutic exercise;Patient/family education   PT Goals (Current goals can be found  in the Care Plan section) Acute Rehab PT Goals PT Goal Formulation: With patient Time For Goal Achievement: 07/20/15 Potential to Achieve Goals: Good    Frequency 7X/week   Barriers to discharge        Co-evaluation               End of Session   Activity Tolerance: Patient tolerated treatment well Patient left: in chair;with call bell/phone within reach;with nursing/sitter in room Nurse Communication: Mobility status         Time: [id number] PT Time Calculation (min) (ACUTE ONLY): 12 min   Charges:   PT Evaluation $PT  Eval Low Complexity: 1 Procedure     PT G Codes:        Maecyn Panning,KATHrine E 07/17/2015, 3:05 PM Carmelia Bake, PT, DPT 07/17/2015 Pager: [phone]

## 2015-07-18 LAB — CBC
HCT: 37.1 % — ABNORMAL LOW (ref 39.0–52.0)
Hemoglobin: 12.4 g/dL — ABNORMAL LOW (ref 13.0–17.0)
MCH: 30.2 pg (ref 26.0–34.0)
MCHC: 33.4 g/dL (ref 30.0–36.0)
MCV: 90.3 fL (ref 78.0–100.0)
Platelets: 208 10*3/uL (ref 150–400)
RBC: 4.11 MIL/uL — ABNORMAL LOW (ref 4.22–5.81)
RDW: 13 % (ref 11.5–15.5)
WBC: 10.4 10*3/uL (ref 4.0–10.5)

## 2015-07-18 LAB — BASIC METABOLIC PANEL
Anion gap: 4 — ABNORMAL LOW (ref 5–15)
BUN: 25 mg/dL — ABNORMAL HIGH (ref 6–20)
CO2: 25 mmol/L (ref 22–32)
Calcium: 8 mg/dL — ABNORMAL LOW (ref 8.9–10.3)
Chloride: 110 mmol/L (ref 101–111)
Creatinine, Ser: 0.92 mg/dL (ref 0.61–1.24)
GFR calc Af Amer: >60 mL/min (ref >60)
GFR calc non Af Amer: >60 mL/min (ref >60)
Glucose, Bld: 137 mg/dL — ABNORMAL HIGH (ref 65–99)
Potassium: 4.3 mmol/L (ref 3.5–5.1)
Sodium: 139 mmol/L (ref 135–145)

## 2015-07-18 MED ORDER — TIZANIDINE HCL 4 MG PO TABS: 4.0000 mg | ORAL_TABLET | Freq: Four times a day (QID) | ORAL | Status: DC | PRN

## 2015-07-18 MED ORDER — CELECOXIB 200 MG PO CAPS: 200.0000 mg | ORAL_CAPSULE | Freq: Two times a day (BID) | ORAL | Status: DC

## 2015-07-18 MED ORDER — POLYETHYLENE GLYCOL 3350 17 G PO PACK: 17.0000 g | PACK | Freq: Two times a day (BID) | ORAL | Status: DC

## 2015-07-18 MED ORDER — ASPIRIN 325 MG PO TBEC: 325.0000 mg | DELAYED_RELEASE_TABLET | Freq: Two times a day (BID) | ORAL | Status: AC

## 2015-07-18 MED ORDER — FERROUS SULFATE 325 (65 FE) MG PO TABS: 325.0000 mg | ORAL_TABLET | Freq: Three times a day (TID) | ORAL | Status: DC

## 2015-07-18 MED ORDER — DOCUSATE SODIUM 100 MG PO CAPS: 100.0000 mg | ORAL_CAPSULE | Freq: Two times a day (BID) | ORAL | Status: DC

## 2015-07-18 MED ORDER — HYDROCODONE-ACETAMINOPHEN 7.5-325 MG PO TABS: 1.0000 | ORAL_TABLET | ORAL | Status: DC | PRN

## 2015-07-18 NOTE — Progress Notes (Signed)
Physical Therapy Treatment Patient Details Name: Chris Obrien MRN: [id number] DOB: 03-08-1950 Today's Date: Aug 07, 2015    History of Present Illness Pt is a 65 year old male s/p L TKA with hx of R TKA and multiple R knee surgeries    PT Comments    Pt mobilizing very well and performed LE exercises.  Pt feels ready for d/c home today.  Follow Up Recommendations  Outpatient PT     Equipment Recommendations  None recommended by PT    Recommendations for Other Services       Precautions / Restrictions Precautions Precautions: Fall;Knee Restrictions Other Position/Activity Restrictions: WBAT    Mobility  Bed Mobility Overal bed mobility: Modified Independent                Transfers Overall transfer level: Needs assistance Equipment used: Rolling walker (2 wheeled) Transfers: Sit to/from Stand Sit to Stand: Supervision            Ambulation/Gait Ambulation/Gait assistance: Supervision Ambulation Distance (Feet): 280 Feet Assistive device: Rolling walker (2 wheeled) Gait Pattern/deviations: Step-through pattern;Antalgic     General Gait Details: pt using RW well, states pain controlled   Stairs Stairs: Yes Stairs assistance: Min guard Stair Management: Step to pattern;Forwards;With walker Number of Stairs: 1 General stair comments: verbal cues for sequence, performed well  Wheelchair Mobility    Modified Rankin (Stroke Patients Only)       Balance                                    Cognition Arousal/Alertness: Awake/alert Behavior During Therapy: WFL for tasks assessed/performed Overall Cognitive Status: Within Functional Limits for tasks assessed                      Exercises Total Joint Exercises Ankle Circles/Pumps: AROM;Both;10 reps Quad Sets: AROM;Both;10 reps Short Arc Quad: AROM;Left;10 reps Heel Slides: 10 reps;Left;AAROM;Seated Hip ABduction/ADduction: AROM;Left;10 reps Straight Leg Raises:  AROM;Left;10 reps    General Comments        Pertinent Vitals/Pain Pain Assessment: 0-10 Pain Score: 2  Pain Location: L knee Pain Descriptors / Indicators: Sore Pain Intervention(s): Limited activity within patient's tolerance;Monitored during session;Repositioned;Other (comment) (pt brought polar care machine from home)    Home Living                      Prior Function            PT Goals (current goals can now be found in the care plan section) Progress towards PT goals: Progressing toward goals    Frequency  7X/week    PT Plan Current plan remains appropriate    Co-evaluation             End of Session   Activity Tolerance: Patient tolerated treatment well Patient left: with call bell/phone within reach;in bed     Time: 0951-1007 PT Time Calculation (min) (ACUTE ONLY): 16 min  Charges:  $Gait Training: 8-22 mins                    G Codes:      Heddy Vidana,KATHrine E August 07, 2015, 12:55 PM Carmelia Bake, PT, DPT 08/07/15 Pager: [phone]

## 2015-07-18 NOTE — Progress Notes (Signed)
Pt to d/c home with [facility]. No DME needs. AVS reviewed and "My Chart" discussed with pt. Pt capable of verbalizing medications, signs and symptoms of infection, and follow-up appointments. Remains hemodynamically stable. No signs and symptoms of distress. Educated pt to return to ER in the case of SOB, dizziness, or chest pain.

## 2015-07-18 NOTE — Care Management Note (Signed)
Case Management Note  Patient Details  Name: Chris Obrien MRN: [id number] Date of Birth: Jul 08, 1950  Subjective/Objective:                  LEFT TOTAL KNEE ARTHROPLASTY (Left) Action/Plan: Discharge planning Expected Discharge Date:  07/18/15               Expected Discharge Plan:  [name]  In-House Referral:     Discharge planning Services  CM Consult  Post Acute Care Choice:  Home Health Choice offered to:  Patient  DME Arranged:  N/A DME Agency:  NA  HH Arranged:  PT HH Agency:  [facility]  Status of Service:  Completed, signed off  If discussed at [employer] of Stay Meetings, dates discussed:    Additional Comments: CM met with pt in room to offer choice of home health agency.  Pt chooses [facility] to render HHPT.  Referral called to [facility] and Judson Roch of [facility] requested I fax face sheet, orders, face to face op note, and last progress note to [phone].  Cm faxed the requested information.  No other CM needs were communicated. Dellie Catholic, RN 07/18/2015, 10:59 AM

## 2015-07-18 NOTE — Progress Notes (Signed)
OT Cancellation Note  Patient Details Name: Chris Obrien MRN: [id number] DOB: February 17, 1950   Cancelled Treatment:    Reason Eval/Treat Not Completed: OT screened, no needs identified, will sign off  [place], Thereasa Parkin 07/18/2015, 10:18 AM

## 2015-07-18 NOTE — Progress Notes (Signed)
Patient ID: Chris Obrien, male   DOB: March 16, 1950, 65 y.o.   MRN: [id number] Subjective: 1 Day Post-Op Procedure(s) (LRB): LEFT TOTAL KNEE ARTHROPLASTY (Left)    Patient reports pain as moderate.  Doing well "a little sore" but ready to tackle rehab and get home  Objective:   VITALS:   Filed Vitals:   07/18/15 0226 07/18/15 0557  BP: 145/84 151/77  Pulse: 76 67  Temp: 97.2 F (36.2 C) 97.6 F (36.4 C)  Resp: 19 19    Neurovascular intact Incision: dressing C/D/I  Swelling minimal.  Has been using cooling system on knee  LABS  Recent Labs  07/18/15 0414  HGB 12.4*  HCT 37.1*  WBC 10.4  PLT 208     Recent Labs  07/18/15 0414  NA 139  K 4.3  BUN 25*  CREATININE 0.92  GLUCOSE 137*    No results for input(s): LABPT, INR in the last 72 hours.   Assessment/Plan: 1 Day Post-Op Procedure(s) (LRB): LEFT TOTAL KNEE ARTHROPLASTY (Left)   Advance diet Up with therapy Discharge home with home health after probably am therapy  RTC in 2 weeks Reviewed goals

## 2015-07-19 ENCOUNTER — Telehealth: Payer: Self-pay | Admitting: *Deleted

## 2015-07-19 NOTE — Telephone Encounter (Signed)
Pt was on TCM list admitted for surgery had a total (L) knee arthroplasty. D/c 7/10 and will f/u w/Dr. Alvan Dame in 2 weeks...Johny Chess

## 2015-07-24 NOTE — Discharge Summary (Signed)
Physician Discharge Summary  Patient ID: Chris Obrien MRN: [id number] DOB/AGE: [birth year] 65 y.o.  Admit date: 07/17/2015 Discharge date: 07/18/2015   Procedures:  Procedure(s) (LRB): LEFT TOTAL KNEE ARTHROPLASTY (Left)  Attending Physician:  Dr. Paralee Cancel   Admission Diagnoses:   Left knee primary OA / pain  Discharge Diagnoses:  Principal Problem:   S/p left TKA Active Problems:   S/P right TKA  Past Medical History  Diagnosis Date  . Benign neoplasm of colon   . Insomnia, unspecified   . Oligospermia   . Swelling of joint of right knee   . Right knee skin infection   . BPH (benign prostatic hypertrophy)     hx small stream  . Arthritis   . Acute reactive arthritis ([place]) per pcp note from 03-30-2012    right pre-patella---  secondary contact dermitis with poison ivy exposure 03-26-2012  . OSA on CPAP     PT LOST  WEIGHT - NO LONGER USING CPAP  . 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 male, has a history of pain and functional disability in the left knee due to arthritis and has failed non-surgical conservative treatments for greater than 12 weeks to include NSAID's and/or analgesics, corticosteriod injections and activity modification. Onset of symptoms was gradual, starting >10 years ago with gradually worsening course since that time. The patient noted prior procedures on the knee to include arthroplasty on the right knee(s). Patient currently rates pain in the left knee(s) at 10 out of 10 with activity. Patient has night pain, worsening of pain with activity and weight bearing, pain that interferes with activities of daily living, pain with passive range of motion, crepitus and joint swelling. Patient has evidence of periarticular osteophytes and joint space narrowing by imaging studies. There is no active infection. Risks, benefits  and expectations were discussed with the patient. Risks including but not limited to the risk of anesthesia, blood clots, nerve damage, blood vessel damage, failure of the prosthesis, infection and up to and including death. Patient understand the risks, benefits and expectations and wishes to proceed with surgery.   PCP: Walker Kehr, MD   Discharged Condition: good  Hospital Course:  Patient underwent the above stated procedure on 07/17/2015. Patient tolerated the procedure well and brought to the recovery room in good condition and subsequently to the floor.  POD #1 BP: 151/77 ; Pulse: 67 ; Temp: 97.6 F (36.4 C) ; Resp: 19 Patient reports pain as moderate. Doing well "a little sore" but ready to tackle rehab and get home. Neurovascular intact and incision: dressing C/D/I.  Swelling minimal. Has been using cooling system on knee.  LABS  Basename    HGB     12.4  HCT     37.1    Discharge Exam: General appearance: alert, cooperative and no distress Extremities: Homans sign is negative, no sign of DVT, no edema, redness or tenderness in the calves or thighs and no ulcers, gangrene or trophic changes  Disposition: Home with follow up in 2 weeks   Follow-up Information    Follow up with Mauri Pole, MD. Schedule an appointment as soon as possible for a visit in 2 weeks.   Specialty:  Orthopedic Surgery   Contact information:   [address] [place] [address] [phone]  Follow up with [place].   Specialty:  Home Health Services   Why:  home health physical therapy   Contact information:   [address] [place] [phone]       Discharge Instructions    Call MD / Call 911    Complete by:  As directed   If you experience chest pain or shortness of breath, CALL 911 and be transported to the hospital emergency room.  If you develope a fever above 101 F, pus (white drainage) or increased drainage or  redness at the wound, or calf pain, call your surgeon's office.     Change dressing    Complete by:  As directed   Maintain surgical dressing until follow up in the clinic. If the edges start to pull up, may reinforce with tape. If the dressing is no longer working, may remove and cover with gauze and tape, but must keep the area dry and clean.  Call with any questions or concerns.     Constipation Prevention    Complete by:  As directed   Drink plenty of fluids.  Prune juice may be helpful.  You may use a stool softener, such as Colace (over the counter) 100 mg twice a day.  Use MiraLax (over the counter) for constipation as needed.     Diet - low sodium heart healthy    Complete by:  As directed      Discharge instructions    Complete by:  As directed   Maintain surgical dressing until follow up in the clinic. If the edges start to pull up, may reinforce with tape. If the dressing is no longer working, may remove and cover with gauze and tape, but must keep the area dry and clean.  Follow up in 2 weeks at [facility]. Call with any questions or concerns.     Increase activity slowly as tolerated    Complete by:  As directed   Weight bearing as tolerated with assist device (walker, cane, etc) as directed, use it as long as suggested by your surgeon or therapist, typically at least 4-6 weeks.     TED hose    Complete by:  As directed   Use stockings (TED hose) for 2 weeks on both leg(s).  You may remove them at night for sleeping.             Medication List    STOP taking these medications        naproxen sodium 220 MG tablet  Commonly known as:  ANAPROX      TAKE these medications        aspirin 325 MG EC tablet  Take 1 tablet (325 mg total) by mouth 2 (two) times daily.     celecoxib 200 MG capsule  Commonly known as:  CELEBREX  Take 1 capsule (200 mg total) by mouth every 12 (twelve) hours.     docusate sodium 100 MG capsule  Commonly known as:  COLACE  Take  1 capsule (100 mg total) by mouth 2 (two) times daily.     ferrous sulfate 325 (65 FE) MG tablet  Take 1 tablet (325 mg total) by mouth 3 (three) times daily after meals.     folic acid 1 MG tablet  Commonly known as:  FOLVITE  Take 1 mg by mouth every morning.     GLUCOSAMINE CHONDR 1500 COMPLX Caps  Take 1 capsule by mouth 2 (two) times daily.  HYDROcodone-acetaminophen 7.5-325 MG tablet  Commonly known as:  NORCO  Take 1-2 tablets by mouth every 4 (four) hours as needed for moderate pain.     multivitamin with minerals Tabs tablet  Take 1 tablet by mouth every morning.     polyethylene glycol packet  Commonly known as:  MIRALAX / GLYCOLAX  Take 17 g by mouth 2 (two) times daily.     tamsulosin 0.4 MG Caps capsule  Commonly known as:  FLOMAX  Take 0.4 mg by mouth.     tiZANidine 4 MG tablet  Commonly known as:  ZANAFLEX  Take 1 tablet (4 mg total) by mouth every 6 (six) hours as needed for muscle spasms.     zolpidem 10 MG tablet  Commonly known as:  AMBIEN  Take 0.5-1 tablets (5-10 mg total) by mouth at bedtime as needed. Can use half tab if sufficient.         Signed: [place]. Haziel Molner   PA-C  07/24/2015, 9:05 PM

## 2015-11-21 ENCOUNTER — Ambulatory Visit (INDEPENDENT_AMBULATORY_CARE_PROVIDER_SITE_OTHER): Payer: PRIVATE HEALTH INSURANCE | Admitting: Physician Assistant

## 2015-11-21 VITALS — BP 126/76 | HR 101 | Temp 99.1°F | Resp 18 | Ht 72.0 in | Wt 257.4 lb

## 2015-11-21 DIAGNOSIS — R21 Rash and other nonspecific skin eruption: Secondary | ICD-10-CM | POA: Diagnosis not present

## 2015-11-21 MED ORDER — PREDNISONE 10 MG PO TABS: ORAL_TABLET | ORAL | 0 refills | Status: DC

## 2015-11-21 MED ORDER — RANITIDINE HCL 150 MG PO TABS: 150.0000 mg | ORAL_TABLET | Freq: Two times a day (BID) | ORAL | 0 refills | Status: DC

## 2015-11-21 MED ORDER — CETIRIZINE HCL 10 MG PO TABS: 10.0000 mg | ORAL_TABLET | Freq: Every day | ORAL | 0 refills | Status: DC

## 2015-11-21 MED ORDER — METHYLPREDNISOLONE ACETATE 80 MG/ML IJ SUSP
80.0000 mg | Freq: Once | INTRAMUSCULAR | Status: AC
  Administered 2015-11-21: 80 mg via INTRAMUSCULAR

## 2015-11-21 NOTE — Progress Notes (Signed)
Chris Obrien  MRN: [id number] DOB: [birth year]  Subjective:  Chris Obrien is a 65 y.o. male seen in office today for a chief complaint of allergic reaction to amoxicillin. Pt had a tooth extratcion five days ago. He started taking prophylactic amoxicllin that day and three days ago he noticed his legs were itching while he was out fishing.  He then stopped taking amoxicillin Sunday evening and called his dentist. His dentist told him to take benedryl every 8 hours so yestedary he tried benedryl 25mg  every 8 hours and then woke up this morning with swollen eyes, took another benedryl this morning at 8am and it helped his swollen eyes. He now has a diffuse body rash, achy ankles, elbow, and knee joints. Denies difficulty swallowing, fever, chills, nausea, vomiting. Of note, he had a vancomycin reaction in 2014 and ended up getting red man syndrome.   Review of Systems  Respiratory: Negative for chest tightness, shortness of breath and wheezing.   Gastrointestinal: Negative for diarrhea, nausea and vomiting.    Patient Active Problem List   Diagnosis Date Noted  . S/p left TKA 07/17/2015  . Acute bronchitis 10/07/2014  . Wheezing 10/07/2014  . Obstructive sleep apnea syndrome 04/28/2014  . 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 OSA (obstructive sleep apnea) 02/14/2014  . Benign paroxysmal positional vertigo 01/27/2014  . Cold sore 01/27/2014  . Elevated BP 01/27/2014  . 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 Pain in joint, shoulder region 12/07/2012  . S/P right TK revision 06/15/2012  . Cervical radiculopathy 05/07/2012  . Obesity (BMI 30-39.9) 05/03/2012  . Normocytic anemia 04/28/2012  . S/P Right knee removal of prosthesis with antibiotic spacer 04/27/2012  . Rash and nonspecific skin eruption 04/25/2012  . Right reactive prepatellar bursitis 04/02/2012  . Expected blood loss anemia 01/29/2012  . Obesity (BMI 30.0-34.9) 01/29/2012  . S/P right TKA 01/28/2012  . Well adult  exam 09/24/2011  . Knee pain 09/24/2011  . Obstructive sleep apnea 02/01/2008  . SNORING 11/19/2007  . DYSPNEA 07/29/2007  . CHEST PAIN, UNSPECIFIED 07/29/2007  . POLYP, COLON 05/30/2007  . IRREGULAR HEART RATE 05/30/2007  . INSOMNIA 05/30/2007  . BENIGN PROSTATIC HYPERTROPHY, HX OF 05/30/2007  . ARTHROSCOPY, KNEE, HX OF 05/30/2007    Current Outpatient Prescriptions on File Prior to Visit  Medication Sig Dispense Refill  . celecoxib (CELEBREX) 200 MG capsule Take 1 capsule (200 mg total) by mouth every 12 (twelve) hours. 60 capsule 0  . folic acid (FOLVITE) 1 MG tablet Take 1 mg by mouth every morning.     . Glucosamine-Chondroit-Vit C-Mn (GLUCOSAMINE CHONDR 1500 COMPLX) CAPS Take 1 capsule by mouth 2 (two) times daily.     . Multiple Vitamin (MULTIVITAMIN WITH MINERALS) TABS Take 1 tablet by mouth every morning.     . tamsulosin (FLOMAX) 0.4 MG CAPS capsule Take 0.4 mg by mouth.    . zolpidem (AMBIEN) 10 MG tablet Take 0.5-1 tablets (5-10 mg total) by mouth at bedtime as needed. Can use half tab if sufficient. 30 tablet 1  . ferrous sulfate 325 (65 FE) MG tablet Take 1 tablet (325 mg total) by mouth 3 (three) times daily after meals. (Patient not taking: Reported on 11/21/2015)  3   No current facility-administered medications on file prior to visit.     Allergies  Allergen Reactions  . Amoxicillin Hives  . Vancomycin Rash    Severe redness all over body with swollen ankles and hands  . Oxycodone Rash  Social History   Social History  . Marital status: Married    Spouse name: N/A  . Number of children: N/A  . Years of education: 66   Occupational History  . manager     [employer]  . [employer]   Social History Main Topics  . Smoking status: Former Smoker    Types: Cigars    Quit date: 01/08/2008  . Smokeless tobacco: Never Used  . Alcohol use Yes     Comment: occasionally  . Drug use: No  . Sexual activity: Not on file   Other Topics  Concern  . Not on file   Social History Narrative   Caffeine 4 cups avg daily, 4 cans soda.      Objective:  BP 126/76 (BP Location: Right Arm, Patient Position: Sitting, Cuff Size: Large)   Pulse (!) 101   Temp 99.1 F (37.3 C) (Oral)   Resp 18   Ht 6' (1.829 m)   Wt 257 lb 6.4 oz (116.8 kg)   SpO2 94%   BMI 34.91 kg/m   Physical Exam  Constitutional: He is oriented to person, place, and time and well-developed, well-nourished, and in no distress.  HENT:  Head: Normocephalic and atraumatic.  Mouth/Throat: Uvula is midline, oropharynx is clear and moist and mucous membranes are normal.  Eyes: Conjunctivae are normal.  Neck: Normal range of motion.  Cardiovascular: Regular rhythm and normal heart sounds.  Tachycardia present.   Pulmonary/Chest: Effort normal and breath sounds normal.  Neurological: He is alert and oriented to person, place, and time. Gait normal.  Skin: Skin is warm and dry. Rash noted. Rash is maculopapular (diffuse, bilateral arms, trunk, bilateral legs) and urticarial (few urticarial lesions dispersed within maculopapular rash).  Psychiatric: Affect normal.  Vitals reviewed.   Assessment and Plan :   1. Rash and nonspecific skin eruption -Pt given depo-medrol shot in office today, instructed to start prednisone taper tomorrow. Instrucetd he can use zantac and zyrtec today when he gets home.  - methylPREDNISolone acetate (DEPO-MEDROL) injection 80 mg; Inject 1 mL (80 mg total) into the muscle once. - predniSONE (DELTASONE) 10 MG tablet; 6-5-4-3-2-1 taper. Take all pills for that day in the am with food.  Dispense: 21 tablet; Refill: 0 - ranitidine (ZANTAC) 150 MG tablet; Take 1 tablet (150 mg total) by mouth 2 (two) times daily.  Dispense: 30 tablet; Refill: 0 - cetirizine (ZYRTEC) 10 MG tablet; Take 1 tablet (10 mg total) by mouth daily.  Dispense: 30 tablet; Refill: 0 -Instructed that if symptoms worsen or he develops any new worsening symptoms such as  difficulty breathing, shortness of breath, worsening rash, call 911 immediately.  -Return to clinic if no improvement in 2 days for further evaluation.    Tenna Delaine PA-C  [facility] 11/21/2015 3:42 PM

## 2015-11-21 NOTE — Patient Instructions (Addendum)
   Today you can take one zantac and one zyrtec when you get home. Then you can take a benedryl before bed tonight. Start prednisone taper tomorrow.   If your symptoms worsen or you develop any new worsening symptoms such as difficulty breathing, shortness of breath, worsening rash, call 911.   If you do not see any improvement with treatment, follow up here with me in two days (Thrusday 11/16)  Thank you for letting me participate in your health and well being.   IF you received an x-ray today, you will receive an invoice from [facility]. Please contact [facility] at [phone] with questions or concerns regarding your invoice.   IF you received labwork today, you will receive an invoice from [employer]. Please contact [facility] at [phone] with questions or concerns regarding your invoice.   Our billing staff will not be able to assist you with questions regarding bills from these companies.  You will be contacted with the lab results as soon as they are available. The fastest way to get your results is to activate your My Chart account. Instructions are located on the last page of this paperwork. If you have not heard from [place] regarding the results in 2 weeks, please contact this office.

## 2016-06-11 ENCOUNTER — Encounter: Payer: Self-pay | Admitting: Family

## 2016-06-11 ENCOUNTER — Ambulatory Visit (INDEPENDENT_AMBULATORY_CARE_PROVIDER_SITE_OTHER): Payer: 59 | Admitting: Family

## 2016-06-11 VITALS — BP 142/86 | HR 75 | Temp 98.6°F | Resp 18 | Ht 72.0 in | Wt 246.8 lb

## 2016-06-11 DIAGNOSIS — R21 Rash and other nonspecific skin eruption: Secondary | ICD-10-CM

## 2016-06-11 MED ORDER — HYDROCORTISONE 2.5 % EX CREA: TOPICAL_CREAM | Freq: Two times a day (BID) | CUTANEOUS | 0 refills | Status: DC

## 2016-06-11 NOTE — Assessment & Plan Note (Signed)
Scalp rash appear allergic in nature / dermatitis with slow improvement. Start hydrocortisone cream. Continue OTC medications and consider Selsun Blue shampoo. Follow up if symptoms worsen or do not improve.

## 2016-06-11 NOTE — Progress Notes (Signed)
Subjective:    Patient ID: Chris Obrien, male    DOB: 1951/01/04, 66 y.o.   MRN: [id number]  Chief Complaint  Patient presents with  . Insect bites    has what he thinks is insect bites on the back of head, some swelling     HPI:  Chris Obrien is a 66 y.o. 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 and presents today for an acute office visit.   This is a new problem. Associated symptom of discomfort located on the right posterior aspect of his head has been going on for about 1 week. No fevers. Modifying factors include Calamine lotion and Benedryl which seems to have helped a little. Severity has woken him up from sleep. Unsure if bitten by anything.   Allergies  Allergen Reactions  . Amoxicillin Hives  . Vancomycin Rash    Severe redness all over body with swollen ankles and hands  . Oxycodone Rash      Outpatient Medications Prior to Visit  Medication Sig Dispense Refill  . celecoxib (CELEBREX) 200 MG capsule Take 1 capsule (200 mg total) by mouth every 12 (twelve) hours. 60 capsule 0  . cetirizine (ZYRTEC) 10 MG tablet Take 1 tablet (10 mg total) by mouth daily. 30 tablet 0  . ferrous sulfate 325 (65 FE) MG tablet Take 1 tablet (325 mg total) by mouth 3 (three) times daily after meals.  3  . folic acid (FOLVITE) 1 MG tablet Take 1 mg by mouth every morning.     . Glucosamine-Chondroit-Vit C-Mn (GLUCOSAMINE CHONDR 1500 COMPLX) CAPS Take 1 capsule by mouth 2 (two) times daily.     . Multiple Vitamin (MULTIVITAMIN WITH MINERALS) TABS Take 1 tablet by mouth every morning.     . naproxen sodium (ANAPROX) 220 MG tablet Take 220 mg by mouth 2 (two) times daily with a meal.    . predniSONE  (DELTASONE) 10 MG tablet 6-5-4-3-2-1 taper. Take all pills for that day in the am with food. 21 tablet 0  . ranitidine (ZANTAC) 150 MG tablet Take 1 tablet (150 mg total) by mouth 2 (two) times daily. 30 tablet 0  . tamsulosin (FLOMAX) 0.4 MG CAPS capsule Take 0.4 mg by mouth.    . zolpidem (AMBIEN) 10 MG tablet Take 0.5-1 tablets (5-10 mg total) by mouth at bedtime as needed. Can use half tab if sufficient. 30 tablet 1   No facility-administered medications prior to visit.     Review of Systems  Constitutional: Negative for chills and fever.  Skin: Positive for rash.      Objective:    BP (!) 142/86 (BP Location: Left Arm, Patient Position: Sitting, Cuff Size: Large)   Pulse 75   Temp 98.6 F (37 C) (Oral)   Resp 18   Ht 6' (1.829 m)   Wt 246 lb 12.8 oz (111.9 kg)   SpO2 97%   BMI 33.47 kg/m  Nursing note and vital signs reviewed.  Physical Exam  Constitutional: He is oriented to person, place, and time. He appears well-developed and well-nourished. No distress.  Cardiovascular: Normal rate, regular rhythm, normal heart sounds and intact distal pulses.   Pulmonary/Chest: Effort normal and breath sounds normal.  Neurological: He is alert and oriented  to person, place, and time.  Skin: Skin is warm and dry. Rash (Scalp rash sporadically located around his scalp in various sizes and appears red/pink with no scaling. ) noted.  Psychiatric: He has a normal mood and affect. His behavior is normal. Judgment and thought content normal.       Assessment & Plan:   Problem List Items Addressed This Visit      Musculoskeletal and Integument   Rash and nonspecific skin eruption - Primary    Scalp rash appear allergic in nature / dermatitis with slow improvement. Start hydrocortisone cream. Continue OTC medications and consider Selsun Blue shampoo. Follow up if symptoms worsen or do not improve.           I am having Mr. Cicio start on hydrocortisone. I am also having him  maintain his folic acid, multivitamin with minerals, GLUCOSAMINE CHONDR 1500 COMPLX, tamsulosin, zolpidem, celecoxib, ferrous sulfate, naproxen sodium, predniSONE, ranitidine, and cetirizine.   Meds ordered this encounter  Medications  . hydrocortisone 2.5 % cream    Sig: Apply topically 2 (two) times daily.    Dispense:  30 g    Refill:  0    Order Specific Question:   Supervising Provider    Answer:   Pricilla Holm A [[id number]]     Follow-up: Return if symptoms worsen or fail to improve.  Mauricio Po, FNP

## 2016-06-11 NOTE — Patient Instructions (Signed)
Thank you for choosing [facility].  SUMMARY AND INSTRUCTIONS:  Apply the Hydrocortisone cream as needed.   May trial Selsun blue.    Continue the Benedryl and Calamine as needed.   Medication:  Your prescription(s) have been submitted to your pharmacy or been printed and provided for you. Please take as directed and contact our office if you believe you are having problem(s) with the medication(s) or have any questions.  Follow up:  If your symptoms worsen or fail to improve, please contact our office for further instruction, or in case of emergency go directly to the emergency room at the closest medical facility.

## 2016-06-14 ENCOUNTER — Ambulatory Visit (INDEPENDENT_AMBULATORY_CARE_PROVIDER_SITE_OTHER): Payer: 59 | Admitting: Internal Medicine

## 2016-06-14 ENCOUNTER — Other Ambulatory Visit (INDEPENDENT_AMBULATORY_CARE_PROVIDER_SITE_OTHER): Payer: 59

## 2016-06-14 ENCOUNTER — Encounter: Payer: Self-pay | Admitting: Internal Medicine

## 2016-06-14 VITALS — BP 138/88 | HR 68 | Temp 98.8°F | Ht 72.0 in | Wt 247.0 lb

## 2016-06-14 DIAGNOSIS — Z0000 Encounter for general adult medical examination without abnormal findings: Secondary | ICD-10-CM | POA: Diagnosis not present

## 2016-06-14 DIAGNOSIS — G4730 Sleep apnea, unspecified: Secondary | ICD-10-CM

## 2016-06-14 DIAGNOSIS — G4731 Primary central sleep apnea: Secondary | ICD-10-CM | POA: Diagnosis not present

## 2016-06-14 DIAGNOSIS — G4700 Insomnia, unspecified: Secondary | ICD-10-CM | POA: Diagnosis not present

## 2016-06-14 DIAGNOSIS — T7840XS Allergy, unspecified, sequela: Secondary | ICD-10-CM

## 2016-06-14 LAB — CBC WITH DIFFERENTIAL/PLATELET
Basophils Absolute: 0 10*3/uL (ref 0.0–0.1)
Basophils Relative: 0.7 % (ref 0.0–3.0)
Eosinophils Absolute: 0.3 10*3/uL (ref 0.0–0.7)
Eosinophils Relative: 6.7 % — ABNORMAL HIGH (ref 0.0–5.0)
HCT: 47.9 % (ref 39.0–52.0)
Hemoglobin: 16.1 g/dL (ref 13.0–17.0)
Lymphocytes Relative: 27.7 % (ref 12.0–46.0)
Lymphs Abs: 1.3 10*3/uL (ref 0.7–4.0)
MCHC: 33.7 g/dL (ref 30.0–36.0)
MCV: 90.5 fl (ref 78.0–100.0)
Monocytes Absolute: 0.5 10*3/uL (ref 0.1–1.0)
Monocytes Relative: 9.7 % (ref 3.0–12.0)
Neutro Abs: 2.6 10*3/uL (ref 1.4–7.7)
Neutrophils Relative %: 55.2 % (ref 43.0–77.0)
Platelets: 268 10*3/uL (ref 150.0–400.0)
RBC: 5.29 Mil/uL (ref 4.22–5.81)
RDW: 13.3 % (ref 11.5–15.5)
WBC: 4.7 10*3/uL (ref 4.0–10.5)

## 2016-06-14 LAB — BASIC METABOLIC PANEL
BUN: 21 mg/dL (ref 6–23)
CO2: 30 mEq/L (ref 19–32)
Calcium: 9.3 mg/dL (ref 8.4–10.5)
Chloride: 104 mEq/L (ref 96–112)
Creatinine, Ser: 1.06 mg/dL (ref 0.40–1.50)
GFR: 73.84 mL/min (ref >60.00)
Glucose, Bld: 96 mg/dL (ref 70–99)
Potassium: 4.7 mEq/L (ref 3.5–5.1)
Sodium: 139 mEq/L (ref 135–145)

## 2016-06-14 LAB — LIPID PANEL
Cholesterol: 189 mg/dL (ref 0–200)
HDL: 52.9 mg/dL (ref >39.00)
LDL Cholesterol: 113 mg/dL — ABNORMAL HIGH (ref 0–99)
NonHDL: 136.26
Total CHOL/HDL Ratio: 4
Triglycerides: 117 mg/dL (ref 0.0–149.0)
VLDL: 23.4 mg/dL (ref 0.0–40.0)

## 2016-06-14 LAB — URINALYSIS
Bilirubin Urine: NEGATIVE
Hgb urine dipstick: NEGATIVE
Ketones, ur: NEGATIVE
Leukocytes, UA: NEGATIVE
Nitrite: NEGATIVE
Specific Gravity, Urine: <=1.005 — AB (ref 1.000–1.030)
Total Protein, Urine: NEGATIVE
Urine Glucose: NEGATIVE
Urobilinogen, UA: 0.2 (ref 0.0–1.0)
pH: 6.5 (ref 5.0–8.0)

## 2016-06-14 LAB — HEPATIC FUNCTION PANEL
ALT: 16 U/L (ref 0–53)
AST: 14 U/L (ref 0–37)
Albumin: 4.3 g/dL (ref 3.5–5.2)
Alkaline Phosphatase: 62 U/L (ref 39–117)
Bilirubin, Direct: 0.1 mg/dL (ref 0.0–0.3)
Total Bilirubin: 0.8 mg/dL (ref 0.2–1.2)
Total Protein: 6.3 g/dL (ref 6.0–8.3)

## 2016-06-14 LAB — TSH: TSH: 1.58 u[IU]/mL (ref 0.35–4.50)

## 2016-06-14 LAB — PSA: PSA: 2.99 ng/mL (ref 0.10–4.00)

## 2016-06-14 MED ORDER — VALACYCLOVIR HCL 1 G PO TABS: 1000.0000 mg | ORAL_TABLET | Freq: Three times a day (TID) | ORAL | 1 refills | Status: DC

## 2016-06-14 MED ORDER — ZOLPIDEM TARTRATE 10 MG PO TABS: 5.0000 mg | ORAL_TABLET | Freq: Every evening | ORAL | 1 refills | Status: DC | PRN

## 2016-06-14 MED ORDER — CLOBETASOL PROPIONATE 0.05 % EX SOLN: 1.0000 "application " | Freq: Two times a day (BID) | CUTANEOUS | 0 refills | Status: DC

## 2016-06-14 NOTE — Patient Instructions (Signed)
S[place]rix

## 2016-06-14 NOTE — Progress Notes (Signed)
Subjective:  Patient ID: Chris Obrien, male    DOB: 06-26-1950  Age: 66 y.o. MRN: [id number]  CC: No chief complaint on file.   HPI Yeriel Mineo Dauenhauer presents for a well exam C/o rash on scalp, pain L scalp C/o allergies  Outpatient Medications Prior to Visit  Medication Sig Dispense Refill  . celecoxib (CELEBREX) 200 MG capsule Take 1 capsule (200 mg total) by mouth every 12 (twelve) hours. 60 capsule 0  . ferrous sulfate 325 (65 FE) MG tablet Take 1 tablet (325 mg total) by mouth 3 (three) times daily after meals.  3  . folic acid (FOLVITE) 1 MG tablet Take 1 mg by mouth every morning.     . Glucosamine-Chondroit-Vit C-Mn (GLUCOSAMINE CHONDR 1500 COMPLX) CAPS Take 1 capsule by mouth 2 (two) times daily.     . hydrocortisone 2.5 % cream Apply topically 2 (two) times daily. 30 g 0  . Multiple Vitamin (MULTIVITAMIN WITH MINERALS) TABS Take 1 tablet by mouth every morning.     . naproxen sodium (ANAPROX) 220 MG tablet Take 220 mg by mouth 2 (two) times daily with a meal.    . tamsulosin (FLOMAX) 0.4 MG CAPS capsule Take 0.4 mg by mouth.    . zolpidem (AMBIEN) 10 MG tablet Take 0.5-1 tablets (5-10 mg total) by mouth at bedtime as needed. Can use half tab if sufficient. 30 tablet 1  . cetirizine (ZYRTEC) 10 MG tablet Take 1 tablet (10 mg total) by mouth daily. 30 tablet 0  . predniSONE (DELTASONE) 10 MG tablet 6-5-4-3-2-1 taper. Take all pills for that day in the am with food. 21 tablet 0  . ranitidine (ZANTAC) 150 MG tablet Take 1 tablet (150 mg total) by mouth 2 (two) times daily. 30 tablet 0   No facility-administered medications prior to visit.     ROS Review of Systems  Constitutional: Negative for appetite change, fatigue and unexpected weight change.  HENT: Negative for congestion, nosebleeds, sneezing, sore throat and trouble swallowing.   Eyes: Negative for itching and visual disturbance.  Respiratory: Negative for cough.   Cardiovascular: Negative for chest pain,  palpitations and leg swelling.  Gastrointestinal: Negative for abdominal distention, blood in stool, diarrhea and nausea.  Genitourinary: Negative for frequency and hematuria.  Musculoskeletal: Negative for back pain, gait problem, joint swelling and neck pain.  Skin: Negative for rash.  Neurological: Negative for dizziness, tremors, speech difficulty and weakness.  Psychiatric/Behavioral: Negative for agitation, dysphoric mood, sleep disturbance and suicidal ideas. The patient is not nervous/anxious.     Objective:  BP 138/88 (BP Location: Left Arm, Patient Position: Sitting, Cuff Size: Large)   Pulse 68   Temp 98.8 F (37.1 C) (Oral)   Ht 6' (1.829 m)   Wt 247 lb (112 kg)   SpO2 98%   BMI 33.50 kg/m   BP Readings from Last 3 Encounters:  06/14/16 138/88  06/11/16 (!) 142/86  11/21/15 126/76    Wt Readings from Last 3 Encounters:  06/14/16 247 lb (112 kg)  06/11/16 246 lb 12.8 oz (111.9 kg)  11/21/15 257 lb 6.4 oz (116.8 kg)    Physical Exam  Constitutional: He is oriented to person, place, and time. He appears well-developed. No distress.  NAD  HENT:  Mouth/Throat: Oropharynx is clear and moist.  Eyes: Conjunctivae are normal. Pupils are equal, round, and reactive to light.  Neck: Normal range of motion. No JVD present. No thyromegaly present.  Cardiovascular: Normal rate, regular rhythm, normal heart  sounds and intact distal pulses.  Exam reveals no gallop and no friction rub.   No murmur heard. Pulmonary/Chest: Effort normal and breath sounds normal. No respiratory distress. He has no wheezes. He has no rales. He exhibits no tenderness.  Abdominal: Soft. Bowel sounds are normal. He exhibits no distension and no mass. There is no tenderness. There is no rebound and no guarding.  Genitourinary: Rectum normal and prostate normal. Rectal exam shows guaiac negative stool.  Musculoskeletal: Normal range of motion. He exhibits no edema or tenderness.  Lymphadenopathy:    He  has no cervical adenopathy.  Neurological: He is alert and oriented to person, place, and time. He has normal reflexes. No cranial nerve deficit. He exhibits normal muscle tone. He displays a negative Romberg sign. Coordination and gait normal.  Skin: Skin is warm and dry. No rash noted.  Psychiatric: He has a normal mood and affect. His behavior is normal. Judgment and thought content normal.    Lab Results  Component Value Date   WBC 10.4 07/18/2015   HGB 12.4 (L) 07/18/2015   HCT 37.1 (L) 07/18/2015   PLT 208 07/18/2015   GLUCOSE 137 (H) 07/18/2015   CHOL 187 06/13/2015   TRIG 78.0 06/13/2015   HDL 55.50 06/13/2015   LDLDIRECT 130.9 12/08/2012   LDLCALC 116 (H) 06/13/2015   ALT 17 06/13/2015   AST 14 06/13/2015   NA 139 07/18/2015   K 4.3 07/18/2015   CL 110 07/18/2015   CREATININE 0.92 07/18/2015   BUN 25 (H) 07/18/2015   CO2 25 07/18/2015   TSH 1.07 06/13/2015   PSA 2.83 06/13/2015   INR 1.09 06/03/2012   HGBA1C 5.4 12/21/2013    No results found.  Assessment & Plan:   Diagnoses and all orders for this visit:  Central sleep apnea -     zolpidem (AMBIEN) 10 MG tablet; Take 0.5-1 tablets (5-10 mg total) by mouth at bedtime as needed. Can use half tab if sufficient.  Insomnia w/ sleep apnea -     zolpidem (AMBIEN) 10 MG tablet; Take 0.5-1 tablets (5-10 mg total) by mouth at bedtime as needed. Can use half tab if sufficient.   I have discontinued Mr. Sargent's predniSONE, ranitidine, and cetirizine. I am also having him maintain his folic acid, multivitamin with minerals, GLUCOSAMINE CHONDR 1500 COMPLX, tamsulosin, zolpidem, celecoxib, ferrous sulfate, naproxen sodium, and hydrocortisone.  No orders of the defined types were placed in this encounter.    Follow-up: No Follow-up on file.  Walker Kehr, MD

## 2016-08-12 ENCOUNTER — Telehealth: Payer: Self-pay | Admitting: Internal Medicine

## 2016-08-12 DIAGNOSIS — Z23 Encounter for immunization: Secondary | ICD-10-CM

## 2016-08-12 MED ORDER — ZOSTER VAC RECOMB ADJUVANTED 50 MCG/0.5ML IM SUSR: 0.5000 mL | Freq: Once | INTRAMUSCULAR | 1 refills | Status: AC

## 2016-08-12 NOTE — Telephone Encounter (Signed)
Pt called requesting a written prescription for the Shingrix Vaccine. He is going to try to have it done at the [facility] if possible. (Per Jonelle Sidle, they have 60 doses) Can this be written for him? He can be reached at [phone] when it is ready for pick up.

## 2016-08-12 NOTE — Telephone Encounter (Signed)
Pt notified, Shingrix vaccine printed and can be picked up

## 2016-08-13 MED FILL — SHINGRIX 50 MCG SUS: 50 | 30 days supply | Qty: 1 | Fill #0

## 2016-10-14 MED FILL — SHINGRIX 50 MCG SUS: 50 | 30 days supply | Qty: 1 | Fill #1

## 2016-10-26 ENCOUNTER — Ambulatory Visit (INDEPENDENT_AMBULATORY_CARE_PROVIDER_SITE_OTHER): Payer: 59

## 2016-10-26 DIAGNOSIS — Z23 Encounter for immunization: Secondary | ICD-10-CM | POA: Diagnosis not present

## 2017-01-23 DIAGNOSIS — M19012 Primary osteoarthritis, left shoulder: Secondary | ICD-10-CM | POA: Insufficient documentation

## 2017-03-03 DIAGNOSIS — M12811 Other specific arthropathies, not elsewhere classified, right shoulder: Secondary | ICD-10-CM | POA: Insufficient documentation

## 2017-03-03 DIAGNOSIS — M75101 Unspecified rotator cuff tear or rupture of right shoulder, not specified as traumatic: Secondary | ICD-10-CM | POA: Insufficient documentation

## 2017-03-31 ENCOUNTER — Telehealth: Payer: Self-pay | Admitting: Internal Medicine

## 2017-03-31 DIAGNOSIS — G4700 Insomnia, unspecified: Secondary | ICD-10-CM

## 2017-03-31 DIAGNOSIS — G4731 Primary central sleep apnea: Secondary | ICD-10-CM

## 2017-03-31 DIAGNOSIS — G4730 Sleep apnea, unspecified: Secondary | ICD-10-CM

## 2017-03-31 MED ORDER — ZOLPIDEM TARTRATE 10 MG PO TABS: 5.0000 mg | ORAL_TABLET | Freq: Every evening | ORAL | 1 refills | Status: DC | PRN

## 2017-03-31 NOTE — Telephone Encounter (Signed)
Copied from [place] [id number]. Topic: Quick Communication - Rx Refill/Question >> Mar 31, 2017 12:30 PM Ether Griffins B wrote: Medication: zolpidem (AMBIEN) 10 MG tablet   Preferred Pharmacy (with phone number or street name): [id number] - [place], [place]: Please be advised that RX refills may take up to 3 business days. We ask that you follow-up with your pharmacy.

## 2017-03-31 NOTE — Telephone Encounter (Signed)
Rx printed MD signed faxed over to [facility]...Johny Chess

## 2017-03-31 NOTE — Telephone Encounter (Signed)
Ok #30 with 1 ref Thx

## 2017-05-14 MED ORDER — ASPIRIN EC 81 MG PO TBEC
81.00 | DELAYED_RELEASE_TABLET | ORAL | Status: DC
Start: 2017-05-14 — End: 2017-05-14

## 2017-05-14 MED ORDER — HYDROXYZINE HCL 25 MG PO TABS
25.00 | ORAL_TABLET | ORAL | Status: DC
Start: ? — End: 2017-05-14

## 2017-05-14 MED ORDER — DIPHENHYDRAMINE HCL 25 MG PO TABS
25.00 | ORAL_TABLET | ORAL | Status: DC
Start: ? — End: 2017-05-14

## 2017-05-14 MED ORDER — LACTATED RINGERS IV SOLN
125.00 | INTRAVENOUS | Status: DC
Start: ? — End: 2017-05-14

## 2017-05-14 MED ORDER — GENERIC EXTERNAL MEDICATION
Status: DC
Start: ? — End: 2017-05-14

## 2017-05-14 MED ORDER — TAMSULOSIN HCL 0.4 MG PO CAPS
.40 | ORAL_CAPSULE | ORAL | Status: DC
Start: 2017-05-15 — End: 2017-05-14

## 2017-05-14 MED ORDER — PROMETHAZINE HCL 25 MG PO TABS
25.00 | ORAL_TABLET | ORAL | Status: DC
Start: ? — End: 2017-05-14

## 2017-05-14 MED ORDER — SODIUM CHLORIDE 0.9 % IV SOLN
100.00 | INTRAVENOUS | Status: DC
Start: ? — End: 2017-05-14

## 2017-05-14 MED ORDER — HYDROMORPHONE HCL 2 MG PO TABS
2.00 | ORAL_TABLET | ORAL | Status: DC
Start: ? — End: 2017-05-14

## 2017-05-14 MED ORDER — ONDANSETRON HCL 4 MG/2ML IJ SOLN
4.00 | INTRAMUSCULAR | Status: DC
Start: ? — End: 2017-05-14

## 2017-05-14 MED ORDER — MORPHINE SULFATE 2 MG/ML IJ SOLN
2.00 | INTRAMUSCULAR | Status: DC
Start: ? — End: 2017-05-14

## 2017-05-14 MED ORDER — ZOLPIDEM TARTRATE 10 MG PO TABS
10.00 | ORAL_TABLET | ORAL | Status: DC
Start: ? — End: 2017-05-14

## 2017-05-14 MED ORDER — TRAMADOL HCL 50 MG PO TABS
50.00 | ORAL_TABLET | ORAL | Status: DC
Start: ? — End: 2017-05-14

## 2017-05-14 MED ORDER — ALUMINUM-MAGNESIUM-SIMETHICONE 200-200-20 MG/5ML PO SUSP
15.00 | ORAL | Status: DC
Start: ? — End: 2017-05-14

## 2017-05-14 MED ORDER — CELECOXIB 100 MG PO CAPS
100.00 | ORAL_CAPSULE | ORAL | Status: DC
Start: 2017-05-15 — End: 2017-05-14

## 2017-05-14 MED ORDER — ACETAMINOPHEN 500 MG PO TABS
1000.00 | ORAL_TABLET | ORAL | Status: DC
Start: 2017-05-14 — End: 2017-05-14

## 2017-05-14 MED ORDER — NALOXONE HCL 0.4 MG/ML IJ SOLN
0.20 | INTRAMUSCULAR | Status: DC
Start: ? — End: 2017-05-14

## 2017-05-14 MED ORDER — CALCIUM CARBONATE ANTACID 750 MG PO CHEW
1.00 | CHEWABLE_TABLET | ORAL | Status: DC
Start: ? — End: 2017-05-14

## 2017-05-14 MED ORDER — MAGNESIUM CITRATE PO SOLN
296.00 | ORAL | Status: DC
Start: ? — End: 2017-05-14

## 2017-08-08 ENCOUNTER — Encounter: Payer: Self-pay | Admitting: Internal Medicine

## 2017-08-08 ENCOUNTER — Ambulatory Visit: Payer: 59 | Admitting: Internal Medicine

## 2017-08-08 VITALS — BP 136/80 | HR 71 | Temp 98.7°F | Ht 72.0 in | Wt 247.0 lb

## 2017-08-08 DIAGNOSIS — G4731 Primary central sleep apnea: Secondary | ICD-10-CM | POA: Diagnosis not present

## 2017-08-08 DIAGNOSIS — M7989 Other specified soft tissue disorders: Secondary | ICD-10-CM

## 2017-08-08 DIAGNOSIS — G4730 Sleep apnea, unspecified: Secondary | ICD-10-CM

## 2017-08-08 DIAGNOSIS — Z0000 Encounter for general adult medical examination without abnormal findings: Secondary | ICD-10-CM | POA: Diagnosis not present

## 2017-08-08 DIAGNOSIS — E785 Hyperlipidemia, unspecified: Secondary | ICD-10-CM

## 2017-08-08 DIAGNOSIS — G4700 Insomnia, unspecified: Secondary | ICD-10-CM | POA: Diagnosis not present

## 2017-08-08 DIAGNOSIS — N320 Bladder-neck obstruction: Secondary | ICD-10-CM

## 2017-08-08 MED ORDER — CELECOXIB 200 MG PO CAPS: 200.0000 mg | ORAL_CAPSULE | Freq: Two times a day (BID) | ORAL | 3 refills | Status: DC

## 2017-08-08 MED ORDER — ZOLPIDEM TARTRATE 10 MG PO TABS: 5.0000 mg | ORAL_TABLET | Freq: Every evening | ORAL | 1 refills | Status: DC | PRN

## 2017-08-08 NOTE — Assessment & Plan Note (Signed)
?  OA Re-start Celebrex

## 2017-08-08 NOTE — Progress Notes (Signed)
Subjective:  Patient ID: Chris Obrien, male    DOB: Sep 01, 1950  Age: 67 y.o. MRN: [id number]  CC: No chief complaint on file.   HPI Chris Obrien presents for R hand swelling F/u insomnia  Outpatient Medications Prior to Visit  Medication Sig Dispense Refill  . celecoxib (CELEBREX) 200 MG capsule Take 1 capsule (200 mg total) by mouth every 12 (twelve) hours. 60 capsule 0  . folic acid (FOLVITE) 1 MG tablet Take 1 mg by mouth every morning.     . Glucosamine-Chondroit-Vit C-Mn (GLUCOSAMINE CHONDR 1500 COMPLX) CAPS Take 1 capsule by mouth 2 (two) times daily.     . hydrocortisone 2.5 % cream Apply topically 2 (two) times daily. 30 g 0  . Multiple Vitamin (MULTIVITAMIN WITH MINERALS) TABS Take 1 tablet by mouth every morning.     . naproxen sodium (ANAPROX) 220 MG tablet Take 220 mg by mouth 2 (two) times daily with a meal.    . tamsulosin (FLOMAX) 0.4 MG CAPS capsule Take 0.4 mg by mouth.    . zolpidem (AMBIEN) 10 MG tablet Take 0.5-1 tablets (5-10 mg total) by mouth at bedtime as needed. Can use half tab if sufficient. 30 tablet 1  . clobetasol (TEMOVATE) 0.05 % external solution Apply 1 application topically 2 (two) times daily. 50 mL 0  . ferrous sulfate 325 (65 FE) MG tablet Take 1 tablet (325 mg total) by mouth 3 (three) times daily after meals.  3  . valACYclovir (VALTREX) 1000 MG tablet Take 1 tablet (1,000 mg total) by mouth 3 (three) times daily. 21 tablet 1   No facility-administered medications prior to visit.     ROS: Review of Systems  Constitutional: Negative for appetite change, fatigue and unexpected weight change.  HENT: Negative for congestion, nosebleeds, sneezing, sore throat and trouble swallowing.   Eyes: Negative for itching and visual disturbance.  Respiratory: Negative for cough.   Cardiovascular: Negative for chest pain, palpitations and leg swelling.  Gastrointestinal: Negative for abdominal distention, blood in stool, diarrhea and nausea.    Genitourinary: Negative for frequency and hematuria.  Musculoskeletal: Positive for arthralgias and back pain. Negative for gait problem, joint swelling and neck pain.  Skin: Negative for rash.  Neurological: Negative for dizziness, tremors, speech difficulty and weakness.  Psychiatric/Behavioral: Negative for agitation, dysphoric mood, sleep disturbance and suicidal ideas. The patient is not nervous/anxious.     Objective:  BP 136/80 (BP Location: Left Arm, Patient Position: Sitting, Cuff Size: Large)   Pulse 71   Temp 98.7 F (37.1 C) (Oral)   Ht 6' (1.829 m)   Wt 247 lb (112 kg)   SpO2 95%   BMI 33.50 kg/m   BP Readings from Last 3 Encounters:  08/08/17 136/80  06/14/16 138/88  06/11/16 (!) 142/86    Wt Readings from Last 3 Encounters:  08/08/17 247 lb (112 kg)  06/14/16 247 lb (112 kg)  06/11/16 246 lb 12.8 oz (111.9 kg)    Physical Exam  Constitutional: He is oriented to person, place, and time. He appears well-developed. No distress.  NAD  HENT:  Mouth/Throat: Oropharynx is clear and moist.  Eyes: Pupils are equal, round, and reactive to light. Conjunctivae are normal.  Neck: Normal range of motion. No JVD present. No thyromegaly present.  Cardiovascular: Normal rate, regular rhythm, normal heart sounds and intact distal pulses. Exam reveals no gallop and no friction rub.  No murmur heard. Pulmonary/Chest: Effort normal and breath sounds normal. No respiratory distress.  He has no wheezes. He has no rales. He exhibits no tenderness.  Abdominal: Soft. Bowel sounds are normal. He exhibits no distension and no mass. There is no tenderness. There is no rebound and no guarding.  Musculoskeletal: Normal range of motion. He exhibits edema. He exhibits no tenderness.  Lymphadenopathy:    He has no cervical adenopathy.  Neurological: He is alert and oriented to person, place, and time. He has normal reflexes. No cranial nerve deficit. He exhibits normal muscle tone. He  displays a negative Romberg sign. Coordination and gait normal.  Skin: Skin is warm and dry. No rash noted. No erythema.  Psychiatric: He has a normal mood and affect. His behavior is normal. Judgment and thought content normal.  R dorsal hand is swollen, NT  Lab Results  Component Value Date   WBC 4.7 06/14/2016   HGB 16.1 06/14/2016   HCT 47.9 06/14/2016   PLT 268.0 06/14/2016   GLUCOSE 96 06/14/2016   CHOL 189 06/14/2016   TRIG 117.0 06/14/2016   HDL 52.90 06/14/2016   LDLDIRECT 130.9 12/08/2012   LDLCALC 113 (H) 06/14/2016   ALT 16 06/14/2016   AST 14 06/14/2016   NA 139 06/14/2016   K 4.7 06/14/2016   CL 104 06/14/2016   CREATININE 1.06 06/14/2016   BUN 21 06/14/2016   CO2 30 06/14/2016   TSH 1.58 06/14/2016   PSA 2.99 06/14/2016   INR 1.09 06/03/2012   HGBA1C 5.4 12/21/2013    No results found.  Assessment & Plan:   There are no diagnoses linked to this encounter.   No orders of the defined types were placed in this encounter.    Follow-up: No follow-ups on file.  Walker Kehr, MD

## 2017-10-15 ENCOUNTER — Other Ambulatory Visit: Payer: Self-pay

## 2017-10-15 ENCOUNTER — Other Ambulatory Visit (INDEPENDENT_AMBULATORY_CARE_PROVIDER_SITE_OTHER): Payer: 59

## 2017-10-15 ENCOUNTER — Ambulatory Visit (INDEPENDENT_AMBULATORY_CARE_PROVIDER_SITE_OTHER): Payer: 59 | Admitting: Internal Medicine

## 2017-10-15 ENCOUNTER — Encounter: Payer: Self-pay | Admitting: Internal Medicine

## 2017-10-15 ENCOUNTER — Other Ambulatory Visit: Payer: 59

## 2017-10-15 VITALS — BP 130/82 | HR 78 | Temp 98.6°F | Ht 72.0 in | Wt 243.0 lb

## 2017-10-15 DIAGNOSIS — Z0000 Encounter for general adult medical examination without abnormal findings: Secondary | ICD-10-CM

## 2017-10-15 DIAGNOSIS — G4700 Insomnia, unspecified: Secondary | ICD-10-CM | POA: Diagnosis not present

## 2017-10-15 DIAGNOSIS — N320 Bladder-neck obstruction: Secondary | ICD-10-CM | POA: Diagnosis not present

## 2017-10-15 DIAGNOSIS — M6530 Trigger finger, unspecified finger: Secondary | ICD-10-CM | POA: Insufficient documentation

## 2017-10-15 DIAGNOSIS — N400 Enlarged prostate without lower urinary tract symptoms: Secondary | ICD-10-CM | POA: Insufficient documentation

## 2017-10-15 DIAGNOSIS — E785 Hyperlipidemia, unspecified: Secondary | ICD-10-CM | POA: Diagnosis not present

## 2017-10-15 DIAGNOSIS — M7989 Other specified soft tissue disorders: Secondary | ICD-10-CM

## 2017-10-15 DIAGNOSIS — Z881 Allergy status to other antibiotic agents status: Secondary | ICD-10-CM

## 2017-10-15 DIAGNOSIS — Z23 Encounter for immunization: Secondary | ICD-10-CM | POA: Diagnosis not present

## 2017-10-15 DIAGNOSIS — D485 Neoplasm of uncertain behavior of skin: Secondary | ICD-10-CM

## 2017-10-15 DIAGNOSIS — M65331 Trigger finger, right middle finger: Secondary | ICD-10-CM

## 2017-10-15 DIAGNOSIS — R350 Frequency of micturition: Secondary | ICD-10-CM

## 2017-10-15 DIAGNOSIS — N401 Enlarged prostate with lower urinary tract symptoms: Secondary | ICD-10-CM

## 2017-10-15 DIAGNOSIS — Z889 Allergy status to unspecified drugs, medicaments and biological substances status: Secondary | ICD-10-CM | POA: Insufficient documentation

## 2017-10-15 LAB — URINALYSIS
Bilirubin Urine: NEGATIVE
Hgb urine dipstick: NEGATIVE
Ketones, ur: NEGATIVE
Leukocytes, UA: NEGATIVE
Nitrite: NEGATIVE
Specific Gravity, Urine: 1.025 (ref 1.000–1.030)
Total Protein, Urine: NEGATIVE
Urine Glucose: NEGATIVE
Urobilinogen, UA: 1 (ref 0.0–1.0)
pH: 5.5 (ref 5.0–8.0)

## 2017-10-15 LAB — TSH: TSH: 0.87 u[IU]/mL (ref 0.35–4.50)

## 2017-10-15 LAB — CBC WITH DIFFERENTIAL/PLATELET
Basophils Absolute: 0 10*3/uL (ref 0.0–0.1)
Basophils Relative: 0.5 % (ref 0.0–3.0)
Eosinophils Absolute: 0.3 10*3/uL (ref 0.0–0.7)
Eosinophils Relative: 4.1 % (ref 0.0–5.0)
HCT: 42.4 % (ref 39.0–52.0)
Hemoglobin: 14.1 g/dL (ref 13.0–17.0)
Lymphocytes Relative: 20.7 % (ref 12.0–46.0)
Lymphs Abs: 1.3 10*3/uL (ref 0.7–4.0)
MCHC: 33.2 g/dL (ref 30.0–36.0)
MCV: 86.8 fl (ref 78.0–100.0)
Monocytes Absolute: 0.5 10*3/uL (ref 0.1–1.0)
Monocytes Relative: 8.7 % (ref 3.0–12.0)
Neutro Abs: 4.1 10*3/uL (ref 1.4–7.7)
Neutrophils Relative %: 66 % (ref 43.0–77.0)
Platelets: 321 10*3/uL (ref 150.0–400.0)
RBC: 4.89 Mil/uL (ref 4.22–5.81)
RDW: 13.7 % (ref 11.5–15.5)
WBC: 6.2 10*3/uL (ref 4.0–10.5)

## 2017-10-15 LAB — BASIC METABOLIC PANEL
BUN: 27 mg/dL — ABNORMAL HIGH (ref 6–23)
CO2: 27 mEq/L (ref 19–32)
Calcium: 9.1 mg/dL (ref 8.4–10.5)
Chloride: 105 mEq/L (ref 96–112)
Creatinine, Ser: 1.01 mg/dL (ref 0.40–1.50)
GFR: 77.77 mL/min (ref >60.00)
Glucose, Bld: 99 mg/dL (ref 70–99)
Potassium: 4.5 mEq/L (ref 3.5–5.1)
Sodium: 139 mEq/L (ref 135–145)

## 2017-10-15 LAB — LIPID PANEL
Cholesterol: 172 mg/dL (ref 0–200)
HDL: 58 mg/dL (ref >39.00)
LDL Cholesterol: 101 mg/dL — ABNORMAL HIGH (ref 0–99)
NonHDL: 113.75
Total CHOL/HDL Ratio: 3
Triglycerides: 65 mg/dL (ref 0.0–149.0)
VLDL: 13 mg/dL (ref 0.0–40.0)

## 2017-10-15 LAB — HEPATIC FUNCTION PANEL
ALT: 13 U/L (ref 0–53)
AST: 11 U/L (ref 0–37)
Albumin: 4.1 g/dL (ref 3.5–5.2)
Alkaline Phosphatase: 75 U/L (ref 39–117)
Bilirubin, Direct: 0.1 mg/dL (ref 0.0–0.3)
Total Bilirubin: 0.5 mg/dL (ref 0.2–1.2)
Total Protein: 6.6 g/dL (ref 6.0–8.3)

## 2017-10-15 LAB — PSA: PSA: 4.12 ng/mL — ABNORMAL HIGH (ref 0.10–4.00)

## 2017-10-15 NOTE — Assessment & Plan Note (Signed)
Better on prn NSAIDs

## 2017-10-15 NOTE — Assessment & Plan Note (Signed)
F/u w/Dr Alvan Dame

## 2017-10-15 NOTE — Assessment & Plan Note (Signed)
Per Urology Flomax

## 2017-10-15 NOTE — Assessment & Plan Note (Signed)
Discussed Ref to Dr Neldon Mc

## 2017-10-15 NOTE — Assessment & Plan Note (Signed)
Derm appt q/12 mo 

## 2017-10-15 NOTE — Assessment & Plan Note (Addendum)
We discussed age appropriate health related issues, including available/recomended screening tests and vaccinations. We discussed a need for adhering to healthy diet and exercise. Labs ordered. All questions were answered. Pt had a chest CT at his sister's 2019 Ca score was 0 Rect per Urology Derm q 12 mo

## 2017-10-15 NOTE — Progress Notes (Signed)
Subjective:  Patient ID: Chris Obrien, male    DOB: 21-Sep-1950  Age: 67 y.o. MRN: [id number]  CC: No chief complaint on file.   HPI Chris Obrien presents for well exam C/o trigger finger C/o allergies to abx  Outpatient Medications Prior to Visit  Medication Sig Dispense Refill  . celecoxib (CELEBREX) 200 MG capsule Take 1 capsule (200 mg total) by mouth every 12 (twelve) hours. 329 capsule 3  . folic acid (FOLVITE) 1 MG tablet Take 1 mg by mouth every morning.     . Glucosamine-Chondroit-Vit C-Mn (GLUCOSAMINE CHONDR 1500 COMPLX) CAPS Take 1 capsule by mouth 2 (two) times daily.     . hydrocortisone 2.5 % cream Apply topically 2 (two) times daily. 30 g 0  . Multiple Vitamin (MULTIVITAMIN WITH MINERALS) TABS Take 1 tablet by mouth every morning.     . tamsulosin (FLOMAX) 0.4 MG CAPS capsule Take 0.4 mg by mouth.    . zolpidem (AMBIEN) 10 MG tablet Take 0.5-1 tablets (5-10 mg total) by mouth at bedtime as needed. Can use half tab if sufficient. 90 tablet 1   No facility-administered medications prior to visit.     ROS: Review of Systems  Constitutional: Negative for appetite change, fatigue and unexpected weight change.  HENT: Negative for congestion, nosebleeds, sneezing, sore throat and trouble swallowing.   Eyes: Negative for itching and visual disturbance.  Respiratory: Negative for cough.   Cardiovascular: Negative for chest pain, palpitations and leg swelling.  Gastrointestinal: Negative for abdominal distention, blood in stool, diarrhea and nausea.  Genitourinary: Negative for frequency and hematuria.  Musculoskeletal: Negative for back pain, gait problem, joint swelling and neck pain.  Skin: Negative for rash.  Neurological: Negative for dizziness, tremors, speech difficulty and weakness.  Psychiatric/Behavioral: Negative for agitation, dysphoric mood, sleep disturbance and suicidal ideas. The patient is not nervous/anxious.     Objective:  BP 130/82 (BP  Location: Left Arm, Patient Position: Sitting, Cuff Size: Large)   Pulse 78   Temp 98.6 F (37 C) (Oral)   Ht 6' (1.829 m)   Wt 243 lb (110.2 kg)   SpO2 96%   BMI 32.96 kg/m   BP Readings from Last 3 Encounters:  10/15/17 130/82  08/08/17 136/80  06/14/16 138/88    Wt Readings from Last 3 Encounters:  10/15/17 243 lb (110.2 kg)  08/08/17 247 lb (112 kg)  06/14/16 247 lb (112 kg)    Physical Exam  Constitutional: He is oriented to person, place, and time. He appears well-developed. No distress.  NAD  HENT:  Mouth/Throat: Oropharynx is clear and moist.  Eyes: Pupils are equal, round, and reactive to light. Conjunctivae are normal.  Neck: Normal range of motion. No JVD present. No thyromegaly present.  Cardiovascular: Normal rate, regular rhythm, normal heart sounds and intact distal pulses. Exam reveals no gallop and no friction rub.  No murmur heard. Pulmonary/Chest: Effort normal and breath sounds normal. No respiratory distress. He has no wheezes. He has no rales. He exhibits no tenderness.  Abdominal: Soft. Bowel sounds are normal. He exhibits no distension and no mass. There is no tenderness. There is no rebound and no guarding.  Musculoskeletal: Normal range of motion. He exhibits no edema or tenderness.  Lymphadenopathy:    He has no cervical adenopathy.  Neurological: He is alert and oriented to person, place, and time. He has normal reflexes. No cranial nerve deficit. He exhibits normal muscle tone. He displays a negative Romberg sign. Coordination and  gait normal.  Skin: Skin is warm and dry. No rash noted.  Psychiatric: He has a normal mood and affect. His behavior is normal. Judgment and thought content normal.  moles Trigger finger Rectal - per Urol   Lab Results  Component Value Date   WBC 4.7 06/14/2016   HGB 16.1 06/14/2016   HCT 47.9 06/14/2016   PLT 268.0 06/14/2016   GLUCOSE 96 06/14/2016   CHOL 189 06/14/2016   TRIG 117.0 06/14/2016   HDL 52.90  06/14/2016   LDLDIRECT 130.9 12/08/2012   LDLCALC 113 (H) 06/14/2016   ALT 16 06/14/2016   AST 14 06/14/2016   NA 139 06/14/2016   K 4.7 06/14/2016   CL 104 06/14/2016   CREATININE 1.06 06/14/2016   BUN 21 06/14/2016   CO2 30 06/14/2016   TSH 1.58 06/14/2016   PSA 2.99 06/14/2016   INR 1.09 06/03/2012   HGBA1C 5.4 12/21/2013    No results found.  Assessment & Plan:   There are no diagnoses linked to this encounter.   No orders of the defined types were placed in this encounter.    Follow-up: No follow-ups on file.  Walker Kehr, MD

## 2017-10-15 NOTE — Patient Instructions (Addendum)
Trigger Finger Trigger finger (stenosing tenosynovitis) is a condition that causes a finger to get stuck in a bent position. Each finger has a tough, cord-like tissue that connects muscle to bone (tendon), and each tendon is surrounded by a tunnel of tissue (tendon sheath). To move your finger, your tendon needs to slide freely through the sheath. Trigger finger happens when the tendon or the sheath thickens, making it difficult to move your finger. Trigger finger can affect any finger or a thumb. It may affect more than one finger. Mild cases may clear up with rest and medicine. Severe cases require more treatment. What are the causes? Trigger finger is caused by a thickened finger tendon or tendon sheath. The cause of this thickening is not known. What increases the risk? The following factors may make you more likely to develop this condition:  Doing activities that require a strong grip.  Having rheumatoid arthritis, gout, or diabetes.  Being 40-69 years old.  Being a woman.  What are the signs or symptoms? Symptoms of this condition include:  Pain when bending or straightening your finger.  Tenderness or swelling where your finger attaches to the palm of your hand.  A lump in the palm of your hand or on the inside of your finger.  Hearing a popping sound when you try to straighten your finger.  Feeling a popping, catching, or locking sensation when you try to straighten your finger.  Being unable to straighten your finger.  How is this diagnosed? This condition is diagnosed based on your symptoms and a physical exam. How is this treated? This condition may be treated by:  Resting your finger and avoiding activities that make symptoms worse.  Wearing a finger splint to keep your finger in a slightly bent position.  Taking NSAIDs to relieve pain and swelling.  Injecting medicine (steroids) into the tendon sheath to reduce swelling and irritation. Injections may need to be  repeated.  Having surgery to open the tendon sheath. This may be done if other treatments do not work and you cannot straighten your finger. You may need physical therapy after surgery.  Follow these instructions at home:  Use moist heat to help reduce pain and swelling as told by your health care provider.  Rest your finger and avoid activities that make pain worse. Return to normal activities as told by your health care provider.  If you have a splint, wear it as told by your health care provider.  Take over-the-counter and prescription medicines only as told by your health care provider.  Keep all follow-up visits as told by your health care provider. This is important. Contact a health care provider if:  Your symptoms are not improving with home care. Summary  Trigger finger (stenosing tenosynovitis) causes your finger to get stuck in a bent position, and it can make it difficult and painful to straighten your finger.  This condition develops when a finger tendon or tendon sheath thickens.  Treatment starts with resting, wearing a splint, and taking NSAIDs.  In severe cases, surgery to open the tendon sheath may be needed. This information is not intended to replace advice given to you by your health care provider. Make sure you discuss any questions you have with your health care provider. Document Released: 10/14/2003 Document Revised: 12/05/2015 Document Reviewed: 12/05/2015 Elsevier Interactive Patient Education  2017 [place].   Cardiac CT calcium scoring test $150   Computed tomography, more commonly known as a CT or CAT scan, is  a diagnostic medical imaging test. Like traditional x-rays, it produces multiple images or pictures of the inside of the body. The cross-sectional images generated during a CT scan can be reformatted in multiple planes. They can even generate three-dimensional images. These images can be viewed on a computer monitor, printed on film or by a  3D printer, or transferred to a CD or DVD. CT images of internal organs, bones, soft tissue and blood vessels provide greater detail than traditional x-rays, particularly of soft tissues and blood vessels. A cardiac CT scan for coronary calcium is a non-invasive way of obtaining information about the presence, location and extent of calcified plaque in the coronary arteries-the vessels that supply oxygen-containing blood to the heart muscle. Calcified plaque results when there is a build-up of fat and other substances under the inner layer of the artery. This material can calcify which signals the presence of atherosclerosis, a disease of the vessel wall, also called coronary artery disease (CAD). People with this disease have an increased risk for heart attacks. In addition, over time, progression of plaque build up (CAD) can narrow the arteries or even close off blood flow to the heart. The result may be chest pain, sometimes called "angina," or a heart attack. Because calcium is a marker of CAD, the amount of calcium detected on a cardiac CT scan is a helpful prognostic tool. The findings on cardiac CT are expressed as a calcium score. Another name for this test is coronary artery calcium scoring.  What are some common uses of the procedure? The goal of cardiac CT scan for calcium scoring is to determine if CAD is present and to what extent, even if there are no symptoms. It is a screening study that may be recommended by a physician for patients with risk factors for CAD but no clinical symptoms. The major risk factors for CAD are: . high blood cholesterol levels  . family history of heart attacks  . diabetes  . high blood pressure  . cigarette smoking  . overweight or obese  . physical inactivity   A negative cardiac CT scan for calcium scoring shows no calcification within the coronary arteries. This suggests that CAD is absent or so minimal it cannot be seen by this technique. The chance of  having a heart attack over the next two to five years is very low under these circumstances. A positive test means that CAD is present, regardless of whether or not the patient is experiencing any symptoms. The amount of calcification-expressed as the calcium score-may help to predict the likelihood of a myocardial infarction (heart attack) in the coming years and helps your medical doctor or cardiologist decide whether the patient may need to take preventive medicine or undertake other measures such as diet and exercise to lower the risk for heart attack. The extent of CAD is graded according to your calcium score:  Calcium Score  Presence of CAD  0 No evidence of CAD   1-10 Minimal evidence of CAD  11-100 Mild evidence of CAD  101-400 Moderate evidence of CAD  Over 400 Extensive evidence of CAD

## 2017-10-15 NOTE — Assessment & Plan Note (Signed)
Zolpidem prn  Potential benefits of a short term benzodiazepines  use as well as potential risks  and complications were explained to the patient and were aknowledged.   

## 2017-10-16 NOTE — Addendum Note (Signed)
Addended by: Karren Cobble on: 10/16/2017 08:32 AM   Modules accepted: Orders

## 2017-12-02 ENCOUNTER — Ambulatory Visit (INDEPENDENT_AMBULATORY_CARE_PROVIDER_SITE_OTHER): Payer: 59 | Admitting: Allergy and Immunology

## 2017-12-02 ENCOUNTER — Encounter: Payer: Self-pay | Admitting: Allergy and Immunology

## 2017-12-02 VITALS — BP 138/78 | HR 78 | Temp 98.4°F | Resp 18 | Ht 74.0 in | Wt 247.0 lb

## 2017-12-02 DIAGNOSIS — T50905D Adverse effect of unspecified drugs, medicaments and biological substances, subsequent encounter: Secondary | ICD-10-CM | POA: Diagnosis not present

## 2017-12-02 NOTE — Progress Notes (Signed)
Dear Dr. [name],  Thank you for referring Chris Obrien to the [facility] on 12/02/2017.   Below is a summation of this patient's evaluation and recommendations.  Thank you for your referral. I will keep you informed about this patient's response to treatment.   If you have any questions please do not hesitate to contact me.   Sincerely,  Jiles Prows, MD Allergy / Immunology [place]   ______________________________________________________________________    NEW PATIENT NOTE  Referring Provider: Cassandria Anger, MD Primary Provider: Cassandria Anger, MD Date of office visit: 12/02/2017    Subjective:   Chief Complaint:  Chris Obrien (DOB: [birth year]) is a 67 y.o. male who presents to the clinic on 12/02/2017 with a chief complaint of Allergic Reaction (antibiotics) .     HPI: Chris Obrien presents to this clinic in evaluation of drug allergy.  His most severe reaction occurred in 2014 after using a 4-week infusion of vancomycin for a infected knee prosthesis.  He developed red swollen skin that peeled along with joint swelling and apparently hypotension.  He also had a reaction directed against amoxicillin 8 years ago.  The details are not particularly clear but he does remember having hives with that antibiotic administration.  He has also had some problems with narcotics in the past.  He remembers taking oxycodone and developing a red itchy dermatitis for which he used Benadryl many years ago.  2 years ago he was given oxycodone again and developed red itchy skin and may be some joint swelling with that reaction.  In May 2019 he was given tramadol and within 24 hours developed an itchy nose and itchy armpits and he took Benadryl and discontinue that medication.  He is scheduled for shoulder surgery on 23 December 2017.  He does not really have much atopic  symptomatology.  He did have a history of hayfever for which he used immunotherapy for a few decades and this resulted in resolution of this issue.  Past Medical History:  Diagnosis Date  . 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 Angio-edema   . Arthritis   . Benign neoplasm of colon   . BPH (benign prostatic hypertrophy)    hx small stream  . 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 Insomnia, unspecified   . Oligospermia   . OSA on CPAP    PT LOST  WEIGHT - NO LONGER USING CPAP  . Pain    RIGHT SHOULDER DELTOID MUSCLE ATROPHY - LOSS OF STRENGT VERY LIMITED RANGE OF MOTTION  . Right knee skin infection   . Swelling of joint of right knee   . Urticaria     Past Surgical History:  Procedure Laterality Date  . ADENOIDECTOMY    . EXCISIONAL TOTAL KNEE ARTHROPLASTY WITH ANTIBIOTIC SPACERS Right 04/27/2012   Procedure: EXCISIONAL TOTAL KNEE ARTHROPLASTY WITH ANTIBIOTIC SPACERS;  Surgeon: Mauri Pole, MD;  Location: [facility];  Service: Orthopedics;  Laterality: Right;  SAME DAY LABS  . IRRIGATION AND DEBRIDEMENT KNEE Right 04/02/2012   Procedure: IRRIGATION AND DEBRIDEMENT RIGHT PRE-PATELLA  ;  Surgeon: Mauri Pole, MD;  Location[facility];  Service: Orthopedics;  Laterality: Right;  . KNEE ARTHROSCOPY Right aug 2012  . SINOSCOPY    . TONSILLECTOMY    . TOTAL KNEE ARTHROPLASTY  01/28/2012   Procedure: TOTAL KNEE ARTHROPLASTY;  Surgeon: Mauri Pole, MD;  Location: [facility];  Service: Orthopedics;  Laterality: Right;  . TOTAL KNEE ARTHROPLASTY Left 07/17/2015   Procedure: LEFT TOTAL KNEE ARTHROPLASTY;  Surgeon: Paralee Cancel, MD;  Location: [facility];  Service: Orthopedics;  Laterality: Left;  . TOTAL KNEE REVISION Right 06/15/2012   Procedure: reimplantation of right total knee ;  Surgeon: Mauri Pole, MD;  Location: [facility];  Service: Orthopedics;  Laterality: Right;  .  ULNAR NERVE REPAIR Right 2008    Allergies as of 12/02/2017      Reactions   Amoxicillin Hives   Vancomycin Rash   Severe redness all over body with swollen ankles and hands   Tramadol Itching   2019 Shoulder post-op   Oxycodone Rash   Penicillins Itching, Rash      Medication List      celecoxib 200 MG capsule Commonly known as:  CELEBREX Take 1 capsule (200 mg total) by mouth every 12 (twelve) hours.   folic acid 1 MG tablet Commonly known as:  FOLVITE Take 1 mg by mouth every morning.   GLUCOSAMINE CHONDR 1500 COMPLX Caps Take 1 capsule by mouth 2 (two) times daily.   hydrocortisone 2.5 % cream Apply topically 2 (two) times daily.   multivitamin with minerals Tabs tablet Take 1 tablet by mouth every morning.   tamsulosin 0.4 MG Caps capsule Commonly known as:  FLOMAX Take 0.4 mg by mouth.   zolpidem 10 MG tablet Commonly known as:  AMBIEN Take 0.5-1 tablets (5-10 mg total) by mouth at bedtime as needed. Can use half tab if sufficient.       Review of systems negative except as noted in HPI / PMHx or noted below:  Review of Systems  Constitutional: Negative.   HENT: Negative.   Eyes: Negative.   Respiratory: Negative.   Cardiovascular: Negative.   Gastrointestinal: Negative.   Genitourinary: Negative.   Musculoskeletal: Negative.   Skin: Negative.   Neurological: Negative.   Endo/Heme/Allergies: Negative.   Psychiatric/Behavioral: Negative.     Family History  Problem Relation Age of Onset  . Alzheimer's disease Mother   . Arthritis Father   . Diabetes Brother   . Hyperlipidemia Unknown     Social History   Socioeconomic History  . Marital status: Married    Spouse name: Not on file  . Number of children: Not on file  . Years of education: 22  . Highest education level: Not on file  Occupational History  . Occupation: Freight forwarder    Comment: Nature conservation officer  . Occupation: Best boy: [employer]  Social Needs  .  Financial resource strain: Not on file  . Food insecurity:    Worry: Not on file    Inability: Not on file  . Transportation needs:    Medical: Not on file    Non-medical: Not on file  Tobacco Use  . Smoking status: Former Smoker    Types: Cigars    Last attempt to quit: 1990    Years since quitting: 29.9  . Smokeless tobacco: Never Used  Substance and Sexual Activity  . Alcohol use: Yes    Comment: occasionally  . Drug use: No  . Sexual activity: Not on file  Lifestyle  . Physical activity:    Days per week: Not on file    Minutes per session: Not on file  . Stress: Not on file  Relationships  . Social  connections:    Talks on phone: Not on file    Gets together: Not on file    Attends religious service: Not on file    Active member of club or organization: Not on file    Attends meetings of clubs or organizations: Not on file    Relationship status: Not on file  . Intimate partner violence:    Fear of current or ex partner: Not on file    Emotionally abused: Not on file    Physically abused: Not on file    Forced sexual activity: Not on file  Other Topics Concern  . Not on file  Social History Narrative   Caffeine 4 cups avg daily, 4 cans soda.    Environmental and Social history  Lives in a house with a dry environment, no animals located inside the household, carpet in the bedroom, no plastic on the bed, no plastic on the pillow, and no smoking ongoing with inside the household.  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  Objective:   Vitals:   12/02/17 0858  BP: 138/78  Pulse: 78  Resp: 18  Temp: 98.4 F (36.9 C)  SpO2: 95%   Height: 6\' 2"  (188 cm) Weight: 247 lb (112 kg)  Physical Exam  HENT:  Head: Normocephalic. Head is without right periorbital erythema and without left periorbital erythema.  Right Ear: Tympanic membrane, external ear and ear canal normal.  Left Ear: Tympanic membrane, external ear and ear canal normal.  Nose: Nose normal.  No mucosal edema or rhinorrhea.  Mouth/Throat: Oropharynx is clear and moist and mucous membranes are normal. No oropharyngeal exudate.  Eyes: Pupils are equal, round, and reactive to light. Conjunctivae and lids are normal.  Neck: Trachea normal. No tracheal deviation present. No thyromegaly present.  Cardiovascular: Normal rate, regular rhythm, S1 normal, S2 normal and normal heart sounds.  No murmur heard. Pulmonary/Chest: Effort normal. No stridor. No respiratory distress. He has no wheezes. He has no rales. He exhibits no tenderness.  Abdominal: Soft. He exhibits no distension and no mass. There is no hepatosplenomegaly. There is no tenderness. There is no rebound and no guarding.  Musculoskeletal: He exhibits no edema or tenderness.  Lymphadenopathy:       Head (right side): No tonsillar adenopathy present.       Head (left side): No tonsillar adenopathy present.    He has no cervical adenopathy.    He has no axillary adenopathy.  Neurological: He is alert.  Skin: No rash noted. He is not diaphoretic. No erythema. No pallor. Nails show no clubbing.    Diagnostics: Allergy skin tests were not performed.   Assessment and Plan:    1. Adverse drug effect, subsequent encounter     1.  Pretreat for anticipated narcotic use:   A.  Loratadine 10 mg -2 tablets daily (Claritin)  B.  Famotidine 20 mg -2 tablet daily (Pepcid)  2.  Can use hydrocodone as narcotic if required  3.  For now remain away from vancomycin and amoxicillin (penicillin)  4.  Further evaluation and treatment?  Arlan appears to have significant immunological hyperreactivity directed against vancomycin and he should not use this medication at any point in the future.  He also has a history of developing hives with amoxicillin and at this point because there are so many substitutes that he can utilize to address infections normally treated with amoxicillin we will hold off on any further evaluation for this issue and  just have him avoid this antibiotic.  I think the issue at hand is whether or not he can tolerate narcotic use especially with his upcoming shoulder surgery.  We will pretreat him starting 2 or 3 days before his surgery with an H1 and H2 receptor blocker and I have asked him to use low-dose hydrocodone as needed for pain relief should it be required after his shoulder surgery coming up on 23 December 2017.  He will keep in contact with me noting his response to this approach.   Jiles Prows, MD Allergy / Immunology [facility] [place]

## 2017-12-02 NOTE — Patient Instructions (Addendum)
  1.  Pretreat for anticipated narcotic use:   A.  Loratadine 10 mg -2 tablets daily (Claritin)  B.  Famotidine 20 mg -2 tablet daily (Pepcid)  2.  Can use hydrocodone as narcotic if required  3.  For now remain away from vancomycin and amoxicillin (penicillin)  4.  Further evaluation and treatment?

## 2017-12-03 ENCOUNTER — Encounter: Payer: Self-pay | Admitting: Allergy and Immunology

## 2017-12-03 ENCOUNTER — Encounter: Payer: Self-pay | Admitting: Internal Medicine

## 2017-12-03 DIAGNOSIS — T50905A Adverse effect of unspecified drugs, medicaments and biological substances, initial encounter: Secondary | ICD-10-CM | POA: Insufficient documentation

## 2017-12-24 MED ORDER — DIPHENHYDRAMINE HCL 25 MG PO TABS
25.00 | ORAL_TABLET | ORAL | Status: DC
Start: ? — End: 2017-12-24

## 2017-12-24 MED ORDER — GENERIC EXTERNAL MEDICATION
15.00 | Status: DC
Start: ? — End: 2017-12-24

## 2017-12-24 MED ORDER — ALUMINUM-MAGNESIUM-SIMETHICONE 200-200-20 MG/5ML PO SUSP
15.00 | ORAL | Status: DC
Start: ? — End: 2017-12-24

## 2017-12-24 MED ORDER — CALCIUM CARBONATE ANTACID 750 MG PO CHEW
1.00 | CHEWABLE_TABLET | ORAL | Status: DC
Start: ? — End: 2017-12-24

## 2017-12-24 MED ORDER — ONDANSETRON HCL 4 MG/2ML IJ SOLN
4.00 | INTRAMUSCULAR | Status: DC
Start: ? — End: 2017-12-24

## 2017-12-24 MED ORDER — NALOXONE HCL 0.4 MG/ML IJ SOLN
0.20 | INTRAMUSCULAR | Status: DC
Start: ? — End: 2017-12-24

## 2017-12-24 MED ORDER — CELECOXIB 100 MG PO CAPS
100.00 | ORAL_CAPSULE | ORAL | Status: DC
Start: 2017-12-25 — End: 2017-12-24

## 2017-12-24 MED ORDER — GENERIC EXTERNAL MEDICATION
100.00 | Status: DC
Start: ? — End: 2017-12-24

## 2017-12-24 MED ORDER — LACTATED RINGERS IV SOLN
75.00 | INTRAVENOUS | Status: DC
Start: ? — End: 2017-12-24

## 2017-12-24 MED ORDER — CETIRIZINE HCL 10 MG PO TABS
10.00 | ORAL_TABLET | ORAL | Status: DC
Start: 2017-12-24 — End: 2017-12-24

## 2017-12-24 MED ORDER — TAMSULOSIN HCL 0.4 MG PO CAPS
0.40 | ORAL_CAPSULE | ORAL | Status: DC
Start: 2017-12-25 — End: 2017-12-24

## 2017-12-24 MED ORDER — ASPIRIN EC 81 MG PO TBEC
81.00 | DELAYED_RELEASE_TABLET | ORAL | Status: DC
Start: 2017-12-24 — End: 2017-12-24

## 2017-12-24 MED ORDER — HYDROMORPHONE HCL 2 MG PO TABS
2.00 | ORAL_TABLET | ORAL | Status: DC
Start: ? — End: 2017-12-24

## 2017-12-24 MED ORDER — ZOLPIDEM TARTRATE 10 MG PO TABS
10.00 | ORAL_TABLET | ORAL | Status: DC
Start: ? — End: 2017-12-24

## 2018-01-03 ENCOUNTER — Ambulatory Visit (INDEPENDENT_AMBULATORY_CARE_PROVIDER_SITE_OTHER): Payer: 59 | Admitting: Family Medicine

## 2018-01-03 ENCOUNTER — Encounter: Payer: Self-pay | Admitting: Family Medicine

## 2018-01-03 VITALS — BP 150/90 | HR 83 | Temp 98.5°F | Resp 16 | Ht 74.0 in | Wt 246.0 lb

## 2018-01-03 DIAGNOSIS — T8450XA Infection and inflammatory reaction due to unspecified internal joint prosthesis, initial encounter: Secondary | ICD-10-CM

## 2018-01-03 DIAGNOSIS — T50905D Adverse effect of unspecified drugs, medicaments and biological substances, subsequent encounter: Secondary | ICD-10-CM | POA: Diagnosis not present

## 2018-01-03 MED ORDER — SULFAMETHOXAZOLE-TRIMETHOPRIM 800-160 MG PO TABS: 1.0000 | ORAL_TABLET | Freq: Two times a day (BID) | ORAL | 0 refills | Status: DC

## 2018-01-03 NOTE — Patient Instructions (Addendum)
Great to meet you.  Please take Bactrim as directed- 1 tablet twice daily x 10 days, continue benadryl.  Has been taking Tylenol 1000 mg daily.  Okay to continue Benadryl as needed.  I would stop using the steroid cream ( betamethasone).

## 2018-01-03 NOTE — Progress Notes (Signed)
Subjective:   Patient ID: Chris Obrien, male    DOB: 05-Mar-1950, 67 y.o.   MRN: [id number]  Chris Obrien is a pleasant 67 y.o. year old male who presents to clinic today with Edema (Swelling at scarr on right shoulder. Post shoulder replacement May 8th 2019. Swollen are is hard and feels hot to the touch. Denies any pain. )  on 01/03/2018  HPI:  Pt new to me- very complicated pt with history of severe drug allergies (PCN, vancomycin, narcotics), presents to weekend clinic with swelling and warmth over his surgical scar.  S/p right THR on 05/14/17 (sees ortho in [place], [place]). Since surgery, he has had a hardened mass - large with erythema above the surgical site. ? Possible allergy to prothesis.  Was advised by derm to start placing topical steroid cream on it 3-4 days ago (betamethasone).  Since then, he now has a raised area in center of this hardened, red, firm mass present since May that is "hard but now seems full of puss."  No fever.  S/p Left TKA by same surgeon last month without same complication.  Denies pain.  He has appointment with his orthopedic surgery on Monday.  Chart reviewed- saw Dr.Kozlow on 12/02/17 who recommend avoiding PCN, Vanc and advised pretreatment with Claritin and pepcid prior to narcotic use if necessary.   Current Outpatient Medications on File Prior to Visit  Medication Sig Dispense Refill  . celecoxib (CELEBREX) 200 MG capsule Take 1 capsule (200 mg total) by mouth every 12 (twelve) hours. 657 capsule 3  . folic acid (FOLVITE) 1 MG tablet Take 1 mg by mouth every morning.     . Glucosamine-Chondroit-Vit C-Mn (GLUCOSAMINE CHONDR 1500 COMPLX) CAPS Take 1 capsule by mouth 2 (two) times daily.     . hydrocortisone 2.5 % cream Apply topically 2 (two) times daily. 30 g 0  . Multiple Vitamin (MULTIVITAMIN WITH MINERALS) TABS Take 1 tablet by mouth every morning.     . tamsulosin (FLOMAX) 0.4 MG CAPS capsule Take 0.4 mg by mouth.    . zolpidem  (AMBIEN) 10 MG tablet Take 0.5-1 tablets (5-10 mg total) by mouth at bedtime as needed. Can use half tab if sufficient. 90 tablet 1   No current facility-administered medications on file prior to visit.     Allergies  Allergen Reactions  . Amoxicillin Hives  . Vancomycin Rash    Severe redness all over body with swollen ankles and hands  . Tramadol Itching    2019 Shoulder post-op  . Oxycodone Rash  . Penicillins Itching and Rash    Past Medical History:  Diagnosis Date  . Acute reactive arthritis ([place]) per pcp note from 03-30-2012   right pre-patella---  secondary contact dermitis with poison ivy exposure 03-26-2012  . Angio-edema   . Arthritis   . Benign neoplasm of colon   . BPH (benign prostatic hypertrophy)    hx small stream  . 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 Insomnia, unspecified   . Oligospermia   . OSA on CPAP    PT LOST  WEIGHT - NO LONGER USING CPAP  . Pain    RIGHT SHOULDER DELTOID MUSCLE ATROPHY - LOSS OF STRENGT VERY LIMITED RANGE OF MOTTION  . Right knee skin infection   . Swelling of joint of right knee   . Urticaria     Past Surgical History:  Procedure Laterality Date  . ADENOIDECTOMY    . EXCISIONAL TOTAL KNEE ARTHROPLASTY WITH ANTIBIOTIC SPACERS Right  04/27/2012   Procedure: EXCISIONAL TOTAL KNEE ARTHROPLASTY WITH ANTIBIOTIC SPACERS;  Surgeon: Mauri Pole, MD;  Location: [facility];  Service: Orthopedics;  Laterality: Right;  SAME DAY LABS  . IRRIGATION AND DEBRIDEMENT KNEE Right 04/02/2012   Procedure: IRRIGATION AND DEBRIDEMENT RIGHT PRE-PATELLA  ;  Surgeon: Mauri Pole, MD;  Location: [facility];  Service: Orthopedics;  Laterality: Right;  . KNEE ARTHROSCOPY Right aug 2012  . SINOSCOPY    . TONSILLECTOMY    . TOTAL KNEE ARTHROPLASTY  01/28/2012   Procedure: TOTAL KNEE ARTHROPLASTY;  Surgeon: Mauri Pole, MD;  Location: [facility];  Service: Orthopedics;  Laterality: Right;  . TOTAL KNEE ARTHROPLASTY Left 07/17/2015   Procedure:  LEFT TOTAL KNEE ARTHROPLASTY;  Surgeon: Paralee Cancel, MD;  Location: [facility];  Service: Orthopedics;  Laterality: Left;  . TOTAL KNEE REVISION Right 06/15/2012   Procedure: reimplantation of right total knee ;  Surgeon: Mauri Pole, MD;  Location: [facility];  Service: Orthopedics;  Laterality: Right;  . ULNAR NERVE REPAIR Right 2008    Family History  Problem Relation Age of Onset  . Alzheimer's disease Mother   . Arthritis Father   . Diabetes Brother   . Hyperlipidemia Unknown     Social History   Socioeconomic History  . Marital status: Married    Spouse name: Not on file  . Number of children: Not on file  . Years of education: 56  . Highest education level: Not on file  Occupational History  . Occupation: Freight forwarder    Comment: Nature conservation officer  . Occupation: Best boy: [employer]  Social Needs  . Financial resource strain: Not on file  . Food insecurity:    Worry: Not on file    Inability: Not on file  . Transportation needs:    Medical: Not on file    Non-medical: Not on file  Tobacco Use  . Smoking status: Former Smoker    Types: Cigars    Last attempt to quit: 1990    Years since quitting: 30.0  . Smokeless tobacco: Never Used  Substance and Sexual Activity  . Alcohol use: Yes    Comment: occasionally  . Drug use: No  . Sexual activity: Not on file  Lifestyle  . Physical activity:    Days per week: Not on file    Minutes per session: Not on file  . Stress: Not on file  Relationships  . Social connections:    Talks on phone: Not on file    Gets together: Not on file    Attends religious service: Not on file    Active member of club or organization: Not on file    Attends meetings of clubs or organizations: Not on file    Relationship status: Not on file  . Intimate partner violence:    Fear of current or ex partner: Not on file    Emotionally abused: Not on file    Physically abused: Not on file    Forced sexual activity: Not on file   Other Topics Concern  . Not on file  Social History Narrative   Caffeine 4 cups avg daily, 4 cans soda.   The PMH, PSH, Social History, Family History, Medications, and allergies have been reviewed in [facility], and have been updated if relevant.   Review of Systems  Constitutional: Negative.   Respiratory: Negative.   Cardiovascular: Negative.   Gastrointestinal: Negative.   Endocrine: Negative.  Genitourinary: Negative.   Musculoskeletal: Negative.   Skin: Positive for color change, rash and wound.  Allergic/Immunologic: Negative for environmental allergies.  Neurological: Negative.   Psychiatric/Behavioral: Negative.   All other systems reviewed and are negative.      Objective:    BP (!) 150/90 (BP Location: Right Arm, Patient Position: Sitting, Cuff Size: Large)   Pulse 83   Temp 98.5 F (36.9 C) (Oral)   Resp 16   Ht 6\' 2"  (1.88 m)   Wt 246 lb (111.6 kg)   SpO2 94%   BMI 31.58 kg/m    Physical Exam Vitals signs and nursing note reviewed.  Constitutional:      Appearance: Normal appearance.  HENT:     Head: Normocephalic and atraumatic.     Nose: Nose normal.  Eyes:     Extraocular Movements: Extraocular movements intact.  Cardiovascular:     Rate and Rhythm: Normal rate.  Pulmonary:     Effort: Pulmonary effort is normal.  Musculoskeletal:        General: Swelling and deformity present.     Comments: Very large subcutaneous firm mass around prosthetic   Skin:    General: Skin is warm.     Findings: Erythema present.  Neurological:     General: No focal deficit present.     Mental Status: He is alert and oriented to person, place, and time.  Psychiatric:        Mood and Affect: Mood normal.        Behavior: Behavior normal.        Thought Content: Thought content normal.        Judgment: Judgment normal.           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

## 2018-01-03 NOTE — Assessment & Plan Note (Addendum)
  Very complicated patient.  He does not feel acutely sick but I explained to him and his wife that joint infections can go bad very quickly. Very large subcutaneous firm mass around prosthetic joint now with what appears to be ? Abscess which is quite firm above it. Per pt, this has been evaluated by ortho and derm but his wife felt it looks worse the past few days.  I advised STOPPING topical steroid cream. Will start Bactrim DS- twice daily but if no improvement within 2 doses, I do want him to call me back and will send him to the ED for imaging, IV abx. The patient indicates understanding of these issues and agrees with the plan.

## 2018-01-08 MED ORDER — HYDROXYZINE HCL 25 MG PO TABS
25.00 | ORAL_TABLET | ORAL | Status: DC
Start: ? — End: 2018-01-08

## 2018-01-08 MED ORDER — FAMOTIDINE 20 MG PO TABS
20.00 | ORAL_TABLET | ORAL | Status: DC
Start: 2018-01-09 — End: 2018-01-08

## 2018-01-08 MED ORDER — CALCIUM CARBONATE ANTACID 750 MG PO CHEW
1.00 | CHEWABLE_TABLET | ORAL | Status: DC
Start: ? — End: 2018-01-08

## 2018-01-08 MED ORDER — LACTATED RINGERS IV SOLN
125.00 | INTRAVENOUS | Status: DC
Start: ? — End: 2018-01-08

## 2018-01-08 MED ORDER — NALOXONE HCL 0.4 MG/ML IJ SOLN
.20 | INTRAMUSCULAR | Status: DC
Start: ? — End: 2018-01-08

## 2018-01-08 MED ORDER — GENERIC EXTERNAL MEDICATION
1.00 | Status: DC
Start: ? — End: 2018-01-08

## 2018-01-08 MED ORDER — GENERIC EXTERNAL MEDICATION
2.00 | Status: DC
Start: 2018-01-09 — End: 2018-01-08

## 2018-01-08 MED ORDER — CELECOXIB 100 MG PO CAPS
100.00 | ORAL_CAPSULE | ORAL | Status: DC
Start: 2018-01-09 — End: 2018-01-08

## 2018-01-08 MED ORDER — SODIUM CHLORIDE FLUSH 0.9 % IV SOLN
10.00 | INTRAVENOUS | Status: DC
Start: 2018-01-08 — End: 2018-01-08

## 2018-01-08 MED ORDER — ZOLPIDEM TARTRATE 10 MG PO TABS
10.00 | ORAL_TABLET | ORAL | Status: DC
Start: ? — End: 2018-01-08

## 2018-01-08 MED ORDER — ASPIRIN EC 81 MG PO TBEC
81.00 | DELAYED_RELEASE_TABLET | ORAL | Status: DC
Start: 2018-01-08 — End: 2018-01-08

## 2018-01-08 MED ORDER — GENERIC EXTERNAL MEDICATION
15.00 | Status: DC
Start: ? — End: 2018-01-08

## 2018-01-08 MED ORDER — ALUMINUM-MAGNESIUM-SIMETHICONE 200-200-20 MG/5ML PO SUSP
15.00 | ORAL | Status: DC
Start: ? — End: 2018-01-08

## 2018-01-08 MED ORDER — ONDANSETRON HCL 4 MG/2ML IJ SOLN
4.00 | INTRAMUSCULAR | Status: DC
Start: ? — End: 2018-01-08

## 2018-01-08 MED ORDER — MAGNESIUM CITRATE PO SOLN
296.00 | ORAL | Status: DC
Start: ? — End: 2018-01-08

## 2018-01-08 MED ORDER — TAMSULOSIN HCL 0.4 MG PO CAPS
0.40 | ORAL_CAPSULE | ORAL | Status: DC
Start: 2018-01-09 — End: 2018-01-08

## 2018-01-08 MED ORDER — SODIUM CHLORIDE 0.9 % IV SOLN
100.00 | INTRAVENOUS | Status: DC
Start: ? — End: 2018-01-08

## 2018-01-08 MED ORDER — GENERIC EXTERNAL MEDICATION
100.00 | Status: DC
Start: ? — End: 2018-01-08

## 2018-01-08 MED ORDER — SODIUM CHLORIDE FLUSH 0.9 % IV SOLN
10.00 | INTRAVENOUS | Status: DC
Start: ? — End: 2018-01-08

## 2018-01-08 MED ORDER — HYDROCODONE-ACETAMINOPHEN 10-325 MG PO TABS
1.00 | ORAL_TABLET | ORAL | Status: DC
Start: ? — End: 2018-01-08

## 2018-01-08 MED ORDER — METOCLOPRAMIDE HCL 10 MG PO TABS
10.00 | ORAL_TABLET | ORAL | Status: DC
Start: ? — End: 2018-01-08

## 2018-01-08 MED ORDER — DIPHENHYDRAMINE HCL 25 MG PO TABS
25.00 | ORAL_TABLET | ORAL | Status: DC
Start: ? — End: 2018-01-08

## 2018-01-08 MED ORDER — ACETAMINOPHEN 500 MG PO TABS
1000.00 | ORAL_TABLET | ORAL | Status: DC
Start: ? — End: 2018-01-08

## 2018-01-13 ENCOUNTER — Telehealth: Payer: Self-pay

## 2018-01-13 ENCOUNTER — Ambulatory Visit: Payer: 59 | Admitting: Internal Medicine

## 2018-01-13 ENCOUNTER — Encounter: Payer: Self-pay | Admitting: Internal Medicine

## 2018-01-13 DIAGNOSIS — T8459XD Infection and inflammatory reaction due to other internal joint prosthesis, subsequent encounter: Secondary | ICD-10-CM

## 2018-01-13 DIAGNOSIS — Z96619 Presence of unspecified artificial shoulder joint: Secondary | ICD-10-CM

## 2018-01-13 DIAGNOSIS — Z96611 Presence of right artificial shoulder joint: Secondary | ICD-10-CM | POA: Diagnosis not present

## 2018-01-13 DIAGNOSIS — T8459XA Infection and inflammatory reaction due to other internal joint prosthesis, initial encounter: Secondary | ICD-10-CM | POA: Insufficient documentation

## 2018-01-13 NOTE — Telephone Encounter (Signed)
Per Dr. Deatra James patient's home nurse that is taking care of he [facility] line. Spoke to [place] and is aware of the additional 28 days added on to treatment, also had labs faxed to [place]. Fax to notify pharmacy, [facility] to be followed.   Lenore Cordia, [name]

## 2018-01-13 NOTE — Telephone Encounter (Signed)
Faxed order to [facility] and [facility]

## 2018-01-13 NOTE — Progress Notes (Signed)
[facility] Infectious [facility]      Reason for Consult: prosthetic joint infection, shoulder    Referring Physician: Dr. [name]    Patient ID: Chris Obrien, male    DOB: 1950/05/25, 68 y.o.   MRN: [id number]  HPI:   He recently underwent right shoulder arthroplasty in November by Dr. [name] in [facility] and subsequently had his left shoulder replaced in December.  On his right shoulder, he began to develop some redness around the incision area and was given an oral antibiotic by his primary doctor, but this persisted and developed a larger fluctuant area.  He was seen his orthopedic surgeon in [place] for follow-up of his left shoulder but noted his right shoulder with the significant fluctuance and erythema.  He was then taken the following day to the operating room for debridement of his right shoulder and underwent incision and drainage along with hardware removal and spacer placement.  Cultures in the system have remained negative however the patient reports that a culture yesterday was reported to be C acnes.  He was put on a two-week course of IV ceftriaxone 2 g daily empirically and has continued on that.  He has been getting that here with a local home health.  He otherwise feels it is been healing well.  His PICC line is in his left arm and working well.  He has had a bad reaction to vancomycin in the past but is tolerating ceftriaxone with no associated diarrhea or rashes.  Previous record reviewed record reviewed from [place] and the surgical report.  Summarized above along with patient history.  Past Medical History:  Diagnosis Date  . Acute reactive arthritis ([place]) per pcp note from 03-30-2012   right pre-patella---  secondary contact dermitis with poison ivy exposure 03-26-2012  . Angio-edema   . Arthritis   . Benign neoplasm of colon   . BPH (benign prostatic hypertrophy)    hx small stream  . 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  Insomnia, unspecified   . Oligospermia   . OSA on CPAP    PT LOST  WEIGHT - NO LONGER USING CPAP  . Pain    RIGHT SHOULDER DELTOID MUSCLE ATROPHY - LOSS OF STRENGT VERY LIMITED RANGE OF MOTTION  . Right knee skin infection   . Swelling of joint of right knee   . Urticaria     Prior to Admission medications   Medication Sig Start Date End Date Taking? Authorizing Provider  celecoxib (CELEBREX) 200 MG capsule Take 1 capsule (200 mg total) by mouth every 12 (twelve) hours. 08/08/17   Plotnikov, Evie Lacks, MD  folic acid (FOLVITE) 1 MG tablet Take 1 mg by mouth every morning.     [provider]  Glucosamine-Chondroit-Vit C-Mn (GLUCOSAMINE CHONDR 1500 COMPLX) CAPS Take 1 capsule by mouth 2 (two) times daily.     [provider]  hydrocortisone 2.5 % cream Apply topically 2 (two) times daily. 06/11/16   Golden Circle, FNP  Multiple Vitamin (MULTIVITAMIN WITH MINERALS) TABS Take 1 tablet by mouth every morning.     [provider]  sulfamethoxazole-trimethoprim (BACTRIM DS,SEPTRA DS) 800-160 MG tablet Take 1 tablet by mouth 2 (two) times daily. 01/03/18   Lucille Passy, MD  tamsulosin (FLOMAX) 0.4 MG CAPS capsule Take 0.4 mg by mouth.    [provider]  zolpidem (AMBIEN) 10 MG tablet Take 0.5-1 tablets (5-10 mg total) by mouth at bedtime as needed. Can use half  tab if sufficient. 08/08/17   Plotnikov, Evie Lacks, MD    Allergies  Allergen Reactions  . Amoxicillin Hives  . Vancomycin Rash    Severe redness all over body with swollen ankles and hands  . Tramadol Itching    2019 Shoulder post-op  . Oxycodone Rash  . Penicillins Itching and Rash    Social History   Tobacco Use  . Smoking status: Former Smoker    Types: Cigars    Last attempt to quit: 1990    Years since quitting: 30.0  . Smokeless tobacco: Never Used  Substance Use Topics  . Alcohol use: Yes    Comment: occasionally  . Drug use: No    Family History  Problem Relation Age of  Onset  . Alzheimer's disease Mother   . Arthritis Father   . Diabetes Brother   . Hyperlipidemia Unknown     Review of Systems  Constitutional: negative for fevers and chills Gastrointestinal: negative for nausea and diarrhea Integument/breast: negative for rash All other systems reviewed and are negative    Constitutional: in no apparent distress  Vitals:   01/13/18 1004  BP: 137/89  Pulse: 88  Temp: 98.2 F (36.8 C)   EYES: anicteric ENMT: no thrush Cardiovascular: Cor RRR Respiratory: CTA B; normal respiratory effort GI: soft Musculoskeletal: right shoulder without warmth, no edema Skin: no rashes Neuro: non-focal  Labs: Lab Results  Component Value Date   WBC 6.2 10/15/2017   HGB 14.1 10/15/2017   HCT 42.4 10/15/2017   MCV 86.8 10/15/2017   PLT 321.0 10/15/2017    Lab Results  Component Value Date   CREATININE 1.01 10/15/2017   BUN 27 (H) 10/15/2017   NA 139 10/15/2017   K 4.5 10/15/2017   CL 105 10/15/2017   CO2 27 10/15/2017    Lab Results  Component Value Date   ALT 13 10/15/2017   AST 11 10/15/2017   ALKPHOS 75 10/15/2017   BILITOT 0.5 10/15/2017   INR 1.09 06/03/2012     Assessment: PJI of shoulder with C acnes.   Plan: 1) treat with ceftriaxone for 4-6 weeks, now through 1 week 2) labs per home health protocol; copies to me Follow up in 3 weeks, consider stopping then and observe off of antibiotics

## 2018-02-10 ENCOUNTER — Ambulatory Visit (INDEPENDENT_AMBULATORY_CARE_PROVIDER_SITE_OTHER): Payer: 59 | Admitting: Internal Medicine

## 2018-02-10 ENCOUNTER — Encounter: Payer: Self-pay | Admitting: Internal Medicine

## 2018-02-10 ENCOUNTER — Telehealth: Payer: Self-pay

## 2018-02-10 VITALS — BP 148/79 | HR 83 | Temp 98.1°F | Wt 249.0 lb

## 2018-02-10 DIAGNOSIS — T8459XD Infection and inflammatory reaction due to other internal joint prosthesis, subsequent encounter: Secondary | ICD-10-CM

## 2018-02-10 DIAGNOSIS — Z5181 Encounter for therapeutic drug level monitoring: Secondary | ICD-10-CM | POA: Diagnosis not present

## 2018-02-10 DIAGNOSIS — Z95828 Presence of other vascular implants and grafts: Secondary | ICD-10-CM

## 2018-02-10 DIAGNOSIS — Z452 Encounter for adjustment and management of vascular access device: Secondary | ICD-10-CM | POA: Insufficient documentation

## 2018-02-10 DIAGNOSIS — Z96619 Presence of unspecified artificial shoulder joint: Secondary | ICD-10-CM

## 2018-02-10 MED ORDER — DOXYCYCLINE HYCLATE 100 MG PO CAPS: 100.0000 mg | ORAL_CAPSULE | Freq: Two times a day (BID) | ORAL | 2 refills | Status: DC

## 2018-02-10 NOTE — Progress Notes (Signed)
   Subjective:    Patient ID: Chris Obrien, male    DOB: 04-05-1950, 68 y.o.   MRN: [id number]  HPI Here for follow up of PJI of shoulder, s/p hardware removal. Culture did grow C acnes and has been on ceftriaxone, now 4 weeks.  Plan for reimplantation after treatment completion.  Inflammatory markers now wnl, feels like it is improving.  Some issues with infusion with sharp pain in arm, small rash, but resolves.  No associated diarrhea.    Review of Systems  Constitutional: Negative for fatigue and fever.  Gastrointestinal: Negative for nausea.       Objective:   Physical Exam Constitutional:      Appearance: Normal appearance.  Eyes:     General: No scleral icterus. Cardiovascular:     Rate and Rhythm: Regular rhythm.  Pulmonary:     Effort: Pulmonary effort is normal.     Breath sounds: Normal breath sounds.  Skin:    Findings: No rash.  Neurological:     Mental Status: He is alert.   SH: no current tobacco        Assessment & Plan:

## 2018-02-10 NOTE — Assessment & Plan Note (Signed)
No issues on labs, normal creat, LFTs

## 2018-02-10 NOTE — Assessment & Plan Note (Addendum)
Doing well on current treatment.  Will transition to oral therapy with doxycycline for a projected 2 more months to complete over 3 months.  Discussed photosensitivity rash, sitting up OK from ID standpoint after that for reimplantation.   He will follow up with me in 6 weeks

## 2018-02-10 NOTE — Telephone Encounter (Signed)
Called [facility] per Dr. [name] to d/c pt IV antbiotics and pull picc. Spoke with Kasandra Knudsen, Pharmacist who was able to take a verbal order. Home health will be notified by [facility] with order. [facility]: [phone].  [place]

## 2018-02-10 NOTE — Telephone Encounter (Signed)
Per Dr. [name] called [facility] to stop pt IV antibiotics and pull picc. Spoke with Jackelyn Poling at [facility] who was able to take a verbal order. Home Health will pull picc line out on 2/5 Chris Obrien, [name]

## 2018-02-10 NOTE — Assessment & Plan Note (Signed)
Will have this removed by [facility]

## 2018-03-23 ENCOUNTER — Ambulatory Visit: Payer: 59 | Admitting: Family

## 2018-03-24 ENCOUNTER — Ambulatory Visit: Payer: 59 | Admitting: Internal Medicine

## 2018-03-25 MED ORDER — LACTATED RINGERS IV SOLN
125.00 | INTRAVENOUS | Status: DC
Start: ? — End: 2018-03-25

## 2018-03-25 MED ORDER — TAMSULOSIN HCL 0.4 MG PO CAPS
0.40 | ORAL_CAPSULE | ORAL | Status: DC
Start: 2018-03-26 — End: 2018-03-25

## 2018-03-25 MED ORDER — TEMAZEPAM 15 MG PO CAPS
15.00 | ORAL_CAPSULE | ORAL | Status: DC
Start: ? — End: 2018-03-25

## 2018-03-25 MED ORDER — ASPIRIN EC 81 MG PO TBEC
81.00 | DELAYED_RELEASE_TABLET | ORAL | Status: DC
Start: 2018-03-25 — End: 2018-03-25

## 2018-03-25 MED ORDER — GENERIC EXTERNAL MEDICATION
296.00 | Status: DC
Start: ? — End: 2018-03-25

## 2018-03-25 MED ORDER — ONDANSETRON HCL 4 MG/2ML IJ SOLN
4.00 | INTRAMUSCULAR | Status: DC
Start: ? — End: 2018-03-25

## 2018-03-25 MED ORDER — GENERIC EXTERNAL MEDICATION
100.00 | Status: DC
Start: ? — End: 2018-03-25

## 2018-03-25 MED ORDER — SODIUM CHLORIDE 0.9 % IV SOLN
75.00 | INTRAVENOUS | Status: DC
Start: ? — End: 2018-03-25

## 2018-03-25 MED ORDER — HYDROXYZINE HCL 25 MG PO TABS
25.00 | ORAL_TABLET | ORAL | Status: DC
Start: ? — End: 2018-03-25

## 2018-03-25 MED ORDER — CALCIUM CARBONATE ANTACID 750 MG PO CHEW
1.00 | CHEWABLE_TABLET | ORAL | Status: DC
Start: ? — End: 2018-03-25

## 2018-03-25 MED ORDER — GENERIC EXTERNAL MEDICATION
1.00 | Status: DC
Start: 2018-03-26 — End: 2018-03-25

## 2018-03-25 MED ORDER — NALOXONE HCL 0.4 MG/ML IJ SOLN
0.20 | INTRAMUSCULAR | Status: DC
Start: ? — End: 2018-03-25

## 2018-03-25 MED ORDER — PROMETHAZINE HCL 25 MG/ML IJ SOLN
25.00 | INTRAMUSCULAR | Status: DC
Start: ? — End: 2018-03-25

## 2018-03-25 MED ORDER — ACETAMINOPHEN 500 MG PO TABS
1000.00 | ORAL_TABLET | ORAL | Status: DC
Start: 2018-03-25 — End: 2018-03-25

## 2018-03-25 MED ORDER — ZOLPIDEM TARTRATE 10 MG PO TABS
10.00 | ORAL_TABLET | ORAL | Status: DC
Start: ? — End: 2018-03-25

## 2018-03-25 MED ORDER — GENERIC EXTERNAL MEDICATION
15.00 | Status: DC
Start: ? — End: 2018-03-25

## 2018-03-25 MED ORDER — ALUMINUM-MAGNESIUM-SIMETHICONE 200-200-20 MG/5ML PO SUSP
15.00 | ORAL | Status: DC
Start: ? — End: 2018-03-25

## 2018-03-25 MED ORDER — DIPHENHYDRAMINE HCL 25 MG PO TABS
25.00 | ORAL_TABLET | ORAL | Status: DC
Start: ? — End: 2018-03-25

## 2018-03-26 ENCOUNTER — Other Ambulatory Visit: Payer: Self-pay | Admitting: Internal Medicine

## 2018-03-26 DIAGNOSIS — G4730 Sleep apnea, unspecified: Secondary | ICD-10-CM

## 2018-03-26 DIAGNOSIS — G4700 Insomnia, unspecified: Secondary | ICD-10-CM

## 2018-03-26 DIAGNOSIS — G4731 Primary central sleep apnea: Secondary | ICD-10-CM

## 2018-03-26 NOTE — Telephone Encounter (Signed)
Copied from [place] [phone]. Topic: Quick Communication - Rx Refill/Question >> Mar 26, 2018  1:13 PM Scherrie Gerlach wrote: Medication: zolpidem (AMBIEN) 10 MG tablet  Pt states he has just had shoulder surgery and needs to sleep!! Pt had refill but it was expired.  [place], [place] AT [place]

## 2018-03-27 ENCOUNTER — Other Ambulatory Visit: Payer: Self-pay | Admitting: Internal Medicine

## 2018-03-27 DIAGNOSIS — G4700 Insomnia, unspecified: Secondary | ICD-10-CM

## 2018-03-27 DIAGNOSIS — G4730 Sleep apnea, unspecified: Secondary | ICD-10-CM

## 2018-03-27 DIAGNOSIS — G4731 Primary central sleep apnea: Secondary | ICD-10-CM

## 2018-03-30 MED ORDER — ZOLPIDEM TARTRATE 10 MG PO TABS: 5.0000 mg | ORAL_TABLET | Freq: Every evening | ORAL | 1 refills | Status: DC | PRN

## 2018-03-30 NOTE — Telephone Encounter (Signed)
Pt checking status on Ambien being sent in.

## 2018-07-22 ENCOUNTER — Other Ambulatory Visit: Payer: Self-pay | Admitting: Physician Assistant

## 2018-07-22 DIAGNOSIS — Z471 Aftercare following joint replacement surgery: Secondary | ICD-10-CM

## 2018-07-22 DIAGNOSIS — Z96612 Presence of left artificial shoulder joint: Secondary | ICD-10-CM

## 2018-07-22 DIAGNOSIS — M25512 Pain in left shoulder: Secondary | ICD-10-CM

## 2018-07-23 DIAGNOSIS — T849XXD Unspecified complication of internal orthopedic prosthetic device, implant and graft, subsequent encounter: Secondary | ICD-10-CM | POA: Insufficient documentation

## 2018-07-28 DIAGNOSIS — Z96612 Presence of left artificial shoulder joint: Secondary | ICD-10-CM | POA: Insufficient documentation

## 2018-07-29 MED ORDER — GENERIC EXTERNAL MEDICATION
Status: DC
Start: ? — End: 2018-07-29

## 2018-07-29 MED ORDER — MORPHINE SULFATE 2 MG/ML IJ SOLN
2.00 | INTRAMUSCULAR | Status: DC
Start: ? — End: 2018-07-29

## 2018-07-29 MED ORDER — ZOLPIDEM TARTRATE 5 MG PO TABS
10.00 | ORAL_TABLET | ORAL | Status: DC
Start: ? — End: 2018-07-29

## 2018-07-29 MED ORDER — ONDANSETRON 4 MG PO TBDP
4.00 | ORAL_TABLET | ORAL | Status: DC
Start: ? — End: 2018-07-29

## 2018-07-29 MED ORDER — NALOXONE HCL 0.4 MG/ML IJ SOLN
.20 | INTRAMUSCULAR | Status: DC
Start: ? — End: 2018-07-29

## 2018-07-29 MED ORDER — CELECOXIB 200 MG PO CAPS
200.00 | ORAL_CAPSULE | ORAL | Status: DC
Start: 2018-07-29 — End: 2018-07-29

## 2018-07-29 MED ORDER — TRAMADOL HCL 50 MG PO TABS
50.00 | ORAL_TABLET | ORAL | Status: DC
Start: ? — End: 2018-07-29

## 2018-07-29 MED ORDER — PROMETHAZINE HCL 25 MG/ML IJ SOLN
25.00 | INTRAMUSCULAR | Status: DC
Start: ? — End: 2018-07-29

## 2018-07-29 MED ORDER — DIPHENHYDRAMINE HCL 25 MG PO TABS
25.00 | ORAL_TABLET | ORAL | Status: DC
Start: ? — End: 2018-07-29

## 2018-07-29 MED ORDER — TAMSULOSIN HCL 0.4 MG PO CAPS
0.40 | ORAL_CAPSULE | ORAL | Status: DC
Start: 2018-07-30 — End: 2018-07-29

## 2018-07-29 MED ORDER — ASPIRIN EC 81 MG PO TBEC
81.00 | DELAYED_RELEASE_TABLET | ORAL | Status: DC
Start: 2018-07-29 — End: 2018-07-29

## 2018-07-29 MED ORDER — HYDROCODONE-ACETAMINOPHEN 10-325 MG PO TABS
1.00 | ORAL_TABLET | ORAL | Status: DC
Start: ? — End: 2018-07-29

## 2018-07-29 MED ORDER — GENERIC EXTERNAL MEDICATION
296.00 | Status: DC
Start: ? — End: 2018-07-29

## 2018-07-29 MED ORDER — ALUMINUM-MAGNESIUM-SIMETHICONE 200-200-20 MG/5ML PO SUSP
15.00 | ORAL | Status: DC
Start: ? — End: 2018-07-29

## 2018-07-29 MED ORDER — HYDROXYZINE HCL 25 MG PO TABS
25.00 | ORAL_TABLET | ORAL | Status: DC
Start: ? — End: 2018-07-29

## 2018-07-29 MED ORDER — SODIUM CHLORIDE 0.9 % IV SOLN
100.00 | INTRAVENOUS | Status: DC
Start: ? — End: 2018-07-29

## 2018-07-29 MED ORDER — CALCIUM CARBONATE ANTACID 750 MG PO CHEW
1.00 | CHEWABLE_TABLET | ORAL | Status: DC
Start: ? — End: 2018-07-29

## 2018-08-31 ENCOUNTER — Telehealth: Payer: Self-pay | Admitting: Internal Medicine

## 2018-08-31 ENCOUNTER — Telehealth: Payer: Self-pay

## 2018-08-31 NOTE — Telephone Encounter (Signed)
Attempted to call patient to schedule appointment regarding mychart message. Unable to reach patient at this time; left voicemail requesting patient call office back.  [place]

## 2018-08-31 NOTE — Telephone Encounter (Signed)
COVID-19 Pre-Screening Questions: ° °Do you currently have a fever (>100 °F), chills or unexplained body aches? N ° °Are you currently experiencing new cough, shortness of breath, sore throat, runny nose? N °•  °Have you recently travelled outside the state of [place] in the last 14 days? N  °•  °Have you been in contact with someone that is currently pending confirmation of Covid19 testing or has been confirmed to have the Covid19 virus?  N ° °**If the patient answers NO to ALL questions -  advise the patient to please call the clinic before coming to the office should any symptoms develop.  ° ° ° °

## 2018-08-31 NOTE — Telephone Encounter (Signed)
Called pt concerning mychart msg there was no answer LMOM RTC for appt.Marland KitchenJohny Obrien

## 2018-08-31 NOTE — Telephone Encounter (Signed)
Patient returning call regarding appointment. Patient was able to agree to come in on 8/25 to see Dr. [name] regarding left shoulder. Patient would like to have labs done on that day incase his left should is infected.  Chris Obrien

## 2018-09-01 ENCOUNTER — Encounter: Payer: Self-pay | Admitting: Internal Medicine

## 2018-09-01 ENCOUNTER — Other Ambulatory Visit: Payer: Self-pay

## 2018-09-01 ENCOUNTER — Ambulatory Visit (INDEPENDENT_AMBULATORY_CARE_PROVIDER_SITE_OTHER): Payer: 59 | Admitting: Internal Medicine

## 2018-09-01 DIAGNOSIS — L0390 Cellulitis, unspecified: Secondary | ICD-10-CM | POA: Insufficient documentation

## 2018-09-01 DIAGNOSIS — L03114 Cellulitis of left upper limb: Secondary | ICD-10-CM | POA: Diagnosis not present

## 2018-09-01 MED ORDER — CEPHALEXIN 500 MG PO CAPS: 500.0000 mg | ORAL_CAPSULE | Freq: Four times a day (QID) | ORAL | 0 refills | Status: DC

## 2018-09-01 NOTE — Progress Notes (Signed)
[facility] for Infectious Disease      Reason for Consult: cellulitis    Referring Physician: Dr. [name]    Patient ID: Chris Obrien, male    DOB: 01/29/1950, 68 y.o.   MRN: [id number]  HPI:   Here for a new problem of left should erythema.  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 Acnes with reverse total shoulder arthroplasty and had completed 4 weeks of IV ceftriaxone.  He had his right shoulder reimplanted and is doing well.  His new problem is his left shoulder.  On 07/28/18 he underwent revision of the left total shoulder arthroplasty with conversion to reverse and had been on doxycycline post op.  His ROM is doing well, no significant pain and overall doing well post op.  His problem is the development of redness at the incision site.  He has had redness since surgery and more recently with 3 blistering areas over the incision.  No pus noted and the blisters have not been unroofed.  No pain in the area over the incision or joint.  No associated fever or chills.   Previous record reviewed from Dr. [name] and operative procedures done.    Past Medical History:  Diagnosis Date  . Acute reactive arthritis ([place]) per pcp note from 03-30-2012   right pre-patella---  secondary contact dermitis with poison ivy exposure 03-26-2012  . Angio-edema   . Arthritis   . Benign neoplasm of colon   . BPH (benign prostatic hypertrophy)    hx small stream  . 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 Insomnia, unspecified   . Oligospermia   . OSA on CPAP    PT LOST  WEIGHT - NO LONGER USING CPAP  . Pain    RIGHT SHOULDER DELTOID MUSCLE ATROPHY - LOSS OF STRENGT VERY LIMITED RANGE OF MOTTION  . Right knee skin infection   . Swelling of joint of right knee   . Urticaria     Prior to Admission medications   Medication Sig Start Date End Date Taking? Authorizing Provider  celecoxib (CELEBREX) 200 MG capsule Take 1  capsule (200 mg total) by mouth every 12 (twelve) hours. 08/08/17  Yes Plotnikov, Evie Lacks, MD  folic acid (FOLVITE) 1 MG tablet Take 1 mg by mouth every morning.    Yes [provider]  Glucosamine-Chondroit-Vit C-Mn (GLUCOSAMINE CHONDR 1500 COMPLX) CAPS Take 1 capsule by mouth 2 (two) times daily.    Yes [provider]  hydrocortisone 2.5 % cream Apply topically 2 (two) times daily. 06/11/16  Yes Golden Circle, FNP  Multiple Vitamin (MULTIVITAMIN WITH MINERALS) TABS Take 1 tablet by mouth every morning.    Yes [provider]  tamsulosin (FLOMAX) 0.4 MG CAPS capsule Take 0.4 mg by mouth.   Yes [provider]  zolpidem (AMBIEN) 10 MG tablet Take 0.5-1 tablets (5-10 mg total) by mouth at bedtime as needed. Can use half tab if sufficient. 03/30/18  Yes Plotnikov, Evie Lacks, MD  cephALEXin (KEFLEX) 500 MG capsule Take 1 capsule (500 mg total) by mouth 4 (four) times daily. 09/01/18   Comer, Okey Regal, MD    Allergies  Allergen Reactions  . Amoxicillin Hives  . Vancomycin Rash    Severe redness all over body with swollen ankles and hands  . Tramadol Itching    2019 Shoulder post-op  . Oxycodone Rash  . Penicillins Itching and Rash  Social History   Tobacco Use  . Smoking status: Former Smoker    Types: Cigars    Quit date: 1990    Years since quitting: 30.6  . Smokeless tobacco: Never Used  Substance Use Topics  . Alcohol use: Yes    Comment: occasionally  . Drug use: No    Family History  Problem Relation Age of Onset  . Alzheimer's disease Mother   . Arthritis Father   . Diabetes Brother   . Hyperlipidemia Unknown     Review of Systems  Constitutional: negative for fevers, chills and sweats Gastrointestinal: negative for nausea and diarrhea Integument/breast: negative for rash Musculoskeletal: negative for myalgias and arthralgias All other systems reviewed and are negative    Constitutional: in no apparent distress  Vitals:    09/01/18 0951  BP: (!) 163/93  Pulse: 98   EYES: anicteric Cardiovascular: Cor RRR Respiratory: CTA B; normal respiratory effort Musculoskeletal: left shoulder with good ROM; at the incision site, it is closed with three blistering areas.  No purulence noted in the blisters, more dark like blood.  There is erythema around the incision site but no warmth, no tenderness Skin: negatives: no rash Hematologic: no cervical lad Neuro: non focal Psych: normal affect  Labs: Lab Results  Component Value Date   WBC 6.2 10/15/2017   HGB 14.1 10/15/2017   HCT 42.4 10/15/2017   MCV 86.8 10/15/2017   PLT 321.0 10/15/2017    Lab Results  Component Value Date   CREATININE 1.01 10/15/2017   BUN 27 (H) 10/15/2017   NA 139 10/15/2017   K 4.5 10/15/2017   CL 105 10/15/2017   CO2 27 10/15/2017    Lab Results  Component Value Date   ALT 13 10/15/2017   AST 11 10/15/2017   ALKPHOS 75 10/15/2017   BILITOT 0.5 10/15/2017   INR 1.09 06/03/2012     Assessment: Possible cellulitis vs allergic reaction.  Clinically, not c/w cellulitis as it is not warm, not tender and does not appear to have pus in the blisters.  I opted not to unroof them with concern of skin integrity and not likely infectious.  There though should not be any foreign material to cause an allergic reaction with staples out.  Therefore, I am going to start him on Keflex and see if he has some improvement in the area.  If not, I would not consider this infectious and just needs supportive care.  I will have him return Monday and reexamine.   Also, he has an allergy to amoxicillin but did tolerate ceftriaxone so keflex should be ok  Plan: 1) keflex 500 mg 4 times a day Return in 6 days

## 2018-09-02 ENCOUNTER — Telehealth: Payer: Self-pay | Admitting: *Deleted

## 2018-09-02 ENCOUNTER — Ambulatory Visit: Payer: 59 | Admitting: *Deleted

## 2018-09-02 DIAGNOSIS — L03114 Cellulitis of left upper limb: Secondary | ICD-10-CM

## 2018-09-02 NOTE — Telephone Encounter (Signed)
Patient called office this morning as a follow up to sending mychart to Dr [name]. After speaking with Dr [name] advised patient to come to clinic to have wound swabbed and labs drawn. Patient asked if labs could be faxed to his referring provider once they come in.  Dr [name] at [phone]. Advised him will do.

## 2018-09-03 LAB — COMPLETE METABOLIC PANEL WITH GFR
AG Ratio: 2.4 (calc) (ref 1.0–2.5)
ALT: 16 U/L (ref 9–46)
AST: 14 U/L (ref 10–35)
Albumin: 4.1 g/dL (ref 3.6–5.1)
Alkaline phosphatase (APISO): 69 U/L (ref 35–144)
BUN: 20 mg/dL (ref 7–25)
CO2: 28 mmol/L (ref 20–32)
Calcium: 8.9 mg/dL (ref 8.6–10.3)
Chloride: 104 mmol/L (ref 98–110)
Creat: 1.08 mg/dL (ref 0.70–1.18)
GFR, Est African American: 80 mL/min/{1.73_m2} (ref >=60)
GFR, Est Non African American: 69 mL/min/{1.73_m2} (ref >=60)
Globulin: 1.7 g/dL (calc) — ABNORMAL LOW (ref 1.9–3.7)
Glucose, Bld: 105 mg/dL — ABNORMAL HIGH (ref 65–99)
Potassium: 4.8 mmol/L (ref 3.5–5.3)
Sodium: 138 mmol/L (ref 135–146)
Total Bilirubin: 0.7 mg/dL (ref 0.2–1.2)
Total Protein: 5.8 g/dL — ABNORMAL LOW (ref 6.1–8.1)

## 2018-09-03 LAB — CBC WITH DIFFERENTIAL/PLATELET
Absolute Monocytes: 622 cells/uL (ref 200–950)
Basophils Absolute: 18 cells/uL (ref 0–200)
Basophils Relative: 0.3 %
Eosinophils Absolute: 238 cells/uL (ref 15–500)
Eosinophils Relative: 3.9 %
HCT: 43.8 % (ref 38.5–50.0)
Hemoglobin: 14.4 g/dL (ref 13.2–17.1)
Lymphs Abs: 1312 cells/uL (ref 850–3900)
MCH: 28.6 pg (ref 27.0–33.0)
MCHC: 32.9 g/dL (ref 32.0–36.0)
MCV: 86.9 fL (ref 80.0–100.0)
MPV: 8.7 fL (ref 7.5–12.5)
Monocytes Relative: 10.2 %
Neutro Abs: 3910 cells/uL (ref 1500–7800)
Neutrophils Relative %: 64.1 %
Platelets: 323 10*3/uL (ref 140–400)
RBC: 5.04 10*6/uL (ref 4.20–5.80)
RDW: 12.7 % (ref 11.0–15.0)
Total Lymphocyte: 21.5 %
WBC: 6.1 10*3/uL (ref 3.8–10.8)

## 2018-09-03 LAB — SEDIMENTATION RATE: Sed Rate: 22 mm/h — ABNORMAL HIGH (ref 0–20)

## 2018-09-03 LAB — C-REACTIVE PROTEIN: CRP: 100.6 mg/L — ABNORMAL HIGH (ref <8.0)

## 2018-09-05 LAB — WOUND CULTURE
MICRO NUMBER:: 818275
RESULT:: NO GROWTH
SPECIMEN QUALITY:: ADEQUATE

## 2018-09-07 ENCOUNTER — Encounter: Payer: Self-pay | Admitting: Internal Medicine

## 2018-09-07 ENCOUNTER — Ambulatory Visit (INDEPENDENT_AMBULATORY_CARE_PROVIDER_SITE_OTHER): Payer: 59 | Admitting: Internal Medicine

## 2018-09-07 ENCOUNTER — Other Ambulatory Visit: Payer: Self-pay

## 2018-09-07 DIAGNOSIS — T8149XA Infection following a procedure, other surgical site, initial encounter: Secondary | ICD-10-CM | POA: Diagnosis not present

## 2018-09-07 DIAGNOSIS — M75101 Unspecified rotator cuff tear or rupture of right shoulder, not specified as traumatic: Secondary | ICD-10-CM | POA: Diagnosis not present

## 2018-09-07 DIAGNOSIS — M12811 Other specific arthropathies, not elsewhere classified, right shoulder: Secondary | ICD-10-CM

## 2018-09-07 MED ORDER — CEPHALEXIN 500 MG PO CAPS: 500.0000 mg | ORAL_CAPSULE | Freq: Four times a day (QID) | ORAL | 0 refills | Status: DC

## 2018-09-07 NOTE — Assessment & Plan Note (Addendum)
I asked him to continue to drain it until complete and continue to take the Keflex If he is unable to completely drain it, he can consider urgent care or to go back to his surgeon for superficial I and D

## 2018-09-07 NOTE — Progress Notes (Signed)
   Subjective:    Patient ID: Chris Obrien, male    DOB: Nov 15, 1950, 68 y.o.   MRN: [id number]  HPI Here for follow up of superficial shoulder infection. Since his initial visit, his blister area over closed incision burst and noted significant pus coming out underneath.  No fever, no chills.  Still is draining out of an area under blisters.  Shoulder with good movement and no concerns.  ESR 22 and CRP 100.6.  Wound culture sent and did not grow anything.    Review of Systems  Constitutional: Negative for fever.  Gastrointestinal: Negative for diarrhea and nausea.       Objective:   Physical Exam Eyes:     General: No scleral icterus. Musculoskeletal:     Comments: Left shoulder with some minimal fluctuance at inferior part of incision and draining pus; no warmth; erythema improved  Left shoulder with no joint warmth, normal ROM with no pain with movement.    Skin:    Findings: No rash.  Neurological:     Mental Status: He is alert.  Psychiatric:        Mood and Affect: Mood normal.           Assessment & Plan:

## 2018-09-07 NOTE — Assessment & Plan Note (Signed)
Shoulder on exam is good with no concerns regarding a joint infection.  CRP noted and is significantly up but ESR near normal and exam does not suggest any infection.  I will defer to his surgeon if aspiration indicated but no concerns today on my exam.

## 2018-09-10 ENCOUNTER — Other Ambulatory Visit: Payer: Self-pay

## 2018-09-10 ENCOUNTER — Encounter: Payer: Self-pay | Admitting: Internal Medicine

## 2018-09-10 ENCOUNTER — Ambulatory Visit (INDEPENDENT_AMBULATORY_CARE_PROVIDER_SITE_OTHER): Payer: 59 | Admitting: Internal Medicine

## 2018-09-10 ENCOUNTER — Telehealth: Payer: Self-pay | Admitting: *Deleted

## 2018-09-10 VITALS — BP 160/84 | HR 79 | Temp 99.2°F | Ht 74.0 in | Wt 244.0 lb

## 2018-09-10 VITALS — BP 169/96 | HR 76 | Temp 98.4°F

## 2018-09-10 DIAGNOSIS — Z23 Encounter for immunization: Secondary | ICD-10-CM | POA: Diagnosis not present

## 2018-09-10 DIAGNOSIS — L03114 Cellulitis of left upper limb: Secondary | ICD-10-CM

## 2018-09-10 DIAGNOSIS — Z881 Allergy status to other antibiotic agents status: Secondary | ICD-10-CM | POA: Diagnosis not present

## 2018-09-10 DIAGNOSIS — T8149XA Infection following a procedure, other surgical site, initial encounter: Secondary | ICD-10-CM

## 2018-09-10 DIAGNOSIS — L02414 Cutaneous abscess of left upper limb: Secondary | ICD-10-CM | POA: Diagnosis not present

## 2018-09-10 MED ORDER — CEFTRIAXONE SODIUM 1 G IJ SOLR
1.0000 g | Freq: Once | INTRAMUSCULAR | Status: AC
  Administered 2018-09-10: 1 g via INTRAMUSCULAR

## 2018-09-10 MED ORDER — CEPHALEXIN 500 MG PO CAPS: 500.0000 mg | ORAL_CAPSULE | Freq: Four times a day (QID) | ORAL | 0 refills | Status: DC

## 2018-09-10 MED ORDER — MUPIROCIN 2 % EX OINT: TOPICAL_OINTMENT | CUTANEOUS | 0 refills | Status: DC

## 2018-09-10 MED ORDER — ALIGN 4 MG PO CAPS: 1.0000 | ORAL_CAPSULE | Freq: Every day | ORAL | 1 refills | Status: DC

## 2018-09-10 MED ORDER — CEPHALEXIN 500 MG PO CAPS: 500.0000 mg | ORAL_CAPSULE | Freq: Four times a day (QID) | ORAL | 1 refills | Status: DC

## 2018-09-10 NOTE — Assessment & Plan Note (Signed)
I would prefer he have an I and D of the superficial skin to help drain.  He is going to discuss with his PCP about that.  I will give him a 2 week supply of Keflex to take while it drains.   No indication for prolonged oral antibiotics as prophylaxis with his multiple joint replacements.

## 2018-09-10 NOTE — Patient Instructions (Addendum)
Remove packing on Saturday Go to ER if problems

## 2018-09-10 NOTE — Telephone Encounter (Signed)
Ok to work in today per PCP after 4:00 pm. Pt informed to arrive at 4:15 as he will most likely have to wait. Pt agrees.

## 2018-09-10 NOTE — Assessment & Plan Note (Signed)
Doing well on Keflex

## 2018-09-10 NOTE — Progress Notes (Signed)
Subjective:  Patient ID: Chris Obrien, male    DOB: 09-03-1950  Age: 68 y.o. MRN: [id number]  CC:  Chris Obrien was worked in today after Dr. Audry Obrien recommended incision and drainage of the left shoulder scar swelling re-occurrence.  Chris Obrien presents for L shoulder swelling and redness.  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 William Obrien. About 1 week ago Chris Obrien noticed swelling and blistering of the area under the scar.  It ruptured eventually and Chris Obrien squeezed out a lot of pus.  He saw Dr. Linus Obrien and was prescribed Keflex p.o.  The swelling has relapsed.  There is no drainage now.  There is no fever, chills or night sweats.  Chris Obrien was worked in today after Dr. Audry Obrien recommended incision and drainage of the left shoulder scar swelling re-occurrence.  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 celecoxib (CELEBREX) 200 MG capsule Take 1 capsule (200 mg total) by mouth every 12 (twelve) hours. 180 capsule 3  . cephALEXin (KEFLEX) 500 MG capsule Take 1 capsule (500 mg total) by mouth 4 (four) times daily. 56 capsule 0  . folic acid (FOLVITE) 1 MG tablet Take 1 mg by mouth every morning.     . Glucosamine-Chondroit-Vit C-Mn (GLUCOSAMINE CHONDR 1500 COMPLX) CAPS Take 1 capsule by mouth 2 (two) times daily.     . hydrocortisone 2.5 % cream Apply topically 2 (two) times daily. 30 g 0  . Multiple Vitamin (MULTIVITAMIN WITH MINERALS) TABS Take 1 tablet by mouth every morning.     . tamsulosin (FLOMAX) 0.4 MG CAPS capsule Take 0.4 mg by mouth.    . zolpidem (AMBIEN) 10 MG tablet Take 0.5-1 tablets (5-10 mg total) by mouth at bedtime as needed. Can use half tab if sufficient. 90 tablet 1   No facility-administered medications prior to visit.     ROS: Review of Systems  Constitutional: Negative for appetite change, fatigue and unexpected  weight change.  HENT: Negative for congestion, nosebleeds, sneezing, sore throat and trouble swallowing.   Eyes: Negative for itching and visual disturbance.  Respiratory: Negative for cough.   Cardiovascular: Negative for chest pain, palpitations and leg swelling.  Gastrointestinal: Negative for abdominal distention, blood in stool, diarrhea and nausea.  Genitourinary: Negative for frequency and hematuria.  Musculoskeletal: Negative for back pain, gait problem, joint swelling and neck pain.  Skin: Positive for color change. Negative for rash.  Neurological: Negative for dizziness, tremors, speech difficulty and weakness.  Psychiatric/Behavioral: Negative for agitation, dysphoric mood and sleep disturbance. The patient is not nervous/anxious.     Objective:  BP (!) 160/84   Pulse 79   Temp 99.2 F (37.3 C) (Oral)   Ht 6\' 2"  (1.88 m)   Wt 244 lb (110.7 kg)   SpO2 95%   BMI 31.33 kg/m   BP Readings from Last 3 Encounters:  09/10/18 (!) 160/84  09/10/18 (!) 169/96  09/07/18 (!) 161/90    Wt Readings from Last 3 Encounters:  09/10/18 244 lb (110.7 kg)  02/10/18 249 lb (112.9 kg)  01/13/18 243 lb (110.2 kg)    Physical Exam Constitutional:      General: He is not in acute distress.    Appearance: He is well-developed.     Comments: NAD  Eyes:     Conjunctiva/sclera: Conjunctivae normal.  Pupils: Pupils are equal, round, and reactive to light.  Neck:     Musculoskeletal: Normal range of motion.     Thyroid: No thyromegaly.     Vascular: No JVD.  Cardiovascular:     Rate and Rhythm: Normal rate and regular rhythm.     Heart sounds: Normal heart sounds. No murmur. No friction rub. No gallop.   Pulmonary:     Effort: Pulmonary effort is normal. No respiratory distress.     Breath sounds: Normal breath sounds. No wheezing or rales.  Chest:     Chest wall: No tenderness.  Abdominal:     General: Bowel sounds are normal. There is no distension.     Palpations: Abdomen  is soft. There is no mass.     Tenderness: There is no abdominal tenderness. There is no guarding or rebound.  Musculoskeletal: Normal range of motion.        General: No tenderness.  Lymphadenopathy:     Cervical: No cervical adenopathy.  Skin:    General: Skin is warm and dry.     Findings: Erythema present. No rash.  Neurological:     Mental Status: He is alert and oriented to person, place, and time.     Cranial Nerves: No cranial nerve deficit.     Motor: No abnormal muscle tone.     Coordination: Coordination normal.     Gait: Gait normal.     Deep Tendon Reflexes: Reflexes are normal and symmetric.  Psychiatric:        Behavior: Behavior normal.        Thought Content: Thought content normal.        Judgment: Judgment normal.     Anterior left shoulder scar is pink with some irregularities noted.  There is 4 x 3 cm erythematous swelling mostly caudal  from the scar.  It is slightly sensitive to palpation, not hot.  No drainage  Point-of-care ultrasound reveals a subcutaneous fluid collection consistent with an abscess with surrounding edema.  Procedure note:  Incision and Drainage of a presumed abscess   Indication : a localized collection of fluid that is tender and not spontaneously resolving - L anterior shoulder.    Risks including unsuccessful procedure , possible need for a repeat procedure due to pus accumulation, scar formation, and others as well as benefits were explained to the patient in detail. Written consent was obtained/signed.    The patient was placed in a decubitus position. The area of an abscess was prepped with povidone-iodine and draped in a sterile fashion. Local anesthesia with  3 cc of 2% lidocaine and epinephrine  was administered.  1 cm incision with #11strait blade was made. About 3 cc of non-purulent serous sanguinous material was expressed. The  cavity was explored with a sterile hemostat and the walled- off pockets and septae were broken down  bluntly. The cavity was irrigated with the rest of the anesthetic in the syringe and packed with 2-3 inches of  the iodoform gauze.   The wound was dressed with antibiotic ointment and Telfa pad.  Tolerated well. Complications: None.   Wound instructions provided.   Wound instructions : change dressing once a day or twice a day is needed. Change dressing after  shower in the morning.  Pat dry the wound with gauze. Pull out packing on Saturday. Re-dress wound with antibiotic ointment and Telfa pad or a Band-Aid of appropriate size.   Please contact us if you notice a recollection of pus  in the abscess fever and chills increased pain redness red streaks near the abscess increased swelling in the area.         Lab Results  Component Value Date   WBC 6.1 09/02/2018   HGB 14.4 09/02/2018   HCT 43.8 09/02/2018   PLT 323 09/02/2018   GLUCOSE 105 (H) 09/02/2018   CHOL 172 10/15/2017   TRIG 65.0 10/15/2017   HDL 58.00 10/15/2017   LDLDIRECT 130.9 12/08/2012   LDLCALC 101 (H) 10/15/2017   ALT 16 09/02/2018   AST 14 09/02/2018   NA 138 09/02/2018   K 4.8 09/02/2018   CL 104 09/02/2018   CREATININE 1.08 09/02/2018   BUN 20 09/02/2018   CO2 28 09/02/2018   TSH 0.87 10/15/2017   PSA 4.12 (H) 10/15/2017   INR 1.09 06/03/2012   HGBA1C 5.4 12/21/2013    No results found.  Assessment & Plan:   There are no diagnoses linked to this encounter.   No orders of the defined types were placed in this encounter.    Follow-up: No follow-ups on file.  Walker Kehr, MD

## 2018-09-10 NOTE — Telephone Encounter (Signed)
Copied from [place] [id number]. Topic: Appointment Scheduling - Scheduling Inquiry for Clinic >> Sep 10, 2018  9:06 AM Rainey Pines A wrote: Patient is requesting an appointment for today in regards to getting a blister on his shoulder drained. Patient was told by Dr. [name] that he needs to be seen today to have this done. Attempted to reach office 3x >> Sep 10, 2018  9:28 AM Para Skeans A wrote: Will Dr. [name] double book to see this patient today?

## 2018-09-10 NOTE — Progress Notes (Signed)
   Subjective:    Patient ID: Chris Obrien, male    DOB: 01/17/1950, 68 y.o.   MRN: [id number]  HPI Here for follow up of superficial shoulder infection over previous incision. Has been draining, no new issues. No associated fever or chills. Continues on Keflex.  Still draining but much improved.  Now really just bloody with a little pus mixed in.  No associated fever or chills.    Review of Systems  Constitutional: Negative for fever.  Gastrointestinal: Negative for diarrhea and nausea.       Objective:   Physical Exam Eyes:     General: No scleral icterus. Musculoskeletal:     Comments: Left shoulder with some much less fluctuance at inferior part of incision and no drainage noted; no warmth; erythema much improved  Left shoulder joint with normal ROM, no warmth  Skin:    Findings: No rash.  Neurological:     Mental Status: He is alert.  Psychiatric:        Mood and Affect: Mood normal.           Assessment & Plan:

## 2018-09-11 ENCOUNTER — Encounter: Payer: Self-pay | Admitting: Internal Medicine

## 2018-09-11 NOTE — Assessment & Plan Note (Signed)
9/20 Yonathan  is status post left shoulder revision done on July 28, 2018 by Dr. [name]. About 1 week ago [name] noticed swelling and blistering of the area under the scar.  It ruptured eventually and [name] squeezed out a lot of pus.  He saw Dr. [name] and was prescribed Keflex p.o.  The swelling has relapsed.  There is no drainage now.  There is no fever, chills or night sweats.  Dawuan was worked in today after Dr. Audry Pili recommended incision and drainage of the left shoulder scar swelling re-occurrence.  Point-of-care ultrasound findings were consistent with abscess and cellulitis.  However, incision and drainage procedure did not reveal any purulence and was more consistent with a seroma.  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  She will postpone his trip if problems over the weekend.  I elected to give [name] 1 month prescription for Keflex p.o. just in case.  He will follow-up with Dr.Comer Align 1 daily p.o.

## 2018-09-11 NOTE — Assessment & Plan Note (Addendum)
9/20 Chris Obrien  is status post left shoulder revision done on July 28, 2018 by Dr. [name]. About 1 week ago [name] noticed swelling and blistering of the area under the scar.  It ruptured eventually and [name] squeezed out a lot of pus.  He saw Dr. [name] and was prescribed Keflex p.o.  The swelling has relapsed.  There is no drainage now.  There is no fever, chills or night sweats.  [name] was worked in today after Dr. Audry Pili recommended incision and drainage of the left shoulder scar swelling re-occurrence.  Point-of-care ultrasound findings were consistent with abscess and cellulitis.  However, incision and drainage procedure did not reveal any purulence and was more consistent with a seroma.  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  She will postpone his trip if problems over the weekend.  I elected to give [name] 1 month prescription for Keflex p.o. just in case.  He will follow-up with Dr.Comer Align 1 daily p.o.  IM Rocephin 1 g given today  She can come by on Tuesday morning before his trip for a recheck if needed

## 2018-09-11 NOTE — Assessment & Plan Note (Addendum)
Chris Obrien seems to be able to tolerate Keflex fine.  We will continue with 30 more days IM Rocephin 1 g given today

## 2018-11-24 ENCOUNTER — Other Ambulatory Visit: Payer: Self-pay

## 2018-11-24 ENCOUNTER — Encounter: Payer: Self-pay | Admitting: Internal Medicine

## 2018-11-24 ENCOUNTER — Other Ambulatory Visit (INDEPENDENT_AMBULATORY_CARE_PROVIDER_SITE_OTHER): Payer: 59

## 2018-11-24 ENCOUNTER — Ambulatory Visit (INDEPENDENT_AMBULATORY_CARE_PROVIDER_SITE_OTHER): Payer: 59 | Admitting: Internal Medicine

## 2018-11-24 VITALS — BP 132/84 | HR 77 | Temp 98.4°F | Ht 74.0 in | Wt 245.0 lb

## 2018-11-24 DIAGNOSIS — Z0000 Encounter for general adult medical examination without abnormal findings: Secondary | ICD-10-CM

## 2018-11-24 DIAGNOSIS — R350 Frequency of micturition: Secondary | ICD-10-CM | POA: Diagnosis not present

## 2018-11-24 DIAGNOSIS — Z96619 Presence of unspecified artificial shoulder joint: Secondary | ICD-10-CM

## 2018-11-24 DIAGNOSIS — T8459XD Infection and inflammatory reaction due to other internal joint prosthesis, subsequent encounter: Secondary | ICD-10-CM | POA: Diagnosis not present

## 2018-11-24 DIAGNOSIS — N401 Enlarged prostate with lower urinary tract symptoms: Secondary | ICD-10-CM | POA: Diagnosis not present

## 2018-11-24 DIAGNOSIS — G4700 Insomnia, unspecified: Secondary | ICD-10-CM

## 2018-11-24 DIAGNOSIS — E785 Hyperlipidemia, unspecified: Secondary | ICD-10-CM

## 2018-11-24 LAB — URINALYSIS
Bilirubin Urine: NEGATIVE
Hgb urine dipstick: NEGATIVE
Ketones, ur: NEGATIVE
Leukocytes,Ua: NEGATIVE
Nitrite: NEGATIVE
Specific Gravity, Urine: 1.025 (ref 1.000–1.030)
Total Protein, Urine: NEGATIVE
Urine Glucose: NEGATIVE
Urobilinogen, UA: 0.2 (ref 0.0–1.0)
pH: 5.5 (ref 5.0–8.0)

## 2018-11-24 LAB — BASIC METABOLIC PANEL
BUN: 20 mg/dL (ref 6–23)
CO2: 27 mEq/L (ref 19–32)
Calcium: 8.8 mg/dL (ref 8.4–10.5)
Chloride: 103 mEq/L (ref 96–112)
Creatinine, Ser: 1 mg/dL (ref 0.40–1.50)
GFR: 73.78 mL/min (ref >60.00)
Glucose, Bld: 96 mg/dL (ref 70–99)
Potassium: 4.3 mEq/L (ref 3.5–5.1)
Sodium: 138 mEq/L (ref 135–145)

## 2018-11-24 LAB — LIPID PANEL
Cholesterol: 186 mg/dL (ref 0–200)
HDL: 47.8 mg/dL (ref >39.00)
LDL Cholesterol: 122 mg/dL — ABNORMAL HIGH (ref 0–99)
NonHDL: 137.78
Total CHOL/HDL Ratio: 4
Triglycerides: 78 mg/dL (ref 0.0–149.0)
VLDL: 15.6 mg/dL (ref 0.0–40.0)

## 2018-11-24 LAB — CBC WITH DIFFERENTIAL/PLATELET
Basophils Absolute: 0 10*3/uL (ref 0.0–0.1)
Basophils Relative: 0.7 % (ref 0.0–3.0)
Eosinophils Absolute: 0.2 10*3/uL (ref 0.0–0.7)
Eosinophils Relative: 3.3 % (ref 0.0–5.0)
HCT: 42.3 % (ref 39.0–52.0)
Hemoglobin: 13.9 g/dL (ref 13.0–17.0)
Lymphocytes Relative: 28.5 % (ref 12.0–46.0)
Lymphs Abs: 1.8 10*3/uL (ref 0.7–4.0)
MCHC: 32.9 g/dL (ref 30.0–36.0)
MCV: 83.6 fl (ref 78.0–100.0)
Monocytes Absolute: 0.6 10*3/uL (ref 0.1–1.0)
Monocytes Relative: 9.3 % (ref 3.0–12.0)
Neutro Abs: 3.7 10*3/uL (ref 1.4–7.7)
Neutrophils Relative %: 58.2 % (ref 43.0–77.0)
Platelets: 350 10*3/uL (ref 150.0–400.0)
RBC: 5.06 Mil/uL (ref 4.22–5.81)
RDW: 14 % (ref 11.5–15.5)
WBC: 6.3 10*3/uL (ref 4.0–10.5)

## 2018-11-24 LAB — SEDIMENTATION RATE: Sed Rate: 59 mm/hr — ABNORMAL HIGH (ref 0–20)

## 2018-11-24 LAB — HEPATIC FUNCTION PANEL
ALT: 22 U/L (ref 0–53)
AST: 17 U/L (ref 0–37)
Albumin: 4 g/dL (ref 3.5–5.2)
Alkaline Phosphatase: 79 U/L (ref 39–117)
Bilirubin, Direct: 0.1 mg/dL (ref 0.0–0.3)
Total Bilirubin: 0.4 mg/dL (ref 0.2–1.2)
Total Protein: 6.1 g/dL (ref 6.0–8.3)

## 2018-11-24 LAB — C-REACTIVE PROTEIN: CRP: 8 mg/dL (ref 0.5–20.0)

## 2018-11-24 LAB — TSH: TSH: 1.53 u[IU]/mL (ref 0.35–4.50)

## 2018-11-24 LAB — PSA: PSA: 4.8 ng/mL — ABNORMAL HIGH (ref 0.10–4.00)

## 2018-11-24 NOTE — Patient Instructions (Addendum)
These suggestions will probably help you to improve your metabolism if you are not overweight and to lose weight if you are overweight: 1.  Reduce your consumption of sugars and starches.  Eliminate high fructose corn syrup from your diet.  Reduce your consumption of processed foods.  For desserts try to have seasonal fruits, berries, nuts, cheeses or dark chocolate with more than 70% cacao. 2.  Do not snack 3.  You do not have to eat breakfast.  If you choose to have breakfast-eat plain greek yogurt, eggs, oatmeal (without sugar) 4.  Drink water, freshly brewed unsweetened tea (green, black or herbal) or coffee.  Do not drink sodas including diet sodas , juices, beverages sweetened with artificial sweeteners. 5.  Reduce your consumption of refined grains. 6.  Avoid protein drinks such as Optifast, Slim fast etc. Eat chicken, fish, meat, dairy and beans for your sources of protein 7.  Natural unprocessed fats like cold pressed virgin olive oil, butter, coconut oil are good for you.  Eat avocados 8.  Increase your consumption of fiber.  Fruits, berries, vegetables, whole grains, flaxseeds, Chia seeds, beans, popcorn, nuts, oatmeal are good sources of fiber 9.  Use vinegar in your diet, i.e. apple cider vinegar, red wine or balsamic vinegar 10.  You can try fasting.  For example you can skip breakfast and lunch every other day (24-hour fast) 11.  Stress reduction, good night sleep, relaxation, meditation, yoga and other physical activity is likely to help you to maintain low weight too. 12.  If you drink alcohol, limit your alcohol intake to no more than 2 drinks a day.   Mediterranean diet is good for you. ([employer] has a typical Mediterranean cuisine menu) The Mediterranean diet is a way of eating based on the traditional cuisine of countries bordering the [place] Sea. While there is no single definition of the Mediterranean diet, it is typically high in vegetables, fruits, whole grains,  beans, nut and seeds, and olive oil. The main components of Mediterranean diet include: . Daily consumption of vegetables, fruits, whole grains and healthy fats  . Weekly intake of fish, poultry, beans and eggs  . Moderate portions of dairy products  . Limited intake of red meat Other important elements of the Mediterranean diet are sharing meals with family and friends, enjoying a glass of red wine and being physically active. Health benefits of a Mediterranean diet: A traditional Mediterranean diet consisting of large quantities of fresh fruits and vegetables, nuts, fish and olive oil-coupled with physical activity-can reduce your risk of serious mental and physical health problems by: Preventing heart disease and strokes. Following a Mediterranean diet limits your intake of refined breads, processed foods, and red meat, and encourages drinking red wine instead of hard liquor-all factors that can help prevent heart disease and stroke. Keeping you agile. If you're an older adult, the nutrients gained with a Mediterranean diet may reduce your risk of developing muscle weakness and other signs of frailty by about 70 percent. Reducing the risk of Alzheimer's. Research suggests that the Mediterranean diet may improve cholesterol, blood sugar levels, and overall blood vessel health, which in turn may reduce your risk of Alzheimer's disease or dementia. Halving the risk of Parkinson's disease. The high levels of antioxidants in the Mediterranean diet can prevent cells from undergoing a damaging process called oxidative stress, thereby cutting the risk of Parkinson's disease in half. Increasing longevity. By reducing your risk of developing heart disease or cancer with the Mediterranean diet,   you're reducing your risk of death at any age by 20%. Protecting against type 2 diabetes. A Mediterranean diet is rich in fiber which digests slowly, prevents huge swings in blood sugar, and can help you maintain a  healthy weight.    Cabbage soup recipe that will not make you gain weight: Take 1 small head of cabbage, 1 average pack of celery, 4 green peppers, 4 onions, 2 cans diced tomatoes (they are not available without salt), salt and spices to taste.  Chop cabbage, celery, peppers and onions.  And tomatoes and 2-2.5 liters (2.5 quarts) of water so that it would just cover the vegetables.  Bring to boil.  Add spices and salt.  Turn heat to low/medium and simmer for 20-25 minutes.  Naturally, you can make a smaller batch and change some of the ingredients.   Cardiac CT calcium scoring test $150 Tel # is [phone]   Computed tomography, more commonly known as a CT or CAT scan, is a diagnostic medical imaging test. Like traditional x-rays, it produces multiple images or pictures of the inside of the body. The cross-sectional images generated during a CT scan can be reformatted in multiple planes. They can even generate three-dimensional images. These images can be viewed on a computer monitor, printed on film or by a 3D printer, or transferred to a CD or DVD. CT images of internal organs, bones, soft tissue and blood vessels provide greater detail than traditional x-rays, particularly of soft tissues and blood vessels. A cardiac CT scan for coronary calcium is a non-invasive way of obtaining information about the presence, location and extent of calcified plaque in the coronary arteries--the vessels that supply oxygen-containing blood to the heart muscle. Calcified plaque results when there is a build-up of fat and other substances under the inner layer of the artery. This material can calcify which signals the presence of atherosclerosis, a disease of the vessel wall, also called coronary artery disease (CAD). People with this disease have an increased risk for heart attacks. In addition, over time, progression of plaque build up (CAD) can narrow the arteries or even close off blood flow to the heart. The  result may be chest pain, sometimes called "angina," or a heart attack. Because calcium is a marker of CAD, the amount of calcium detected on a cardiac CT scan is a helpful prognostic tool. The findings on cardiac CT are expressed as a calcium score. Another name for this test is coronary artery calcium scoring.  What are some common uses of the procedure? The goal of cardiac CT scan for calcium scoring is to determine if CAD is present and to what extent, even if there are no symptoms. It is a screening study that may be recommended by a physician for patients with risk factors for CAD but no clinical symptoms. The major risk factors for CAD are: . high blood cholesterol levels  . family history of heart attacks  . diabetes  . high blood pressure  . cigarette smoking  . overweight or obese  . physical inactivity   A negative cardiac CT scan for calcium scoring shows no calcification within the coronary arteries. This suggests that CAD is absent or so minimal it cannot be seen by this technique. The chance of having a heart attack over the next two to five years is very low under these circumstances. A positive test means that CAD is present, regardless of whether or not the patient is experiencing any symptoms. The amount of calcification--expressed   as the calcium score--may help to predict the likelihood of a myocardial infarction (heart attack) in the coming years and helps your medical doctor or cardiologist decide whether the patient may need to take preventive medicine or undertake other measures such as diet and exercise to lower the risk for heart attack. The extent of CAD is graded according to your calcium score:  Calcium Score  Presence of CAD (coronary artery disease)  0 No evidence of CAD   1-10 Minimal evidence of CAD  11-100 Mild evidence of CAD  101-400 Moderate evidence of CAD  Over 400 Extensive evidence of CAD    

## 2018-11-24 NOTE — Progress Notes (Signed)
Subjective:  Patient ID: Chris Obrien, male    DOB: Jun 09, 1950  Age: 68 y.o. MRN: [id number]  CC: No chief complaint on file.   HPI Chris Obrien presents for a well exam  Outpatient Medications Prior to Visit  Medication Sig Dispense Refill  . celecoxib (CELEBREX) 200 MG capsule Take 1 capsule (200 mg total) by mouth every 12 (twelve) hours. 99991111 capsule 3  . folic acid (FOLVITE) 1 MG tablet Take 1 mg by mouth every morning.     . Glucosamine-Chondroit-Vit C-Mn (GLUCOSAMINE CHONDR 1500 COMPLX) CAPS Take 1 capsule by mouth 2 (two) times daily.     . hydrocortisone 2.5 % cream Apply topically 2 (two) times daily. 30 g 0  . Multiple Vitamin (MULTIVITAMIN WITH MINERALS) TABS Take 1 tablet by mouth every morning.     . mupirocin ointment (BACTROBAN) 2 % On leg wound w/dressing change qd or bid 30 g 0  . Probiotic Product (ALIGN) 4 MG CAPS Take 1 capsule (4 mg total) by mouth daily. 30 capsule 1  . tamsulosin (FLOMAX) 0.4 MG CAPS capsule Take 0.4 mg by mouth.    . zolpidem (AMBIEN) 10 MG tablet Take 0.5-1 tablets (5-10 mg total) by mouth at bedtime as needed. Can use half tab if sufficient. 90 tablet 1  . cephALEXin (KEFLEX) 500 MG capsule Take 1 capsule (500 mg total) by mouth 4 (four) times daily. 120 capsule 1   No facility-administered medications prior to visit.     ROS: Review of Systems  Constitutional: Negative for appetite change, fatigue, fever and unexpected weight change.  HENT: Negative for congestion, nosebleeds, sneezing, sore throat and trouble swallowing.   Eyes: Negative for itching and visual disturbance.  Respiratory: Negative for cough.   Cardiovascular: Negative for chest pain, palpitations and leg swelling.  Gastrointestinal: Negative for abdominal distention, blood in stool, diarrhea and nausea.  Genitourinary: Negative for frequency and hematuria.  Musculoskeletal: Positive for arthralgias. Negative for back pain, gait problem, joint swelling and  neck pain.  Skin: Positive for color change. Negative for rash.  Neurological: Negative for dizziness, tremors, speech difficulty and weakness.  Psychiatric/Behavioral: Negative for agitation, dysphoric mood and sleep disturbance. The patient is not nervous/anxious.     Objective:  BP 132/84 (BP Location: Left Arm, Patient Position: Sitting, Cuff Size: Large)   Pulse 77   Temp 98.4 F (36.9 C) (Oral)   Ht 6\' 2"  (1.88 m)   Wt 245 lb (111.1 kg)   SpO2 96%   BMI 31.46 kg/m   BP Readings from Last 3 Encounters:  11/24/18 132/84  09/10/18 (!) 160/84  09/10/18 (!) 169/96    Wt Readings from Last 3 Encounters:  11/24/18 245 lb (111.1 kg)  09/10/18 244 lb (110.7 kg)  02/10/18 249 lb (112.9 kg)    Physical Exam Constitutional:      General: He is not in acute distress.    Appearance: He is well-developed.     Comments: NAD  Eyes:     Conjunctiva/sclera: Conjunctivae normal.     Pupils: Pupils are equal, round, and reactive to light.  Neck:     Musculoskeletal: Normal range of motion.     Thyroid: No thyromegaly.     Vascular: No JVD.  Cardiovascular:     Rate and Rhythm: Normal rate and regular rhythm.     Heart sounds: Normal heart sounds. No murmur. No friction rub. No gallop.   Pulmonary:     Effort:  Pulmonary effort is normal. No respiratory distress.     Breath sounds: Normal breath sounds. No wheezing or rales.  Chest:     Chest wall: No tenderness.  Abdominal:     General: Bowel sounds are normal. There is no distension.     Palpations: Abdomen is soft. There is no mass.     Tenderness: There is no abdominal tenderness. There is no guarding or rebound.  Musculoskeletal: Normal range of motion.        General: No tenderness.  Lymphadenopathy:     Cervical: No cervical adenopathy.  Skin:    General: Skin is warm and dry.     Findings: Erythema present. No rash.  Neurological:     Mental Status: He is alert and oriented to person, place, and time.     Cranial  Nerves: No cranial nerve deficit.     Motor: No abnormal muscle tone.     Coordination: Coordination normal.     Gait: Gait normal.     Deep Tendon Reflexes: Reflexes are normal and symmetric.  Psychiatric:        Behavior: Behavior normal.        Thought Content: Thought content normal.        Judgment: Judgment normal.    Rectal deffered - Chris Obrien had one  in  April 2020  Lab Results  Component Value Date   WBC 6.1 09/02/2018   HGB 14.4 09/02/2018   HCT 43.8 09/02/2018   PLT 323 09/02/2018   GLUCOSE 105 (H) 09/02/2018   CHOL 172 10/15/2017   TRIG 65.0 10/15/2017   HDL 58.00 10/15/2017   LDLDIRECT 130.9 12/08/2012   LDLCALC 101 (H) 10/15/2017   ALT 16 09/02/2018   AST 14 09/02/2018   NA 138 09/02/2018   K 4.8 09/02/2018   CL 104 09/02/2018   CREATININE 1.08 09/02/2018   BUN 20 09/02/2018   CO2 28 09/02/2018   TSH 0.87 10/15/2017   PSA 4.12 (H) 10/15/2017   INR 1.09 06/03/2012   HGBA1C 5.4 12/21/2013    No results found.  Assessment & Plan:   There are no diagnoses linked to this encounter.   No orders of the defined types were placed in this encounter.    Follow-up: No follow-ups on file.  Walker Kehr, MD

## 2018-11-24 NOTE — Assessment & Plan Note (Signed)
CRP ESR No sx's

## 2018-11-24 NOTE — Assessment & Plan Note (Signed)
Zolpidem prn  Potential benefits of a short term benzodiazepines  use as well as potential risks  and complications were explained to the patient and were aknowledged.   

## 2018-11-24 NOTE — Assessment & Plan Note (Addendum)
We discussed age appropriate health related issues, including available/recomended screening tests and vaccinations. We discussed a need for adhering to healthy diet and exercise. Labs were ordered to be later reviewed . All questions were answered.  CT Ca score - will schedule

## 2018-11-29 ENCOUNTER — Encounter: Payer: Self-pay | Admitting: Internal Medicine

## 2018-12-23 ENCOUNTER — Other Ambulatory Visit: Payer: Self-pay

## 2018-12-23 ENCOUNTER — Ambulatory Visit (INDEPENDENT_AMBULATORY_CARE_PROVIDER_SITE_OTHER)
Admission: RE | Admit: 2018-12-23 | Discharge: 2018-12-23 | Disposition: A | Discharge status: Home / Self Care | Payer: Self-pay | Source: Ambulatory Visit | Attending: Internal Medicine | Admitting: Internal Medicine

## 2018-12-23 DIAGNOSIS — E785 Hyperlipidemia, unspecified: Secondary | ICD-10-CM

## 2018-12-26 ENCOUNTER — Other Ambulatory Visit: Payer: Self-pay | Admitting: Internal Medicine

## 2018-12-26 ENCOUNTER — Encounter: Payer: Self-pay | Admitting: Internal Medicine

## 2018-12-26 DIAGNOSIS — I77810 Thoracic aortic ectasia: Secondary | ICD-10-CM | POA: Insufficient documentation

## 2018-12-26 DIAGNOSIS — I270 Primary pulmonary hypertension: Secondary | ICD-10-CM | POA: Insufficient documentation

## 2018-12-26 MED ORDER — VERAPAMIL HCL ER 120 MG PO CP24: 120.0000 mg | ORAL_CAPSULE | Freq: Every day | ORAL | 11 refills | Status: DC

## 2019-01-05 ENCOUNTER — Other Ambulatory Visit: Payer: Self-pay | Admitting: Internal Medicine

## 2019-01-05 DIAGNOSIS — I272 Pulmonary hypertension, unspecified: Secondary | ICD-10-CM

## 2019-01-19 ENCOUNTER — Encounter: Payer: Self-pay | Admitting: Family

## 2019-01-19 ENCOUNTER — Ambulatory Visit: Payer: 59 | Admitting: Family

## 2019-01-24 NOTE — Progress Notes (Signed)
Cardiology Office Note:   Date:  01/25/2019  NAME:  Chris Obrien    MRN: [id number] DOB:  [birth year]   PCP:  Cassandria Anger, MD  Cardiologist:  No primary care provider on file.  Electrophysiologist:  None   Referring MD: Cassandria Anger, MD   Chief Complaint  Patient presents with  . New Patient (Initial Visit)   History of Present Illness:   Chris Obrien is a 69 y.o. male with a hx of arthritis who is being seen today for the evaluation of aortic dilation, pulmonary arterial hypertension at the request of Plotnikov, Evie Lacks, MD.  He recently underwent coronary artery calcium scoring.  He was noted to have a dilated aorta up to 4.3 cm and enlargement of the pulmonary arterial system.  He was then referred to [place] with concerns for pulmonary hypertension.  He has no prior history of cardiovascular disease.  No history of hypertension or diabetes.  Blood pressure well controlled today.  He was placed on verapamil by his primary care physician due to the enlarged aorta.  I discussed with him that this is probably upper limits of normal for his height and age.  He has no history of genetically mediated aortic disease.  No family history of this either.  He also was noted to have an enlarged pulmonary artery.  He does have sleep apnea and is unable to use the machine.  He reports he uses melatonin and does well with this.  I reviewed the results of his calcium scoring which shows a calcium score of 0.  Most recent LDL cholesterol 122.  He is not take medications.  He is extremely active.  He exercises 5-6 times per week.  He says he can walk several miles and run at times.  He also cycles.  He also lifts weights without any issues.  He used to smoke but quit 40 years ago.  No excessive alcohol use.  Total cholesterol 186 HDL 47 LDL 122 Triglycerides 78  Past Medical History: Past Medical History:  Diagnosis Date  . Acute reactive arthritis ([place]) per pcp note from 03-30-2012     right pre-patella---  secondary contact dermitis with poison ivy exposure 03-26-2012  . Angio-edema   . Arthritis   . Benign neoplasm of colon   . BPH (benign prostatic hypertrophy)    hx small stream  . 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 Insomnia, unspecified   . Oligospermia   . OSA on CPAP    PT LOST  WEIGHT - NO LONGER USING CPAP  . Pain    RIGHT SHOULDER DELTOID MUSCLE ATROPHY - LOSS OF STRENGT VERY LIMITED RANGE OF MOTTION  . Right knee skin infection   . Swelling of joint of right knee   . Urticaria     Past Surgical History: Past Surgical History:  Procedure Laterality Date  . ADENOIDECTOMY    . EXCISIONAL TOTAL KNEE ARTHROPLASTY WITH ANTIBIOTIC SPACERS Right 04/27/2012   Procedure: EXCISIONAL TOTAL KNEE ARTHROPLASTY WITH ANTIBIOTIC SPACERS;  Surgeon: Mauri Pole, MD;  Location: [facility];  Service: Orthopedics;  Laterality: Right;  SAME DAY LABS  . IRRIGATION AND DEBRIDEMENT KNEE Right 04/02/2012   Procedure: IRRIGATION AND DEBRIDEMENT RIGHT PRE-PATELLA  ;  Surgeon: Mauri Pole, MD;  Location: [facility];  Service: Orthopedics;  Laterality: Right;  . KNEE ARTHROSCOPY Right aug 2012  . SINOSCOPY    . TONSILLECTOMY    . TOTAL KNEE ARTHROPLASTY  01/28/2012  Procedure: TOTAL KNEE ARTHROPLASTY;  Surgeon: Mauri Pole, MD;  Location: [facility];  Service: Orthopedics;  Laterality: Right;  . TOTAL KNEE ARTHROPLASTY Left 07/17/2015   Procedure: LEFT TOTAL KNEE ARTHROPLASTY;  Surgeon: Paralee Cancel, MD;  Location: [facility];  Service: Orthopedics;  Laterality: Left;  . TOTAL KNEE REVISION Right 06/15/2012   Procedure: reimplantation of right total knee ;  Surgeon: Mauri Pole, MD;  Location: [facility];  Service: Orthopedics;  Laterality: Right;  . ULNAR NERVE REPAIR Right 2008    Current Medications: Current Meds  Medication Sig  . celecoxib (CELEBREX) 200 MG capsule Take 1 capsule (200 mg total) by mouth every 12 (twelve) hours.  . Cephalexin (KEFLEX PO) Take by  mouth.  . folic acid (FOLVITE) 1 MG tablet Take 1 mg by mouth every morning.   . Glucosamine-Chondroit-Vit C-Mn (GLUCOSAMINE CHONDR 1500 COMPLX) CAPS Take 1 capsule by mouth 2 (two) times daily.   . hydrocortisone 2.5 % cream Apply topically 2 (two) times daily.  . Multiple Vitamin (MULTIVITAMIN WITH MINERALS) TABS Take 1 tablet by mouth every morning.   . mupirocin ointment (BACTROBAN) 2 % On leg wound w/dressing change qd or bid  . Probiotic Product (ALIGN) 4 MG CAPS Take 1 capsule (4 mg total) by mouth daily.  . tamsulosin (FLOMAX) 0.4 MG CAPS capsule Take 0.4 mg by mouth.  . verapamil (VERELAN PM) 120 MG 24 hr capsule Take 1 capsule (120 mg total) by mouth at bedtime.  [name] zolpidem (AMBIEN) 10 MG tablet Take 0.5-1 tablets (5-10 mg total) by mouth at bedtime as needed. Can use half tab if sufficient.     Allergies:    Amoxicillin, Vancomycin, Tramadol, Oxycodone, and Penicillins   Social History: Social History   Socioeconomic History  . Marital status: Married    Spouse name: Not on file  . Number of children: Not on file  . Years of education: 78  . Highest education level: Not on file  Occupational History  . Occupation: Freight forwarder    Comment: Nature conservation officer  . Occupation: MANAGER    Employer: [employer]  Tobacco Use  . Smoking status: Former Smoker    Types: Cigars    Quit date: 1990    Years since quitting: 31.0  . Smokeless tobacco: Never Used  Substance and Sexual Activity  . Alcohol use: Yes    Comment: occasionally  . Drug use: No  . Sexual activity: Not on file  Other Topics Concern  . Not on file  Social History Narrative   Caffeine 4 cups avg daily, 4 cans soda.   Social Determinants of Health   Financial Resource Strain:   . Difficulty of Paying Living Expenses: Not on file  Food Insecurity:   . Worried About Charity fundraiser in the Last Year: Not on file  . Ran Out of Food in the Last Year: Not on file  Transportation Needs:   . Lack of  Transportation (Medical): Not on file  . Lack of Transportation (Non-Medical): Not on file  Physical Activity:   . Days of Exercise per Week: Not on file  . Minutes of Exercise per Session: Not on file  Stress:   . Feeling of Stress : Not on file  Social Connections:   . Frequency of Communication with Friends and Family: Not on file  . Frequency of Social Gatherings with Friends and Family: Not on file  . Attends Religious Services: Not on file  . Active Member of  Clubs or Organizations: Not on file  . Attends Archivist Meetings: Not on file  . Marital Status: Not on file    Family History: The patient's family history includes Alzheimer's disease in his mother; Arthritis in his father; Diabetes in his brother; Hyperlipidemia in an other family member.  ROS:   All other ROS reviewed and negative. Pertinent positives noted in the HPI.     EKGs/Labs/Other Studies Reviewed:   The following studies were personally reviewed by me today:  EKG:  EKG is ordered today.  The ekg ordered today demonstrates normal sinus rhythm, heart rate 68, no acute ST-T changes, no evidence of prior infarction, normal EKG, and was personally reviewed by me.   CT CAC Score Coronary calcium score of 0. This was 0 percentile for age and sex matched control.  1. 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 Recommend annual imaging followup by CTA or MRA. This recommendation follows 2010 ACCF/AHA/AATS/ACR/ASA/SCA/SCAI/SIR/STS/SVM Guidelines for the Diagnosis and Management of Patients with Thoracic Aortic Disease. Circulation. 2010; [id number]. Aortic aneurysm NOS (ICD10-I71.9). 2. Enlarged pulmonic trunk, indicative of pulmonary arterial hypertension.  Recent Labs: 11/24/2018: ALT 22; BUN 20; Creatinine, Ser 1.00; Hemoglobin 13.9; Platelets 350.0; Potassium 4.3; Sodium 138; TSH 1.53   Recent Lipid Panel    Component Value Date/Time   CHOL  186 11/24/2018 1636   TRIG 78.0 11/24/2018 1636   HDL 47.80 11/24/2018 1636   CHOLHDL 4 11/24/2018 1636   VLDL 15.6 11/24/2018 1636   LDLCALC 122 (H) 11/24/2018 1636   LDLDIRECT 130.9 12/08/2012 0844    Physical Exam:   VS:  BP 130/80 (BP Location: Left Arm, Patient Position: Sitting, Cuff Size: Large)   Pulse 68   Temp (!) 97.5 F (36.4 C)   Ht 6\' 2"  (1.88 m)   BMI 31.46 kg/m    Wt Readings from Last 3 Encounters:  11/24/18 245 lb (111.1 kg)  09/10/18 244 lb (110.7 kg)  02/10/18 249 lb (112.9 kg)    General: Well nourished, well developed, in no acute distress Heart: Atraumatic, normal size  Eyes: PEERLA, EOMI  Neck: Supple, no JVD Endocrine: No thryomegaly Cardiac: Normal S1, S2; RRR; no murmurs, rubs, or gallops Lungs: Clear to auscultation bilaterally, no wheezing, rhonchi or rales  Abd: Soft, nontender, no hepatomegaly  Ext: No edema, pulses 2+ Musculoskeletal: No deformities, BUE and BLE strength normal and equal Skin: Warm and dry, no rashes   Neuro: Alert and oriented to person, place, time, and situation, CNII-XII grossly intact, no focal deficits  Psych: Normal mood and affect   ASSESSMENT:   Chris Obrien is a 69 y.o. male who presents for the following: 1. Aortic ectasia (HCC)   2. Pulmonary hypertension, unspecified ([place])     PLAN:   1. Aortic ectasia (HCC) 2. Pulmonary hypertension, unspecified ([place]) -Most recent calcium score 0.  No strong indication for statin.  LDL 122 probably not an issue for him.  He is extremely active and healthy.  Noted to have an ascending aorta up to 4.3 cm in a nongated noncontrast study.  I suspect it is much less than this.  Given his age I am really not concerned about this.  He can stop his verapamil.  His pulmonary artery was noted to be enlarged.  We will check an echocardiogram to get a good look at the aorta as well as pulmonary artery pressure.  I suspect this could be slightly up from untreated sleep apnea.  His EKG  is very normal with no evidence of right heart strain.  I think this is all gone to be very benign.  We will plan to see him on a yearly basis.  We can then decide based on his echo if we need to repeat any imaging of his aorta.  Overall, I think this is going to be very benign for him and not a major issue.  Disposition: Return in about 1 year (around 01/25/2020).  Medication Adjustments/Labs and Tests Ordered: Current medicines are reviewed at length with the patient today.  Concerns regarding medicines are outlined above.  Orders Placed This Encounter  Procedures  . EKG 12-Lead  . ECHOCARDIOGRAM COMPLETE   No orders of the defined types were placed in this encounter.   Patient Instructions  Medication Instructions:  The current medical regimen is effective;  continue present plan and medications.  *If you need a refill on your cardiac medications before your next appointment, please call your pharmacy*  Testing/Procedures: Echocardiogram - Your physician has requested that you have an echocardiogram. Echocardiography is a painless test that uses sound waves to create images of your heart. It provides your doctor with information about the size and shape of your heart and how well your heart's chambers and valves are working. This procedure takes approximately one hour. There are no restrictions for this procedure. This will be performed at our [facility] location - [address], [address].  Follow-Up: At [facility], you and your health needs are our priority.  As part of our continuing mission to provide you with exceptional heart care, we have created designated Provider Care Teams.  These Care Teams include your primary Cardiologist (physician) and Advanced Practice Providers (APPs -  Physician Assistants and Nurse Practitioners) who all work together to provide you with the care you need, when you need it.  Your next appointment:   12 month(s)  The format for your next  appointment:   In Person  Provider:   Eleonore Chiquito, MD        Signed, Addison Naegeli. Audie Box, [place]  [address] [place], [place] [address] [phone]  01/25/2019 10:56 AM

## 2019-01-25 ENCOUNTER — Encounter: Payer: Self-pay | Admitting: Cardiovascular Disease

## 2019-01-25 ENCOUNTER — Ambulatory Visit: Payer: 59 | Admitting: Cardiovascular Disease

## 2019-01-25 ENCOUNTER — Other Ambulatory Visit: Payer: Self-pay

## 2019-01-25 VITALS — BP 130/80 | HR 68 | Temp 97.5°F | Ht 74.0 in

## 2019-01-25 DIAGNOSIS — I77819 Aortic ectasia, unspecified site: Secondary | ICD-10-CM

## 2019-01-25 DIAGNOSIS — I272 Pulmonary hypertension, unspecified: Secondary | ICD-10-CM

## 2019-01-25 NOTE — Patient Instructions (Addendum)
Medication Instructions:  The current medical regimen is effective;  continue present plan and medications.  *If you need a refill on your cardiac medications before your next appointment, please call your pharmacy*  Testing/Procedures: Echocardiogram - Your physician has requested that you have an echocardiogram. Echocardiography is a painless test that uses sound waves to create images of your heart. It provides your doctor with information about the size and shape of your heart and how well your heart's chambers and valves are working. This procedure takes approximately one hour. There are no restrictions for this procedure. This will be performed at our [address] location - [address], [address].   Follow-Up: At [facility], you and your health needs are our priority.  As part of our continuing mission to provide you with exceptional heart care, we have created designated Provider Care Teams.  These Care Teams include your primary Cardiologist (physician) and Advanced Practice Providers (APPs -  Physician Assistants and Nurse Practitioners) who all work together to provide you with the care you need, when you need it.  Your next appointment:   12 month(s)  The format for your next appointment:   In Person  Provider:    O'Neal, MD     

## 2019-02-03 DIAGNOSIS — Z96619 Presence of unspecified artificial shoulder joint: Secondary | ICD-10-CM

## 2019-02-03 DIAGNOSIS — T8459XD Infection and inflammatory reaction due to other internal joint prosthesis, subsequent encounter: Secondary | ICD-10-CM

## 2019-02-04 ENCOUNTER — Other Ambulatory Visit (INDEPENDENT_AMBULATORY_CARE_PROVIDER_SITE_OTHER): Payer: 59

## 2019-02-04 DIAGNOSIS — T8459XD Infection and inflammatory reaction due to other internal joint prosthesis, subsequent encounter: Secondary | ICD-10-CM

## 2019-02-04 DIAGNOSIS — Z96619 Presence of unspecified artificial shoulder joint: Secondary | ICD-10-CM | POA: Diagnosis not present

## 2019-02-04 LAB — CBC WITH DIFFERENTIAL/PLATELET
Basophils Absolute: 0 10*3/uL (ref 0.0–0.1)
Basophils Relative: 0.5 % (ref 0.0–3.0)
Eosinophils Absolute: 0.4 10*3/uL (ref 0.0–0.7)
Eosinophils Relative: 6 % — ABNORMAL HIGH (ref 0.0–5.0)
HCT: 37.9 % — ABNORMAL LOW (ref 39.0–52.0)
Hemoglobin: 12.4 g/dL — ABNORMAL LOW (ref 13.0–17.0)
Lymphocytes Relative: 20.8 % (ref 12.0–46.0)
Lymphs Abs: 1.3 10*3/uL (ref 0.7–4.0)
MCHC: 32.7 g/dL (ref 30.0–36.0)
MCV: 81.4 fl (ref 78.0–100.0)
Monocytes Absolute: 0.6 10*3/uL (ref 0.1–1.0)
Monocytes Relative: 9.5 % (ref 3.0–12.0)
Neutro Abs: 3.9 10*3/uL (ref 1.4–7.7)
Neutrophils Relative %: 63.2 % (ref 43.0–77.0)
Platelets: 344 10*3/uL (ref 150.0–400.0)
RBC: 4.66 Mil/uL (ref 4.22–5.81)
RDW: 15.3 % (ref 11.5–15.5)
WBC: 6.2 10*3/uL (ref 4.0–10.5)

## 2019-02-04 LAB — SEDIMENTATION RATE: Sed Rate: 48 mm/hr — ABNORMAL HIGH (ref 0–20)

## 2019-02-17 ENCOUNTER — Telehealth: Payer: Self-pay | Admitting: *Deleted

## 2019-02-17 NOTE — Telephone Encounter (Signed)
Wife report husband had his 1st Covid shot " Moderna". Inform will update pt chart w/info.Marland KitchenJohny Chess

## 2019-06-14 ENCOUNTER — Other Ambulatory Visit: Payer: Self-pay

## 2019-06-14 ENCOUNTER — Encounter: Payer: Self-pay | Admitting: Nurse Practitioner

## 2019-06-14 ENCOUNTER — Ambulatory Visit (INDEPENDENT_AMBULATORY_CARE_PROVIDER_SITE_OTHER): Payer: 59 | Admitting: Nurse Practitioner

## 2019-06-14 VITALS — BP 142/80 | HR 68 | Temp 97.7°F | Ht 74.0 in | Wt 241.6 lb

## 2019-06-14 DIAGNOSIS — L309 Dermatitis, unspecified: Secondary | ICD-10-CM | POA: Diagnosis not present

## 2019-06-14 MED ORDER — TRIAMCINOLONE ACETONIDE 0.1 % EX CREA: 1.0000 "application " | TOPICAL_CREAM | Freq: Two times a day (BID) | CUTANEOUS | 0 refills | Status: DC

## 2019-06-14 NOTE — Progress Notes (Signed)
Subjective:  Patient ID: Chris Obrien, male    DOB: 1950/04/30  Age: 69 y.o. MRN: [id number]  CC: Shoulder Pain (pt c/o of left shoulder feel warm and red,discomfort/was moving stuff 1 mo ago--had surgrey back in 07/2018 on this shoulder/ )  HPI Mr. Kari presents with persistent redness over left shoulder surgical scar. Onset after surgery in 07/2018. Redness got worse in 11/2018.Redness has improved, but persistent. He was evaluated by pcp, infectious disease specialist and dermatologist. Treated in past with Bactroban ointment and oral abx (keflex, bactrim). He reports no improvement. Today he reports intermittent increased warmth. He states redness has not worsened, buthe want redness to completely resolve. He denies any fever or joint effusion or injury or limited ROM or pain with joint movement.   I reviewed lab results and notes from pcp, and notes from Dr. [name]. I compared pictures from patient's phone and those in his chart 08/2018,11/2018 and 01/2019. Unable to find notes from dermatology.  Reviewed past Medical, Social and Family history today.  Outpatient Medications Prior to Visit  Medication Sig Dispense Refill  . aspirin EC 81 MG tablet Take 81 mg by mouth daily.    . celecoxib (CELEBREX) 200 MG capsule Take 1 capsule (200 mg total) by mouth every 12 (twelve) hours. 702 capsule 3  . folic acid (FOLVITE) 1 MG tablet Take 1 mg by mouth every morning.     . Glucosamine-Chondroit-Vit C-Mn (GLUCOSAMINE CHONDR 1500 COMPLX) CAPS Take 1 capsule by mouth 2 (two) times daily.     . Multiple Vitamin (MULTIVITAMIN WITH MINERALS) TABS Take 1 tablet by mouth every morning.     . tamsulosin (FLOMAX) 0.4 MG CAPS capsule Take 0.4 mg by mouth.    . zolpidem (AMBIEN) 10 MG tablet Take 0.5-1 tablets (5-10 mg total) by mouth at bedtime as needed. Can use half tab if sufficient. 90 tablet 1  . Cephalexin (KEFLEX PO) Take by mouth.    . hydrocortisone 2.5 % cream Apply topically 2 (two) times  daily. (Patient not taking: Reported on 06/14/2019) 30 g 0  . mupirocin ointment (BACTROBAN) 2 % On leg wound w/dressing change qd or bid (Patient not taking: Reported on 06/14/2019) 30 g 0  . Probiotic Product (ALIGN) 4 MG CAPS Take 1 capsule (4 mg total) by mouth daily. (Patient not taking: Reported on 06/14/2019) 30 capsule 1  . verapamil (VERELAN PM) 120 MG 24 hr capsule Take 1 capsule (120 mg total) by mouth at bedtime. (Patient not taking: Reported on 06/14/2019) 30 capsule 11   No facility-administered medications prior to visit.    ROS See HPI  Objective:  BP (!) 142/80   Pulse 68   Temp 97.7 F (36.5 C) (Tympanic)   Ht 6\' 2"  (1.88 m)   Wt 241 lb 9.6 oz (109.6 kg)   SpO2 95%   BMI 31.02 kg/m   BP Readings from Last 3 Encounters:  06/14/19 (!) 142/80  01/25/19 130/80  11/24/18 132/84    Wt Readings from Last 3 Encounters:  06/14/19 241 lb 9.6 oz (109.6 kg)  11/24/18 245 lb (111.1 kg)  09/10/18 244 lb (110.7 kg)    Physical Exam Cardiovascular:     Pulses: Normal pulses.  Pulmonary:     Effort: Pulmonary effort is normal.  Musculoskeletal:        General: No swelling or tenderness.     Left shoulder: No swelling, effusion, tenderness or bony tenderness. Normal range of motion. Normal strength.  Arms:  Skin:    Findings: Lesion present.  Neurological:     Mental Status: He is alert and oriented to person, place, and time.     Lab Results  Component Value Date   WBC 6.2 02/04/2019   HGB 12.4 (L) 02/04/2019   HCT 37.9 (L) 02/04/2019   PLT 344.0 02/04/2019   GLUCOSE 96 11/24/2018   CHOL 186 11/24/2018   TRIG 78.0 11/24/2018   HDL 47.80 11/24/2018   LDLDIRECT 130.9 12/08/2012   LDLCALC 122 (H) 11/24/2018   ALT 22 11/24/2018   AST 17 11/24/2018   NA 138 11/24/2018   K 4.3 11/24/2018   CL 103 11/24/2018   CREATININE 1.00 11/24/2018   BUN 20 11/24/2018   CO2 27 11/24/2018   TSH 1.53 11/24/2018   PSA 4.80 (H) 11/24/2018   INR 1.09 06/03/2012    HGBA1C 5.4 12/21/2013   Assessment & Plan:  This visit occurred during the SARS-CoV-2 public health emergency.  Safety protocols were in place, including screening questions prior to the visit, additional usage of staff PPE, and extensive cleaning of exam room while observing appropriate contact time as indicated for disinfecting solutions.   Erling was seen today for shoulder pain.  Diagnoses and all orders for this visit:  Dermatitis -     triamcinolone cream (KENALOG) 0.1 %; Apply 1 application topically 2 (two) times daily.   I have discontinued Jahni Nazar. Erney "Dave Colpitts"'s hydrocortisone, mupirocin ointment, Align, and verapamil. I am also having him start on triamcinolone cream. Additionally, I am having him maintain his folic acid, multivitamin with minerals, Glucosamine Chondr 1500 Complx, tamsulosin, celecoxib, zolpidem, Cephalexin (KEFLEX PO), and aspirin EC.  Meds ordered this encounter  Medications  . triamcinolone cream (KENALOG) 0.1 %    Sig: Apply 1 application topically 2 (two) times daily.    Dispense:  30 g    Refill:  0    Order Specific Question:   Supervising Provider    Answer:   Ronnald Nian [[id number]]    Problem List Items Addressed This Visit    None    Visit Diagnoses    Dermatitis    -  Primary   Relevant Medications   triamcinolone cream (KENALOG) 0.1 %      Follow-up: Return if symptoms worsen or fail to improve.  Wilfred Lacy, NP

## 2019-06-14 NOTE — Patient Instructions (Signed)
If no improvement in 1-2weeks, schedule another appt with dermatology.  Avoid direct sunlight exposure

## 2019-07-08 ENCOUNTER — Other Ambulatory Visit: Payer: Self-pay | Admitting: Nurse Practitioner

## 2019-07-08 DIAGNOSIS — L309 Dermatitis, unspecified: Secondary | ICD-10-CM

## 2019-08-11 ENCOUNTER — Encounter: Payer: Self-pay | Admitting: Internal Medicine

## 2019-09-21 ENCOUNTER — Ambulatory Visit (AMBULATORY_SURGERY_CENTER): Payer: Self-pay | Admitting: *Deleted

## 2019-09-21 ENCOUNTER — Other Ambulatory Visit: Payer: Self-pay

## 2019-09-21 VITALS — Ht 74.0 in | Wt 239.0 lb

## 2019-09-21 DIAGNOSIS — Z86010 Personal history of colonic polyps: Secondary | ICD-10-CM

## 2019-09-21 MED ORDER — PLENVU 140 G PO SOLR: 1.0000 | ORAL | 0 refills | Status: DC

## 2019-09-21 NOTE — Progress Notes (Signed)
cov vax x 2  No egg or soy allergy known to patient  No issues with past sedation with any surgeries or procedures no intubation problems in the past  No FH of Malignant Hyperthermia No diet pills per patient No home 02 use per patient  No blood thinners per patient  Pt denies issues with constipation  No A fib or A flutter  EMMI video to pt or via MyChart  COVID 19 guidelines implemented in PV today with Pt and RN   Plenvu  Coupon given to pt in PV today , Code to Pharmacy   Due to the COVID-19 pandemic we are asking patients to follow these guidelines. Please only bring one care partner. Please be aware that your care partner may wait in the car in the parking lot or if they feel like they will be too hot to wait in the car, they may wait in the lobby on the 4th floor. All care partners are required to wear a mask the entire time (we do not have any that we can provide them), they need to practice social distancing, and we will do a Covid check for all patient's and care partners when you arrive. Also we will check their temperature and your temperature. If the care partner waits in their car they need to stay in the parking lot the entire time and we will call them on their cell phone when the patient is ready for discharge so they can bring the car to the front of the building. Also all patient's will need to wear a mask into building.  

## 2019-09-28 ENCOUNTER — Encounter: Payer: Self-pay | Admitting: Internal Medicine

## 2019-10-12 ENCOUNTER — Encounter: Payer: 59 | Admitting: Internal Medicine

## 2019-11-25 ENCOUNTER — Encounter: Payer: Self-pay | Admitting: Internal Medicine

## 2019-11-25 ENCOUNTER — Other Ambulatory Visit: Payer: Self-pay

## 2019-11-25 ENCOUNTER — Ambulatory Visit (INDEPENDENT_AMBULATORY_CARE_PROVIDER_SITE_OTHER): Payer: 59 | Admitting: Internal Medicine

## 2019-11-25 VITALS — BP 128/70 | HR 76 | Temp 99.0°F | Wt 239.4 lb

## 2019-11-25 DIAGNOSIS — Z23 Encounter for immunization: Secondary | ICD-10-CM | POA: Diagnosis not present

## 2019-11-25 DIAGNOSIS — T8459XD Infection and inflammatory reaction due to other internal joint prosthesis, subsequent encounter: Secondary | ICD-10-CM | POA: Diagnosis not present

## 2019-11-25 DIAGNOSIS — N401 Enlarged prostate with lower urinary tract symptoms: Secondary | ICD-10-CM

## 2019-11-25 DIAGNOSIS — Z96619 Presence of unspecified artificial shoulder joint: Secondary | ICD-10-CM | POA: Diagnosis not present

## 2019-11-25 DIAGNOSIS — R350 Frequency of micturition: Secondary | ICD-10-CM | POA: Diagnosis not present

## 2019-11-25 DIAGNOSIS — Z0000 Encounter for general adult medical examination without abnormal findings: Secondary | ICD-10-CM

## 2019-11-25 DIAGNOSIS — R030 Elevated blood-pressure reading, without diagnosis of hypertension: Secondary | ICD-10-CM

## 2019-11-25 LAB — LIPID PANEL
Cholesterol: 142 mg/dL (ref 0–200)
HDL: 43 mg/dL (ref >39.00)
LDL Cholesterol: 88 mg/dL (ref 0–99)
NonHDL: 98.91
Total CHOL/HDL Ratio: 3
Triglycerides: 55 mg/dL (ref 0.0–149.0)
VLDL: 11 mg/dL (ref 0.0–40.0)

## 2019-11-25 LAB — CBC WITH DIFFERENTIAL/PLATELET
Basophils Absolute: 0 10*3/uL (ref 0.0–0.1)
Basophils Relative: 0.3 % (ref 0.0–3.0)
Eosinophils Absolute: 0.3 10*3/uL (ref 0.0–0.7)
Eosinophils Relative: 3.3 % (ref 0.0–5.0)
HCT: 38.4 % — ABNORMAL LOW (ref 39.0–52.0)
Hemoglobin: 12.6 g/dL — ABNORMAL LOW (ref 13.0–17.0)
Lymphocytes Relative: 13.4 % (ref 12.0–46.0)
Lymphs Abs: 1.2 10*3/uL (ref 0.7–4.0)
MCHC: 32.7 g/dL (ref 30.0–36.0)
MCV: 81 fl (ref 78.0–100.0)
Monocytes Absolute: 0.8 10*3/uL (ref 0.1–1.0)
Monocytes Relative: 9.1 % (ref 3.0–12.0)
Neutro Abs: 6.6 10*3/uL (ref 1.4–7.7)
Neutrophils Relative %: 73.9 % (ref 43.0–77.0)
Platelets: 368 10*3/uL (ref 150.0–400.0)
RBC: 4.74 Mil/uL (ref 4.22–5.81)
RDW: 15.6 % — ABNORMAL HIGH (ref 11.5–15.5)
WBC: 9 10*3/uL (ref 4.0–10.5)

## 2019-11-25 LAB — COMPREHENSIVE METABOLIC PANEL
ALT: 15 U/L (ref 0–53)
AST: 16 U/L (ref 0–37)
Albumin: 3.6 g/dL (ref 3.5–5.2)
Alkaline Phosphatase: 69 U/L (ref 39–117)
BUN: 17 mg/dL (ref 6–23)
CO2: 28 mEq/L (ref 19–32)
Calcium: 8.3 mg/dL — ABNORMAL LOW (ref 8.4–10.5)
Chloride: 105 mEq/L (ref 96–112)
Creatinine, Ser: 0.93 mg/dL (ref 0.40–1.50)
GFR: 82.65 mL/min (ref >60.00)
Glucose, Bld: 84 mg/dL (ref 70–99)
Potassium: 4.5 mEq/L (ref 3.5–5.1)
Sodium: 140 mEq/L (ref 135–145)
Total Bilirubin: 0.7 mg/dL (ref 0.2–1.2)
Total Protein: 6.1 g/dL (ref 6.0–8.3)

## 2019-11-25 LAB — URINALYSIS
Bilirubin Urine: NEGATIVE
Hgb urine dipstick: NEGATIVE
Ketones, ur: NEGATIVE
Leukocytes,Ua: NEGATIVE
Nitrite: NEGATIVE
Specific Gravity, Urine: >=1.03 — AB (ref 1.000–1.030)
Total Protein, Urine: NEGATIVE
Urine Glucose: NEGATIVE
Urobilinogen, UA: 0.2 (ref 0.0–1.0)
pH: 5.5 (ref 5.0–8.0)

## 2019-11-25 LAB — PSA: PSA: 3.35 ng/mL (ref 0.10–4.00)

## 2019-11-25 LAB — C-REACTIVE PROTEIN: CRP: 12.7 mg/dL (ref 0.5–20.0)

## 2019-11-25 LAB — TSH: TSH: 0.47 u[IU]/mL (ref 0.35–4.50)

## 2019-11-25 LAB — SEDIMENTATION RATE: Sed Rate: 74 mm/hr — ABNORMAL HIGH (ref 0–20)

## 2019-11-25 MED ORDER — CEFADROXIL 500 MG PO CAPS: 500.0000 mg | ORAL_CAPSULE | Freq: Two times a day (BID) | ORAL | 1 refills | Status: DC

## 2019-11-25 NOTE — Assessment & Plan Note (Signed)
We discussed age appropriate health related issues, including available/recomended screening tests and vaccinations. We discussed a need for adhering to healthy diet and exercise. Labs ordered. All questions were answered. CT Ca score - will schedule again Pt had a chest CT at his sister's 2009 ---Ca score was 0.  Rect per Urology Derm q 12 mo

## 2019-11-25 NOTE — Patient Instructions (Signed)
Cologuard test

## 2019-11-25 NOTE — Progress Notes (Signed)
Subjective:  Patient ID: Chris Obrien, male    DOB: 12-21-1950  Age: 69 y.o. MRN: [id number]  CC: Annual Exam (Flu shot)   HPI Chris Obrien presents for a well exam  C/o URI symptoms Complaining of skin discoloration over left shoulder   Outpatient Medications Prior to Visit  Medication Sig Dispense Refill  . celecoxib (CELEBREX) 200 MG capsule Take 1 capsule (200 mg total) by mouth every 12 (twelve) hours. 180 capsule 3  . Glucosamine-Chondroit-Vit C-Mn (GLUCOSAMINE CHONDR 1500 COMPLX) CAPS Take 1 capsule by mouth 2 (two) times daily.     . Multiple Vitamin (MULTIVITAMIN WITH MINERALS) TABS Take 1 tablet by mouth every morning.     . triamcinolone cream (KENALOG) 0.1 % Apply topically 2 (two) times daily. Additional refills from pcp: Dr. [name] 45 g 0  . zolpidem (AMBIEN) 10 MG tablet Take 0.5-1 tablets (5-10 mg total) by mouth at bedtime as needed. Can use half tab if sufficient. 90 tablet 1  . aspirin EC 81 MG tablet Take 81 mg by mouth daily. (Patient not taking: Reported on 11/25/2019)    . cefadroxil (DURICEF) 500 MG capsule Take by mouth at bedtime. (Patient not taking: Reported on 11/25/2019)    . Cephalexin (KEFLEX PO) Take by mouth. (Patient not taking: Reported on 11/25/2019)    . folic acid (FOLVITE) 1 MG tablet Take 1 mg by mouth every morning.  (Patient not taking: Reported on 11/25/2019)    . PEG-KCl-NaCl-NaSulf-Na Asc-C (PLENVU) 140 g SOLR Take 1 kit by mouth as directed. Manufacturer's coupon Universal coupon code:BIN: [id number]; GROUP: [id number]; PCN: [id number]; ID: [id number]; PAY NO MORE $50 (Patient not taking: Reported on 11/25/2019) 1 each 0  . tamsulosin (FLOMAX) 0.4 MG CAPS capsule Take 0.4 mg by mouth. (Patient not taking: Reported on 09/21/2019)     No facility-administered medications prior to visit.    ROS: Review of Systems  Constitutional: Negative for appetite change, chills, diaphoresis, fatigue, fever and unexpected weight change.  HENT: Negative  for congestion, nosebleeds, sneezing, sore throat and trouble swallowing.   Eyes: Negative for itching and visual disturbance.  Respiratory: Negative for cough.   Cardiovascular: Negative for chest pain, palpitations and leg swelling.  Gastrointestinal: Negative for abdominal distention, blood in stool, diarrhea and nausea.  Genitourinary: Negative for frequency and hematuria.  Musculoskeletal: Positive for arthralgias. Negative for back pain, gait problem, joint swelling and neck pain.  Skin: Negative for rash.  Neurological: Negative for dizziness, tremors, speech difficulty and weakness.  Psychiatric/Behavioral: Negative for agitation, dysphoric mood and sleep disturbance. The patient is not nervous/anxious.     Objective:  BP 128/70 (BP Location: Left Arm)   Pulse 76   Temp 99 F (37.2 C) (Oral)   Wt 239 lb 6.4 oz (108.6 kg)   SpO2 97%   BMI 30.74 kg/m   BP Readings from Last 3 Encounters:  11/25/19 128/70  06/14/19 (!) 142/80  01/25/19 130/80    Wt Readings from Last 3 Encounters:  11/25/19 239 lb 6.4 oz (108.6 kg)  09/21/19 239 lb (108.4 kg)  06/14/19 241 lb 9.6 oz (109.6 kg)    Physical Exam Constitutional:      General: He is not in acute distress.    Appearance: He is well-developed.     Comments: NAD  Eyes:     Conjunctiva/sclera: Conjunctivae normal.     Pupils: Pupils are equal, round, and reactive to light.  Neck:     Thyroid: No thyromegaly.  Vascular: No JVD.  Cardiovascular:     Rate and Rhythm: Normal rate and regular rhythm.     Heart sounds: Normal heart sounds. No murmur heard.  No friction rub. No gallop.   Pulmonary:     Effort: Pulmonary effort is normal. No respiratory distress.     Breath sounds: Normal breath sounds. No wheezing or rales.  Chest:     Chest wall: No tenderness.  Abdominal:     General: Bowel sounds are normal. There is no distension.     Palpations: Abdomen is soft. There is no mass.     Tenderness: There is no  abdominal tenderness. There is no guarding or rebound.  Musculoskeletal:        General: No tenderness. Normal range of motion.     Cervical back: Normal range of motion.  Lymphadenopathy:     Cervical: No cervical adenopathy.  Skin:    General: Skin is warm and dry.     Findings: No rash.  Neurological:     Mental Status: He is alert and oriented to person, place, and time.     Cranial Nerves: No cranial nerve deficit.     Motor: No abnormal muscle tone.     Coordination: Coordination normal.     Gait: Gait normal.     Deep Tendon Reflexes: Reflexes are normal and symmetric.  Psychiatric:        Behavior: Behavior normal.        Thought Content: Thought content normal.        Judgment: Judgment normal.   Purplish patches over anterior left shoulder and pectoralis muscle, nontender  I spent 22 minutes in addition to time for CPX wellness examination in preparing to see the patient by review of recent labs, imaging and procedures, obtaining and reviewing separately obtained history, communicating with the patient, ordering medications, tests or procedures, and documenting clinical information in the EHR including the differential diagnosis, treatment, and any further evaluation and other management of  left shoulder discoloration, BPH        Lab Results  Component Value Date   WBC 6.2 02/04/2019   HGB 12.4 (L) 02/04/2019   HCT 37.9 (L) 02/04/2019   PLT 344.0 02/04/2019   GLUCOSE 96 11/24/2018   CHOL 186 11/24/2018   TRIG 78.0 11/24/2018   HDL 47.80 11/24/2018   LDLDIRECT 130.9 12/08/2012   LDLCALC 122 (H) 11/24/2018   ALT 22 11/24/2018   AST 17 11/24/2018   NA 138 11/24/2018   K 4.3 11/24/2018   CL 103 11/24/2018   CREATININE 1.00 11/24/2018   BUN 20 11/24/2018   CO2 27 11/24/2018   TSH 1.53 11/24/2018   PSA 4.80 (H) 11/24/2018   INR 1.09 06/03/2012   HGBA1C 5.4 12/21/2013    CT CARDIAC SCORING  Addendum Date: 12/23/2018   ADDENDUM REPORT: 12/23/2018 11:52  EXAM: OVER-READ INTERPRETATION  CT CHEST The following report is an over-read performed by radiologist Dr. Fonnie Birkenhead [facility] on 12/23/2018. This over-read does not include interpretation of cardiac or coronary anatomy or pathology. The interpretation by the cardiologist is attached. COMPARISON:  07/30/2007. FINDINGS: Ascending aorta measures 4.3 cm, slightly increased from 4.0 cm on 07/30/2007. Pulmonic trunk is enlarged. Tiny hiatal hernia. Linear scarring in the right lower lobe. Image quality is degraded by respiratory motion upon review of the lung windows. A few scattered pulmonary nodules measure up to 3 mm in the right lower lobe (3/47), likely present on 07/30/2007. Probable subpleural lymph  nodes along the fissures. No pleural fluid. Visualized upper abdomen and bones are unremarkable. IMPRESSION: 1. 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 Recommend annual imaging followup by CTA or MRA. This recommendation follows 2010 ACCF/AHA/AATS/ACR/ASA/SCA/SCAI/SIR/STS/SVM Guidelines for the Diagnosis and Management of Patients with Thoracic Aortic Disease. Circulation. 2010; 121: L572-I203. Aortic aneurysm NOS (ICD10-I71.9). 2. Enlarged pulmonic trunk, indicative of pulmonary arterial hypertension. Electronically Signed   By: Lorin Picket M.D.   On: 12/23/2018 11:52   Result Date: 12/23/2018 CLINICAL DATA:  Risk stratification EXAM: Coronary Calcium Score MEDICATIONS: None TECHNIQUE: The patient was scanned on a [employer]. Axial non-contrast 3 mm slices were carried out through the heart. The data set was analyzed on a dedicated work station and scored using the [place]. FINDINGS: Non-cardiac: See separate report from [facility]. Ascending Aorta: Normal size, no calcifications Pericardium: Normal. Coronary arteries: Normal origin. IMPRESSION: Coronary calcium score of 0. This was 0 percentile for age and sex  matched control. Electronically Signed: By: Ena Dawley On: 12/23/2018 09:54    Assessment & Plan:    Walker Kehr, MD

## 2019-11-26 ENCOUNTER — Other Ambulatory Visit: Payer: Self-pay | Admitting: Internal Medicine

## 2019-11-26 DIAGNOSIS — D649 Anemia, unspecified: Secondary | ICD-10-CM

## 2019-11-26 DIAGNOSIS — M25512 Pain in left shoulder: Secondary | ICD-10-CM

## 2019-11-29 NOTE — Assessment & Plan Note (Signed)
BP Readings from Last 3 Encounters:  11/25/19 128/70  06/14/19 (!) 142/80  01/25/19 130/80

## 2019-11-29 NOTE — Assessment & Plan Note (Signed)
Better following the prostate procedure

## 2019-11-29 NOTE — Assessment & Plan Note (Signed)
Obtain sed rate, CRP

## 2019-12-09 LAB — COLOGUARD
COLOGUARD: NEGATIVE
Cologuard: NEGATIVE

## 2019-12-09 NOTE — Telephone Encounter (Signed)
Checked the cologuard portal it states - ORDER IN PROGRESS. Sent a msg to verify status of report.Marland KitchenJohny Chess

## 2019-12-10 ENCOUNTER — Encounter: Payer: Self-pay | Admitting: Internal Medicine

## 2019-12-10 NOTE — Telephone Encounter (Signed)
Rec'd Cologuard results- NEGATIVE. Place results in chart.Marland KitchenJohny Obrien

## 2019-12-13 NOTE — Telephone Encounter (Signed)
Notified pt via mychart msg../lmb

## 2019-12-22 ENCOUNTER — Other Ambulatory Visit (INDEPENDENT_AMBULATORY_CARE_PROVIDER_SITE_OTHER): Payer: 59

## 2019-12-22 DIAGNOSIS — D649 Anemia, unspecified: Secondary | ICD-10-CM | POA: Diagnosis not present

## 2019-12-22 DIAGNOSIS — M25512 Pain in left shoulder: Secondary | ICD-10-CM

## 2019-12-22 LAB — CBC WITH DIFFERENTIAL/PLATELET
Basophils Absolute: 0 10*3/uL (ref 0.0–0.1)
Basophils Relative: 0.4 % (ref 0.0–3.0)
Eosinophils Absolute: 0.1 10*3/uL (ref 0.0–0.7)
Eosinophils Relative: 3.5 % (ref 0.0–5.0)
HCT: 41.4 % (ref 39.0–52.0)
Hemoglobin: 13.3 g/dL (ref 13.0–17.0)
Lymphocytes Relative: 22.3 % (ref 12.0–46.0)
Lymphs Abs: 1 10*3/uL (ref 0.7–4.0)
MCHC: 32.1 g/dL (ref 30.0–36.0)
MCV: 81.9 fl (ref 78.0–100.0)
Monocytes Absolute: 0.4 10*3/uL (ref 0.1–1.0)
Monocytes Relative: 8.8 % (ref 3.0–12.0)
Neutro Abs: 2.8 10*3/uL (ref 1.4–7.7)
Neutrophils Relative %: 65 % (ref 43.0–77.0)
Platelets: 353 10*3/uL (ref 150.0–400.0)
RBC: 5.06 Mil/uL (ref 4.22–5.81)
RDW: 15.4 % (ref 11.5–15.5)
WBC: 4.3 10*3/uL (ref 4.0–10.5)

## 2019-12-22 LAB — SEDIMENTATION RATE: Sed Rate: 45 mm/hr — ABNORMAL HIGH (ref 0–20)

## 2019-12-22 LAB — VITAMIN D 25 HYDROXY (VIT D DEFICIENCY, FRACTURES): VITD: 36.43 ng/mL (ref 30.00–100.00)

## 2019-12-22 LAB — C-REACTIVE PROTEIN: CRP: 8.2 mg/dL (ref 0.5–20.0)

## 2019-12-23 LAB — IRON,TIBC AND FERRITIN PANEL
%SAT: 9 % (calc) — ABNORMAL LOW (ref 20–48)
Ferritin: 101 ng/mL (ref 24–380)
Iron: 27 ug/dL — ABNORMAL LOW (ref 50–180)
TIBC: 300 mcg/dL (calc) (ref 250–425)

## 2019-12-23 LAB — PTH, INTACT AND CALCIUM
Calcium: 8.6 mg/dL (ref 8.6–10.3)
PTH: 60 pg/mL (ref 14–64)

## 2020-05-22 ENCOUNTER — Encounter: Payer: Self-pay | Admitting: Internal Medicine

## 2020-05-22 ENCOUNTER — Other Ambulatory Visit: Payer: Self-pay

## 2020-05-22 ENCOUNTER — Ambulatory Visit: Payer: 59 | Admitting: Internal Medicine

## 2020-05-22 VITALS — BP 130/70 | HR 82 | Temp 98.5°F | Wt 242.8 lb

## 2020-05-22 DIAGNOSIS — M25512 Pain in left shoulder: Secondary | ICD-10-CM

## 2020-05-22 DIAGNOSIS — M19012 Primary osteoarthritis, left shoulder: Secondary | ICD-10-CM | POA: Diagnosis not present

## 2020-05-22 DIAGNOSIS — R197 Diarrhea, unspecified: Secondary | ICD-10-CM | POA: Insufficient documentation

## 2020-05-22 DIAGNOSIS — G8929 Other chronic pain: Secondary | ICD-10-CM

## 2020-05-22 DIAGNOSIS — R21 Rash and other nonspecific skin eruption: Secondary | ICD-10-CM | POA: Diagnosis not present

## 2020-05-22 LAB — CBC WITH DIFFERENTIAL/PLATELET
Basophils Absolute: 0 10*3/uL (ref 0.0–0.1)
Basophils Relative: 0.3 % (ref 0.0–3.0)
Eosinophils Absolute: 0.1 10*3/uL (ref 0.0–0.7)
Eosinophils Relative: 1.9 % (ref 0.0–5.0)
HCT: 44.5 % (ref 39.0–52.0)
Hemoglobin: 14.6 g/dL (ref 13.0–17.0)
Lymphocytes Relative: 17.5 % (ref 12.0–46.0)
Lymphs Abs: 1.1 10*3/uL (ref 0.7–4.0)
MCHC: 32.9 g/dL (ref 30.0–36.0)
MCV: 83.7 fl (ref 78.0–100.0)
Monocytes Absolute: 0.6 10*3/uL (ref 0.1–1.0)
Monocytes Relative: 8.8 % (ref 3.0–12.0)
Neutro Abs: 4.6 10*3/uL (ref 1.4–7.7)
Neutrophils Relative %: 71.5 % (ref 43.0–77.0)
Platelets: 245 10*3/uL (ref 150.0–400.0)
RBC: 5.31 Mil/uL (ref 4.22–5.81)
RDW: 17.7 % — ABNORMAL HIGH (ref 11.5–15.5)
WBC: 6.4 10*3/uL (ref 4.0–10.5)

## 2020-05-22 LAB — COMPREHENSIVE METABOLIC PANEL
ALT: 14 U/L (ref 0–53)
AST: 14 U/L (ref 0–37)
Albumin: 4.1 g/dL (ref 3.5–5.2)
Alkaline Phosphatase: 73 U/L (ref 39–117)
BUN: 21 mg/dL (ref 6–23)
CO2: 28 mEq/L (ref 19–32)
Calcium: 8.8 mg/dL (ref 8.4–10.5)
Chloride: 102 mEq/L (ref 96–112)
Creatinine, Ser: 1.14 mg/dL (ref 0.40–1.50)
GFR: 64.51 mL/min (ref >60.00)
Glucose, Bld: 158 mg/dL — ABNORMAL HIGH (ref 70–99)
Potassium: 4.1 mEq/L (ref 3.5–5.1)
Sodium: 138 mEq/L (ref 135–145)
Total Bilirubin: 0.8 mg/dL (ref 0.2–1.2)
Total Protein: 6.4 g/dL (ref 6.0–8.3)

## 2020-05-22 LAB — SEDIMENTATION RATE: Sed Rate: 16 mm/hr (ref 0–20)

## 2020-05-22 LAB — C-REACTIVE PROTEIN: CRP: 8.2 mg/dL (ref 0.5–20.0)

## 2020-05-22 MED ORDER — CELECOXIB 200 MG PO CAPS: 200.0000 mg | ORAL_CAPSULE | Freq: Two times a day (BID) | ORAL | 3 refills | Status: DC

## 2020-05-22 MED ORDER — SACCHAROMYCES BOULARDII 250 MG PO CAPS: 250.0000 mg | ORAL_CAPSULE | Freq: Two times a day (BID) | ORAL | 1 refills | Status: DC

## 2020-05-22 MED ORDER — METRONIDAZOLE 500 MG PO TABS
500.0000 mg | ORAL_TABLET | Freq: Three times a day (TID) | ORAL | 0 refills | Status: DC
Start: 2020-05-22 — End: 2020-06-17

## 2020-05-22 MED ORDER — CEFADROXIL 500 MG PO CAPS: 500.0000 mg | ORAL_CAPSULE | Freq: Two times a day (BID) | ORAL | 1 refills | Status: DC

## 2020-05-22 NOTE — Progress Notes (Signed)
Subjective:  Patient ID: Chris Obrien, male    DOB: 08-07-1950  Age: 70 y.o. MRN: [id number]  CC: Diarrhea (Pt states he's been having recurring diarrhea for months )   HPI Chris Obrien presents for diarrhea - 5-6 a day, medium size. No abd pain. Imodium helps. C/o gas.  Outpatient Medications Prior to Visit  Medication Sig Dispense Refill  . Glucosamine-Chondroit-Vit C-Mn (GLUCOSAMINE CHONDR 1500 COMPLX) CAPS Take 1 capsule by mouth 2 (two) times daily.     . Multiple Vitamin (MULTIVITAMIN WITH MINERALS) TABS Take 1 tablet by mouth every morning.     . zolpidem (AMBIEN) 10 MG tablet Take 0.5-1 tablets (5-10 mg total) by mouth at bedtime as needed. Can use half tab if sufficient. 90 tablet 1  . cefadroxil (DURICEF) 500 MG capsule Take 1 capsule (500 mg total) by mouth 2 (two) times daily. 28 capsule 1  . celecoxib (CELEBREX) 200 MG capsule Take 1 capsule (200 mg total) by mouth every 12 (twelve) hours. 180 capsule 3  . triamcinolone cream (KENALOG) 0.1 % Apply topically 2 (two) times daily. Additional refills from pcp: Dr. [name] 45 g 0   No facility-administered medications prior to visit.    ROS: Review of Systems  Constitutional: Negative for appetite change, fatigue and unexpected weight change.  HENT: Negative for congestion, nosebleeds, sneezing, sore throat and trouble swallowing.   Eyes: Negative for itching and visual disturbance.  Respiratory: Negative for cough.   Cardiovascular: Negative for chest pain, palpitations and leg swelling.  Gastrointestinal: Positive for diarrhea. Negative for abdominal distention, abdominal pain, blood in stool, nausea and vomiting.  Genitourinary: Negative for frequency and hematuria.  Musculoskeletal: Negative for back pain, gait problem, joint swelling and neck pain.  Skin: Negative for rash.  Neurological: Negative for dizziness, tremors, speech difficulty and weakness.  Psychiatric/Behavioral: Negative for agitation, dysphoric  mood and sleep disturbance. The patient is not nervous/anxious.     Objective:  BP 130/70 (BP Location: Left Arm)   Pulse 82   Temp 98.5 F (36.9 C) (Oral)   Wt 242 lb 12.8 oz (110.1 kg)   SpO2 97%   BMI 31.17 kg/m   BP Readings from Last 3 Encounters:  05/22/20 130/70  11/25/19 128/70  06/14/19 (!) 142/80    Wt Readings from Last 3 Encounters:  05/22/20 242 lb 12.8 oz (110.1 kg)  11/25/19 239 lb 6.4 oz (108.6 kg)  09/21/19 239 lb (108.4 kg)    Physical Exam Constitutional:      General: He is not in acute distress.    Appearance: He is well-developed. He is obese.     Comments: NAD  Eyes:     Conjunctiva/sclera: Conjunctivae normal.     Pupils: Pupils are equal, round, and reactive to light.  Neck:     Thyroid: No thyromegaly.     Vascular: No JVD.  Cardiovascular:     Rate and Rhythm: Normal rate and regular rhythm.     Heart sounds: Normal heart sounds. No murmur heard. No friction rub. No gallop.   Pulmonary:     Effort: Pulmonary effort is normal. No respiratory distress.     Breath sounds: Normal breath sounds. No wheezing or rales.  Chest:     Chest wall: No tenderness.  Abdominal:     General: Bowel sounds are normal. There is no distension.     Palpations: Abdomen is soft. There is no mass.     Tenderness: There is no abdominal tenderness. There  is no guarding or rebound.  Musculoskeletal:        General: No tenderness. Normal range of motion.     Cervical back: Normal range of motion.  Lymphadenopathy:     Cervical: No cervical adenopathy.  Skin:    General: Skin is warm and dry.     Findings: No rash.  Neurological:     Mental Status: He is alert and oriented to person, place, and time.     Cranial Nerves: No cranial nerve deficit.     Motor: No abnormal muscle tone.     Coordination: Coordination normal.     Gait: Gait normal.     Deep Tendon Reflexes: Reflexes are normal and symmetric.  Psychiatric:        Behavior: Behavior normal.         Thought Content: Thought content normal.        Judgment: Judgment normal.     Large area of purple, nontender, normal skin temperature, blanching macular covering the left anterior medial pectoralis muscle and the parts of the scar on the left shoulder.    A total time of >45 minutes was spent preparing to see the patient, reviewing tests, x-rays, operative reports and outside records.  Also, obtaining history and performing comprehensive physical exam.  Additionally, counseling the patient regarding the above listed issues -diarrhea, skin rash, history of recurrent cellulitis and left shoulder wound infection.   Finally, documenting clinical information in the health records, coordination of care, educating the patient. It is a complex case.  Lab Results  Component Value Date   WBC 6.4 05/22/2020   HGB 14.6 05/22/2020   HCT 44.5 05/22/2020   PLT 245.0 05/22/2020   GLUCOSE 158 (H) 05/22/2020   CHOL 142 11/25/2019   TRIG 55.0 11/25/2019   HDL 43.00 11/25/2019   LDLDIRECT 130.9 12/08/2012   LDLCALC 88 11/25/2019   ALT 14 05/22/2020   AST 14 05/22/2020   NA 138 05/22/2020   K 4.1 05/22/2020   CL 102 05/22/2020   CREATININE 1.14 05/22/2020   BUN 21 05/22/2020   CO2 28 05/22/2020   TSH 0.47 11/25/2019   PSA 3.35 11/25/2019   INR 1.09 06/03/2012   HGBA1C 5.4 12/21/2013    CT CARDIAC SCORING  Addendum Date: 12/23/2018   ADDENDUM REPORT: 12/23/2018 11:52 EXAM: OVER-READ INTERPRETATION  CT CHEST The following report is an over-read performed by radiologist Dr. Fonnie Birkenhead [facility] on 12/23/2018. This over-read does not include interpretation of cardiac or coronary anatomy or pathology. The interpretation by the cardiologist is attached. COMPARISON:  07/30/2007. FINDINGS: Ascending aorta measures 4.3 cm, slightly increased from 4.0 cm on 07/30/2007. Pulmonic trunk is enlarged. Tiny hiatal hernia. Linear scarring in the right lower lobe. Image quality is degraded by  respiratory motion upon review of the lung windows. A few scattered pulmonary nodules measure up to 3 mm in the right lower lobe (3/47), likely present on 07/30/2007. Probable subpleural lymph nodes along the fissures. No pleural fluid. Visualized upper abdomen and bones are unremarkable. IMPRESSION: 1. 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 Recommend annual imaging followup by CTA or MRA. This recommendation follows 2010 ACCF/AHA/AATS/ACR/ASA/SCA/SCAI/SIR/STS/SVM Guidelines for the Diagnosis and Management of Patients with Thoracic Aortic Disease. Circulation. 2010; 121: J628-B151. Aortic aneurysm NOS (ICD10-I71.9). 2.   Enlarged pulmonic trunk, indicative of pulmonary arterial hypertension. Electronically Signed   By: Lorin Picket M.D.   On: 12/23/2018 11:52   Result Date: 12/23/2018 CLINICAL DATA:  Risk stratification EXAM: Coronary Calcium Score MEDICATIONS: None TECHNIQUE: The patient was scanned on a [employer]. Axial non-contrast 3 mm slices were carried out through the heart. The data set was analyzed on a dedicated work station and scored using the [place]. FINDINGS: Non-cardiac: See separate report from [facility]. Ascending Aorta: Normal size, no calcifications Pericardium: Normal. Coronary arteries: Normal origin. IMPRESSION: Coronary calcium score of 0. This was 0 percentile for age and sex matched control. Electronically Signed: By: Ena Dawley On: 12/23/2018 09:54    Assessment & Plan:    Walker Kehr, MD

## 2020-05-22 NOTE — Assessment & Plan Note (Addendum)
Worse.  Large area of purple, nontender, normal skin temperature, blanching macular covering the left anterior medial pectoralis muscle and the parts of the scar on the left shoulder. CRP, ESR, CBC The lesion has appeared at the time of postop infection in the left shoulder wound.  It is worse now.  We will obtain a follow-up ID consultation with Dr. Linus Obrien.  We will probably need to have Chris Obrien see a dermatologist too. Chris Obrien a history of several cellulitis episodes in this area.  It is possible that the changes are postinflammatory

## 2020-05-22 NOTE — Assessment & Plan Note (Addendum)
R/o C diff versus other etiology Stool tests -C. difficile test, lactoferrin, CMET. [name] is using the city water. Empiric Flagyl  (no alcohol while on Flagyl) Florastor po for 1 month Imodium as needed

## 2020-05-23 ENCOUNTER — Encounter: Payer: Self-pay | Admitting: Internal Medicine

## 2020-05-23 ENCOUNTER — Other Ambulatory Visit (INDEPENDENT_AMBULATORY_CARE_PROVIDER_SITE_OTHER): Payer: Medicare Other

## 2020-05-23 DIAGNOSIS — R7309 Other abnormal glucose: Secondary | ICD-10-CM | POA: Diagnosis not present

## 2020-05-23 LAB — HEMOGLOBIN A1C: Hgb A1c MFr Bld: 5.4 % (ref 4.6–6.5)

## 2020-06-06 ENCOUNTER — Ambulatory Visit: Payer: 59 | Admitting: Internal Medicine

## 2020-06-16 ENCOUNTER — Encounter: Payer: Self-pay | Admitting: Internal Medicine

## 2020-06-16 ENCOUNTER — Other Ambulatory Visit: Payer: Self-pay

## 2020-06-16 ENCOUNTER — Ambulatory Visit (INDEPENDENT_AMBULATORY_CARE_PROVIDER_SITE_OTHER): Payer: Medicare Other | Admitting: Internal Medicine

## 2020-06-16 VITALS — BP 153/94 | HR 82 | Temp 98.5°F | Wt 238.0 lb

## 2020-06-16 DIAGNOSIS — Z96619 Presence of unspecified artificial shoulder joint: Secondary | ICD-10-CM | POA: Diagnosis not present

## 2020-06-16 DIAGNOSIS — R21 Rash and other nonspecific skin eruption: Secondary | ICD-10-CM

## 2020-06-16 DIAGNOSIS — T8459XD Infection and inflammatory reaction due to other internal joint prosthesis, subsequent encounter: Secondary | ICD-10-CM

## 2020-06-16 NOTE — Patient Instructions (Signed)
Thank you for coming to see me today. It was a pleasure seeing you.  To Do: Continue watching off antibiotics since you are improving , inflammatory markers are normal, and no pain Follow up with [place] as needed if symptoms worsen Consider seeing an allergist.  I am not sure if you have an allergy to the implant or not.  If you have any questions or concerns, please do not hesitate to call the office at [phone].  Take Care,   Jule Ser, DO

## 2020-06-16 NOTE — Progress Notes (Signed)
[facility] for Infectious Disease  Reason for Consult:  Rash, PJI hx  Referring Provider: Dr Alain Obrien   HPI:    Chris Obrien is a 70 y.o. male with PMHx as below who presents to the clinic for rash.   Patient has a history of bilateral reverse shoulder arthroplasties with Chris Obrien in [place]) with history of infections in both shoulders.  More recently he had left shoulder revision in July 2020.  This was complicated by a superficial cellulitis treated with Keflex that he previously saw Chris Obrien in our clinic for.  He was most recently seen in Sept 2020.    He has been dealing with a rash over his left chest and surgical area for a while.  He saw his orthopedist back in March 2022 who noted the rash as well.  He had been taking cefadroxil occasionally but not recently at that time and so his orthopedist recommended resuming the cefadroxil twice daily.  At that time he denied any pain, fevers, chills.  His labs were notable for CRP normal and ESR 45 in December 2021.  Patient has now been off cefadroxil for approximately 60 days.  The rash today looks as good as it has in a long time and he recently saw a dermatologist who prescribed a steroid cream which has not been helpful per his report.  He continues to not have any pain or other infectious symptoms.  He does have concerns with how the rash looks from a cosmetic perspective but it is not interfering with his ability to do his day-to-day activities.  He had follow up with his PCP on 05/22/20 who noted a worsening rash with large, purplish, nontender area covering his left chest and parts of his surgical scar.  He did not have pain at that time either.  Labs that day notable for normal WBC, normal ESR, and normal CRP which would suggest against an infection involving his prosthetic joint. Pictures of the rash are viewable under the media tab.  He presents today and reports improvement in the rash.  As  noted, he has not been taking antibiotics that he has available as needed.  He continues to not have fevers, chills, pain, or other symptoms that would suggest infection other than the skin changes.   Patient's Medications  New Prescriptions   No medications on file  Previous Medications   CEFADROXIL (DURICEF) 500 MG CAPSULE    Take 1 capsule (500 mg total) by mouth 2 (two) times daily.   CELECOXIB (CELEBREX) 200 MG CAPSULE    Take 1 capsule (200 mg total) by mouth every 12 (twelve) hours.   GLUCOSAMINE-CHONDROIT-VIT C-MN (GLUCOSAMINE CHONDR 1500 COMPLX) CAPS    Take 1 capsule by mouth 2 (two) times daily.    METRONIDAZOLE (FLAGYL) 500 MG TABLET    Take 1 tablet (500 mg total) by mouth 3 (three) times daily.   MULTIPLE VITAMIN (MULTIVITAMIN WITH MINERALS) TABS    Take 1 tablet by mouth every morning.    SACCHAROMYCES BOULARDII (FLORASTOR) 250 MG CAPSULE    Take 1 capsule (250 mg total) by mouth 2 (two) times daily.   ZOLPIDEM (AMBIEN) 10 MG TABLET    Take 0.5-1 tablets (5-10 mg total) by mouth at bedtime as needed. Can use half tab if sufficient.  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   GERD (gastroesophageal reflux disease)    rare    Insomnia, unspecified    Oligospermia    OSA on CPAP    PT LOST  WEIGHT - NO LONGER USING CPAP   Pain    RIGHT SHOULDER DELTOID MUSCLE ATROPHY - LOSS OF STRENGT VERY LIMITED RANGE OF MOTTION   Right knee skin infection    Sleep apnea    weight loss so no cpap now    Swelling of joint of right knee    Urticaria     Social History   Tobacco Use    Smoking status: Former    Pack years: 0.00    Types: Cigars    Quit date: 1990    Years since quitting: 32.4   Smokeless tobacco: Never  Vaping Use   Vaping Use: Never used  Substance Use Topics   Alcohol use: Yes    Comment: occasionally   Drug use: No    Family History  Problem Relation Age of Onset   Alzheimer's disease Mother    Arthritis Father    Diabetes Brother    Hyperlipidemia Other    Colon cancer Neg Hx    Colon polyps Neg Hx    Esophageal cancer Neg Hx    Stomach cancer Neg Hx    Rectal cancer Neg Hx     Allergies  Allergen Reactions   Amoxicillin Hives   Vancomycin Rash    Severe redness all over body with swollen ankles and hands   Tramadol Itching    2019 Shoulder post-op   Oxycodone Rash   Penicillins Itching and Rash    Review of Systems  Constitutional:  Negative for chills and fever.  Respiratory: Negative.    Cardiovascular: Negative.   Musculoskeletal:  Negative for joint pain.  Skin:  Positive for rash. Negative for itching.     OBJECTIVE:    Vitals:   06/16/20 1400  BP: (!) 153/94  Pulse: 82  Temp: 98.5 F (36.9 C)  SpO2: 98%  Weight: 238 lb (108 kg)     Body mass index is 30.56 kg/m.  Physical Exam Constitutional:      General: He is not in acute distress.    Appearance: Normal appearance.  HENT:     Head: Normocephalic and atraumatic.  Pulmonary:     Effort: Pulmonary effort is normal. No respiratory distress.  Musculoskeletal:        General: No swelling or tenderness. Normal range of motion.  Skin:    Findings: Rash present.     Comments: Faint erythematous rash over left chest area.  No warmth or fluctuance.  No tenderness, non blanching.  Surgical incisions from prior joint replacement without drainage.  Neurological:     General: No focal deficit present.     Mental Status: He is alert and oriented to person, place, and time.  Psychiatric:        Mood and Affect: Mood normal.        Behavior: Behavior  normal.     Labs and Microbiology:  CBC Latest Ref Rng & Units 05/22/2020 12/22/2019 11/25/2019  WBC 4.0 - 10.5 K/uL 6.4 4.3  9.0  Hemoglobin 13.0 - 17.0 g/dL 14.6 13.3 12.6(L)  Hematocrit 39.0 - 52.0 % 44.5 41.4 38.4(L)  Platelets 150.0 - 400.0 K/uL 245.0 353.0 368.0   CMP Latest Ref Rng & Units 05/22/2020 12/22/2019 11/25/2019  Glucose 70 - 99 mg/dL 158(H) - 84  BUN 6 - 23 mg/dL 21 - 17  Creatinine 0.40 - 1.50 mg/dL 1.14 - 0.93  Sodium 135 - 145 mEq/L 138 - 140  Potassium 3.5 - 5.1 mEq/L 4.1 - 4.5  Chloride 96 - 112 mEq/L 102 - 105  CO2 19 - 32 mEq/L 28 - 28  Calcium 8.4 - 10.5 mg/dL 8.8 8.6 8.3(L)  Total Protein 6.0 - 8.3 g/dL 6.4 - 6.1  Total Bilirubin 0.2 - 1.2 mg/dL 0.8 - 0.7  Alkaline Phos 39 - 117 U/L 73 - 69  AST 0 - 37 U/L 14 - 16  ALT 0 - 53 U/L 14 - 15       ASSESSMENT & PLAN:    1. Rash 2. Infection of prosthetic shoulder joint  Chris Obrien is referred back to our clinic by his PCP, Chris Obrien, in the setting of bilateral shoulder arthroplasties with history of infection.  He most recently saw Chris Obrien in our clinic in Sept 2020 after developing some complications s/p left shoulder revision in July 2020.  Currently, does not appear to have an ongoing infection related to his left shoulder as he has no pain in the joint, no fevers, inflammatory markers are normal, and he has been off antibiotics for about 60 days per his report with noted improvement in his rash.  Perhaps this is some sort of post-inflammatory reaction or allergy to his implant, but I am unsure how this could be proven or disproven.  He asked regarding seeing an allergist and this may be of benefit to see if they have any specific thoughts or recommendations.  He has been using a steroid cream which the patient reports did not particularly help but his rash is better today than it has been in recent weeks/months.  I offered to see him back for routine follow up but for now he felt comfortable monitoring  on his own and letting us know if he needs to come back.  We decided to hold off resuming his antibiotics for now since infection seems less likely and he has cefadroxil available as needed if he were to develop new infectious symptoms.     Raynelle [facility] Infectious [facility] 06/17/2020, 6:30 AM   I spent 40 minutes dedicated to the care of this patient on the date of this encounter to include pre-visit review of records, face-to-face time with the patient discussing rash, history of shoulder implants/infection.

## 2020-10-04 ENCOUNTER — Other Ambulatory Visit: Payer: Self-pay | Admitting: Orthopedic Surgery

## 2020-10-10 ENCOUNTER — Other Ambulatory Visit: Payer: Self-pay | Admitting: Orthopedic Surgery

## 2020-10-10 DIAGNOSIS — M25511 Pain in right shoulder: Secondary | ICD-10-CM

## 2020-10-11 ENCOUNTER — Ambulatory Visit
Admission: RE | Admit: 2020-10-11 | Discharge: 2020-10-11 | Disposition: A | Discharge status: Home / Self Care | Payer: Medicare Other | Source: Ambulatory Visit | Attending: Orthopedic Surgery | Admitting: Orthopedic Surgery

## 2020-10-11 ENCOUNTER — Other Ambulatory Visit: Payer: Self-pay | Admitting: Orthopedic Surgery

## 2020-10-11 ENCOUNTER — Other Ambulatory Visit: Payer: Self-pay

## 2020-10-11 DIAGNOSIS — T8459XD Infection and inflammatory reaction due to other internal joint prosthesis, subsequent encounter: Secondary | ICD-10-CM

## 2020-10-11 DIAGNOSIS — Z96619 Presence of unspecified artificial shoulder joint: Secondary | ICD-10-CM

## 2020-10-11 DIAGNOSIS — M25511 Pain in right shoulder: Secondary | ICD-10-CM

## 2020-10-11 DIAGNOSIS — M25512 Pain in left shoulder: Secondary | ICD-10-CM

## 2020-10-16 LAB — CELL COUNT + DIFF, W/O CRYST-SYNVL FLD
Basophils, %: 0 %
Eosinophils-Synovial: 0 % (ref 0–2)
Lymphocytes-Synovial Fld: 80 % — ABNORMAL HIGH (ref 0–74)
Monocyte/Macrophage: 16 % (ref 0–69)
Neutrophil, Synovial: 4 % (ref 0–24)
Synoviocytes, %: 0 % (ref 0–15)
WBC, Synovial: 597 cells/uL — ABNORMAL HIGH (ref <150)

## 2020-10-16 LAB — ANAEROBIC AND AEROBIC CULTURE
AER RESULT:: NO GROWTH
AER RESULT:: NO GROWTH
MICRO NUMBER:: 12464073
MICRO NUMBER:: 12464074
MICRO NUMBER:: 12464076
MICRO NUMBER:: 12464077
SPECIMEN QUALITY:: ADEQUATE
SPECIMEN QUALITY:: ADEQUATE
SPECIMEN QUALITY:: ADEQUATE
SPECIMEN QUALITY:: ADEQUATE

## 2021-01-09 ENCOUNTER — Telehealth: Payer: Self-pay

## 2021-01-09 NOTE — Telephone Encounter (Signed)
Transition Care Management Follow-up Telephone Call Date of discharge and from where: 01/03/2021  [facility] in [place] How have you been since you were released from the hospital?doing well with less pain Any questions or concerns? No  Items Reviewed: Did the pt receive and understand the discharge instructions provided? Yes  Medications obtained and verified? Yes  Other? No  Any new allergies since your discharge? No  Dietary orders reviewed? No Do you have support at home? Yes   Home Care and Equipment/Supplies: Were home health services ordered? not applicable If so, what is the name of the agency?  Has the agency set up a time to come to the patient's home?  Were any new equipment or medical supplies ordered?  No What is the name of the medical supply agency?  Were you able to get the supplies/equipment? not applicable Do you have any questions related to the use of the equipment or supplies?   Functional Questionnaire: (I = Independent and D = Dependent) ADLs: with assistance from wife. Still in sling  Bathing/Dressing- with assistance from wife. Still in sling  Meal Prep- with assistance from wife. Still in sling  Eating- with assistance from wife. Still in sling  Maintaining continence- with assistance from wife. Still in sling  Transferring/Ambulation- with assistance from wife. Still in sling  Managing Meds- with assistance from wife. Still in sling  Follow up appointments reviewed:  PCP Hospital f/u appt confirmed? No   Specialist Hospital f/u appt confirmed? Yes  Scheduled to see ortho on 01/15/2021  Are transportation arrangements needed? No  If their condition worsens, is the pt aware to call PCP or go to the Emergency Dept.? Yes Was the patient provided with contact information for the PCP's office or ED? Yes Was to pt encouraged to call back with questions or concerns? Yes  Tomasa Rand, RN, BSN, CEN [facility] [employer] [phone]

## 2021-01-18 ENCOUNTER — Telehealth: Payer: Self-pay | Admitting: Internal Medicine

## 2021-01-18 DIAGNOSIS — R21 Rash and other nonspecific skin eruption: Secondary | ICD-10-CM

## 2021-01-18 DIAGNOSIS — M25512 Pain in left shoulder: Secondary | ICD-10-CM

## 2021-01-18 DIAGNOSIS — G8929 Other chronic pain: Secondary | ICD-10-CM

## 2021-01-18 DIAGNOSIS — D649 Anemia, unspecified: Secondary | ICD-10-CM

## 2021-01-18 NOTE — Telephone Encounter (Signed)
Patient states he broke his left arm last month and was advised by the hospital MD to have labs done  Patient is requesting a lab order for a cbc, esr, and crp  Offered patient next appt w/ provider for 1-24, patient states he will not be in town.  Please advise

## 2021-01-18 NOTE — Telephone Encounter (Signed)
Ok Done Thx 

## 2021-01-19 ENCOUNTER — Other Ambulatory Visit (INDEPENDENT_AMBULATORY_CARE_PROVIDER_SITE_OTHER): Payer: Medicare Other

## 2021-01-19 ENCOUNTER — Other Ambulatory Visit: Payer: Self-pay

## 2021-01-19 DIAGNOSIS — R21 Rash and other nonspecific skin eruption: Secondary | ICD-10-CM | POA: Diagnosis not present

## 2021-01-19 DIAGNOSIS — M25512 Pain in left shoulder: Secondary | ICD-10-CM | POA: Diagnosis not present

## 2021-01-19 DIAGNOSIS — G8929 Other chronic pain: Secondary | ICD-10-CM

## 2021-01-19 LAB — COMPREHENSIVE METABOLIC PANEL
ALT: 13 U/L (ref 0–53)
AST: 14 U/L (ref 0–37)
Albumin: 3.6 g/dL (ref 3.5–5.2)
Alkaline Phosphatase: 83 U/L (ref 39–117)
BUN: 17 mg/dL (ref 6–23)
CO2: 28 mEq/L (ref 19–32)
Calcium: 8.8 mg/dL (ref 8.4–10.5)
Chloride: 104 mEq/L (ref 96–112)
Creatinine, Ser: 0.88 mg/dL (ref 0.40–1.50)
GFR: 85.86 mL/min (ref >60.00)
Glucose, Bld: 88 mg/dL (ref 70–99)
Potassium: 4 mEq/L (ref 3.5–5.1)
Sodium: 140 mEq/L (ref 135–145)
Total Bilirubin: 0.5 mg/dL (ref 0.2–1.2)
Total Protein: 6.4 g/dL (ref 6.0–8.3)

## 2021-01-19 LAB — CBC WITH DIFFERENTIAL/PLATELET
Basophils Absolute: 0 10*3/uL (ref 0.0–0.1)
Basophils Relative: 0.8 % (ref 0.0–3.0)
Eosinophils Absolute: 0.7 10*3/uL (ref 0.0–0.7)
Eosinophils Relative: 10.9 % — ABNORMAL HIGH (ref 0.0–5.0)
HCT: 34.1 % — ABNORMAL LOW (ref 39.0–52.0)
Hemoglobin: 10.8 g/dL — ABNORMAL LOW (ref 13.0–17.0)
Lymphocytes Relative: 26.6 % (ref 12.0–46.0)
Lymphs Abs: 1.6 10*3/uL (ref 0.7–4.0)
MCHC: 31.7 g/dL (ref 30.0–36.0)
MCV: 81.1 fl (ref 78.0–100.0)
Monocytes Absolute: 0.6 10*3/uL (ref 0.1–1.0)
Monocytes Relative: 9.8 % (ref 3.0–12.0)
Neutro Abs: 3.2 10*3/uL (ref 1.4–7.7)
Neutrophils Relative %: 51.9 % (ref 43.0–77.0)
Platelets: 426 10*3/uL — ABNORMAL HIGH (ref 150.0–400.0)
RBC: 4.2 Mil/uL — ABNORMAL LOW (ref 4.22–5.81)
RDW: 15.3 % (ref 11.5–15.5)
WBC: 6.1 10*3/uL (ref 4.0–10.5)

## 2021-01-19 LAB — C-REACTIVE PROTEIN: CRP: 7.2 mg/dL (ref 0.5–20.0)

## 2021-01-19 LAB — SEDIMENTATION RATE: Sed Rate: 67 mm/hr — ABNORMAL HIGH (ref 0–20)

## 2021-01-19 NOTE — Telephone Encounter (Signed)
Notified pt w/MD response.../lmb 

## 2021-01-22 ENCOUNTER — Encounter: Payer: Self-pay | Admitting: Internal Medicine

## 2021-02-08 ENCOUNTER — Ambulatory Visit: Payer: Medicare Other | Admitting: Internal Medicine

## 2021-02-08 NOTE — Progress Notes (Signed)
Subjective:    Patient ID: Chris Obrien, male    DOB: 1950/04/30, 71 y.o.   MRN: [id number]  This visit occurred during the SARS-CoV-2 public health emergency.  Safety protocols were in place, including screening questions prior to the visit, additional usage of staff PPE, and extensive cleaning of exam room while observing appropriate contact time as indicated for disinfecting solutions.    HPI The patient is here for an acute visit.   Would like moles looked at - thought about seeing if sun helped redness. Has skin tags.   Redness left chest and right arm  - he had a left shoulder replacmeent 2 years ago and then right shoulder replacement and more recently broke his humerus and had surgery.   The redness does not hurt or itch.   Gets darker / lighter at times. He has tried several things that have not helped - ? Allergy to nickel or metal.     Medications and allergies reviewed with patient and updated if appropriate.  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   S/p left TKA 07/17/2015   Obstructive sleep apnea syndrome 04/28/2014   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   Benign paroxysmal positional vertigo 01/27/2014   Elevated blood pressure, situational 01/27/2014   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   Pain in joint, shoulder region 12/07/2012   S/P right TK  revision 06/15/2012   Cervical radiculopathy 05/07/2012   Normocytic anemia 04/28/2012   S/P Right knee removal of prosthesis with antibiotic spacer 04/27/2012   Rash 04/25/2012   Expected blood loss anemia 01/29/2012   Obesity (BMI 30.0-34.9) 01/29/2012   S/P right TKA 01/28/2012   Well adult exam 09/24/2011   Knee pain 09/24/2011   POLYP, COLON 05/30/2007   Insomnia 05/30/2007    Current Outpatient Medications on File Prior to Visit  Medication Sig Dispense Refill   cefadroxil (DURICEF) 500 MG capsule Take 1 capsule (500 mg total) by mouth 2 (two) times daily. 28 capsule 1   celecoxib (CELEBREX) 200 MG capsule Take 1 capsule (200 mg total) by mouth every 12 (twelve) hours. 180 capsule 3   Glucosamine-Chondroit-Vit C-Mn (GLUCOSAMINE CHONDR 1500 COMPLX) CAPS Take 1 capsule by mouth 2 (two) times daily.      Glucosamine-Chondroitin 500-400 MG CAPS Take by mouth.     Multiple Vitamin (MULTIVITAMIN WITH MINERALS) TABS Take 1 tablet by mouth every morning.      tamsulosin (FLOMAX) 0.4 MG CAPS capsule Take by mouth.     zolpidem (AMBIEN) 10 MG tablet Take 0.5-1 tablets (5-10 mg total) by mouth at bedtime as needed. Can use half tab if sufficient. 90 tablet 1   No current facility-administered medications on file prior to visit.    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   GERD (gastroesophageal reflux disease)    rare    Insomnia, unspecified    Oligospermia    OSA on CPAP    PT LOST  WEIGHT - NO LONGER USING CPAP   Pain    RIGHT SHOULDER DELTOID MUSCLE ATROPHY - LOSS OF STRENGT VERY LIMITED RANGE OF MOTTION   Right knee skin infection    Sleep apnea    weight loss so no cpap now     Swelling of joint of right knee    Urticaria     Past Surgical History:  Procedure Laterality Date   ADENOIDECTOMY     COLONOSCOPY     EXCISIONAL TOTAL KNEE ARTHROPLASTY WITH ANTIBIOTIC SPACERS Right 04/27/2012   Procedure: EXCISIONAL TOTAL KNEE ARTHROPLASTY WITH ANTIBIOTIC SPACERS;  Surgeon: Mauri Pole, MD;  Location: [facility];  Service: Orthopedics;  Laterality: Right;  SAME DAY LABS   IRRIGATION AND DEBRIDEMENT KNEE Right 04/02/2012   Procedure: IRRIGATION AND DEBRIDEMENT RIGHT PRE-PATELLA  ;  Surgeon: Mauri Pole, MD;  Location: [facility];  Service: Orthopedics;  Laterality: Right;   KNEE ARTHROSCOPY Right aug 2012   POLYPECTOMY     SINOSCOPY     TONSILLECTOMY     TOTAL KNEE ARTHROPLASTY  01/28/2012   Procedure: TOTAL KNEE ARTHROPLASTY;  Surgeon: Mauri Pole, MD;  Location: [facility];  Service: Orthopedics;  Laterality: Right;   TOTAL KNEE ARTHROPLASTY Left 07/17/2015   Procedure: LEFT TOTAL KNEE ARTHROPLASTY;  Surgeon: Paralee Cancel, MD;  Location: [facility];  Service: Orthopedics;  Laterality: Left;   TOTAL KNEE REVISION Right 06/15/2012   Procedure: reimplantation of right total knee ;  Surgeon: Mauri Pole, MD;  Location: [facility];  Service: Orthopedics;  Laterality: Right;   TOTAL SHOULDER REPLACEMENT Bilateral    done in [place] Right 2008    Social History   Socioeconomic History   Marital status: Married    Spouse name: Not on file   Number of children: Not on file   Years of education: 16   Highest education level: Not on file  Occupational History   Occupation: Freight forwarder    Comment: Nature conservation officer   Occupation: MANAGER    Employer: [employer]  Tobacco Use   Smoking status: Former    Types: Cigars    Quit date: [place]    Years since quitting: 33.1   Smokeless tobacco: Never  Vaping Use   Vaping Use: Never used  Substance and Sexual Activity   Alcohol use: Yes    Comment: occasionally   Drug use: No   Sexual  activity: Not on file  Other Topics Concern   Not on file  Social History Narrative   Caffeine 4 cups avg daily, 4 cans soda.   Social Determinants of Health   Financial Resource Strain: Not on file  Food Insecurity: Not on file  Transportation Needs: Not on file  Physical Activity: Not on file  Stress: Not on file  Social Connections: Not on file    Family History  Problem Relation Age of Onset   Alzheimer's disease Mother    Arthritis Father    Diabetes Brother    Hyperlipidemia Other    Colon cancer Neg Hx    Colon polyps Neg Hx    Esophageal cancer Neg  Hx    Stomach cancer Neg Hx    Rectal cancer Neg Hx     Review of Systems     Objective:   Vitals:   02/09/21 1423  BP: 140/88  Pulse: 80  Temp: 98.3 F (36.8 C)  SpO2: 97%   BP Readings from Last 3 Encounters:  02/09/21 140/88  06/16/20 (!) 153/94  05/22/20 130/70   Wt Readings from Last 3 Encounters:  02/09/21 248 lb (112.5 kg)  06/16/20 238 lb (108 kg)  05/22/20 242 lb 12.8 oz (110.1 kg)   Body mass index is 31.84 kg/m.   Physical Exam Constitutional:      General: He is not in acute distress.    Appearance: Normal appearance. He is not ill-appearing.  Skin:    General: Skin is warm and dry.     Findings: Erythema (patch of erythema entire circumference of right upper arm into scar on anterior shoulder where his scar is.  area of erythema from left anterior shoulder scar to left chest) present.     Comments: A few skin tags on back, normal appearing freckles with a few that had darker / variations in pigmentation that should be evaluated  Neurological:     Mental Status: He is alert.           Assessment & Plan:     Skin abnormality: Patches of erythema on right arm and left chest - ? Related to metal allergy from surgeries Will refer to allergy, also seeing derm   Screening for skin cancer, skin tags: Wants skin tags removed Should have skin cancer screening - h/o precancerous  lesion and has some freckles/moles that should be evaluated further but it is not urgent Referred to derm

## 2021-02-09 ENCOUNTER — Other Ambulatory Visit: Payer: Self-pay

## 2021-02-09 ENCOUNTER — Ambulatory Visit (INDEPENDENT_AMBULATORY_CARE_PROVIDER_SITE_OTHER): Payer: Medicare Other | Admitting: Internal Medicine

## 2021-02-09 ENCOUNTER — Encounter: Payer: Self-pay | Admitting: Internal Medicine

## 2021-02-09 VITALS — BP 140/88 | HR 80 | Temp 98.3°F | Ht 74.0 in | Wt 248.0 lb

## 2021-02-09 DIAGNOSIS — Z1283 Encounter for screening for malignant neoplasm of skin: Secondary | ICD-10-CM

## 2021-02-09 DIAGNOSIS — L989 Disorder of the skin and subcutaneous tissue, unspecified: Secondary | ICD-10-CM

## 2021-02-09 NOTE — Patient Instructions (Signed)
° ° ° °  A referral was ordered for dermatology, allergy       Someone from their offices will call you to schedule an appointment.

## 2021-03-27 NOTE — Progress Notes (Signed)
NEW PATIENT Date of Service/Encounter:  03/28/21 Referring provider: Binnie Rail, MD Primary care provider: Cassandria Anger, MD  Subjective:  Chris Obrien is a 71 y.o. male with a PMHx of brachial plexopathy s/p shoulder replacement, colon polyps, BPPV, OSA, BPHpulmoary hypertension, ascending aorta dilatation presenting today for evaluation of concern for nickel allergy.[name] History obtained from: chart review and patient.  Concern for metal allergy:  Patient has had multiple surgeries and replacements of joints including bilateral shoulders and knees.  Had joint failure and replacements in his shoulders.  He has a red rash over his shoulders and in front of his left chest over surgical sites.  This rash has been present following his surgeries.  It does not burn or itch.  It has not improved with topical steroids.  He is going to see a dermatologist in 2 weeks, and they have discussed potentially UV therapy versus laser therapy.   Multiple antibiotic reactions: Vancomycin-he has had a bad reaction to vancomycin.  One mouth after his first knee replacement, he got poison ivy which infected his knee.  He was put on a vancomycin PICC line.  He was on this medication for one month.  After about 5 weeks, the ball drained more rapidly than normal and he developed red man syndrome and all of his skin peeled and sloughed.  He was also hypotensive.   This occurred 6 years ago.  He has not used vancomycin.    Amoxacillin-he had a reaction to penicillin years ago.  The last time he had amoxicillin he started to get tight in his throat.  He took a Benadryl and was okay.  This was 6 years ago.  Chronic rhinitis: He continues to have chronic rhinitis.  He takes a Benadryl prior to going to his children's homes to avoid symptoms due to pet dander. He did do allergy shots for 25 years, stopped around 22 years ago.  Has had severe reactions around horse in the past.    PCP notes on 02/09/21:  redness over shoulder at place of shoulder surgery.  Does not itchy/hurt.  Gets darker/lighter.  Concern for possible metal allergy.   Review of surgical history: 01/02/21: periprosthetic fracture, concern for metal allergy He did have  left total shoulder arthroplasty with conversion to reverse TSA revision surgery in 07/28/18 for mechanical failure.  03/24/18: R shoulder antibiotic spacer removal and revision reverse TSA 01/06/18: R should abscess at deltopectoral scar-PICC line placement Replacement surgery left shoulder on 12/24/17. Replacement surgery (R reverse TSA) on 05/13/17.  Of note, he is a former patient of Dr. Neldon Mc last seen in 2019.  He was evaluated at time for vancomycin amoxicillin allergy and told to avoid.  He was also advised to pretreat with antihistamines and use hydrocodone for pain therapy for his upcoming shoulder surgery at that time.   Other allergy screening: Asthma: no Food allergy: no Eczema:no   Past Medical History: Past Medical History:  Diagnosis Date   Acute reactive arthritis ([place]) per pcp note from 03-30-2012   right pre-patella---  secondary contact dermitis with poison ivy exposure 03-26-2012   Angio-edema    Arthritis    Benign neoplasm of colon    BPH (benign prostatic hypertrophy)    hx small stream   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   GERD (gastroesophageal reflux disease)    rare    Insomnia, unspecified    Oligospermia    OSA on CPAP    PT LOST  WEIGHT - NO  LONGER USING CPAP   Pain    RIGHT SHOULDER DELTOID MUSCLE ATROPHY - LOSS OF STRENGT VERY LIMITED RANGE OF MOTTION   Right knee skin infection    Sleep apnea    weight loss so no cpap now    Swelling of joint of right knee    Urticaria    Medication List:  Current Outpatient Medications  Medication Sig Dispense Refill   amoxicillin (AMOXIL) 400 MG/5ML suspension DO NOT TAKE.  Bring to your allergy appointment. 50 mL 0   cefadroxil (DURICEF) 500 MG capsule Take 1 capsule  (500 mg total) by mouth 2 (two) times daily. 28 capsule 1   celecoxib (CELEBREX) 200 MG capsule Take 1 capsule (200 mg total) by mouth every 12 (twelve) hours. 180 capsule 3   Glucosamine-Chondroit-Vit C-Mn (GLUCOSAMINE CHONDR 1500 COMPLX) CAPS Take 1 capsule by mouth 2 (two) times daily.      Glucosamine-Chondroitin 500-400 MG CAPS Take by mouth.     Multiple Vitamin (MULTIVITAMIN WITH MINERALS) TABS Take 1 tablet by mouth every morning.      tamsulosin (FLOMAX) 0.4 MG CAPS capsule Take by mouth.     No current facility-administered medications for this visit.   Known Allergies:  Allergies  Allergen Reactions   Amoxicillin Hives   Vancomycin Rash    Severe redness all over body with swollen ankles and hands   Tramadol Itching    2019 Shoulder post-op   Oxycodone Rash   Penicillins Itching and Rash   Past Surgical History: Past Surgical History:  Procedure Laterality Date   ADENOIDECTOMY     COLONOSCOPY     EXCISIONAL TOTAL KNEE ARTHROPLASTY WITH ANTIBIOTIC SPACERS Right 04/27/2012   Procedure: EXCISIONAL TOTAL KNEE ARTHROPLASTY WITH ANTIBIOTIC SPACERS;  Surgeon: Mauri Pole, MD;  Location: [facility];  Service: Orthopedics;  Laterality: Right;  SAME DAY LABS   IRRIGATION AND DEBRIDEMENT KNEE Right 04/02/2012   Procedure: IRRIGATION AND DEBRIDEMENT RIGHT PRE-PATELLA  ;  Surgeon: Mauri Pole, MD;  Location: [place];  Service: Orthopedics;  Laterality: Right;   KNEE ARTHROSCOPY Right aug 2012   POLYPECTOMY     SINOSCOPY     TONSILLECTOMY     TOTAL KNEE ARTHROPLASTY  01/28/2012   Procedure: TOTAL KNEE ARTHROPLASTY;  Surgeon: Mauri Pole, MD;  Location: [facility];  Service: Orthopedics;  Laterality: Right;   TOTAL KNEE ARTHROPLASTY Left 07/17/2015   Procedure: LEFT TOTAL KNEE ARTHROPLASTY;  Surgeon: Paralee Cancel, MD;  Location: [facility];  Service: Orthopedics;  Laterality: Left;   TOTAL KNEE REVISION Right 06/15/2012   Procedure: reimplantation of right total knee ;   Surgeon: Mauri Pole, MD;  Location: [facility];  Service: Orthopedics;  Laterality: Right;   TOTAL SHOULDER REPLACEMENT Bilateral    done in [place]    ULNAR NERVE REPAIR Right 2008   Family History: Family History  Problem Relation Age of Onset   Alzheimer's disease Mother    Arthritis Father    Diabetes Brother    Hyperlipidemia Other    Colon cancer Neg Hx    Colon polyps Neg Hx    Esophageal cancer Neg Hx    Stomach cancer Neg Hx    Rectal cancer Neg Hx    Social History: Geoff is married, splits time between here and at home in [place].  He has children.[name]  He is a former smoker who quit in 1990.  He previously smoked cigars.  ROS:  All other systems negative except as  noted per HPI.  Objective:  Blood pressure 140/80, pulse 84, temperature 97.7 F (36.5 C), temperature source Temporal, resp. rate 18, height '6\' 1"'$  (1.854 m), weight 248 lb 12.8 oz (112.9 kg), SpO2 95 %. Body mass index is 32.83 kg/m. Physical Exam:  General Appearance:  Alert, cooperative, no distress, appears stated age  Head:  Normocephalic, without obvious abnormality, atraumatic  Eyes:  Conjunctiva clear, EOM's intact  Nose: Nares normal, hypertrophic turbinates, normal mucosa, and no visible anterior polyps  Throat: Lips, tongue normal; teeth and gums normal, normal posterior oropharynx  Neck: Supple, symmetrical  Lungs:   clear to auscultation bilaterally, Respirations unlabored, no coughing  Heart:  regular rate and rhythm and no murmur, Appears well perfused  Extremities: No edema  Skin: Well-healing surgical scars on bilateral shoulders.  Erythroderma over left anterior chest and right shoulder and upper arm, no excoriations  Neurologic: No gross deficits     Diagnostics: Spirometry:  Skin Testing: Environmental allergy panel.  Adequate controls. Results discussed with patient/family.  Airborne Adult Perc - 03/28/21 0917     Time Antigen Placed 1287    Allergen Manufacturer Lavella Hammock     Location Back    Number of Test 59    1. Control-Buffer 50% Glycerol Negative    2. Control-Histamine 1 mg/ml 3+    3. Albumin saline Negative    4. [place] Negative    5. [place] Negative    6. Johnson Negative    7. Lakeridge Blue Negative    8. Meadow Fescue Negative    9. Perennial Rye Negative    10. Sweet Vernal Negative    11. Timothy Negative    12. Cocklebur Negative    13. Burweed Marshelder Negative    14. Ragweed, short Negative    15. Ragweed, Giant Negative    16. Plantain,  English Negative    17. Lamb's Quarters Negative    18. Sheep Sorrell Negative    19. Rough Pigweed Negative    20. Marsh Elder, Rough Negative    21. Mugwort, Common Negative    22. Ash mix Negative    23. Birch mix Negative    24. Beech American Negative    25. Box, Elder Negative    26. Cedar, red Negative    27. Cottonwood, [place] Negative    28. Elm mix Negative    29. Hickory Negative    30. Maple mix Negative    31. Oak, [place] mix Negative    32. Pecan Pollen Negative    33. Pine mix Negative    34. Sycamore Eastern Negative    35. [place], Black Pollen Negative    36. Alternaria alternata Negative    37. Cladosporium Herbarum 2+    38. Aspergillus mix 3+    39. Penicillium mix Negative    40. Bipolaris sorokiniana (Helminthosporium) Negative    41. Drechslera spicifera (Curvularia) Negative    42. Mucor plumbeus Negative    43. Fusarium moniliforme Negative    44. Aureobasidium pullulans (pullulara) Negative    45. Rhizopus oryzae Negative    46. Botrytis cinera Negative    47. Epicoccum nigrum Negative    48. Phoma betae Negative    49. Candida Albicans Negative    50. Trichophyton mentagrophytes Negative    51. Mite, D Farinae  5,000 AU/ml Negative    52. Mite, D Pteronyssinus  5,000 AU/ml Negative    53. Cat Hair 10,000 BAU/ml 4+    54.  Dog Epithelia  3+    55. Mixed Feathers Negative    56. Horse Epithelia Negative    57. Cockroach, German Negative    58. Mouse  Negative    59. Tobacco [name] Negative             Allergy testing results were read and interpreted by myself, documented by clinical staff.  Assessment and Plan  For rash and multiple joint failures-this is highly concerning for contact dermatitis to metals.  He is not interested in undergoing any additional surgeries.  However, discussed the value of undergoing this testing in the event that he needs another joint replacement in the future.  Regarding antibiotic allergies-his reaction to vancomycin is highly concerning for mixed type I and delayed type IV reaction in which case he should strictly avoid.  There is no testing for this.  While his reaction to amoxicillin is concerning, it has been greater than 5 years.  It is possible he has outgrown this allergy, so we will undergo skin testing followed by intradermal testing followed by amoxicillin oral challenge if indicated.  Regarding his chronic rhinitis, history and testing today consistent with pet dander allergy as well as indoor and outdoor mold allergy.  H/O Penicillin allergy: - please schedule follow-up appt at your convenience for penicillin testing followed by graded oral challenge if indicated - please refrain from taking any antihistamines at least 3 days prior to this appointment  - around 80% of individuals outgrow this allergy in ~ 10 years and carrying it as a diagnosis can prevent you from getting proper therapy if needed - amoxicillin prescription sent to your pharmacy, please pick this up and bring with you to your amoxicillin challenge.  Do not take prior to your appointment.  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 - ABSOLUTE CONTRAINDICATION - strict avoidance indicated  Concern for metal allergy:  - please schedule a patch testing appointment (Mon-Wed-Fri) - will need to be off all topical steroids for 2 weeks prior to this appointment, and any oral or injections of steroid for 4 weeks  prior to appointment - for rash, agree with exploring treatment options with Dermatology   Chronic Rhinitis -perennial allergic: - allergy testing today was indoor and outdoor molds, cat and dog - allergen avoidance as below - consider allergy shots as long term control of your symptoms by teaching your immune system to be more tolerant of your allergy triggers - Continue over the counter antihistamine daily or daily as needed.   -Your options include Zyrtec (Cetirizine) '10mg'$ , Claritin (Loratadine) '10mg'$ , Allegra (Fexofenadine) '180mg'$ , or Xyzal (Levocetirinze) '5mg'$  Use A NON_SEDATING antihistamine  Follow-up for METAL patch testing and amoxicillin testing (separate appointments)  It was a pleasure meeting you today.  This note in its entirety was forwarded to the Provider who requested this consultation.  Thank you for your kind referral. I appreciate the opportunity to take part in Kristoph's care. Please do not hesitate to contact me with questions.  Sincerely,  Sigurd Sos, MD [facility]

## 2021-03-28 ENCOUNTER — Encounter: Payer: Self-pay | Admitting: Internal Medicine

## 2021-03-28 ENCOUNTER — Ambulatory Visit: Payer: Medicare Other | Admitting: Internal Medicine

## 2021-03-28 ENCOUNTER — Other Ambulatory Visit: Payer: Self-pay

## 2021-03-28 VITALS — BP 140/80 | HR 84 | Temp 97.7°F | Resp 18 | Ht 73.0 in | Wt 248.8 lb

## 2021-03-28 DIAGNOSIS — L230 Allergic contact dermatitis due to metals: Secondary | ICD-10-CM | POA: Diagnosis not present

## 2021-03-28 DIAGNOSIS — J310 Chronic rhinitis: Secondary | ICD-10-CM

## 2021-03-28 DIAGNOSIS — Z880 Allergy status to penicillin: Secondary | ICD-10-CM

## 2021-03-28 DIAGNOSIS — Z889 Allergy status to unspecified drugs, medicaments and biological substances status: Secondary | ICD-10-CM

## 2021-03-28 DIAGNOSIS — J3089 Other allergic rhinitis: Secondary | ICD-10-CM | POA: Insufficient documentation

## 2021-03-28 MED ORDER — AMOXICILLIN 400 MG/5ML PO SUSR: ORAL | 0 refills | Status: DC

## 2021-03-28 NOTE — Patient Instructions (Addendum)
H/O Penicillin allergy: ?- please schedule follow-up appt at your convenience for penicillin testing followed by graded oral challenge if indicated ?- please refrain from taking any antihistamines at least 3 days prior to this appointment  ?- around 80% of individuals outgrow this allergy in ~ 10 years and carrying it as a diagnosis can prevent you from getting proper therapy if needed ?- amoxicillin prescription sent to your pharmacy, please pick this up and bring with you to your amoxicillin challenge.  Do not take prior to your appointment. ? 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 ?Concern for metal allergy:  ?- please schedule a patch testing appointment (Mon-Wed-Fri) ?- will need to be off all topical steroids for 2 weeks prior to this appointment, and any oral or injections of steroid for 4 weeks prior to appointment ?- for rash, agree with exploring treatment options with Dermatology  ? ?Chronic Rhinitis -perennial allergic: ?- allergy testing today was indoor and outdoor molds, cat and dog ?- allergen avoidance as below ?- consider allergy shots as long term control of your symptoms by teaching your immune system to be more tolerant of your allergy triggers ?- Continue over the counter antihistamine daily or daily as needed.   ?-Your options include Zyrtec (Cetirizine) '10mg'$ , Claritin (Loratadine) '10mg'$ , Allegra (Fexofenadine) '180mg'$ , or Xyzal (Levocetirinze) '5mg'$  ?Use A NON_SEDATING antihistamine ? ?Follow-up for  METAL patch testing and amoxicillin testing (separate appointments) ? ?It was a pleasure meeting you today. ? ?Control of Dog or Cat Allergen ? ?Avoidance is the best way to manage a dog or cat allergy. If you have a dog or cat and are allergic to dog or cats, consider removing the dog or cat from the home. ?If you have a dog or cat but don?t want to find it a new home, or if your family wants a pet even though someone  in the household is allergic, here are some strategies that may help keep symptoms at bay: ? ?Keep the pet out of your bedroom and restrict it to only a few rooms. Be advised that keeping the dog or cat in only one room will not limit the allergens to that room. ?Don?t pet, hug or kiss the dog or cat; if you do, wash your hands with soap and water. ?High-efficiency particulate air (HEPA) cleaners run continuously in a bedroom or living room can reduce allergen levels over time. ?Regular use of a high-efficiency vacuum cleaner or a central vacuum can reduce allergen levels. ?Giving your dog or cat a bath at least once a week can reduce airborne allergen. ? ?Control of Mold Allergen  ? ?Mold and fungi can grow on a variety of surfaces provided certain temperature and moisture conditions exist.  Outdoor molds grow on plants, decaying vegetation and soil.  The major outdoor mold, Alternaria and Cladosporium, are found in very high numbers during hot and dry conditions.  Generally, a late Summer - Fall peak is seen for common outdoor fungal spores.  Rain will temporarily lower outdoor mold spore count, but counts rise rapidly when the rainy period ends.  The most important indoor molds are Aspergillus and Penicillium.  Dark, humid and poorly ventilated basements are ideal sites for mold growth.  The next most common sites of mold growth are the bathroom and the kitchen. ? ?Outdoor (Seasonal) Mold Control ? ?Use air conditioning and keep windows closed ?Avoid exposure to decaying vegetation. ?Avoid leaf raking. ?Avoid grain handling. ?Consider  wearing a face mask if working in moldy areas.  ? ? ?Indoor (Perennial) Mold Control  ? ?Maintain humidity below 50%. ?Clean washable surfaces with 5% bleach solution. ?Remove sources e.g. contaminated carpets. ? ? ? ? ?

## 2021-03-30 ENCOUNTER — Other Ambulatory Visit: Payer: Self-pay | Admitting: Internal Medicine

## 2021-03-30 DIAGNOSIS — D649 Anemia, unspecified: Secondary | ICD-10-CM

## 2021-03-30 DIAGNOSIS — R21 Rash and other nonspecific skin eruption: Secondary | ICD-10-CM

## 2021-03-30 NOTE — Progress Notes (Signed)
Labs

## 2021-04-02 ENCOUNTER — Other Ambulatory Visit: Payer: Self-pay

## 2021-04-02 ENCOUNTER — Other Ambulatory Visit (INDEPENDENT_AMBULATORY_CARE_PROVIDER_SITE_OTHER): Payer: Medicare Other

## 2021-04-02 DIAGNOSIS — D649 Anemia, unspecified: Secondary | ICD-10-CM

## 2021-04-02 DIAGNOSIS — R21 Rash and other nonspecific skin eruption: Secondary | ICD-10-CM | POA: Diagnosis not present

## 2021-04-02 LAB — CBC WITH DIFFERENTIAL/PLATELET
Basophils Absolute: 0 10*3/uL (ref 0.0–0.1)
Basophils Relative: 0.6 % (ref 0.0–3.0)
Eosinophils Absolute: 0.5 10*3/uL (ref 0.0–0.7)
Eosinophils Relative: 7.6 % — ABNORMAL HIGH (ref 0.0–5.0)
HCT: 43 % (ref 39.0–52.0)
Hemoglobin: 13.7 g/dL (ref 13.0–17.0)
Lymphocytes Relative: 29 % (ref 12.0–46.0)
Lymphs Abs: 2 10*3/uL (ref 0.7–4.0)
MCHC: 31.8 g/dL (ref 30.0–36.0)
MCV: 80.6 fl (ref 78.0–100.0)
Monocytes Absolute: 0.5 10*3/uL (ref 0.1–1.0)
Monocytes Relative: 7.6 % (ref 3.0–12.0)
Neutro Abs: 3.9 10*3/uL (ref 1.4–7.7)
Neutrophils Relative %: 55.2 % (ref 43.0–77.0)
Platelets: 295 10*3/uL (ref 150.0–400.0)
RBC: 5.33 Mil/uL (ref 4.22–5.81)
RDW: 17.3 % — ABNORMAL HIGH (ref 11.5–15.5)
WBC: 7 10*3/uL (ref 4.0–10.5)

## 2021-04-02 LAB — COMPREHENSIVE METABOLIC PANEL
ALT: 14 U/L (ref 0–53)
AST: 20 U/L (ref 0–37)
Albumin: 4.1 g/dL (ref 3.5–5.2)
Alkaline Phosphatase: 85 U/L (ref 39–117)
BUN: 19 mg/dL (ref 6–23)
CO2: 26 mEq/L (ref 19–32)
Calcium: 9.2 mg/dL (ref 8.4–10.5)
Chloride: 105 mEq/L (ref 96–112)
Creatinine, Ser: 0.94 mg/dL (ref 0.40–1.50)
GFR: 80.83 mL/min (ref >60.00)
Glucose, Bld: 126 mg/dL — ABNORMAL HIGH (ref 70–99)
Potassium: 4.4 mEq/L (ref 3.5–5.1)
Sodium: 139 mEq/L (ref 135–145)
Total Bilirubin: 0.6 mg/dL (ref 0.2–1.2)
Total Protein: 6.7 g/dL (ref 6.0–8.3)

## 2021-04-02 LAB — C-REACTIVE PROTEIN: CRP: 2.3 mg/dL (ref 0.5–20.0)

## 2021-04-02 LAB — SEDIMENTATION RATE: Sed Rate: 18 mm/hr (ref 0–20)

## 2021-04-03 LAB — IRON,TIBC AND FERRITIN PANEL
%SAT: 16 % (calc) — ABNORMAL LOW (ref 20–48)
Ferritin: 36 ng/mL (ref 24–380)
Iron: 59 ug/dL (ref 50–180)
TIBC: 373 mcg/dL (calc) (ref 250–425)

## 2021-04-18 ENCOUNTER — Encounter: Payer: Self-pay | Admitting: Family

## 2021-04-18 ENCOUNTER — Ambulatory Visit (INDEPENDENT_AMBULATORY_CARE_PROVIDER_SITE_OTHER): Payer: Medicare Other | Admitting: Family

## 2021-04-18 VITALS — BP 128/82 | HR 69 | Temp 98.3°F | Resp 18 | Ht 73.0 in | Wt 247.2 lb

## 2021-04-18 DIAGNOSIS — Z889 Allergy status to unspecified drugs, medicaments and biological substances status: Secondary | ICD-10-CM

## 2021-04-18 DIAGNOSIS — Z880 Allergy status to penicillin: Secondary | ICD-10-CM

## 2021-04-18 NOTE — Progress Notes (Signed)
? ?[place] [id number] ?Dept: [phone] ? ?FOLLOW UP NOTE ? ?Patient ID: Chris Obrien, male    DOB: January 01, 1951  Age: 71 y.o. MRN: [id number] ?Date of Office Visit: 04/18/2021 ? ?Assessment  ?Chief Complaint: Food/Drug Challenge ? ?HPI ?Chris Obrien is a 71 year old male who presents today for skin testing to penicillin and possible oral challenge to amoxicillin.  He was last seen on March 28, 2021 by Dr. Simona Huh for history of penicillin allergy, history of vancomycin allergy with skin sloughing and hypotension, concern for metal allergy, and perennial allergic rhinitis.  At his last office visit he denies any new diagnoses or surgeries. ? ?He reports that approximately 6 years ago after surgery he was given amoxicillin and started to get tightness in his throat and a rash on both arms.  He took Benadryl and the symptoms went right away.  Today he reports that he is in good health and denies any cardiorespiratory, gastrointestinal, and cutaneous symptoms.  He has been off all antihistamines for the past 3 days.  All questions answered and informed consent signed.  He did not realize that he needed to pick up the amoxicillin 400 mg per 5 mL's that was sent in by Dr. Simona Huh at his last office visit.  He asked if there is any kind of testing for vancomycin.  Instructed him that due to his previous reaction with vancomycin he needs to continue strict avoidance. ? ? ?Drug Allergies:  ?Allergies  ?Allergen Reactions  ? Amoxicillin Hives  ? Vancomycin Rash  ?  Severe redness all over body with swollen ankles and hands  ? Tramadol Itching  ?  2019 Shoulder post-op  ? Oxycodone Rash  ? Penicillins Itching and Rash  ? ? ?Review of Systems: ?Review of Systems  ?Constitutional:  Negative for chills and fever.  ?HENT:    ?     Denies rhinorrhea, nasal congestion, and postnasal drip  ?Eyes:   ?     Denies itchy watery eyes  ?Respiratory:  Negative for cough, shortness of breath and wheezing.    ?Cardiovascular:  Negative for chest pain and palpitations.  ?Gastrointestinal:  Negative for abdominal pain, diarrhea, nausea and vomiting.  ?Skin:  Negative for itching and rash.  ?Neurological:  Negative for headaches.  ?Endo/Heme/Allergies:  Positive for environmental allergies.  ? ? ?Physical Exam: ?BP 128/82   Pulse 69   Temp 98.3 ?F (36.8 ?C)   Resp 18   Ht '6\' 1"'$  (1.854 m)   Wt 247 lb 4 oz (112.2 kg)   SpO2 95%   BMI 32.62 kg/m?   ? ?Physical Exam ?Constitutional:   ?   Appearance: Normal appearance.  ?HENT:  ?   Head: Normocephalic and atraumatic.  ?   Comments: Pharynx normal, nose normal, ears normal, nose: Bilateral lower turbinates mildly edematous and slightly erythematous with no drainage noted ?   Right Ear: Tympanic membrane, ear canal and external ear normal.  ?   Left Ear: Tympanic membrane, ear canal and external ear normal.  ?   Mouth/Throat:  ?   Mouth: Mucous membranes are moist.  ?   Pharynx: Oropharynx is clear.  ?Eyes:  ?   Conjunctiva/sclera: Conjunctivae normal.  ?Cardiovascular:  ?   Rate and Rhythm: Normal rate and regular rhythm.  ?   Heart sounds: Normal heart sounds.  ?Pulmonary:  ?   Effort: Pulmonary effort is normal.  ?   Breath sounds: Normal breath sounds.  ?  Comments: Lungs clear to auscultation ?Musculoskeletal:  ?   Cervical back: Neck supple.  ?Skin: ?   General: Skin is warm.  ?   Comments: No rashes or urticarial lesions noted. Erythema noted on left anterior chest region  ?Neurological:  ?   Mental Status: He is alert and oriented to person, place, and time.  ?Psychiatric:     ?   Mood and Affect: Mood normal.     ?   Behavior: Behavior normal.     ?   Thought Content: Thought content normal.     ?   Judgment: Judgment normal.  ? ? ?Diagnostics: ?Percutaneous skin testing today was positive for penicillin G 5000 u/ml (4x5) with a poor histamine response. ? Penicillin - 04/18/21 1125   ? ? Manufacturer Br[employer]en   ? Lot # [id number]   ? Location Arm   ? Number of  allergen test 4   ? Time Testing Placed [phone]   ? Control SPT Negative   ? Histamine SPT --   +-  ? Pre-Pen Puncture Negative   ? Penicillin-G 5000 u/ml SPT --   4x5  ? ?  ?  ? ?  ? ? ? ?Assessment and Plan: ?1. History of penicillin allergy   ?2. Drug allergy   ? ? ?No orders of the defined types were placed in this encounter. ? ? ?Patient Instructions  ?Penicillin allergy: ?- Your skin testing today was positive to penicillin-G 5,000 u/ml puncture. Continue to avoid penicillin drugs ?-   Do not pick up/take the amoxicillin '400mg'$ /37m that was prescribed by Dr. DSimona Huhto bring to today's office visit. This is a penicillin drug ? 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 ?Keep your already scheduled appointment for patch placement on April 23, 2021 at 3:30 PM ? ?Return in about 5 days (around 04/23/2021), or if symptoms worsen or fail to improve, for patch testing. ?  ? ?Thank you for the opportunity to care for this patient.  Please do not hesitate to contact me with questions. ? ?CAlthea Charon FNP ?[facility]? ? ? ? ?

## 2021-04-18 NOTE — Patient Instructions (Addendum)
Penicillin allergy: ?- Your skin testing today was positive to penicillin-G 5,000 u/ml puncture. Continue to avoid penicillin drugs ?-   Do not pick up/take the amoxicillin '400mg'$ /41m that was prescribed by Dr. DSimona Huhto bring to today's office visit. This is a penicillin drug ? 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 ?Keep your already scheduled appointment for patch placement on April 23, 2021 at 3:30 PM ?

## 2021-04-19 ENCOUNTER — Telehealth: Payer: Self-pay | Admitting: Internal Medicine

## 2021-04-19 NOTE — Telephone Encounter (Signed)
Pt states he had cosmetic surgery performed on 04-12-2021 and has to have the sutures removed after 12 days ? ?Inquired why pt could not have MD that performed surgery removed sutures, pt states procedure was done in [facility] ? ?Sutures are located on neck measuring an inch long, pt is aware depending on assessment provider may not be able to remove sutures  ?

## 2021-04-23 ENCOUNTER — Encounter: Payer: Self-pay | Admitting: Family

## 2021-04-23 ENCOUNTER — Ambulatory Visit (INDEPENDENT_AMBULATORY_CARE_PROVIDER_SITE_OTHER): Payer: Medicare Other | Admitting: Family

## 2021-04-23 VITALS — BP 158/90 | HR 75 | Temp 98.5°F | Resp 16 | Wt 246.8 lb

## 2021-04-23 DIAGNOSIS — L230 Allergic contact dermatitis due to metals: Secondary | ICD-10-CM

## 2021-04-23 NOTE — Patient Instructions (Addendum)
Concern for metal allergy:  ?He does not wish to have completed. Let us know if you change your mind ?-  If you change your mind you will need to be off all topical steroids for 2 weeks prior to this appointment, and any oral or injections of steroid for 4 weeks prior to appointment. The patch will be placed on a Monday and you will come back for readings on Wednesday and Friday.  You will not be able to shower after the patch is placed on a Monday until it is taken off on Wednesday. ?- for rash, agree with exploring treatment options with Dermatology  ? ?Penicillin allergy: ?- Your skin testing on 04/18/21 was positive to penicillin-G 5,000 u/ml puncture. Continue to avoid penicillin drugs ?-   Do not pick up/take the amoxicillin '400mg'$ /22m that was prescribed by Dr. DSimona Huhto bring to today's office visit. This is a penicillin drug ?  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  ? ?Recommend contacting your primary care physician about removing the sutures on the back of your neck region ? ?Schedule a follow up  appointment in 6 months or sooner if needed ?

## 2021-04-23 NOTE — Progress Notes (Signed)
? ?[facility] [place] [id number] ?Dept: [phone] ? ?FOLLOW UP NOTE ? ?Patient ID: Chris Obrien, male    DOB: [birth year]  Age: 71 y.o. MRN: [id number] ?Date of Office Visit: 04/23/2021 ? ?Assessment  ?Chief Complaint: Other (Patch placement) ? ?HPI ?[name] is a 71 year old male who presents today for metal patch testing.  He was last seen on April 18, 2021 by myself for penicillin skin testing for which he did not pass.  Since his last office visit he denies any new diagnosis.  He asks if we are able to remove the 2 stitches on the back of his neck that are ready to come out.  He reports that his dermatologist in [place] put in the stitches in and his primary care physician was supposed to take them out tomorrow, but the nurse is out.  Instructed that we did not have the materials needed to remove the sutures. ? ?He does not wish to complete metal patch testing today because he did not know that there was restrictions while the patches were on his back.  He reports that he plans on working on his deck this week and this will cause him to be sweaty.  He also reports that he does not plan on having any other surgeries.  He is just going to assume that he is allergic to nickel.  Instructed the importance of finding out if he is allergic to any metals in case there are any surgeries needed in the future that are not planned. ? ?Perennial allergic rhinitis is reported as controlled with no medications at this time.  He reports occasional rhinorrhea and denies nasal congestion and postnasal drip.  He has not had any sinus infections since we last saw him. ? ?Drug Allergies:  ?Allergies  ?Allergen Reactions  ? Amoxicillin Hives  ? Vancomycin Rash  ?  Severe redness all over body with swollen ankles and hands  ? Tramadol Itching  ?  2019 Shoulder post-op  ? Oxycodone Rash  ? Penicillins Itching and Rash  ? ? ?Review of Systems: ?Review of Systems  ?Constitutional:  Negative for chills and fever.   ?HENT:    ?     Reports rhinorrhea at times and denies nasal congestion and postnasal drip  ?Eyes:   ?     Denies itchy watery eyes  ?Respiratory:  Negative for cough, shortness of breath and wheezing.   ?Cardiovascular:  Negative for chest pain and palpitations.  ?Gastrointestinal:   ?     Denies heartburn or reflux symptoms  ?Skin:  Positive for rash. Negative for itching.  ?Neurological:  Negative for headaches.  ?Endo/Heme/Allergies:  Positive for environmental allergies.  ? ? ?Physical Exam: ?BP (!) 158/90   Pulse 75   Temp 98.5 ?F (36.9 ?C) (Temporal)   Resp 16   Wt 246 lb 12.8 oz (111.9 kg)   SpO2 95%   BMI 32.56 kg/m?   ? ?Physical Exam ?Constitutional:   ?   Appearance: Normal appearance.  ?HENT:  ?   Head: Normocephalic and atraumatic.  ?   Comments: Pharynx normal, eyes normal, ears normal, nose: Bilateral lower turbinates moderately edematous and slightly erythematous with no drainage noted ?   Right Ear: Tympanic membrane, ear canal and external ear normal.  ?   Left Ear: Tympanic membrane, ear canal and external ear normal.  ?   Mouth/Throat:  ?   Mouth: Mucous membranes are moist.  ?   Pharynx: Oropharynx is clear.  ?  Eyes:  ?   Conjunctiva/sclera: Conjunctivae normal.  ?Cardiovascular:  ?   Rate and Rhythm: Normal rate and regular rhythm.  ?   Heart sounds: Normal heart sounds.  ?Pulmonary:  ?   Effort: Pulmonary effort is normal.  ?   Breath sounds: Normal breath sounds.  ?   Comments: Lungs clear to auscultation ?Musculoskeletal:  ?   Cervical back: Neck supple.  ?Skin: ?   General: Skin is warm.  ?   Comments: Suture noted on back of left-sided neck.  Erythema noted on left anterior chest region.  ?Neurological:  ?   Mental Status: He is alert and oriented to person, place, and time.  ?Psychiatric:     ?   Mood and Affect: Mood normal.     ?   Behavior: Behavior normal.     ?   Thought Content: Thought content normal.     ?   Judgment: Judgment normal.  ? ? ?Diagnostics: ?None ? ?Assessment  and Plan: ?1. Allergic contact dermatitis due to metals   ? ? ?No orders of the defined types were placed in this encounter. ? ? ?Patient Instructions  ?Concern for metal allergy:  ?He does not wish to have completed. Let us know if you change your mind ?-  If you change your mind you will need to be off all topical steroids for 2 weeks prior to this appointment, and any oral or injections of steroid for 4 weeks prior to appointment. The patch will be placed on a Monday and you will come back for readings on Wednesday and Friday.  You will not be able to shower after the patch is placed on a Monday until it is taken off on Wednesday. ?- for rash, agree with exploring treatment options with Dermatology  ? ?Penicillin allergy: ?- Your skin testing on 04/18/21 was positive to penicillin-G 5,000 u/ml puncture. Continue to avoid penicillin drugs ?-   Do not pick up/take the amoxicillin '400mg'$ /16m that was prescribed by Dr. DSimona Huhto bring to today's office visit. This is a penicillin drug ?  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  ? ?Recommend contacting your primary care physician about removing the sutures on the back of your neck region ? ?Schedule a follow up  appointment in 6 months or sooner if needed ?Return in about 6 months (around 10/23/2021), or if symptoms worsen or fail to improve. ?  ? ?Thank you for the opportunity to care for this patient.  Please do not hesitate to contact me with questions. ? ?CAlthea Charon FNP ?[facility]? ? ? ? ?

## 2021-04-26 ENCOUNTER — Ambulatory Visit: Payer: Medicare Other | Admitting: Nurse Practitioner

## 2021-04-27 ENCOUNTER — Ambulatory Visit (INDEPENDENT_AMBULATORY_CARE_PROVIDER_SITE_OTHER): Payer: Medicare Other | Admitting: Nurse Practitioner

## 2021-04-27 VITALS — BP 134/72 | HR 85 | Temp 98.5°F | Ht 73.0 in | Wt 240.0 lb

## 2021-04-27 DIAGNOSIS — Z4802 Encounter for removal of sutures: Secondary | ICD-10-CM

## 2021-04-27 DIAGNOSIS — M25512 Pain in left shoulder: Secondary | ICD-10-CM | POA: Diagnosis not present

## 2021-04-27 DIAGNOSIS — G8929 Other chronic pain: Secondary | ICD-10-CM | POA: Diagnosis not present

## 2021-04-27 MED ORDER — CEFADROXIL 500 MG PO CAPS: 500.0000 mg | ORAL_CAPSULE | Freq: Two times a day (BID) | ORAL | 1 refills | Status: DC

## 2021-04-27 NOTE — Progress Notes (Signed)
? ? ? ?Subjective:  ?Patient ID: Chris Obrien, male    DOB: [birth year]  Age: 71 y.o. MRN: [id number] ? ?CC:  ?Chief Complaint  ?Patient presents with  ? suture removal  ?  Two small sutures on back  ?  ? ? ?HPI  ?This patient arrives today for the above. ? ?Approximately 13 days ago he had skin biopsy a dermatologist in [place].  He did require 2 sutures at that time.  He is here today to have the sutures removed, he tells me that he was told to have suture removal at about 12 days after sutures placed.  He has no pain at the site.  Sutures are at the posterior base of his neck.  He is also requesting refill on his cefadroxil which she reports taking chronically.  He follows up with his PCP in approximately 2 weeks. ? 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  He reports that he takes Duricef twice a day, and was told to take this chronically.  Per chart review I do see where Dr. [name] (Orthopedics with [facility]) noted that patient is to be on this medication for at least 6 months.  He is requesting a refill of this medication today.  He is scheduled to follow back up with his orthopedist in approximately 1 month. ? ?Past Medical History:  ?Diagnosis Date  ? 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 right pre-patella---  secondary contact dermitis with poison ivy exposure 03-26-2012  ? Angio-edema   ? Arthritis   ? Benign neoplasm of colon   ? BPH (benign prostatic hypertrophy)   ? hx small stream  ? 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 GERD (gastroesophageal reflux disease)   ? rare   ? Insomnia, unspecified   ? Oligospermia   ? OSA on CPAP   ? PT LOST  WEIGHT - NO LONGER USING CPAP  ? Pain   ? RIGHT SHOULDER DELTOID MUSCLE ATROPHY - LOSS OF STRENGT VERY LIMITED RANGE OF MOTTION  ? Right knee skin infection   ? Sleep apnea   ? weight loss so no cpap now   ? Swelling of joint of right knee   ? Urticaria    ? ? ? ? ?Family History  ?Problem Relation Age of Onset  ? Alzheimer's disease Mother   ? Arthritis Father   ? Diabetes Brother   ? Hyperlipidemia Other   ? Colon cancer Neg Hx   ? Colon polyps Neg Hx   ? Esophageal cancer Neg Hx   ? Stomach cancer Neg Hx   ? Rectal cancer Neg Hx   ? ? ?Social History  ? ?Social History Narrative  ? Caffeine 4 cups avg daily, 4 cans soda.  ? ?Social History  ? ?Tobacco Use  ? Smoking status: Former  ?  Types: Cigars  ?  Quit date: 99  ?  Years since quitting: 33.3  ? Smokeless tobacco: Never  ?Substance Use Topics  ? Alcohol use: Yes  ?  Comment: occasionally  ? ? ? ?Current Meds  ?Medication Sig  ? amoxicillin (AMOXIL) 400 MG/5ML suspension DO NOT TAKE.  Bring to your allergy appointment.  ? celecoxib (CELEBREX) 200 MG capsule Take 1 capsule (200 mg total) by mouth every 12 (twelve) hours.  ? Glucosamine-Chondroit-Vit C-Mn (GLUCOSAMINE CHONDR 1500 COMPLX) CAPS Take 1 capsule by mouth 2 (two) times daily.   ? Glucosamine-Chondroitin  500-400 MG CAPS Take by mouth.  ? Multiple Vitamin (MULTIVITAMIN WITH MINERALS) TABS Take 1 tablet by mouth every morning.   ? tamsulosin (FLOMAX) 0.4 MG CAPS capsule Take by mouth.  ? [DISCONTINUED] cefadroxil (DURICEF) 500 MG capsule Take 1 capsule (500 mg total) by mouth 2 (two) times daily.  ? ? ?ROS:  ?See HPI I ? ? ?Objective:  ? ?Today's Vitals: BP 134/72 (BP Location: Right Arm, Patient Position: Sitting, Cuff Size: Large)   Pulse 85   Temp 98.5 ?F (36.9 ?C) (Oral)   Ht '6\' 1"'$  (1.854 m)   Wt 240 lb (108.9 kg)   SpO2 92%   BMI 31.66 kg/m?  ? ?  04/27/2021  ?  1:01 PM 04/23/2021  ?  3:32 PM 04/18/2021  ?  8:35 AM  ?Vitals with BMI  ?Height '6\' 1"'$   '6\' 1"'$   ?Weight 240 lbs 246 lbs 13 oz 247 lbs 4 oz  ?BMI 31.67 32.57 32.63  ?Systolic 376 283 151  ?Diastolic 72 90 82  ?Pulse 85 75 69  ?  ? ?Physical Exam ?Vitals reviewed.  ?Constitutional:   ?   Appearance: Normal appearance.  ?HENT:  ?   Head: Normocephalic and atraumatic.  ?Cardiovascular:  ?    Rate and Rhythm: Normal rate and regular rhythm.  ?Pulmonary:  ?   Effort: Pulmonary effort is normal.  ?   Breath sounds: Normal breath sounds.  ?Musculoskeletal:  ?   Cervical back: Neck supple.  ?Skin: ?   General: Skin is warm and dry.  ? ?    ?   Comments: Location of sutures.  Skin well approximated, no redness/no edema/no drainage.  ?Neurological:  ?   Mental Status: He is alert and oriented to person, place, and time.  ?Psychiatric:     ?   Mood and Affect: Mood normal.     ?   Behavior: Behavior normal.     ?   Thought Content: Thought content normal.     ?   Judgment: Judgment normal.  ? ? ? ? ? ? ? ?Assessment and Plan  ? ?1. Visit for suture removal   ?2. Chronic left shoulder pain   ? ? ? ?Plan: ?See plan via problem list below. ? ? ?Tests ordered ?No orders of the defined types were placed in this encounter. ? ? ? ? ?Meds ordered this encounter  ?Medications  ? cefadroxil (DURICEF) 500 MG capsule  ?  Sig: Take 1 capsule (500 mg total) by mouth 2 (two) times daily.  ?  Dispense:  28 capsule  ?  Refill:  1  ?  Order Specific Question:   Supervising Provider  ?  Answer:   Binnie Rail [[id number]]  ? ? ?Patient to follow-up with PCP in 2 weeks as scheduled, or sooner as needed. ? ?Ailene Ards, NP ? ?

## 2021-04-27 NOTE — Assessment & Plan Note (Signed)
Chronic, stable.  Duricef 500 mg capsules to be taken by mouth twice a day refilled today. ?

## 2021-04-27 NOTE — Assessment & Plan Note (Signed)
Sutures removed, site remained well approximated.  No signs of bleeding/infection noted.  Triple antibiotic ointment administered. ?

## 2021-05-07 ENCOUNTER — Ambulatory Visit (INDEPENDENT_AMBULATORY_CARE_PROVIDER_SITE_OTHER): Payer: Medicare Other

## 2021-05-07 VITALS — Wt 240.0 lb

## 2021-05-07 DIAGNOSIS — Z0000 Encounter for general adult medical examination without abnormal findings: Secondary | ICD-10-CM | POA: Diagnosis not present

## 2021-05-07 NOTE — Patient Instructions (Signed)
Chris Obrien , Thank you for taking time to come for your Medicare Wellness Visit. I appreciate your ongoing commitment to your health goals. Please review the following plan we discussed and let me know if I can assist you in the future.   Screening recommendations/referrals: Colonoscopy: Cologuard 12/09/19 Recommended yearly ophthalmology/optometry visit for glaucoma screening and checkup Recommended yearly dental visit for hygiene and checkup  Vaccinations: Influenza vaccine: n/d Pneumococcal vaccine: 06/12/15 Tdap vaccine: 01/08/08, due Shingles vaccine: Zostavax 12/17/13   Shingrix  08/13/16, 10/18/16   Covid-19: 01/26/19, 02/22/19, 11/08/19, 11/17/20  Advanced directives: no  Conditions/risks identified: none  Next appointment: Follow up in one year for your annual wellness visit. -  declined  Preventive Care 73 Years and Older, Male Preventive care refers to lifestyle choices and visits with your health care provider that can promote health and wellness. What does preventive care include? A yearly physical exam. This is also called an annual well check. Dental exams once or twice a year. Routine eye exams. Ask your health care provider how often you should have your eyes checked. Personal lifestyle choices, including: Daily care of your teeth and gums. Regular physical activity. Eating a healthy diet. Avoiding tobacco and drug use. Limiting alcohol use. Practicing safe sex. Taking low doses of aspirin every day. Taking vitamin and mineral supplements as recommended by your health care provider. What happens during an annual well check? The services and screenings done by your health care provider during your annual well check will depend on your age, overall health, lifestyle risk factors, and family history of disease. Counseling  Your health care provider may ask you questions about your: Alcohol use. Tobacco use. Drug use. Emotional well-being. Home and relationship  well-being. Sexual activity. Eating habits. History of falls. Memory and ability to understand (cognition). Work and work Statistician. Screening  You may have the following tests or measurements: Height, weight, and BMI. Blood pressure. Lipid and cholesterol levels. These may be checked every 5 years, or more frequently if you are over 60 years old. Skin check. Lung cancer screening. You may have this screening every year starting at age 43 if you have a 30-pack-year history of smoking and currently smoke or have quit within the past 15 years. Fecal occult blood test (FOBT) of the stool. You may have this test every year starting at age 73. Flexible sigmoidoscopy or colonoscopy. You may have a sigmoidoscopy every 5 years or a colonoscopy every 10 years starting at age 73. Prostate cancer screening. Recommendations will vary depending on your family history and other risks. Hepatitis C blood test. Hepatitis B blood test. Sexually transmitted disease (STD) testing. Diabetes screening. This is done by checking your blood sugar (glucose) after you have not eaten for a while (fasting). You may have this done every 1-3 years. Abdominal aortic aneurysm (AAA) screening. You may need this if you are a current or former smoker. Osteoporosis. You may be screened starting at age 73 if you are at high risk. Talk with your health care provider about your test results, treatment options, and if necessary, the need for more tests. Vaccines  Your health care provider may recommend certain vaccines, such as: Influenza vaccine. This is recommended every year. Tetanus, diphtheria, and acellular pertussis (Tdap, Td) vaccine. You may need a Td booster every 10 years. Zoster vaccine. You may need this after age 55. Pneumococcal 13-valent conjugate (PCV13) vaccine. One dose is recommended after age 59. Pneumococcal polysaccharide (PPSV23) vaccine. One dose is recommended after  age 45. Talk to your health care  provider about which screenings and vaccines you need and how often you need them. This information is not intended to replace advice given to you by your health care provider. Make sure you discuss any questions you have with your health care provider. Document Released: 01/20/2015 Document Revised: 09/13/2015 Document Reviewed: 10/25/2014 Elsevier Interactive Patient Education  2017 [place] Prevention in the Home Falls can cause injuries. They can happen to people of all ages. There are many things you can do to make your home safe and to help prevent falls. What can I do on the outside of my home? Regularly fix the edges of walkways and driveways and fix any cracks. Remove anything that might make you trip as you walk through a door, such as a raised step or threshold. Trim any bushes or trees on the path to your home. Use bright outdoor lighting. Clear any walking paths of anything that might make someone trip, such as rocks or tools. Regularly check to see if handrails are loose or broken. Make sure that both sides of any steps have handrails. Any raised decks and porches should have guardrails on the edges. Have any leaves, snow, or ice cleared regularly. Use sand or salt on walking paths during winter. Clean up any spills in your garage right away. This includes oil or grease spills. What can I do in the bathroom? Use night lights. Install grab bars by the toilet and in the tub and shower. Do not use towel bars as grab bars. Use non-skid mats or decals in the tub or shower. If you need to sit down in the shower, use a plastic, non-slip stool. Keep the floor dry. Clean up any water that spills on the floor as soon as it happens. Remove soap buildup in the tub or shower regularly. Attach bath mats securely with double-sided non-slip rug tape. Do not have throw rugs and other things on the floor that can make you trip. What can I do in the bedroom? Use night lights. Make  sure that you have a light by your bed that is easy to reach. Do not use any sheets or blankets that are too big for your bed. They should not hang down onto the floor. Have a firm chair that has side arms. You can use this for support while you get dressed. Do not have throw rugs and other things on the floor that can make you trip. What can I do in the kitchen? Clean up any spills right away. Avoid walking on wet floors. Keep items that you use a lot in easy-to-reach places. If you need to reach something above you, use a strong step stool that has a grab bar. Keep electrical cords out of the way. Do not use floor polish or wax that makes floors slippery. If you must use wax, use non-skid floor wax. Do not have throw rugs and other things on the floor that can make you trip. What can I do with my stairs? Do not leave any items on the stairs. Make sure that there are handrails on both sides of the stairs and use them. Fix handrails that are broken or loose. Make sure that handrails are as long as the stairways. Check any carpeting to make sure that it is firmly attached to the stairs. Fix any carpet that is loose or worn. Avoid having throw rugs at the top or bottom of the stairs. If you do  have throw rugs, attach them to the floor with carpet tape. Make sure that you have a light switch at the top of the stairs and the bottom of the stairs. If you do not have them, ask someone to add them for you. What else can I do to help prevent falls? Wear shoes that: Do not have high heels. Have rubber bottoms. Are comfortable and fit you well. Are closed at the toe. Do not wear sandals. If you use a stepladder: Make sure that it is fully opened. Do not climb a closed stepladder. Make sure that both sides of the stepladder are locked into place. Ask someone to hold it for you, if possible. Clearly mark and make sure that you can see: Any grab bars or handrails. First and last steps. Where the  edge of each step is. Use tools that help you move around (mobility aids) if they are needed. These include: Canes. Walkers. Scooters. Crutches. Turn on the lights when you go into a dark area. Replace any light bulbs as soon as they burn out. Set up your furniture so you have a clear path. Avoid moving your furniture around. If any of your floors are uneven, fix them. If there are any pets around you, be aware of where they are. Review your medicines with your doctor. Some medicines can make you feel dizzy. This can increase your chance of falling. Ask your doctor what other things that you can do to help prevent falls. This information is not intended to replace advice given to you by your health care provider. Make sure you discuss any questions you have with your health care provider. Document Released: 10/20/2008 Document Revised: 06/01/2015 Document Reviewed: 01/28/2014 Elsevier Interactive Patient Education  2017 [employer].

## 2021-05-07 NOTE — Progress Notes (Addendum)
Virtual Visit via Telephone Note  I connected with  Chris Obrien on 05/07/21 at  1:45 PM EDT by telephone and verified that I am speaking with the correct person using two identifiers.  Location: Patient: home Provider: Ether Griffins Persons participating in the virtual visit: [facility]   I discussed the limitations, risks, security and privacy concerns of performing an evaluation and management service by telephone and the availability of in person appointments. The patient expressed understanding and agreed to proceed.  Interactive audio and video telecommunications were attempted between this nurse and patient, however failed, due to patient having technical difficulties OR patient did not have access to video capability.  We continued and completed visit with audio only.  Some vital signs may be absent or patient reported.   Dionisio Toma, LPN  Subjective:   Chris Obrien is a 71 y.o. male who presents for Medicare Annual/Subsequent preventive examination.  Review of Systems           Objective:    There were no vitals filed for this visit. There is no height or weight on file to calculate BMI.     07/17/2015   10:50 AM 07/05/2015    8:10 AM 06/15/2012   11:15 AM 06/03/2012    8:38 AM 04/28/2012    1:00 AM 04/24/2012   10:43 AM 04/02/2012    1:17 PM  Advanced Directives  Does Patient Have a Medical Advance Directive? Yes Yes Patient has advance directive, copy not in chart Patient has advance directive, copy not in chart Patient has advance directive, copy not in chart Patient has advance directive, copy not in chart Patient has advance directive, copy not in chart  Type of Advance Directive [place];Living will [place];Living will [place];Living will [place];Living will [place];Living will [place];Living will [place];Living will  Does patient want to make changes to medical advance directive? No - Patient declined No - Patient declined       Copy of [place] in Chart? Yes Yes Copy requested from family  Copy requested from family Copy requested from family   Pre-existing out of facility DNR order (yellow form or pink MOST form)   No  No  No    Current Medications (verified) Outpatient Encounter Medications as of 05/07/2021  Medication Sig   amoxicillin (AMOXIL) 400 MG/5ML suspension DO NOT TAKE.  Bring to your allergy appointment.   cefadroxil (DURICEF) 500 MG capsule Take 1 capsule (500 mg total) by mouth 2 (two) times daily.   celecoxib (CELEBREX) 200 MG capsule Take 1 capsule (200 mg total) by mouth every 12 (twelve) hours.   Glucosamine-Chondroit-Vit C-Mn (GLUCOSAMINE CHONDR 1500 COMPLX) CAPS Take 1 capsule by mouth 2 (two) times daily.    Glucosamine-Chondroitin 500-400 MG CAPS Take by mouth.   Multiple Vitamin (MULTIVITAMIN WITH MINERALS) TABS Take 1 tablet by mouth every morning.    tamsulosin (FLOMAX) 0.4 MG CAPS capsule Take by mouth.   No facility-administered encounter medications on file as of 05/07/2021.    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   GERD (gastroesophageal reflux disease)    rare    Insomnia, unspecified    Oligospermia    OSA on CPAP    PT LOST  WEIGHT - NO LONGER USING CPAP   Pain    RIGHT SHOULDER DELTOID MUSCLE ATROPHY - LOSS OF STRENGT VERY LIMITED RANGE OF MOTTION   Right knee skin infection    Sleep apnea    weight loss so no cpap now     Swelling of joint of right knee    Urticaria    Past Surgical History:  Procedure Laterality Date   ADENOIDECTOMY     COLONOSCOPY     EXCISIONAL TOTAL KNEE ARTHROPLASTY WITH ANTIBIOTIC SPACERS Right 04/27/2012   Procedure: EXCISIONAL TOTAL KNEE ARTHROPLASTY WITH ANTIBIOTIC SPACERS;  Surgeon: Mauri Pole, MD;  Location: [facility];  Service: Orthopedics;  Laterality: Right;  SAME DAY LABS   IRRIGATION AND DEBRIDEMENT KNEE Right 04/02/2012   Procedure: IRRIGATION AND DEBRIDEMENT RIGHT PRE-PATELLA  ;  Surgeon: Mauri Pole, MD;  Location: [place];  Service: Orthopedics;  Laterality: Right;   KNEE ARTHROSCOPY Right aug 2012   POLYPECTOMY     SINOSCOPY     TONSILLECTOMY     TOTAL KNEE ARTHROPLASTY  01/28/2012   Procedure: TOTAL KNEE ARTHROPLASTY;  Surgeon: Mauri Pole, MD;  Location: [facility];  Service: Orthopedics;  Laterality: Right;   TOTAL KNEE ARTHROPLASTY Left 07/17/2015   Procedure: LEFT TOTAL KNEE ARTHROPLASTY;  Surgeon: Paralee Cancel, MD;  Location: [facility];  Service: Orthopedics;  Laterality: Left;   TOTAL KNEE REVISION Right 06/15/2012   Procedure: reimplantation of right total knee ;  Surgeon: Mauri Pole, MD;  Location: [facility];  Service: Orthopedics;  Laterality: Right;   TOTAL SHOULDER REPLACEMENT Bilateral    done in [place] Right 2008   Family History  Problem Relation Age of Onset   Alzheimer's disease Mother    Arthritis Father    Diabetes Brother    Hyperlipidemia Other    Colon cancer Neg Hx    Colon polyps Neg Hx    Esophageal cancer Neg Hx    Stomach cancer Neg Hx    Rectal cancer Neg Hx    Social History   Socioeconomic History   Marital status: Married    Spouse name: Not on file   Number of children: Not on file   Years of education: 16   Highest education level: Not on file  Occupational History   Occupation: Freight forwarder    Comment: Nature conservation officer   Occupation: MANAGER    Employer: [employer]  Tobacco Use    Smoking status: Former    Types: Cigars    Quit date: 1990    Years since quitting: 33.3   Smokeless tobacco: Never  Vaping Use   Vaping Use: Never used  Substance and Sexual Activity   Alcohol use: Yes    Comment: occasionally   Drug use: No   Sexual activity: Not on file  Other Topics Concern   Not on file  Social History Narrative   Caffeine 4 cups avg daily, 4 cans soda.   Social Determinants of Health   Financial Resource Strain: Not on file  Food Insecurity: Not on file  Transportation Needs: Not on file  Physical Activity: Not on file  Stress: Not on file  Social Connections: Not  on file    Tobacco Counseling Counseling given: Not Answered   Clinical Intake:  Pre-visit preparation completed: Yes        Nutritional Risks: None Diabetes: No  How often do you need to have someone help you when you read instructions, pamphlets, or other written materials from your doctor or pharmacy?: 1 - Never  Diabetic?no  Interpreter Needed?: No  Information entered by :: Kirke Shaggy, LPN   Activities of Daily Living     View : No data to display.          Patient Care Team: Plotnikov, Evie Lacks, MD as PCP - General Paralee Cancel, MD as Consulting Physician (Orthopedic Surgery) Lorenda Ishihara, MD as Referring Physician (Orthopedic Surgery) Comer, Okey Regal, MD as Consulting Physician (Infectious Diseases)  Indicate any recent Medical Services you may have received from other than [facility] providers in the past year (date may be approximate).     Assessment:   This is a routine wellness examination for Chris Obrien.  Hearing/Vision screen No results found.  Dietary issues and exercise activities discussed:     Goals Addressed   None    Depression Screen    06/16/2020    2:03 PM 09/10/2018    8:51 AM 09/07/2018    9:54 AM 09/01/2018    9:55 AM 08/08/2017    9:04 AM 11/21/2015    3:27 PM 05/28/2012    9:36 AM  PHQ 2/9 Scores  PHQ - 2 Score 0 0  0 0 0 0 0    Fall Risk    06/16/2020    2:03 PM 09/10/2018    8:51 AM 09/07/2018    9:53 AM 09/01/2018    9:55 AM 08/08/2017    9:04 AM  Fall Risk   Falls in the past year? 0 0 0 0 No  Number falls in past yr: 0  0 0   Injury with Fall? 0  0 0   Follow up Falls evaluation completed Falls evaluation completed       FALL RISK PREVENTION PERTAINING TO THE HOME:  Any stairs in or around the home? Yes  If so, are there any without handrails? No  Home free of loose throw rugs in walkways, pet beds, electrical cords, etc? Yes  Adequate lighting in your home to reduce risk of falls? Yes   ASSISTIVE DEVICES UTILIZED TO PREVENT FALLS:  Life alert? No  Use of a cane, walker or w/c? No  Grab bars in the bathroom? Yes  Shower chair or bench in shower? No  Elevated toilet seat or a handicapped toilet? Yes  Cognitive Function:       Immunizations Immunization History  Administered Date(s) Administered   Fluad Quad(high Dose 65+) 11/25/2019   Influenza Split 09/23/2011   Influenza, High Dose Seasonal PF 10/26/2016, 10/15/2017   Influenza,inj,Quad PF,6+ Mos 12/07/2012, 12/17/2013, 09/10/2018   Influenza-Unspecified 09/02/2018   Moderna Sars-Covid-2 Vaccination 01/26/2019, 02/22/2019, 11/08/2019, 11/17/2020   Pneumococcal Conjugate-13 06/12/2015   Pneumococcal Polysaccharide-23 12/21/2013   Td 01/08/2008, 10/08/2019   Zoster Recombinat (Shingrix) 08/13/2016, 10/18/2016   Zoster, Live 12/17/2013    TDAP status: Due, Education has been provided regarding the importance of this vaccine. Advised may receive this vaccine at local pharmacy or Health Dept. Aware to provide a copy of the vaccination record if obtained from local pharmacy or Health Dept. Verbalized acceptance and understanding.  Flu Vaccine status: Due, Education has been provided regarding the importance of this vaccine. Advised may receive this  vaccine at local pharmacy or Health Dept. Aware to provide a copy of the vaccination  record if obtained from local pharmacy or Health Dept. Verbalized acceptance and understanding.  Pneumococcal vaccine status: Up to date  Covid-19 vaccine status: Completed vaccines  Qualifies for Shingles Vaccine? Yes   Zostavax completed Yes   Shingrix Completed?: Yes  Screening Tests Health Maintenance  Topic Date Due   COLONOSCOPY (Pts 45-38yr Insurance coverage will need to be confirmed)  04/20/2019   COVID-19 Vaccine (5 - Booster for Moderna series) 01/12/2021   INFLUENZA VACCINE  08/07/2021   TETANUS/TDAP  10/07/2029   Pneumonia Vaccine 71 Years old  Completed   Hepatitis C Screening  Completed   Zoster Vaccines- Shingrix  Completed   HPV VACCINES  Aged Out    Health Maintenance  Health Maintenance Due  Topic Date Due   COLONOSCOPY (Pts 45-411yrInsurance coverage will need to be confirmed)  04/20/2019   COVID-19 Vaccine (5 - Booster for Moderna series) 01/12/2021    Colorectal cancer screening: Type of screening: Cologuard. Completed 12/09/19. Repeat every 3 years  Lung Cancer Screening: (Low Dose CT Chest recommended if Age 73-80ears, 30 pack-year currently smoking OR have quit w/in 15years.) does not qualify.   Additional Screening:  Hepatitis C Screening: does qualify; Completed no  Vision Screening: Recommended annual ophthalmology exams for early detection of glaucoma and other disorders of the eye. Is the patient up to date with their annual eye exam?  Yes  Who is the provider or what is the name of the office in which the patient attends annual eye exams? Dr.Digby If pt is not established with a provider, would they like to be referred to a provider to establish care? No .   Dental Screening: Recommended annual dental exams for proper oral hygiene  Community Resource Referral / Chronic Care Management: CRR required this visit?  No   CCM required this visit?  No      Plan:     I have personally reviewed and noted the following in the patient's  chart:   Medical and social history Use of alcohol, tobacco or illicit drugs  Current medications and supplements including opioid prescriptions. Patient is not currently taking opioid prescriptions. Functional ability and status Nutritional status Physical activity Advanced directives List of other physicians Hospitalizations, surgeries, and ER visits in previous 12 months Vitals Screenings to include cognitive, depression, and falls Referrals and appointments  In addition, I have reviewed and discussed with patient certain preventive protocols, quality metrics, and best practice recommendations. A written personalized care plan for preventive services as well as general preventive health recommendations were provided to patient.     LoDionisio DavidLPN   5/02/10/9322 Nurse Notes: none  Medical screening examination/treatment/procedure(s) were performed by non-physician practitioner and as supervising physician I was immediately available for consultation/collaboration.  I agree with above. AlLew DawesMD

## 2021-05-08 ENCOUNTER — Ambulatory Visit (INDEPENDENT_AMBULATORY_CARE_PROVIDER_SITE_OTHER): Payer: Medicare Other | Admitting: Internal Medicine

## 2021-05-08 ENCOUNTER — Encounter: Payer: Self-pay | Admitting: Internal Medicine

## 2021-05-08 VITALS — BP 142/88 | HR 72 | Temp 98.1°F | Ht 73.0 in | Wt 246.0 lb

## 2021-05-08 DIAGNOSIS — R197 Diarrhea, unspecified: Secondary | ICD-10-CM

## 2021-05-08 DIAGNOSIS — Z889 Allergy status to unspecified drugs, medicaments and biological substances status: Secondary | ICD-10-CM | POA: Diagnosis not present

## 2021-05-08 DIAGNOSIS — G8929 Other chronic pain: Secondary | ICD-10-CM

## 2021-05-08 DIAGNOSIS — M25512 Pain in left shoulder: Secondary | ICD-10-CM | POA: Diagnosis not present

## 2021-05-08 MED ORDER — CELECOXIB 200 MG PO CAPS: 200.0000 mg | ORAL_CAPSULE | Freq: Two times a day (BID) | ORAL | 3 refills | Status: DC

## 2021-05-08 NOTE — Patient Instructions (Signed)
Blue-Emu cream -- use 2-3 times a day ? ?

## 2021-05-08 NOTE — Assessment & Plan Note (Signed)
?  Pretreat for anticipated narcotic use: ?? ?            A.  Loratadine 10 mg -2 tablets daily (Claritin) ?            B.  Famotidine 20 mg -2 tablet daily (Pepcid) ?? ?2.  Can use hydrocodone as narcotic if required ?3.  Remain away from vancomycin and amoxicillin (penicillin) ?

## 2021-05-08 NOTE — Assessment & Plan Note (Addendum)
Discussed  Chris Obrien had allergy tests done with Dr Quita Skye 2023: - Your skin testing on 04/18/21 was positive to Penicillin-G 5,000 u/ml puncture. Continue to avoid penicillin drugs; no Amoxicillin 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: - ABSOLUTE CONTRAINDICATION - strict avoidance indicated

## 2021-05-08 NOTE — Assessment & Plan Note (Addendum)
Obtain CRP, ESR, CBC periodically ?On [place] now ? ?Blue-Emu cream was recommended to use 2-3 times a day ? ?

## 2021-05-08 NOTE — Progress Notes (Signed)
Subjective:  Patient ID: Chris Obrien, male    DOB: 1950-01-24  Age: 71 y.o. MRN: [id number]  CC: No chief complaint on file.   HPI Preciliano Castell Kibby presents for R shoulder fx and another replacement surgery with cadaver bone and prosthesis replacement. F/u  diarrhea, abx allergies  Outpatient Medications Prior to Visit  Medication Sig Dispense Refill   cefadroxil (DURICEF) 500 MG capsule Take 1 capsule (500 mg total) by mouth 2 (two) times daily. 28 capsule 1   Glucosamine-Chondroit-Vit C-Mn (GLUCOSAMINE CHONDR 1500 COMPLX) CAPS Take 1 capsule by mouth 2 (two) times daily.      Glucosamine-Chondroitin 500-400 MG CAPS Take by mouth.     Multiple Vitamin (MULTIVITAMIN WITH MINERALS) TABS Take 1 tablet by mouth every morning.      tamsulosin (FLOMAX) 0.4 MG CAPS capsule Take by mouth.     amoxicillin (AMOXIL) 400 MG/5ML suspension DO NOT TAKE.  Bring to your allergy appointment. 50 mL 0   celecoxib (CELEBREX) 200 MG capsule Take 1 capsule (200 mg total) by mouth every 12 (twelve) hours. 180 capsule 3   No facility-administered medications prior to visit.    ROS: Review of Systems  Constitutional:  Negative for appetite change, fatigue and unexpected weight change.  HENT:  Negative for congestion, nosebleeds, sneezing, sore throat and trouble swallowing.   Eyes:  Negative for itching and visual disturbance.  Respiratory:  Negative for cough.   Cardiovascular:  Negative for chest pain, palpitations and leg swelling.  Gastrointestinal:  Negative for abdominal distention, blood in stool, diarrhea and nausea.  Genitourinary:  Negative for frequency and hematuria.  Musculoskeletal:  Negative for back pain, gait problem, joint swelling and neck pain.  Skin:  Negative for rash.  Neurological:  Negative for dizziness, tremors, speech difficulty and weakness.  Psychiatric/Behavioral:  Negative for agitation, dysphoric mood, sleep disturbance and suicidal ideas. The patient is not  nervous/anxious.    Objective:  BP (!) 142/88 (BP Location: Left Arm, Patient Position: Sitting, Cuff Size: Normal)   Pulse 72   Temp 98.1 F (36.7 C) (Oral)   Ht '6\' 1"'  (1.854 m)   Wt 246 lb (111.6 kg)   SpO2 95%   BMI 32.46 kg/m   BP Readings from Last 3 Encounters:  05/08/21 (!) 142/88  04/27/21 134/72  04/23/21 (!) 158/90    Wt Readings from Last 3 Encounters:  05/08/21 246 lb (111.6 kg)  05/07/21 240 lb (108.9 kg)  04/27/21 240 lb (108.9 kg)    Physical Exam Constitutional:      General: He is not in acute distress.    Appearance: He is well-developed.     Comments: NAD  Eyes:     Conjunctiva/sclera: Conjunctivae normal.     Pupils: Pupils are equal, round, and reactive to light.  Neck:     Thyroid: No thyromegaly.     Vascular: No JVD.  Cardiovascular:     Rate and Rhythm: Normal rate and regular rhythm.     Heart sounds: Normal heart sounds. No murmur heard.   No friction rub. No gallop.  Pulmonary:     Effort: Pulmonary effort is normal. No respiratory distress.     Breath sounds: Normal breath sounds. No wheezing or rales.  Chest:     Chest wall: No tenderness.  Abdominal:     General: Bowel sounds are normal. There is no distension.     Palpations: Abdomen is soft. There is no mass.     Tenderness: There  is no abdominal tenderness. There is no guarding or rebound.  Musculoskeletal:        General: No tenderness. Normal range of motion.     Cervical back: Normal range of motion.  Lymphadenopathy:     Cervical: No cervical adenopathy.  Skin:    General: Skin is warm and dry.     Findings: No rash.  Neurological:     Mental Status: He is alert and oriented to person, place, and time.     Cranial Nerves: No cranial nerve deficit.     Motor: No abnormal muscle tone.     Coordination: Coordination normal.     Gait: Gait normal.     Deep Tendon Reflexes: Reflexes are normal and symmetric.  Psychiatric:        Behavior: Behavior normal.         Thought Content: Thought content normal.        Judgment: Judgment normal.  R shoulder w/a scar  Lab Results  Component Value Date   WBC 7.0 04/02/2021   HGB 13.7 04/02/2021   HCT 43.0 04/02/2021   PLT 295.0 Repeated and verified X2. 04/02/2021   GLUCOSE 126 (H) 04/02/2021   CHOL 142 11/25/2019   TRIG 55.0 11/25/2019   HDL 43.00 11/25/2019   LDLDIRECT 130.9 12/08/2012   LDLCALC 88 11/25/2019   ALT 14 04/02/2021   AST 20 04/02/2021   NA 139 04/02/2021   K 4.4 04/02/2021   CL 105 04/02/2021   CREATININE 0.94 04/02/2021   BUN 19 04/02/2021   CO2 26 04/02/2021   TSH 0.47 11/25/2019   PSA 3.35 11/25/2019   INR 1.09 06/03/2012   HGBA1C 5.4 05/23/2020    DG FLUORO GUIDED NEEDLE PLC ASPIRATION/INJECTION LOC  Result Date: 10/11/2020 CLINICAL DATA:  Chronic right worse than left shoulder pain. Prior bilateral reverse total shoulder arthroplasties. Skin rash around the left shoulder since surgery. Bilateral shoulder aspiration requested. FLUOROSCOPY TIME:  Radiation Exposure Index (as provided by the fluoroscopic device): 3.9 mGy Fluoroscopy Time:  35 seconds Number of Acquired Images:  0 PROCEDURE: The risks and benefits of the procedure were discussed with the patient, and written informed consent was obtained. The patient stated no history of allergy to contrast media. A formal timeout procedure was performed with the patient according to departmental protocol. RIGHT SHOULDER: The patient was placed supine on the fluoroscopy table and the right glenohumeral joint was identified under fluoroscopy. The skin overlying the right glenohumeral joint was subsequently cleaned with Betadine and a sterile drape was placed over the area of interest. 2 ml 1% Lidocaine was used to anesthetize the skin around the needle insertion site. A 20 gauge spinal needle was inserted into the right glenohumeral joint under fluoroscopy. 11 ml clear synovial fluid was aspirated and sent to the laboratory for analysis.  The needle was removed and hemostasis was achieved. LEFT SHOULDER: The patient was placed supine on the fluoroscopy table and the left glenohumeral joint was identified under fluoroscopy. The skin overlying the left glenohumeral joint was subsequently cleaned with Betadine and a sterile drape was placed over the area of interest. 2 ml 1% Lidocaine was used to anesthetize the skin around the needle insertion site. A 20 gauge spinal needle was inserted into the left glenohumeral joint under fluoroscopy. 0.5 ml bloody synovial fluid was aspirated and sent to the laboratory for analysis. The needle was removed and hemostasis was achieved. The patient tolerated the procedure well without complication. IMPRESSION: Technically successful bilateral shoulder  aspirations. Electronically Signed   By: Titus Dubin M.D.   On: 10/11/2020 13:42   DG FLUORO GUIDED NEEDLE PLC ASPIRATION/INJECTION LOC  Result Date: 10/11/2020 CLINICAL DATA:  Chronic right worse than left shoulder pain. Prior bilateral reverse total shoulder arthroplasties. Skin rash around the left shoulder since surgery. Bilateral shoulder aspiration requested. FLUOROSCOPY TIME:  Radiation Exposure Index (as provided by the fluoroscopic device): 3.9 mGy Fluoroscopy Time:  35 seconds Number of Acquired Images:  0 PROCEDURE: The risks and benefits of the procedure were discussed with the patient, and written informed consent was obtained. The patient stated no history of allergy to contrast media. A formal timeout procedure was performed with the patient according to departmental protocol. RIGHT SHOULDER: The patient was placed supine on the fluoroscopy table and the right glenohumeral joint was identified under fluoroscopy. The skin overlying the right glenohumeral joint was subsequently cleaned with Betadine and a sterile drape was placed over the area of interest. 2 ml 1% Lidocaine was used to anesthetize the skin around the needle insertion site. A 20 gauge  spinal needle was inserted into the right glenohumeral joint under fluoroscopy. 11 ml clear synovial fluid was aspirated and sent to the laboratory for analysis. The needle was removed and hemostasis was achieved. LEFT SHOULDER: The patient was placed supine on the fluoroscopy table and the left glenohumeral joint was identified under fluoroscopy. The skin overlying the left glenohumeral joint was subsequently cleaned with Betadine and a sterile drape was placed over the area of interest. 2 ml 1% Lidocaine was used to anesthetize the skin around the needle insertion site. A 20 gauge spinal needle was inserted into the left glenohumeral joint under fluoroscopy. 0.5 ml bloody synovial fluid was aspirated and sent to the laboratory for analysis. The needle was removed and hemostasis was achieved. The patient tolerated the procedure well without complication. IMPRESSION: Technically successful bilateral shoulder aspirations. Electronically Signed   By: Titus Dubin M.D.   On: 10/11/2020 13:42    Assessment & Plan:   Problem List Items Addressed This Visit     Chronic left shoulder pain - Primary    Obtain CRP, ESR, CBC periodically On Duricef now  Blue-Emu cream was recommended to use 2-3 times a day        Relevant Medications   celecoxib (CELEBREX) 200 MG capsule   Other Relevant Orders   CBC with Differential/Platelet   Iron, TIBC and Ferritin Panel   C-reactive protein   Sedimentation rate   Comprehensive metabolic panel   Drug allergy    Discussed  [name] had allergy tests done with Dr Quita Skye 2023: - Your skin testing on 04/18/21 was positive to Penicillin-G 5,000 u/ml puncture. Continue to avoid penicillin drugs; no Amoxicillin  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: - ABSOLUTE CONTRAINDICATION - strict avoidance indicated        Diarrhea    No relapse       Relevant Medications   celecoxib (CELEBREX) 200 MG capsule      Meds ordered this encounter   Medications   celecoxib (CELEBREX) 200 MG capsule    Sig: Take 1 capsule (200 mg total) by mouth every 12 (twelve) hours.    Dispense:  180 capsule    Refill:  3      Follow-up: Return in about 3 months (around 08/08/2021) for a follow-up visit.  Walker Kehr, MD

## 2021-05-25 NOTE — Assessment & Plan Note (Signed)
No relapse 

## 2021-06-05 ENCOUNTER — Other Ambulatory Visit: Payer: Self-pay | Admitting: Internal Medicine

## 2021-06-05 DIAGNOSIS — G8929 Other chronic pain: Secondary | ICD-10-CM

## 2021-06-05 DIAGNOSIS — M25512 Pain in left shoulder: Secondary | ICD-10-CM

## 2021-06-06 ENCOUNTER — Telehealth: Payer: Self-pay

## 2021-06-06 NOTE — Telephone Encounter (Signed)
[facility] pharmacy is calling requesting a 3 mo supply of cefadroxil (DURICEF) 500 MG capsule.  Please advise

## 2021-06-07 NOTE — Telephone Encounter (Signed)
Renewed. Thanks.

## 2021-07-24 ENCOUNTER — Encounter: Payer: Self-pay | Admitting: Internal Medicine

## 2021-10-02 ENCOUNTER — Encounter: Payer: Self-pay | Admitting: Internal Medicine

## 2021-10-04 ENCOUNTER — Encounter: Payer: Self-pay | Admitting: Internal Medicine

## 2021-10-04 ENCOUNTER — Ambulatory Visit (INDEPENDENT_AMBULATORY_CARE_PROVIDER_SITE_OTHER): Payer: Medicare Other | Admitting: Internal Medicine

## 2021-10-04 VITALS — BP 144/82 | HR 68 | Temp 98.3°F | Ht 73.0 in | Wt 241.8 lb

## 2021-10-04 DIAGNOSIS — G8929 Other chronic pain: Secondary | ICD-10-CM

## 2021-10-04 DIAGNOSIS — M25512 Pain in left shoulder: Secondary | ICD-10-CM | POA: Diagnosis not present

## 2021-10-04 DIAGNOSIS — Z0000 Encounter for general adult medical examination without abnormal findings: Secondary | ICD-10-CM

## 2021-10-04 DIAGNOSIS — Z889 Allergy status to unspecified drugs, medicaments and biological substances status: Secondary | ICD-10-CM

## 2021-10-04 DIAGNOSIS — N400 Enlarged prostate without lower urinary tract symptoms: Secondary | ICD-10-CM | POA: Diagnosis not present

## 2021-10-04 DIAGNOSIS — R030 Elevated blood-pressure reading, without diagnosis of hypertension: Secondary | ICD-10-CM | POA: Diagnosis not present

## 2021-10-04 DIAGNOSIS — Z23 Encounter for immunization: Secondary | ICD-10-CM | POA: Diagnosis not present

## 2021-10-04 LAB — CBC WITH DIFFERENTIAL/PLATELET
Basophils Absolute: 0 10*3/uL (ref 0.0–0.1)
Basophils Relative: 0.5 % (ref 0.0–3.0)
Eosinophils Absolute: 0.2 10*3/uL (ref 0.0–0.7)
Eosinophils Relative: 4.1 % (ref 0.0–5.0)
HCT: 38.9 % — ABNORMAL LOW (ref 39.0–52.0)
Hemoglobin: 12.5 g/dL — ABNORMAL LOW (ref 13.0–17.0)
Lymphocytes Relative: 17.2 % (ref 12.0–46.0)
Lymphs Abs: 0.9 10*3/uL (ref 0.7–4.0)
MCHC: 32.1 g/dL (ref 30.0–36.0)
MCV: 80.2 fl (ref 78.0–100.0)
Monocytes Absolute: 0.6 10*3/uL (ref 0.1–1.0)
Monocytes Relative: 11.2 % (ref 3.0–12.0)
Neutro Abs: 3.5 10*3/uL (ref 1.4–7.7)
Neutrophils Relative %: 67 % (ref 43.0–77.0)
Platelets: 350 10*3/uL (ref 150.0–400.0)
RBC: 4.85 Mil/uL (ref 4.22–5.81)
RDW: 17.7 % — ABNORMAL HIGH (ref 11.5–15.5)
WBC: 5.2 10*3/uL (ref 4.0–10.5)

## 2021-10-04 LAB — COMPREHENSIVE METABOLIC PANEL
ALT: 12 U/L (ref 0–53)
AST: 13 U/L (ref 0–37)
Albumin: 3.7 g/dL (ref 3.5–5.2)
Alkaline Phosphatase: 72 U/L (ref 39–117)
BUN: 17 mg/dL (ref 6–23)
CO2: 26 mEq/L (ref 19–32)
Calcium: 8.7 mg/dL (ref 8.4–10.5)
Chloride: 104 mEq/L (ref 96–112)
Creatinine, Ser: 0.97 mg/dL (ref 0.40–1.50)
GFR: 77.56 mL/min (ref >60.00)
Glucose, Bld: 90 mg/dL (ref 70–99)
Potassium: 4.8 mEq/L (ref 3.5–5.1)
Sodium: 138 mEq/L (ref 135–145)
Total Bilirubin: 0.7 mg/dL (ref 0.2–1.2)
Total Protein: 6.2 g/dL (ref 6.0–8.3)

## 2021-10-04 LAB — URINALYSIS
Bilirubin Urine: NEGATIVE
Hgb urine dipstick: NEGATIVE
Ketones, ur: NEGATIVE
Leukocytes,Ua: NEGATIVE
Nitrite: NEGATIVE
Specific Gravity, Urine: 1.02 (ref 1.000–1.030)
Total Protein, Urine: NEGATIVE
Urine Glucose: NEGATIVE
Urobilinogen, UA: 1 (ref 0.0–1.0)
pH: 6 (ref 5.0–8.0)

## 2021-10-04 LAB — PSA: PSA: 3.94 ng/mL (ref 0.10–4.00)

## 2021-10-04 LAB — SEDIMENTATION RATE: Sed Rate: 67 mm/hr — ABNORMAL HIGH (ref 0–20)

## 2021-10-04 LAB — C-REACTIVE PROTEIN: CRP: 8.4 mg/dL (ref 0.5–20.0)

## 2021-10-04 LAB — TSH: TSH: 1.37 u[IU]/mL (ref 0.35–5.50)

## 2021-10-04 MED ORDER — ZOLPIDEM TARTRATE 10 MG PO TABS: 10.0000 mg | ORAL_TABLET | Freq: Every evening | ORAL | 1 refills | Status: DC | PRN

## 2021-10-04 NOTE — Progress Notes (Addendum)
Subjective:  Patient ID: Chris Obrien, male    DOB: 04/25/1950  Age: 71 y.o. MRN: [id number]  CC: Annual Exam   HPI Chris Obrien presents for a well exam Follow-up on insomnia, recurrent infections, multiple drug intolerances  Outpatient Medications Prior to Visit  Medication Sig Dispense Refill   cefadroxil (DURICEF) 500 MG capsule TAKE ONE CAPSULE BY MOUTH TWICE A DAY 28 capsule 1   celecoxib (CELEBREX) 200 MG capsule Take 1 capsule (200 mg total) by mouth every 12 (twelve) hours. 180 capsule 3   Glucosamine-Chondroit-Vit C-Mn (GLUCOSAMINE CHONDR 1500 COMPLX) CAPS Take 1 capsule by mouth 2 (two) times daily.      Multiple Vitamin (MULTIVITAMIN WITH MINERALS) TABS Take 1 tablet by mouth every morning.      tamsulosin (FLOMAX) 0.4 MG CAPS capsule Take by mouth.     Glucosamine-Chondroitin 500-400 MG CAPS Take by mouth. (Patient not taking: Reported on 10/04/2021)     No facility-administered medications prior to visit.    ROS: Review of Systems  Constitutional:  Negative for appetite change, fatigue and unexpected weight change.  HENT:  Negative for congestion, nosebleeds, sneezing, sore throat and trouble swallowing.   Eyes:  Negative for itching and visual disturbance.  Respiratory:  Negative for cough.   Cardiovascular:  Negative for chest pain, palpitations and leg swelling.  Gastrointestinal:  Negative for abdominal distention, blood in stool, diarrhea and nausea.  Genitourinary:  Negative for frequency and hematuria.  Musculoskeletal:  Positive for arthralgias. Negative for back pain, gait problem, joint swelling and neck pain.  Skin:  Negative for rash.  Neurological:  Negative for dizziness, tremors, speech difficulty and weakness.  Psychiatric/Behavioral:  Negative for agitation, dysphoric mood and sleep disturbance. The patient is not nervous/anxious.     Objective:  BP (!) 144/82 (BP Location: Left Arm)   Pulse 68   Temp 98.3 F (36.8 C) (Oral)   Ht '6\' 1"'   (1.854 m)   Wt 241 lb 12.8 oz (109.7 kg)   SpO2 95%   BMI 31.90 kg/m   BP Readings from Last 3 Encounters:  10/04/21 (!) 144/82  05/08/21 (!) 142/88  04/27/21 134/72    Wt Readings from Last 3 Encounters:  10/04/21 241 lb 12.8 oz (109.7 kg)  05/08/21 246 lb (111.6 kg)  05/07/21 240 lb (108.9 kg)    Physical Exam Constitutional:      General: He is not in acute distress.    Appearance: Normal appearance. He is well-developed.     Comments: NAD  Eyes:     Conjunctiva/sclera: Conjunctivae normal.     Pupils: Pupils are equal, round, and reactive to light.  Neck:     Thyroid: No thyromegaly.     Vascular: No JVD.  Cardiovascular:     Rate and Rhythm: Normal rate and regular rhythm.     Heart sounds: Normal heart sounds. No murmur heard.    No friction rub. No gallop.  Pulmonary:     Effort: Pulmonary effort is normal. No respiratory distress.     Breath sounds: Normal breath sounds. No wheezing or rales.  Chest:     Chest wall: No tenderness.  Abdominal:     General: Bowel sounds are normal. There is no distension.     Palpations: Abdomen is soft. There is no mass.     Tenderness: There is no abdominal tenderness. There is no guarding or rebound.  Musculoskeletal:        General: No tenderness. Normal range  of motion.     Cervical back: Normal range of motion.  Lymphadenopathy:     Cervical: No cervical adenopathy.  Skin:    General: Skin is warm and dry.     Findings: No rash.  Neurological:     Mental Status: He is alert and oriented to person, place, and time.     Cranial Nerves: No cranial nerve deficit.     Motor: No abnormal muscle tone.     Coordination: Coordination normal.     Gait: Gait normal.     Deep Tendon Reflexes: Reflexes are normal and symmetric.  Psychiatric:        Behavior: Behavior normal.        Thought Content: Thought content normal.        Judgment: Judgment normal.   R shoulder w/decr ROM  Lab Results  Component Value Date   WBC  5.2 10/04/2021   HGB 12.5 (L) 10/04/2021   HCT 38.9 (L) 10/04/2021   PLT 350.0 10/04/2021   GLUCOSE 90 10/04/2021   CHOL 142 11/25/2019   TRIG 55.0 11/25/2019   HDL 43.00 11/25/2019   LDLDIRECT 130.9 12/08/2012   LDLCALC 88 11/25/2019   ALT 12 10/04/2021   AST 13 10/04/2021   NA 138 10/04/2021   K 4.8 10/04/2021   CL 104 10/04/2021   CREATININE 0.97 10/04/2021   BUN 17 10/04/2021   CO2 26 10/04/2021   TSH 1.37 10/04/2021   PSA 3.94 10/04/2021   INR 1.09 06/03/2012   HGBA1C 5.4 05/23/2020    DG FLUORO GUIDED NEEDLE PLC ASPIRATION/INJECTION LOC  Result Date: 10/11/2020 CLINICAL DATA:  Chronic right worse than left shoulder pain. Prior bilateral reverse total shoulder arthroplasties. Skin rash around the left shoulder since surgery. Bilateral shoulder aspiration requested. FLUOROSCOPY TIME:  Radiation Exposure Index (as provided by the fluoroscopic device): 3.9 mGy Fluoroscopy Time:  35 seconds Number of Acquired Images:  0 PROCEDURE: The risks and benefits of the procedure were discussed with the patient, and written informed consent was obtained. The patient stated no history of allergy to contrast media. A formal timeout procedure was performed with the patient according to departmental protocol. RIGHT SHOULDER: The patient was placed supine on the fluoroscopy table and the right glenohumeral joint was identified under fluoroscopy. The skin overlying the right glenohumeral joint was subsequently cleaned with Betadine and a sterile drape was placed over the area of interest. 2 ml 1% Lidocaine was used to anesthetize the skin around the needle insertion site. A 20 gauge spinal needle was inserted into the right glenohumeral joint under fluoroscopy. 11 ml clear synovial fluid was aspirated and sent to the laboratory for analysis. The needle was removed and hemostasis was achieved. LEFT SHOULDER: The patient was placed supine on the fluoroscopy table and the left glenohumeral joint was  identified under fluoroscopy. The skin overlying the left glenohumeral joint was subsequently cleaned with Betadine and a sterile drape was placed over the area of interest. 2 ml 1% Lidocaine was used to anesthetize the skin around the needle insertion site. A 20 gauge spinal needle was inserted into the left glenohumeral joint under fluoroscopy. 0.5 ml bloody synovial fluid was aspirated and sent to the laboratory for analysis. The needle was removed and hemostasis was achieved. The patient tolerated the procedure well without complication. IMPRESSION: Technically successful bilateral shoulder aspirations. Electronically Signed   By: Titus Dubin M.D.   On: 10/11/2020 13:42   DG FLUORO GUIDED NEEDLE PLC ASPIRATION/INJECTION LOC  Result  Date: 10/11/2020 CLINICAL DATA:  Chronic right worse than left shoulder pain. Prior bilateral reverse total shoulder arthroplasties. Skin rash around the left shoulder since surgery. Bilateral shoulder aspiration requested. FLUOROSCOPY TIME:  Radiation Exposure Index (as provided by the fluoroscopic device): 3.9 mGy Fluoroscopy Time:  35 seconds Number of Acquired Images:  0 PROCEDURE: The risks and benefits of the procedure were discussed with the patient, and written informed consent was obtained. The patient stated no history of allergy to contrast media. A formal timeout procedure was performed with the patient according to departmental protocol. RIGHT SHOULDER: The patient was placed supine on the fluoroscopy table and the right glenohumeral joint was identified under fluoroscopy. The skin overlying the right glenohumeral joint was subsequently cleaned with Betadine and a sterile drape was placed over the area of interest. 2 ml 1% Lidocaine was used to anesthetize the skin around the needle insertion site. A 20 gauge spinal needle was inserted into the right glenohumeral joint under fluoroscopy. 11 ml clear synovial fluid was aspirated and sent to the laboratory for  analysis. The needle was removed and hemostasis was achieved. LEFT SHOULDER: The patient was placed supine on the fluoroscopy table and the left glenohumeral joint was identified under fluoroscopy. The skin overlying the left glenohumeral joint was subsequently cleaned with Betadine and a sterile drape was placed over the area of interest. 2 ml 1% Lidocaine was used to anesthetize the skin around the needle insertion site. A 20 gauge spinal needle was inserted into the left glenohumeral joint under fluoroscopy. 0.5 ml bloody synovial fluid was aspirated and sent to the laboratory for analysis. The needle was removed and hemostasis was achieved. The patient tolerated the procedure well without complication. IMPRESSION: Technically successful bilateral shoulder aspirations. Electronically Signed   By: Titus Dubin M.D.   On: 10/11/2020 13:42    Assessment & Plan:   Problem List Items Addressed This Visit     Chronic left shoulder pain    Obtain CRP, ESR, CBC periodically Blue-Emu cream was recommended to use 2-3 times a day      Drug allergy    Multiple allergies were reviewed      Elevated blood pressure, situational    We will watch.      Well adult exam - Primary    We discussed age appropriate health related issues, including available/recomended screening tests and vaccinations. We discussed a need for adhering to healthy diet and exercise. Labs ordered. All questions were answered. CT Ca score - will schedule again Pt had a chest CT at his sister's 2009 ---Ca score was 0.  Rect per Urology q 12 mo Derm, Ophth  q 12 mo Cologuard due in 2024      Relevant Orders   PSA (Completed)   TSH (Completed)   Urinalysis (Completed)   Other Visit Diagnoses     Needs flu shot       Relevant Orders   Flu Vaccine QUAD High Dose(Fluad) (Completed)   Need for vaccination with 20-polyvalent pneumococcal conjugate vaccine       Relevant Orders   Pneumococcal conjugate vaccine 20-valent  (Prevnar 20) (Completed)         Meds ordered this encounter  Medications   zolpidem (AMBIEN) 10 MG tablet    Sig: Take 1 tablet (10 mg total) by mouth at bedtime as needed for sleep.    Dispense:  30 tablet    Refill:  1      Follow-up: Return in about 6  months (around 04/04/2022) for a follow-up visit.  Walker Kehr, MD

## 2021-10-04 NOTE — Assessment & Plan Note (Addendum)
We discussed age appropriate health related issues, including available/recomended screening tests and vaccinations. We discussed a need for adhering to healthy diet and exercise. Labs ordered. All questions were answered. CT Ca score - will schedule again Pt had a chest CT at his sister's 2009 ---Ca score was 0.  Rect per Urology q 12 mo Derm, Ophth  q 12 mo Cologuard due in 2024

## 2021-10-04 NOTE — Addendum Note (Signed)
Addended by: Earnstine Regal on: 10/04/2021 09:36 AM   Modules accepted: Orders

## 2021-10-05 ENCOUNTER — Other Ambulatory Visit: Payer: Self-pay | Admitting: Internal Medicine

## 2021-10-05 DIAGNOSIS — D649 Anemia, unspecified: Secondary | ICD-10-CM

## 2021-10-05 DIAGNOSIS — Z96612 Presence of left artificial shoulder joint: Secondary | ICD-10-CM

## 2021-10-05 LAB — IRON,TIBC AND FERRITIN PANEL
%SAT: 12 % (calc) — ABNORMAL LOW (ref 20–48)
Ferritin: 95 ng/mL (ref 24–380)
Iron: 36 ug/dL — ABNORMAL LOW (ref 50–180)
TIBC: 304 mcg/dL (calc) (ref 250–425)

## 2021-10-05 MED ORDER — FERROUS SULFATE 325 (65 FE) MG PO TABS: 325.0000 mg | ORAL_TABLET | Freq: Every day | ORAL | 6 refills | Status: DC

## 2021-10-07 NOTE — Assessment & Plan Note (Signed)
We will watch.

## 2021-10-07 NOTE — Assessment & Plan Note (Signed)
Multiple allergies were reviewed

## 2021-10-07 NOTE — Addendum Note (Signed)
Addended by: Cassandria Anger on: 10/07/2021 02:42 PM   Modules accepted: Level of Service

## 2021-10-07 NOTE — Assessment & Plan Note (Signed)
Obtain CRP, ESR, CBC periodically Blue-Emu cream was recommended to use 2-3 times a day

## 2022-01-28 ENCOUNTER — Encounter: Payer: Self-pay | Admitting: Internal Medicine

## 2022-01-28 DIAGNOSIS — R21 Rash and other nonspecific skin eruption: Secondary | ICD-10-CM

## 2022-01-28 DIAGNOSIS — L989 Disorder of the skin and subcutaneous tissue, unspecified: Secondary | ICD-10-CM

## 2022-01-29 ENCOUNTER — Telehealth: Payer: Self-pay | Admitting: Internal Medicine

## 2022-01-29 NOTE — Telephone Encounter (Signed)
Patient called about a rash that has been spreading, would like to know if Dr. [name] could see him sometime today, he went ahead and scheduled an appointment for tomorrow with Dr. Mitchel Honour, but asked that I get the message back to Dr. [name].

## 2022-01-30 ENCOUNTER — Encounter: Payer: Self-pay | Admitting: Emergency Medicine

## 2022-01-30 ENCOUNTER — Ambulatory Visit (INDEPENDENT_AMBULATORY_CARE_PROVIDER_SITE_OTHER): Payer: Medicare Other | Admitting: Emergency Medicine

## 2022-01-30 VITALS — BP 146/94 | HR 79 | Temp 98.1°F | Ht 73.0 in | Wt 245.4 lb

## 2022-01-30 DIAGNOSIS — L239 Allergic contact dermatitis, unspecified cause: Secondary | ICD-10-CM | POA: Insufficient documentation

## 2022-01-30 DIAGNOSIS — R21 Rash and other nonspecific skin eruption: Secondary | ICD-10-CM

## 2022-01-30 MED ORDER — METHYLPREDNISOLONE 4 MG PO TBPK: ORAL_TABLET | ORAL | 1 refills | Status: DC

## 2022-01-30 NOTE — Assessment & Plan Note (Signed)
Unknown trigger.  Differential diagnosis discussed. Patient wonders about nickel allergy. No signs of anaphylaxis. Will benefit from corticosteroid treatment Recommend to start Medrol Dosepak today. Will need referral to allergist. ED precautions given. May continue antihistamines as needed.

## 2022-01-30 NOTE — Progress Notes (Signed)
Chris Obrien 72 y.o.   Chief Complaint  Patient presents with   Acute Visit    Skin rash     HISTORY OF PRESENT ILLNESS: Acute problem visit today.  Patient of Dr. Cristie Hem Plotnikov. This is a 71 y.o. male complaining of itchy rash mostly to left chest area but also to extremities Has history of recurrent similar rashes No other associated symptoms No other complaints or medical concerns today.  HPI   Prior to Admission medications   Medication Sig Start Date End Date Taking? Authorizing Provider  cefadroxil (DURICEF) 500 MG capsule TAKE ONE CAPSULE BY MOUTH TWICE A DAY 06/06/21  Yes Plotnikov, Evie Lacks, MD  celecoxib (CELEBREX) 200 MG capsule Take 1 capsule (200 mg total) by mouth every 12 (twelve) hours. 05/08/21  Yes Plotnikov, Evie Lacks, MD  ferrous sulfate 325 (65 FE) MG tablet Take 1 tablet (325 mg total) by mouth daily. 10/05/21 10/05/22 Yes Plotnikov, Evie Lacks, MD  Glucosamine-Chondroit-Vit C-Mn (GLUCOSAMINE CHONDR 1500 COMPLX) CAPS Take 1 capsule by mouth 2 (two) times daily.    Yes [provider]  Multiple Vitamin (MULTIVITAMIN WITH MINERALS) TABS Take 1 tablet by mouth every morning.    Yes [provider]  tamsulosin (FLOMAX) 0.4 MG CAPS capsule Take by mouth. 02/04/21  Yes [provider]  zolpidem (AMBIEN) 10 MG tablet Take 1 tablet (10 mg total) by mouth at bedtime as needed for sleep. 10/04/21  Yes Plotnikov, Evie Lacks, MD    Allergies  Allergen Reactions   Amoxicillin Hives   Vancomycin Rash    Severe redness all over body with swollen ankles and hands   Tramadol Itching    2019 Shoulder post-op   Oxycodone Rash   Penicillins Itching and Rash    Patient Active Problem List   Diagnosis Date Noted   Chronic left shoulder pain 04/27/2021   Visit for suture removal 04/27/2021   Perennial allergic rhinitis 03/28/2021   Diarrhea 05/22/2020   Superficial postoperative wound infection 09/07/2018   Cellulitis 09/01/2018   S/P reverse  total shoulder arthroplasty, left 71/24/5809   Complications due to internal joint prosthesis, subsequent encounter 07/23/2018   Medication monitoring encounter 02/10/2018   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/13/2018   Drug reaction 12/03/2017   Drug allergy 10/15/2017   Trigger finger 10/15/2017   Neoplasm of uncertain behavior of skin 10/15/2017   BPH (benign prostatic hyperplasia) 10/15/2017   Rotator cuff tear arthropathy of right shoulder 03/03/2017   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   S/p left TKA 07/17/2015   Obstructive sleep apnea syndrome 04/28/2014   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   Benign paroxysmal positional vertigo 01/27/2014   Elevated blood pressure, situational 01/27/2014   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   Pain in joint, shoulder region 12/07/2012   S/P right TK revision 06/15/2012   Cervical radiculopathy 05/07/2012   Normocytic anemia 04/28/2012   S/P Right knee removal of prosthesis with antibiotic spacer 04/27/2012   Rash 04/25/2012   Expected blood loss anemia 01/29/2012   Obesity (BMI 30.0-34.9) 01/29/2012   S/P right TKA 01/28/2012   Well adult exam 09/24/2011   Knee pain 09/24/2011   POLYP, COLON 05/30/2007    Past Medical History:  Diagnosis Date   Acute reactive arthritis ([place]) per pcp note from 03-30-2012   right pre-patella---  secondary contact dermitis with poison ivy exposure 03-26-2012   Angio-edema    Arthritis    Benign neoplasm of colon    BPH (benign  prostatic hypertrophy)    hx small stream   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   GERD (gastroesophageal reflux disease)    rare    Insomnia, unspecified    Oligospermia    OSA on CPAP    PT LOST  WEIGHT - NO LONGER USING CPAP   Pain    RIGHT SHOULDER DELTOID MUSCLE ATROPHY - LOSS OF STRENGT VERY LIMITED RANGE OF MOTTION   Right knee skin infection    Sleep apnea    weight loss so no cpap now    Swelling of joint of right knee    Urticaria      Past Surgical History:  Procedure Laterality Date   ADENOIDECTOMY     COLONOSCOPY     EXCISIONAL TOTAL KNEE ARTHROPLASTY WITH ANTIBIOTIC SPACERS Right 04/27/2012   Procedure: EXCISIONAL TOTAL KNEE ARTHROPLASTY WITH ANTIBIOTIC SPACERS;  Surgeon: Mauri Pole, MD;  Location: [facility];  Service: Orthopedics;  Laterality: Right;  SAME DAY LABS   IRRIGATION AND DEBRIDEMENT KNEE Right 04/02/2012   Procedure: IRRIGATION AND DEBRIDEMENT RIGHT PRE-PATELLA  ;  Surgeon: Mauri Pole, MD;  Location: [facility];  Service: Orthopedics;  Laterality: Right;   KNEE ARTHROSCOPY Right aug 2012   POLYPECTOMY     SINOSCOPY     TONSILLECTOMY     TOTAL KNEE ARTHROPLASTY  01/28/2012   Procedure: TOTAL KNEE ARTHROPLASTY;  Surgeon: Mauri Pole, MD;  Location: [facility];  Service: Orthopedics;  Laterality: Right;   TOTAL KNEE ARTHROPLASTY Left 07/17/2015   Procedure: LEFT TOTAL KNEE ARTHROPLASTY;  Surgeon: Paralee Cancel, MD;  Location: [facility];  Service: Orthopedics;  Laterality: Left;   TOTAL KNEE REVISION Right 06/15/2012   Procedure: reimplantation of right total knee ;  Surgeon: Mauri Pole, MD;  Location: [facility];  Service: Orthopedics;  Laterality: Right;   TOTAL SHOULDER REPLACEMENT Bilateral    done in [place] Right 2008    Social History   Socioeconomic History   Marital status: Married    Spouse name: Not on file   Number of children: Not on file   Years of education: 16   Highest education level: Not on file  Occupational History   Occupation: Freight forwarder    Comment: Nature conservation officer   Occupation: MANAGER    Employer: [employer]  Tobacco Use   Smoking status: Former    Types: Cigars    Quit date: [place]    Years since quitting: 34.0   Smokeless tobacco: Never  Vaping Use   Vaping Use: Never used  Substance and Sexual Activity   Alcohol use: Yes    Comment: occasionally   Drug use: No   Sexual activity: Not on file  Other Topics Concern   Not on  file  Social History Narrative   ** Merged History Encounter **       Caffeine 4 cups avg daily, 4 cans soda.   Social Determinants of Health   Financial Resource Strain: Low Risk  (05/07/2021)   Overall Financial Resource Strain (CARDIA)    Difficulty of Paying Living Expenses: Not hard at all  Food Insecurity: No Food Insecurity (05/07/2021)   Hunger Vital Sign    Worried About Running Out of Food in the Last Year: Never true    Ran Out of Food in the Last Year: Never true  Transportation Needs: No Transportation Needs (05/07/2021)   PRAPARE - Hydrologist (Medical): No  Lack of Transportation (Non-Medical): No  Physical Activity: Sufficiently Active (05/07/2021)   Exercise Vital Sign    Days of Exercise per Week: 4 days    Minutes of Exercise per Session: 60 min  Stress: No Stress Concern Present (05/07/2021)   [place]    Feeling of Stress : Not at all  Social Connections: Moderately Integrated (05/07/2021)   Social Connection and Isolation Panel [NHANES]    Frequency of Communication with Friends and Family: Three times a week    Frequency of Social Gatherings with Friends and Family: Once a week    Attends Religious Services: More than 4 times per year    Active Member of [employer] or Organizations: No    Attends Archivist Meetings: Never    Marital Status: Married  Human resources officer Violence: Not At Risk (05/07/2021)   Humiliation, Afraid, Rape, and Kick questionnaire    Fear of Current or Ex-Partner: No    Emotionally Abused: No    Physically Abused: No    Sexually Abused: No    Family History  Problem Relation Age of Onset   Alzheimer's disease Mother    Arthritis Father    Diabetes Brother    Hyperlipidemia Other    Colon cancer Neg Hx    Colon polyps Neg Hx    Esophageal cancer Neg Hx    Stomach cancer Neg Hx    Rectal cancer Neg Hx      Review of Systems   Constitutional:  Negative for chills and fever.  HENT:  Negative for congestion and sore throat.   Respiratory: Negative.  Negative for cough and shortness of breath.   Cardiovascular: Negative.  Negative for chest pain and palpitations.  Gastrointestinal:  Negative for abdominal pain, diarrhea, nausea and vomiting.  Skin:  Positive for rash.  Neurological: Negative.  Negative for dizziness and headaches.  All other systems reviewed and are negative.  Today's Vitals   01/30/22 0804  Weight: 245 lb 6 oz (111.3 kg)  Height: '6\' 1"'$  (1.854 m)   Body mass index is 32.37 kg/m.  Today's Vitals   01/30/22 0804  BP: (!) 146/94  Pulse: 79  Temp: 98.1 F (36.7 C)  TempSrc: Oral  SpO2: 93%  Weight: 245 lb 6 oz (111.3 kg)  Height: '6\' 1"'$  (1.854 m)   Body mass index is 32.37 kg/m.  Physical Exam Vitals reviewed.  Constitutional:      Appearance: Normal appearance.  HENT:     Head: Normocephalic.  Eyes:     Extraocular Movements: Extraocular movements intact.  Cardiovascular:     Rate and Rhythm: Normal rate.  Pulmonary:     Effort: Pulmonary effort is normal.  Skin:    General: Skin is warm and dry.     Comments: Blistering rash to left chest area and faint erythematous rash to extremities  Neurological:     Mental Status: He is alert and oriented to person, place, and time.  Psychiatric:        Mood and Affect: Mood normal.        Behavior: Behavior normal.      ASSESSMENT & PLAN: A total of 32 minutes was spent with the patient and counseling/coordination of care regarding preparing for this visit, review of most recent office visit notes, review of chronic medical conditions under management, review of all medications, diagnosis of allergic dermatitis and need for corticosteroids systemic treatment, need for allergist evaluation, prognosis, documentation, and  need for follow-up  Problem List Items Addressed This Visit       Musculoskeletal and Integument   Rash and  nonspecific skin eruption    Acute on top of chronic skin rash. Inflammatory allergic acute reaction Will benefit from Medrol Dosepak Needs evaluation by allergist/maybe dermatologist. No signs of cellulitis at present time. Taking antibiotic anyway.      Relevant Medications   methylPREDNISolone (MEDROL DOSEPAK) 4 MG TBPK tablet   Other Relevant Orders   Ambulatory referral to Allergy   Allergic dermatitis - Primary    Unknown trigger.  Differential diagnosis discussed. Patient wonders about nickel allergy. No signs of anaphylaxis. Will benefit from corticosteroid treatment Recommend to start Medrol Dosepak today. Will need referral to allergist. ED precautions given. May continue antihistamines as needed.      Relevant Medications   methylPREDNISolone (MEDROL DOSEPAK) 4 MG TBPK tablet   Other Relevant Orders   Ambulatory referral to Allergy   Patient Instructions  Contact Dermatitis Dermatitis is when your skin becomes red, sore, and swollen.  Contact dermatitis happens when your body reacts to something that touches the skin. There are 2 types: Irritant contact dermatitis. This is when something bothers your skin, like soap. Allergic contact dermatitis. This is when your skin touches something you are allergic to, like poison ivy. What are the causes? Irritant contact dermatitis may be caused by: Makeup. Soaps. Detergents. Bleaches. Acids. Metals, like nickel. Allergic contact dermatitis may be caused by: Plants. Chemicals. Jewelry. Latex. Medicines. Preservatives. These are things added to products to help them last longer. There may be some in your clothes. What increases the risk? Having a job where you have to be near things that bother your skin. Having asthma or eczema. What are the signs or symptoms?  Dry or flaky skin. Redness. Cracks. Itching. Moderate symptoms of this condition include: Pain or a burning feeling. Blisters. Blood or clear  fluid coming from cracks in your skin. Swelling. This may be on your eyelids, mouth, or genitals. How is this treated? Your doctor will find out what is making your skin react. Then, you can protect your skin. You may need to use: Steroid creams, ointments, or medicines. Antibiotics or other ointments, if you have a skin infection. Lotion or medicines to help with itching. A bandage. Follow these instructions at home: Skin care Put moisturizer on your skin when it needs it. Put cool, wet cloths on your skin (cool compresses). Put a baking soda paste on your skin. Stir water into baking soda until it looks like a paste. Do not scratch your skin. Try not to have things rub up against your skin. Avoid tight clothing. Avoid using soaps, perfumes, and dyes. Check your skin every day for signs of infection. Check for: More redness, swelling, or pain. More fluid or blood. Warmth. Pus or a bad smell. Medicines Take or apply over-the-counter and prescription medicines only as told by your doctor. If you were prescribed antibiotics, take or apply them as told by your doctor. Do not stop using them even if you start to feel better. Bathing Take a bath with: Epsom salts. Baking soda. Colloidal oatmeal. Bathe less often. Bathe in warm water. Try not to use hot water. Bandage care If you were given a bandage, change it as told by your doctor. Wash your hands with soap and water for at least 20 seconds before and after you change your bandage. If you cannot use soap and water, use hand sanitizer. General instructions  Avoid the things that caused your reaction. If you don't know what caused it, keep a journal. Write down: What you eat. What skin products you use. What you drink. What you wear. Contact a doctor if: You do not get better with treatment. You get worse. You have signs of infection. You have a fever. You have new symptoms. Your bone or joint near the area hurts after the  skin has healed. Get help right away if: You see red streaks coming from the area. The area turns darker. You have trouble breathing. This information is not intended to replace advice given to you by your health care provider. Make sure you discuss any questions you have with your health care provider. Document Revised: 06/29/2021 Document Reviewed: 06/29/2021 Elsevier Patient Education  [place], MD [facility]

## 2022-01-30 NOTE — Telephone Encounter (Signed)
Patient called and said that the testing that he needs done needs to be done at [facility] referral will have to be done.  Please fax it to:  [phone].  He was told by his shoulder surgeon that he needs to be tested vancomycin allergies and nickel allergies.  Please call patient and let him know when referral is done.

## 2022-01-30 NOTE — Telephone Encounter (Signed)
Pt has appt w/ Dr. Mitchel Honour today.Marland KitchenJohny Obrien

## 2022-01-30 NOTE — Assessment & Plan Note (Signed)
Acute on top of chronic skin rash. Inflammatory allergic acute reaction Will benefit from Medrol Dosepak Needs evaluation by allergist/maybe dermatologist. No signs of cellulitis at present time. Taking antibiotic anyway.

## 2022-01-30 NOTE — Patient Instructions (Signed)
Contact Dermatitis Dermatitis is when your skin becomes red, sore, and swollen.  Contact dermatitis happens when your body reacts to something that touches the skin. There are 2 types: Irritant contact dermatitis. This is when something bothers your skin, like soap. Allergic contact dermatitis. This is when your skin touches something you are allergic to, like poison ivy. What are the causes? Irritant contact dermatitis may be caused by: Makeup. Soaps. Detergents. Bleaches. Acids. Metals, like nickel. Allergic contact dermatitis may be caused by: Plants. Chemicals. Jewelry. Latex. Medicines. Preservatives. These are things added to products to help them last longer. There may be some in your clothes. What increases the risk? Having a job where you have to be near things that bother your skin. Having asthma or eczema. What are the signs or symptoms?  Dry or flaky skin. Redness. Cracks. Itching. Moderate symptoms of this condition include: Pain or a burning feeling. Blisters. Blood or clear fluid coming from cracks in your skin. Swelling. This may be on your eyelids, mouth, or genitals. How is this treated? Your doctor will find out what is making your skin react. Then, you can protect your skin. You may need to use: Steroid creams, ointments, or medicines. Antibiotics or other ointments, if you have a skin infection. Lotion or medicines to help with itching. A bandage. Follow these instructions at home: Skin care Put moisturizer on your skin when it needs it. Put cool, wet cloths on your skin (cool compresses). Put a baking soda paste on your skin. Stir water into baking soda until it looks like a paste. Do not scratch your skin. Try not to have things rub up against your skin. Avoid tight clothing. Avoid using soaps, perfumes, and dyes. Check your skin every day for signs of infection. Check for: More redness, swelling, or pain. More fluid or blood. Warmth. Pus or  a bad smell. Medicines Take or apply over-the-counter and prescription medicines only as told by your doctor. If you were prescribed antibiotics, take or apply them as told by your doctor. Do not stop using them even if you start to feel better. Bathing Take a bath with: Epsom salts. Baking soda. Colloidal oatmeal. Bathe less often. Bathe in warm water. Try not to use hot water. Bandage care If you were given a bandage, change it as told by your doctor. Wash your hands with soap and water for at least 20 seconds before and after you change your bandage. If you cannot use soap and water, use hand sanitizer. General instructions Avoid the things that caused your reaction. If you don't know what caused it, keep a journal. Write down: What you eat. What skin products you use. What you drink. What you wear. Contact a doctor if: You do not get better with treatment. You get worse. You have signs of infection. You have a fever. You have new symptoms. Your bone or joint near the area hurts after the skin has healed. Get help right away if: You see red streaks coming from the area. The area turns darker. You have trouble breathing. This information is not intended to replace advice given to you by your health care provider. Make sure you discuss any questions you have with your health care provider. Document Revised: 06/29/2021 Document Reviewed: 06/29/2021 Elsevier Patient Education  2023 Elsevier Inc.  

## 2022-01-30 NOTE — Telephone Encounter (Signed)
Pt saw Dr Mitchel Honour today will forward msg to him for referral..../lmb

## 2022-01-30 NOTE — Telephone Encounter (Signed)
Thanks

## 2022-01-31 NOTE — Telephone Encounter (Signed)
Order was put in for [facility] not [facility]. Will cancel order for [place] and place [facility].Marland KitchenJohny Chess

## 2022-01-31 NOTE — Addendum Note (Signed)
Addended by: Earnstine Regal on: 01/31/2022 11:10 AM   Modules accepted: Orders

## 2022-02-06 NOTE — Addendum Note (Signed)
Addended by: Earnstine Regal on: 02/06/2022 04:28 PM   Modules accepted: Orders

## 2022-02-07 NOTE — Addendum Note (Signed)
Addended by: Earnstine Regal on: 02/07/2022 09:31 AM   Modules accepted: Orders

## 2022-02-14 NOTE — Telephone Encounter (Signed)
Need OV faxed over to [facility] [place].Marland KitchenJohny Obrien

## 2022-02-17 ENCOUNTER — Other Ambulatory Visit: Payer: Self-pay | Admitting: Internal Medicine

## 2022-02-17 DIAGNOSIS — R21 Rash and other nonspecific skin eruption: Secondary | ICD-10-CM

## 2022-02-17 DIAGNOSIS — L239 Allergic contact dermatitis, unspecified cause: Secondary | ICD-10-CM

## 2022-02-17 MED ORDER — METHYLPREDNISOLONE 4 MG PO TBPK: ORAL_TABLET | ORAL | 1 refills | Status: DC

## 2022-03-04 ENCOUNTER — Ambulatory Visit: Payer: Medicare Other | Admitting: Family Medicine

## 2022-03-04 NOTE — Patient Instructions (Incomplete)
Follow-up Note  RE: ARVIN RAUB MRN: [id number] DOB: 06/29/1950 Date of Office Visit: '@ENCDATE'$ @  Primary care provider: Cassandria Anger, MD Referring provider: Cassandria Anger, MD   Jorje returns to the office today for the patch test placement, given suspected history of contact dermatitis.    Diagnostics: metal patches placed.  Metals Patch     Time Antigen Placed      Location     Number of Test     Reading Interval  Day    Chromium chloride 1%  0     Cobalt chloride hexahydrate 1%  0     Molybdenum chloride 0.5%  0     Nickel sulfate hexahydrate 5%  0     Potassium dichromate 0.25%  0     Copper sulfate pentahydrate 2%  0     Titanium 0.1%  0     Manganese chloride 0.5%  0     Tantal 1%  0    Vanadium pentoxide 10%  0   Aluminum hydroxide 10%  0    Comments  ***   Discussed with patient that patch testing tests for contact dermatitis and sometimes it does not correlate to how one will react to metals in the body. Positive patch testing results can help in avoiding those items however it is possible to get false negative results.  Nevertheless, this is the most accessible test for metal sensitivity currently available.  Metal Patches placed today. Please avoid strenuous physical activities and do not get the patches on the back wet. No showering until final patch reading done. Okay to take antihistamines for itching but avoid placing any creams on the back where the patches are. We will remove the patches on Wednesday and will do our initial read. Then you will come back on Friday for a final read   Plan:   Allergic contact dermatitis - Instructions provided on care of the patches for the next 48 hours. Shanon Brow was instructed to avoid showering for the next 48 hours. Acencion Toni will follow up in 48 hours and 96 hours for patch readings.

## 2022-03-04 NOTE — Progress Notes (Deleted)
   [place] [address] Dept: [phone]  FOLLOW UP NOTE  Patient ID: MESHACH NEZAT, male    DOB: Jun 26, 1950  Age: 71 y.o. MRN: [id number] Date of Office Visit: 03/04/2022  Assessment  Chief Complaint: No chief complaint on file.  HPI HARLAND DECRANE is a 72 year old male who presents to the clinic for follow-up visit mental patch testing.  He was last seen in this clinic on 04/23/2021 by Althea Charon for evaluation of rash and allergic rhinitis.  His last environmental allergy testing was on 03/28/2021 and was positive to indoor and outdoor molds, cats, and dogs.  On 04/18/2021 he had skin testing to penicillin which was positive and skin testing was stopped.   Drug Allergies:  Allergies  Allergen Reactions   Amoxicillin Hives   Vancomycin Rash    Severe redness all over body with swollen ankles and hands   Tramadol Itching    2019 Shoulder post-op   Oxycodone Rash   Penicillins Itching and Rash    Physical Exam: There were no vitals taken for this visit.   Physical Exam  Diagnostics:    Assessment and Plan: No diagnosis found.  No orders of the defined types were placed in this encounter.   There are no Patient Instructions on file for this visit.  No follow-ups on file.    Thank you for the opportunity to care for this patient.  Please do not hesitate to contact me with questions.  Gareth Morgan, FNP [facility]

## 2022-05-09 ENCOUNTER — Telehealth: Payer: Self-pay

## 2022-05-09 NOTE — Telephone Encounter (Signed)
Contacted Chris Obrien to schedule their annual wellness visit. Appointment made for 05/14/22.

## 2022-05-14 ENCOUNTER — Encounter: Payer: Self-pay | Admitting: Internal Medicine

## 2022-05-14 ENCOUNTER — Telehealth: Payer: Self-pay

## 2022-05-14 ENCOUNTER — Ambulatory Visit (INDEPENDENT_AMBULATORY_CARE_PROVIDER_SITE_OTHER): Payer: Medicare Other

## 2022-05-14 VITALS — BP 141/81 | HR 70 | Ht 73.0 in | Wt 239.0 lb

## 2022-05-14 DIAGNOSIS — Z1211 Encounter for screening for malignant neoplasm of colon: Secondary | ICD-10-CM

## 2022-05-14 DIAGNOSIS — Z0000 Encounter for general adult medical examination without abnormal findings: Secondary | ICD-10-CM

## 2022-05-14 NOTE — Progress Notes (Addendum)
I connected with  Chris Obrien on 05/14/22 by a audio enabled telemedicine application and verified that I am speaking with the correct person using two identifiers.  Patient Location: Home  Provider Location: Office/Clinic  I discussed the limitations of evaluation and management by telemedicine. The patient expressed understanding and agreed to proceed.  Patient Medicare AWV questionnaire was completed by the patient on 05/10/2022; I have confirmed that all information answered by patient is correct and no changes since this date.    Subjective:   Chris Obrien is a 72 y.o. male who presents for Medicare Annual/Subsequent preventive examination.  Review of Systems     Cardiac Risk Factors include: advanced age (>67men, >53 women);male gender     Objective:    Today's Vitals   05/14/22 1411 05/14/22 1412  BP: (!) 141/81   Pulse: 70   Weight: 239 lb (108.4 kg)   Height: 6\' 1"  (1.854 m)   PainSc: 3  3   PainLoc: Shoulder    Body mass index is 31.53 kg/m.     05/14/2022    2:16 PM 05/07/2021    1:18 PM 07/17/2015   10:50 AM 07/05/2015    8:10 AM 06/15/2012   11:15 AM 06/03/2012    8:38 AM 04/28/2012    1:00 AM  Advanced Directives  Does Patient Have a Medical Advance Directive? Yes No Yes Yes Patient has advance directive, copy not in chart Patient has advance directive, copy not in chart Patient has advance directive, copy not in chart  Type of Advance Directive Healthcare Power of [place];Living will  Healthcare Power of [place];Living will Healthcare Power of [place];Living will Healthcare Power of [place];Living will Healthcare Power of [place];Living will Healthcare Power of [place];Living will  Does patient want to make changes to medical advance directive?   No - Patient declined No - Patient declined     Copy of Healthcare Power of Attorney in Chart? No - copy requested  Yes Yes Copy requested from family  Copy requested from family  Would patient like information  on creating a medical advance directive?  No - Patient declined       Pre-existing out of facility DNR order (yellow form or pink MOST form)     No  No    Current Medications (verified) Outpatient Encounter Medications as of 05/14/2022  Medication Sig   cefadroxil (DURICEF) 500 MG capsule TAKE ONE CAPSULE BY MOUTH TWICE A DAY   celecoxib (CELEBREX) 200 MG capsule Take 1 capsule (200 mg total) by mouth every 12 (twelve) hours.   ferrous sulfate 325 (65 FE) MG tablet Take 1 tablet (325 mg total) by mouth daily.   Glucosamine-Chondroit-Vit C-Mn (GLUCOSAMINE CHONDR 1500 COMPLX) CAPS Take 1 capsule by mouth 2 (two) times daily.    Multiple Vitamin (MULTIVITAMIN WITH MINERALS) TABS Take 1 tablet by mouth every morning.    tamsulosin (FLOMAX) 0.4 MG CAPS capsule Take by mouth.   zolpidem (AMBIEN) 10 MG tablet Take 1 tablet (10 mg total) by mouth at bedtime as needed for sleep.   [DISCONTINUED] methylPREDNISolone (MEDROL DOSEPAK) 4 MG TBPK tablet Sig as indicated   No facility-administered encounter medications on file as of 05/14/2022.    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   GERD (gastroesophageal reflux disease)    rare    Insomnia, unspecified    Oligospermia    OSA on CPAP    PT LOST  WEIGHT - NO LONGER USING CPAP   Pain    RIGHT SHOULDER DELTOID MUSCLE ATROPHY - LOSS OF STRENGT VERY LIMITED RANGE OF MOTTION   Right knee skin infection    Sleep apnea    weight loss so no cpap now    Swelling of joint of right knee    Urticaria    Past Surgical History:  Procedure Laterality Date    ADENOIDECTOMY     COLONOSCOPY     EXCISIONAL TOTAL KNEE ARTHROPLASTY WITH ANTIBIOTIC SPACERS Right 04/27/2012   Procedure: EXCISIONAL TOTAL KNEE ARTHROPLASTY WITH ANTIBIOTIC SPACERS;  Surgeon: [name], MD;  Location: [facility];  Service: Orthopedics;  Laterality: Right;  SAME DAY LABS   IRRIGATION AND DEBRIDEMENT KNEE Right 04/02/2012   Procedure: IRRIGATION AND DEBRIDEMENT RIGHT PRE-PATELLA  ;  Surgeon: [name], MD;  Location: [facility];  Service: Orthopedics;  Laterality: Right;   KNEE ARTHROSCOPY Right aug 2012   POLYPECTOMY     SINOSCOPY     TONSILLECTOMY     TOTAL KNEE ARTHROPLASTY  01/28/2012   Procedure: TOTAL KNEE ARTHROPLASTY;  Surgeon: [name], MD;  Location: [facility];  Service: Orthopedics;  Laterality: Right;   TOTAL KNEE ARTHROPLASTY Left 07/17/2015   Procedure: LEFT TOTAL KNEE ARTHROPLASTY;  Surgeon: Durene Romans, MD;  Location: [facility];  Service: Orthopedics;  Laterality: Left;   TOTAL KNEE REVISION Right 06/15/2012   Procedure: reimplantation of right total knee ;  Surgeon: [name], MD;  Location: [facility];  Service: Orthopedics;  Laterality: Right;   TOTAL SHOULDER REPLACEMENT Bilateral    done in [place]    ULNAR NERVE REPAIR Right 2008   Family History  Problem Relation Age of Onset   Alzheimer's disease Mother    Arthritis Father    Diabetes Brother    Hyperlipidemia Other    Colon cancer Neg Hx    Colon polyps Neg Hx    Esophageal cancer Neg Hx    Stomach cancer Neg Hx    Rectal cancer Neg Hx    Social History   Socioeconomic History   Marital status: Married    Spouse name: Not on file   Number of children: Not on file   Years of education: 16   Highest education level: Not on file  Occupational History   Occupation: Production designer, theatre/television/film    Comment: Marketing executive   Occupation: MANAGER    Employer: [employer]  Tobacco Use   Smoking status: Former    Types: Cigars    Quit date: 1990    Years since quitting: 34.3    Smokeless tobacco: Never  Vaping Use   Vaping Use: Never used  Substance and Sexual Activity   Alcohol use: Yes    Comment: occasionally   Drug use: No   Sexual activity: Not on file  Other Topics Concern   Not on file  Social History Narrative   ** Merged History Encounter **       Caffeine 4 cups avg daily, 4 cans soda.   Social Determinants of Health   Financial Resource Strain: Low Risk  (05/14/2022)   Overall Financial Resource Strain (CARDIA)  Difficulty of Paying Living Expenses: Not hard at all  Food Insecurity: No Food Insecurity (05/14/2022)   Hunger Vital Sign    Worried About Running Out of Food in the Last Year: Never true    Ran Out of Food in the Last Year: Never true  Transportation Needs: No Transportation Needs (05/14/2022)   PRAPARE - Administrator, Civil Service (Medical): No    Lack of Transportation (Non-Medical): No  Physical Activity: Sufficiently Active (05/14/2022)   Exercise Vital Sign    Days of Exercise per Week: 7 days    Minutes of Exercise per Session: 40 min  Stress: No Stress Concern Present (05/14/2022)   [employer] of Occupational Health - Occupational Stress Questionnaire    Feeling of Stress : Not at all  Social Connections: Unknown (05/14/2022)   Social Connection and Isolation Panel [NHANES]    Frequency of Communication with Friends and Family: Twice a week    Frequency of Social Gatherings with Friends and Family: Once a week    Attends Religious Services: Not on Marketing executive or Organizations: Yes    Attends Engineer, structural: More than 4 times per year    Marital Status: Married    Tobacco Counseling Counseling given: Not Answered   Clinical Intake:  Pre-visit preparation completed: Yes  Pain : 0-10 Pain Score: 3  Pain Type: Neuropathic pain Pain Location: Shoulder Pain Orientation: Right     BMI - recorded: 31.53 Nutritional Status: BMI > 30  Obese Nutritional Risks:  None Diabetes: No  How often do you need to have someone help you when you read instructions, pamphlets, or other written materials from your doctor or pharmacy?: 1 - Never What is the last grade level you completed in school?: HSG  Diabetic? No  Interpreter Needed?: No  Information entered by :: Susie Cassette, LPN.   Activities of Daily Living    05/14/2022    2:16 PM 05/10/2022   12:04 PM  In your present state of health, do you have any difficulty performing the following activities:  Hearing? 0 0  Vision? 0 0  Difficulty concentrating or making decisions? 0 0  Walking or climbing stairs? 0 0  Dressing or bathing? 0 0  Doing errands, shopping? 0 0  Preparing Food and eating ? N N  Using the Toilet? N N  In the past six months, have you accidently leaked urine? Y Y  Do you have problems with loss of bowel control? N N  Managing your Medications? N N  Managing your Finances? N N  Housekeeping or managing your Housekeeping? N N    Patient Care Team: Plotnikov, Georgina Quint, MD as PCP - General Durene Romans, MD as Consulting Physician (Orthopedic Surgery) Leretha Pol, MD as Referring Physician (Orthopedic Surgery) Comer, Belia Heman, MD as Consulting Physician (Infectious Diseases)  Indicate any recent Medical Services you may have received from other than [facility] providers in the past year (date may be approximate).     Assessment:   This is a routine wellness examination for [place].  Hearing/Vision screen Hearing Screening - Comments:: Denies hearing difficulties   Vision Screening - Comments:: Wears rx glasses - up to date with routine eye exams with [facility]   Dietary issues and exercise activities discussed: Current Exercise Habits: Home exercise routine, Type of exercise: walking;treadmill;stretching;strength training/weights, Time (Minutes): 40, Frequency (Times/Week): 7, Weekly Exercise (Minutes/Week): 280, Intensity: Moderate, Exercise  limited by:  None identified   Goals Addressed             This Visit's Progress    My healthcare goal for 2024 is to maintain my current health status by continuing to eat healthy, stay independent, physically and socially active.        Depression Screen    05/14/2022    2:15 PM 01/30/2022    8:05 AM 05/07/2021    1:16 PM 06/16/2020    2:03 PM 09/10/2018    8:51 AM 09/07/2018    9:54 AM 09/01/2018    9:55 AM  PHQ 2/9 Scores  PHQ - 2 Score 0 0 0 0 0 0 0  PHQ- 9 Score 0          Fall Risk    05/14/2022    2:16 PM 05/10/2022   12:04 PM 01/30/2022    8:05 AM 05/07/2021    1:18 PM 06/16/2020    2:03 PM  Fall Risk   Falls in the past year? 0 0 0 0 0  Number falls in past yr: 0 0 0 0 0  Injury with Fall? 0 0 0 0 0  Risk for fall due to : No Fall Risks  No Fall Risks No Fall Risks   Follow up Falls prevention discussed  Falls evaluation completed Falls evaluation completed Falls evaluation completed    FALL RISK PREVENTION PERTAINING TO THE HOME:  Any stairs in or around the home? Yes  If so, are there any without handrails? No  Home free of loose throw rugs in walkways, pet beds, electrical cords, etc? Yes  Adequate lighting in your home to reduce risk of falls? Yes   ASSISTIVE DEVICES UTILIZED TO PREVENT FALLS:  Life alert? Yes Smart Watch Use of a cane, walker or w/c? No  Grab bars in the bathroom? No  Shower chair or bench in shower? No  Elevated toilet seat or a handicapped toilet? Yes   TIMED UP AND GO:  Was the test performed? No . Telephonic Visit  Cognitive Function:        05/14/2022    2:18 PM  6CIT Screen  What Year? 0 points  What month? 0 points  What time? 0 points  Count back from 20 0 points  Months in reverse 0 points  Repeat phrase 0 points  Total Score 0 points    Immunizations Immunization History  Administered Date(s) Administered   Fluad Quad(high Dose 65+) 11/25/2019, 10/04/2021   Influenza Split 09/23/2011   Influenza, High Dose Seasonal  PF 10/26/2016, 10/15/2017   Influenza,inj,Quad PF,6+ Mos 12/07/2012, 12/17/2013, 09/10/2018   Influenza-Unspecified 09/02/2018, 01/24/2021   Moderna Sars-Covid-2 Vaccination 01/26/2019, 02/22/2019, 11/08/2019, 11/17/2020   PNEUMOCOCCAL CONJUGATE-20 10/04/2021   Pneumococcal Conjugate-13 06/12/2015   Pneumococcal Polysaccharide-23 12/21/2013   Td 01/08/2008, 10/08/2019   Zoster Recombinat (Shingrix) 08/13/2016, 10/18/2016   Zoster, Live 12/17/2013    TDAP status: Up to date  Flu Vaccine status: Up to date  Pneumococcal vaccine status: Up to date  Covid-19 vaccine status: Completed vaccines  Qualifies for Shingles Vaccine? Yes   Zostavax completed Yes   Shingrix Completed?: Yes  Screening Tests Health Maintenance  Topic Date Due   COLONOSCOPY (Pts 45-54yrs Insurance coverage will need to be confirmed)  04/20/2019   COVID-19 Vaccine (5 - 2023-24 season) 09/07/2021   INFLUENZA VACCINE  08/08/2022   Medicare Annual Wellness (AWV)  05/14/2023   DTaP/Tdap/Td (3 - Tdap) 10/07/2029   Pneumonia Vaccine 108+ Years old  Completed  Hepatitis C Screening  Completed   Zoster Vaccines- Shingrix  Completed   HPV VACCINES  Aged Out    Health Maintenance  Health Maintenance Due  Topic Date Due   COLONOSCOPY (Pts 45-67yrs Insurance coverage will need to be confirmed)  04/20/2019   COVID-19 Vaccine (5 - 2023-24 season) 09/07/2021    Colorectal cancer screening: Type of screening: Cologuard. Completed 12/09/2019. Repeat every 3 years  Lung Cancer Screening: (Low Dose CT Chest recommended if Age 78-80 years, 30 pack-year currently smoking OR have quit w/in 15years.) does not qualify.   Lung Cancer Screening Referral: No  Additional Screening:  Hepatitis C Screening: does qualify; Completed: 06/13/2015  Vision Screening: Recommended annual ophthalmology exams for early detection of glaucoma and other disorders of the eye. Is the patient up to date with their annual eye exam?  Yes  Who  is the provider or what is the name of the office in which the patient attends annual eye exams? [facility] If pt is not established with a provider, would they like to be referred to a provider to establish care? No .   Dental Screening: Recommended annual dental exams for proper oral hygiene  Community Resource Referral / Chronic Care Management: CRR required this visit?  No   CCM required this visit?  No      Plan:     I have personally reviewed and noted the following in the patient's chart:   Medical and social history Use of alcohol, tobacco or illicit drugs  Current medications and supplements including opioid prescriptions. Patient is not currently taking opioid prescriptions. Functional ability and status Nutritional status Physical activity Advanced directives List of other physicians Hospitalizations, surgeries, and ER visits in previous 12 months Vitals Screenings to include cognitive, depression, and falls Referrals and appointments  In addition, I have reviewed and discussed with patient certain preventive protocols, quality metrics, and best practice recommendations. A written personalized care plan for preventive services as well as general preventive health recommendations were provided to patient.     Mickeal Needy, LPN   01/12/1094   Nurse Notes:  Patient is cogitatively intact. Patient provided vitals for this visit. Patient stated that she has no issues with gait or balance; does not use any assistive devices. Medications reviewed with patient; no opioid use noted.  Medical screening examination/treatment/procedure(s) were performed by non-physician practitioner and as supervising physician I was immediately available for consultation/collaboration.  I agree with above. Jacinta Shoe, MD

## 2022-05-14 NOTE — Patient Instructions (Signed)
Chris Obrien , Thank you for taking time to come for your Medicare Wellness Visit. I appreciate your ongoing commitment to your health goals. Please review the following plan we discussed and let me know if I can assist you in the future.   These are the goals we discussed:  Goals      My healthcare goal for 2024 is to maintain my current health status by continuing to eat healthy, stay independent, physically and socially active.        This is a list of the screening recommended for you and due dates:  Health Maintenance  Topic Date Due   Colon Cancer Screening  04/20/2019   COVID-19 Vaccine (5 - 2023-24 season) 09/07/2021   Flu Shot  08/08/2022   Medicare Annual Wellness Visit  05/14/2023   DTaP/Tdap/Td vaccine (3 - Tdap) 10/07/2029   Pneumonia Vaccine  Completed   Hepatitis C Screening: USPSTF Recommendation to screen - Ages 82-79 yo.  Completed   Zoster (Shingles) Vaccine  Completed   HPV Vaccine  Aged Out    Advanced directives: YES  Conditions/risks identified: YES  Next appointment: Follow up in one year for your annual wellness visit.   Preventive Care 7 Years and Older, Male  Preventive care refers to lifestyle choices and visits with your health care provider that can promote health and wellness. What does preventive care include? A yearly physical exam. This is also called an annual well check. Dental exams once or twice a year. Routine eye exams. Ask your health care provider how often you should have your eyes checked. Personal lifestyle choices, including: Daily care of your teeth and gums. Regular physical activity. Eating a healthy diet. Avoiding tobacco and drug use. Limiting alcohol use. Practicing safe sex. Taking low doses of aspirin every day. Taking vitamin and mineral supplements as recommended by your health care provider. What happens during an annual well check? The services and screenings done by your health care provider during your annual  well check will depend on your age, overall health, lifestyle risk factors, and family history of disease. Counseling  Your health care provider may ask you questions about your: Alcohol use. Tobacco use. Drug use. Emotional well-being. Home and relationship well-being. Sexual activity. Eating habits. History of falls. Memory and ability to understand (cognition). Work and work Astronomer. Screening  You may have the following tests or measurements: Height, weight, and BMI. Blood pressure. Lipid and cholesterol levels. These may be checked every 5 years, or more frequently if you are over 39 years old. Skin check. Lung cancer screening. You may have this screening every year starting at age 55 if you have a 30-pack-year history of smoking and currently smoke or have quit within the past 15 years. Fecal occult blood test (FOBT) of the stool. You may have this test every year starting at age 69. Flexible sigmoidoscopy or colonoscopy. You may have a sigmoidoscopy every 5 years or a colonoscopy every 10 years starting at age 96. Prostate cancer screening. Recommendations will vary depending on your family history and other risks. Hepatitis C blood test. Hepatitis B blood test. Sexually transmitted disease (STD) testing. Diabetes screening. This is done by checking your blood sugar (glucose) after you have not eaten for a while (fasting). You may have this done every 1-3 years. Abdominal aortic aneurysm (AAA) screening. You may need this if you are a current or former smoker. Osteoporosis. You may be screened starting at age 41 if you are at high risk.  Talk with your health care provider about your test results, treatment options, and if necessary, the need for more tests. Vaccines  Your health care provider may recommend certain vaccines, such as: Influenza vaccine. This is recommended every year. Tetanus, diphtheria, and acellular pertussis (Tdap, Td) vaccine. You may need a Td booster  every 10 years. Zoster vaccine. You may need this after age 45. Pneumococcal 13-valent conjugate (PCV13) vaccine. One dose is recommended after age 71. Pneumococcal polysaccharide (PPSV23) vaccine. One dose is recommended after age 23. Talk to your health care provider about which screenings and vaccines you need and how often you need them. This information is not intended to replace advice given to you by your health care provider. Make sure you discuss any questions you have with your health care provider. Document Released: 01/20/2015 Document Revised: 09/13/2015 Document Reviewed: 10/25/2014 Elsevier Interactive Patient Education  2017 [employer] Prevention in the Home Falls can cause injuries. They can happen to people of all ages. There are many things you can do to make your home safe and to help prevent falls. What can I do on the outside of my home? Regularly fix the edges of walkways and driveways and fix any cracks. Remove anything that might make you trip as you walk through a door, such as a raised step or threshold. Trim any bushes or trees on the path to your home. Use bright outdoor lighting. Clear any walking paths of anything that might make someone trip, such as rocks or tools. Regularly check to see if handrails are loose or broken. Make sure that both sides of any steps have handrails. Any raised decks and porches should have guardrails on the edges. Have any leaves, snow, or ice cleared regularly. Use sand or salt on walking paths during winter. Clean up any spills in your garage right away. This includes oil or grease spills. What can I do in the bathroom? Use night lights. Install grab bars by the toilet and in the tub and shower. Do not use towel bars as grab bars. Use non-skid mats or decals in the tub or shower. If you need to sit down in the shower, use a plastic, non-slip stool. Keep the floor dry. Clean up any water that spills on the floor as soon  as it happens. Remove soap buildup in the tub or shower regularly. Attach bath mats securely with double-sided non-slip rug tape. Do not have throw rugs and other things on the floor that can make you trip. What can I do in the bedroom? Use night lights. Make sure that you have a light by your bed that is easy to reach. Do not use any sheets or blankets that are too big for your bed. They should not hang down onto the floor. Have a firm chair that has side arms. You can use this for support while you get dressed. Do not have throw rugs and other things on the floor that can make you trip. What can I do in the kitchen? Clean up any spills right away. Avoid walking on wet floors. Keep items that you use a lot in easy-to-reach places. If you need to reach something above you, use a strong step stool that has a grab bar. Keep electrical cords out of the way. Do not use floor polish or wax that makes floors slippery. If you must use wax, use non-skid floor wax. Do not have throw rugs and other things on the floor that can make  you trip. What can I do with my stairs? Do not leave any items on the stairs. Make sure that there are handrails on both sides of the stairs and use them. Fix handrails that are broken or loose. Make sure that handrails are as long as the stairways. Check any carpeting to make sure that it is firmly attached to the stairs. Fix any carpet that is loose or worn. Avoid having throw rugs at the top or bottom of the stairs. If you do have throw rugs, attach them to the floor with carpet tape. Make sure that you have a light switch at the top of the stairs and the bottom of the stairs. If you do not have them, ask someone to add them for you. What else can I do to help prevent falls? Wear shoes that: Do not have high heels. Have rubber bottoms. Are comfortable and fit you well. Are closed at the toe. Do not wear sandals. If you use a stepladder: Make sure that it is fully  opened. Do not climb a closed stepladder. Make sure that both sides of the stepladder are locked into place. Ask someone to hold it for you, if possible. Clearly mark and make sure that you can see: Any grab bars or handrails. First and last steps. Where the edge of each step is. Use tools that help you move around (mobility aids) if they are needed. These include: Canes. Walkers. Scooters. Crutches. Turn on the lights when you go into a dark area. Replace any light bulbs as soon as they burn out. Set up your furniture so you have a clear path. Avoid moving your furniture around. If any of your floors are uneven, fix them. If there are any pets around you, be aware of where they are. Review your medicines with your doctor. Some medicines can make you feel dizzy. This can increase your chance of falling. Ask your doctor what other things that you can do to help prevent falls. This information is not intended to replace advice given to you by your health care provider. Make sure you discuss any questions you have with your health care provider. Document Released: 10/20/2008 Document Revised: 06/01/2015 Document Reviewed: 01/28/2014 Elsevier Interactive Patient Education  2017 [employer].

## 2022-05-14 NOTE — Telephone Encounter (Signed)
Patient would like to have blood work done to check his iron levels.

## 2022-05-14 NOTE — Telephone Encounter (Signed)
She should have orders active since September.  Thank you

## 2022-07-08 ENCOUNTER — Encounter: Payer: Self-pay | Admitting: Internal Medicine

## 2022-07-08 ENCOUNTER — Ambulatory Visit (INDEPENDENT_AMBULATORY_CARE_PROVIDER_SITE_OTHER): Payer: Medicare Other | Admitting: Internal Medicine

## 2022-07-08 VITALS — BP 160/86 | HR 83 | Temp 98.2°F | Ht 73.0 in | Wt 246.0 lb

## 2022-07-08 DIAGNOSIS — T50905S Adverse effect of unspecified drugs, medicaments and biological substances, sequela: Secondary | ICD-10-CM

## 2022-07-08 DIAGNOSIS — R21 Rash and other nonspecific skin eruption: Secondary | ICD-10-CM | POA: Diagnosis not present

## 2022-07-08 DIAGNOSIS — R202 Paresthesia of skin: Secondary | ICD-10-CM | POA: Diagnosis not present

## 2022-07-08 DIAGNOSIS — R61 Generalized hyperhidrosis: Secondary | ICD-10-CM | POA: Diagnosis not present

## 2022-07-08 DIAGNOSIS — E559 Vitamin D deficiency, unspecified: Secondary | ICD-10-CM

## 2022-07-08 DIAGNOSIS — L03114 Cellulitis of left upper limb: Secondary | ICD-10-CM

## 2022-07-08 LAB — CBC WITH DIFFERENTIAL/PLATELET
Basophils Absolute: 0.1 10*3/uL (ref 0.0–0.1)
Basophils Relative: 1.7 % (ref 0.0–3.0)
Eosinophils Absolute: 0.1 10*3/uL (ref 0.0–0.7)
Eosinophils Relative: 3.4 % (ref 0.0–5.0)
HCT: 38 % — ABNORMAL LOW (ref 39.0–52.0)
Hemoglobin: 12.1 g/dL — ABNORMAL LOW (ref 13.0–17.0)
Lymphocytes Relative: 25 % (ref 12.0–46.0)
Lymphs Abs: 1.1 10*3/uL (ref 0.7–4.0)
MCHC: 31.7 g/dL (ref 30.0–36.0)
MCV: 78.7 fl (ref 78.0–100.0)
Monocytes Absolute: 0.6 10*3/uL (ref 0.1–1.0)
Monocytes Relative: 14.4 % — ABNORMAL HIGH (ref 3.0–12.0)
Neutro Abs: 2.4 10*3/uL (ref 1.4–7.7)
Neutrophils Relative %: 55.5 % (ref 43.0–77.0)
Platelets: 355 10*3/uL (ref 150.0–400.0)
RBC: 4.83 Mil/uL (ref 4.22–5.81)
RDW: 16.3 % — ABNORMAL HIGH (ref 11.5–15.5)
WBC: 4.3 10*3/uL (ref 4.0–10.5)

## 2022-07-08 LAB — TESTOSTERONE: Testosterone: 297.89 ng/dL — ABNORMAL LOW (ref 300.00–890.00)

## 2022-07-08 LAB — COMPREHENSIVE METABOLIC PANEL
ALT: 12 U/L (ref 0–53)
AST: 15 U/L (ref 0–37)
Albumin: 3.7 g/dL (ref 3.5–5.2)
Alkaline Phosphatase: 66 U/L (ref 39–117)
BUN: 26 mg/dL — ABNORMAL HIGH (ref 6–23)
CO2: 26 mEq/L (ref 19–32)
Calcium: 8.9 mg/dL (ref 8.4–10.5)
Chloride: 104 mEq/L (ref 96–112)
Creatinine, Ser: 1 mg/dL (ref 0.40–1.50)
GFR: 74.38 mL/min (ref >60.00)
Glucose, Bld: 92 mg/dL (ref 70–99)
Potassium: 4.3 mEq/L (ref 3.5–5.1)
Sodium: 139 mEq/L (ref 135–145)
Total Bilirubin: 0.4 mg/dL (ref 0.2–1.2)
Total Protein: 7 g/dL (ref 6.0–8.3)

## 2022-07-08 LAB — VITAMIN B12: Vitamin B-12: 326 pg/mL (ref 211–911)

## 2022-07-08 LAB — C-REACTIVE PROTEIN: CRP: 11.9 mg/dL (ref 0.5–20.0)

## 2022-07-08 LAB — T4, FREE: Free T4: 0.91 ng/dL (ref 0.60–1.60)

## 2022-07-08 LAB — TSH: TSH: 1.32 u[IU]/mL (ref 0.35–5.50)

## 2022-07-08 LAB — SEDIMENTATION RATE: Sed Rate: 80 mm/hr — ABNORMAL HIGH (ref 0–20)

## 2022-07-08 LAB — VITAMIN D 25 HYDROXY (VIT D DEFICIENCY, FRACTURES): VITD: 31.47 ng/mL (ref 30.00–100.00)

## 2022-07-08 MED ORDER — MOMETASONE FUROATE 0.1 % EX OINT: TOPICAL_OINTMENT | Freq: Every day | CUTANEOUS | 0 refills | Status: DC

## 2022-07-08 NOTE — Progress Notes (Signed)
Subjective:  Patient ID: Chris Obrien, male    DOB: [birth year]  Age: 72 y.o. MRN: [id number]  CC: Annual Exam (No concerns )   HPI Chris Obrien presents for drenching sweats at night x 1 year Rash around left shoulder is controlled with Elocon ointment prescribed by [facility] dermatology.  Chris Obrien thinks he is allergic to metals, therefore he keeps having reactions to the prosthesis. He had multiple patch test with [facility] dermatology.  Allergen avoidance strategy was recommended  Outpatient Medications Prior to Visit  Medication Sig Dispense Refill   cefadroxil (DURICEF) 500 MG capsule TAKE ONE CAPSULE BY MOUTH TWICE A DAY 28 capsule 1   celecoxib (CELEBREX) 200 MG capsule Take 1 capsule (200 mg total) by mouth every 12 (twelve) hours. 180 capsule 3   ferrous sulfate 325 (65 FE) MG tablet Take 1 tablet (325 mg total) by mouth daily. 30 tablet 6   Glucosamine-Chondroit-Vit C-Mn (GLUCOSAMINE CHONDR 1500 COMPLX) CAPS Take 1 capsule by mouth 2 (two) times daily.      Multiple Vitamin (MULTIVITAMIN WITH MINERALS) TABS Take 1 tablet by mouth every morning.      tamsulosin (FLOMAX) 0.4 MG CAPS capsule Take by mouth.     zolpidem (AMBIEN) 10 MG tablet Take 1 tablet (10 mg total) by mouth at bedtime as needed for sleep. 30 tablet 1   No facility-administered medications prior to visit.    ROS: Review of Systems  Objective:  BP (!) 160/86 (BP Location: Left Arm, Patient Position: Sitting, Cuff Size: Normal)   Pulse 83   Temp 98.2 F (36.8 C) (Oral)   Ht 6\' 1"  (1.854 m)   Wt 246 lb (111.6 kg)   SpO2 97%   BMI 32.46 kg/m   BP Readings from Last 3 Encounters:  07/08/22 (!) 160/86  05/14/22 (!) 141/81  01/30/22 (!) 146/94    Wt Readings from Last 3 Encounters:  07/08/22 246 lb (111.6 kg)  05/14/22 239 lb (108.4 kg)  01/30/22 245 lb 6 oz (111.3 kg)    Physical Exam  Lab Results  Component Value Date   WBC 5.2 10/04/2021   HGB 12.5 (L) 10/04/2021   HCT 38.9 (L) 10/04/2021    PLT 350.0 10/04/2021   GLUCOSE 90 10/04/2021   CHOL 142 11/25/2019   TRIG 55.0 11/25/2019   HDL 43.00 11/25/2019   LDLDIRECT 130.9 12/08/2012   LDLCALC 88 11/25/2019   ALT 12 10/04/2021   AST 13 10/04/2021   NA 138 10/04/2021   K 4.8 10/04/2021   CL 104 10/04/2021   CREATININE 0.97 10/04/2021   BUN 17 10/04/2021   CO2 26 10/04/2021   TSH 1.37 10/04/2021   PSA 3.94 10/04/2021   INR 1.09 06/03/2012   HGBA1C 5.4 05/23/2020    DG FLUORO GUIDED NEEDLE PLC ASPIRATION/INJECTION LOC  Result Date: 10/11/2020 CLINICAL DATA:  Chronic right worse than left shoulder pain. Prior bilateral reverse total shoulder arthroplasties. Skin rash around the left shoulder since surgery. Bilateral shoulder aspiration requested. FLUOROSCOPY TIME:  Radiation Exposure Index (as provided by the fluoroscopic device): 3.9 mGy Fluoroscopy Time:  35 seconds Number of Acquired Images:  0 PROCEDURE: The risks and benefits of the procedure were discussed with the patient, and written informed consent was obtained. The patient stated no history of allergy to contrast media. A formal timeout procedure was performed with the patient according to departmental protocol. RIGHT SHOULDER: The patient was placed supine on the fluoroscopy table and the right glenohumeral joint was identified  under fluoroscopy. The skin overlying the right glenohumeral joint was subsequently cleaned with Betadine and a sterile drape was placed over the area of interest. 2 ml 1% Lidocaine was used to anesthetize the skin around the needle insertion site. A 20 gauge spinal needle was inserted into the right glenohumeral joint under fluoroscopy. 11 ml clear synovial fluid was aspirated and sent to the laboratory for analysis. The needle was removed and hemostasis was achieved. LEFT SHOULDER: The patient was placed supine on the fluoroscopy table and the left glenohumeral joint was identified under fluoroscopy. The skin overlying the left glenohumeral joint was  subsequently cleaned with Betadine and a sterile drape was placed over the area of interest. 2 ml 1% Lidocaine was used to anesthetize the skin around the needle insertion site. A 20 gauge spinal needle was inserted into the left glenohumeral joint under fluoroscopy. 0.5 ml bloody synovial fluid was aspirated and sent to the laboratory for analysis. The needle was removed and hemostasis was achieved. The patient tolerated the procedure well without complication. IMPRESSION: Technically successful bilateral shoulder aspirations. Electronically Signed   By: Obie Dredge M.D.   On: 10/11/2020 13:42   DG FLUORO GUIDED NEEDLE PLC ASPIRATION/INJECTION LOC  Result Date: 10/11/2020 CLINICAL DATA:  Chronic right worse than left shoulder pain. Prior bilateral reverse total shoulder arthroplasties. Skin rash around the left shoulder since surgery. Bilateral shoulder aspiration requested. FLUOROSCOPY TIME:  Radiation Exposure Index (as provided by the fluoroscopic device): 3.9 mGy Fluoroscopy Time:  35 seconds Number of Acquired Images:  0 PROCEDURE: The risks and benefits of the procedure were discussed with the patient, and written informed consent was obtained. The patient stated no history of allergy to contrast media. A formal timeout procedure was performed with the patient according to departmental protocol. RIGHT SHOULDER: The patient was placed supine on the fluoroscopy table and the right glenohumeral joint was identified under fluoroscopy. The skin overlying the right glenohumeral joint was subsequently cleaned with Betadine and a sterile drape was placed over the area of interest. 2 ml 1% Lidocaine was used to anesthetize the skin around the needle insertion site. A 20 gauge spinal needle was inserted into the right glenohumeral joint under fluoroscopy. 11 ml clear synovial fluid was aspirated and sent to the laboratory for analysis. The needle was removed and hemostasis was achieved. LEFT SHOULDER: The  patient was placed supine on the fluoroscopy table and the left glenohumeral joint was identified under fluoroscopy. The skin overlying the left glenohumeral joint was subsequently cleaned with Betadine and a sterile drape was placed over the area of interest. 2 ml 1% Lidocaine was used to anesthetize the skin around the needle insertion site. A 20 gauge spinal needle was inserted into the left glenohumeral joint under fluoroscopy. 0.5 ml bloody synovial fluid was aspirated and sent to the laboratory for analysis. The needle was removed and hemostasis was achieved. The patient tolerated the procedure well without complication. IMPRESSION: Technically successful bilateral shoulder aspirations. Electronically Signed   By: Obie Dredge M.D.   On: 10/11/2020 13:42    Assessment & Plan:   Problem List Items Addressed This Visit     Rash and nonspecific skin eruption - Primary    [name] oint  Rash around left shoulder is controlled with Elocon ointment prescribed by [facility] dermatology.  Narin thinks he is allergic to metals, therefore he keeps having reactions to the prosthesis. He had multiple patch test with [facility] dermatology.  Allergen avoidance strategy was recommended  Relevant Orders   CBC with Differential/Platelet   Comprehensive metabolic panel   C-reactive protein   Vitamin B12   VITAMIN D 25 Hydroxy (Vit-D Deficiency, Fractures)   Urinalysis   TSH   T4, free   Testosterone   Sedimentation rate   Drug reaction   Relevant Orders   CBC with Differential/Platelet   Comprehensive metabolic panel   C-reactive protein   Vitamin B12   VITAMIN D 25 Hydroxy (Vit-D Deficiency, Fractures)   Urinalysis   TSH   T4, free   Testosterone   Sedimentation rate   Cellulitis    No relapse      Night sweats    Worse.  Check labs including sed rate, CRP, testosterone Hold Tamsulosin for a few nights      Relevant Orders   CBC with Differential/Platelet   Comprehensive metabolic  panel   C-reactive protein   Vitamin B12   VITAMIN D 25 Hydroxy (Vit-D Deficiency, Fractures)   Urinalysis   TSH   T4, free   Testosterone   Sedimentation rate   Other Visit Diagnoses     Vitamin D deficiency       Relevant Orders   VITAMIN D 25 Hydroxy (Vit-D Deficiency, Fractures)   Paresthesias       Relevant Orders   Vitamin B12         Meds ordered this encounter  Medications   mometasone (ELOCON) 0.1 % ointment    Sig: Apply topically daily.    Dispense:  45 g    Refill:  0      Follow-up: Return in about 6 months (around 01/08/2023) for a follow-up visit.  Sonda Primes, MD

## 2022-07-08 NOTE — Assessment & Plan Note (Addendum)
[name] oint  Rash around left shoulder is controlled with Elocon ointment prescribed by [facility] dermatology.  Demaury thinks he is allergic to metals, therefore he keeps having reactions to the prosthesis. He had multiple patch test with [facility] dermatology.  Allergen avoidance strategy was recommended

## 2022-07-08 NOTE — Assessment & Plan Note (Addendum)
Worse.  Check labs including sed rate, CRP, testosterone Hold Tamsulosin for a few nights

## 2022-07-09 ENCOUNTER — Encounter: Payer: Self-pay | Admitting: Internal Medicine

## 2022-07-09 LAB — URINALYSIS, ROUTINE W REFLEX MICROSCOPIC
Bilirubin Urine: NEGATIVE
Ketones, ur: NEGATIVE
Leukocytes,Ua: NEGATIVE
Nitrite: NEGATIVE
Specific Gravity, Urine: 1.025 (ref 1.000–1.030)
Total Protein, Urine: NEGATIVE
Urine Glucose: NEGATIVE
Urobilinogen, UA: 0.2 (ref 0.0–1.0)
pH: 6 (ref 5.0–8.0)

## 2022-07-09 NOTE — Assessment & Plan Note (Signed)
No relapse 

## 2022-07-10 ENCOUNTER — Other Ambulatory Visit: Payer: Self-pay | Admitting: Internal Medicine

## 2022-07-10 DIAGNOSIS — R7989 Other specified abnormal findings of blood chemistry: Secondary | ICD-10-CM

## 2022-07-10 DIAGNOSIS — R61 Generalized hyperhidrosis: Secondary | ICD-10-CM

## 2022-07-10 MED ORDER — VITAMIN D3 50 MCG (2000 UT) PO CAPS: 4000.0000 [IU] | ORAL_CAPSULE | Freq: Every day | ORAL | 3 refills | Status: AC

## 2022-07-10 MED ORDER — NEPHRO-VITE RX 1 MG PO TABS: 1.0000 | ORAL_TABLET | Freq: Every day | ORAL | 3 refills | Status: AC

## 2022-07-12 ENCOUNTER — Telehealth: Payer: Self-pay | Admitting: Internal Medicine

## 2022-07-12 DIAGNOSIS — G8929 Other chronic pain: Secondary | ICD-10-CM

## 2022-07-12 NOTE — Telephone Encounter (Signed)
Prescription Request  07/12/2022  LOV: 07/08/2022  What is the name of the medication or equipment? cefadroxil (DURICEF) 500 MG capsule   Have you contacted your pharmacy to request a refill? Yes   Which pharmacy would you like this sent to?  [facility] [address] [place], [place] - [address] AT [address] [place]. [place] [address] Phone: [phone] Fax: [phone]    Patient notified that their request is being sent to the clinical staff for review and that they should receive a response within 2 business days.   Please advise at Mobile [phone] (mobile)

## 2022-07-14 MED ORDER — CEFADROXIL 500 MG PO CAPS: 500.0000 mg | ORAL_CAPSULE | Freq: Two times a day (BID) | ORAL | 1 refills | Status: DC

## 2022-07-14 NOTE — Telephone Encounter (Signed)
Okay.  Thanks.

## 2022-07-21 IMAGING — XA DG FLUORO GUIDE NDL PLC/BX
2 series · 2 of 2 positions shown · non-contrast
Comparison: none

CLINICAL DATA: Chronic right worse than left shoulder pain. Prior
bilateral reverse total shoulder arthroplasties. Skin rash around
the left shoulder since surgery. Bilateral shoulder aspiration
requested.

[Series 1: ortho standard · 1 of 1 slices shown (1 of 2)]
[im 1/1]
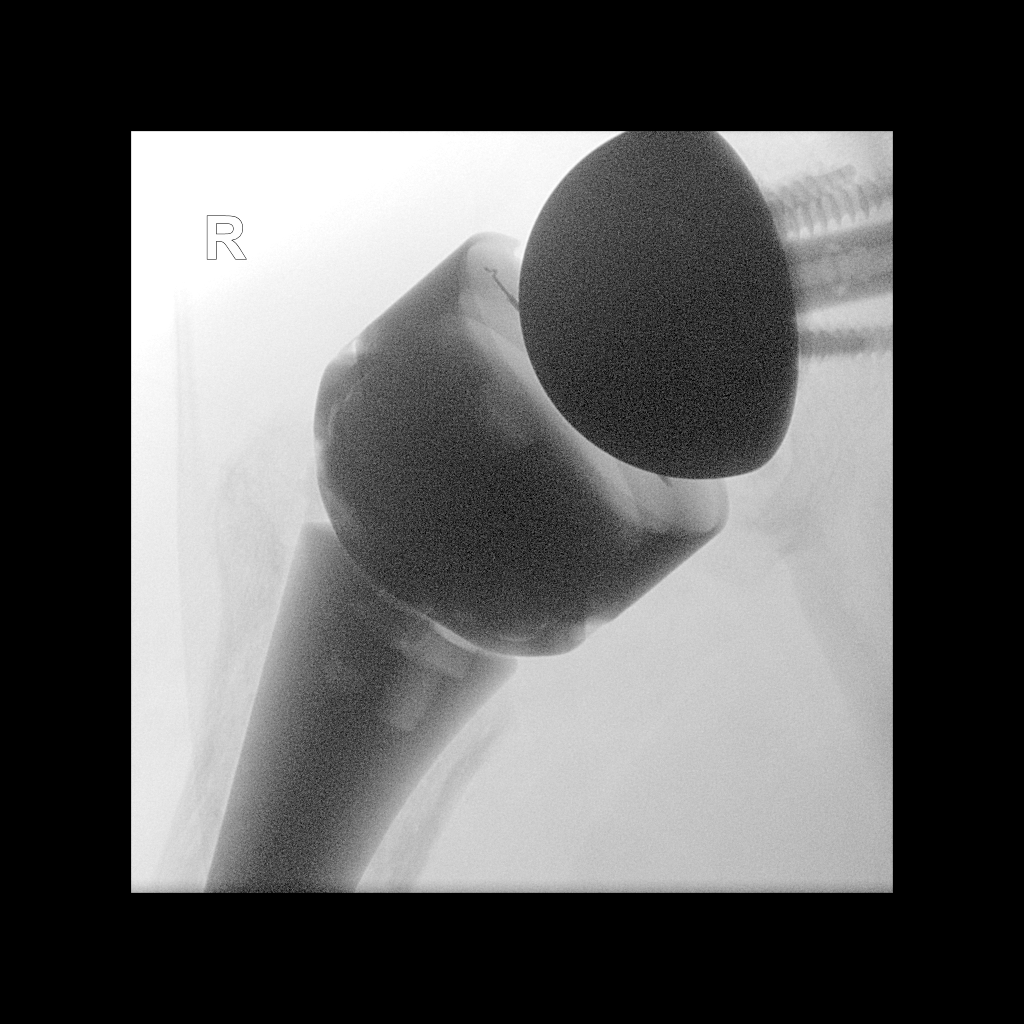

[Series 2: ortho standard · 1 of 1 slices shown (2 of 2)]
[im 1/1]
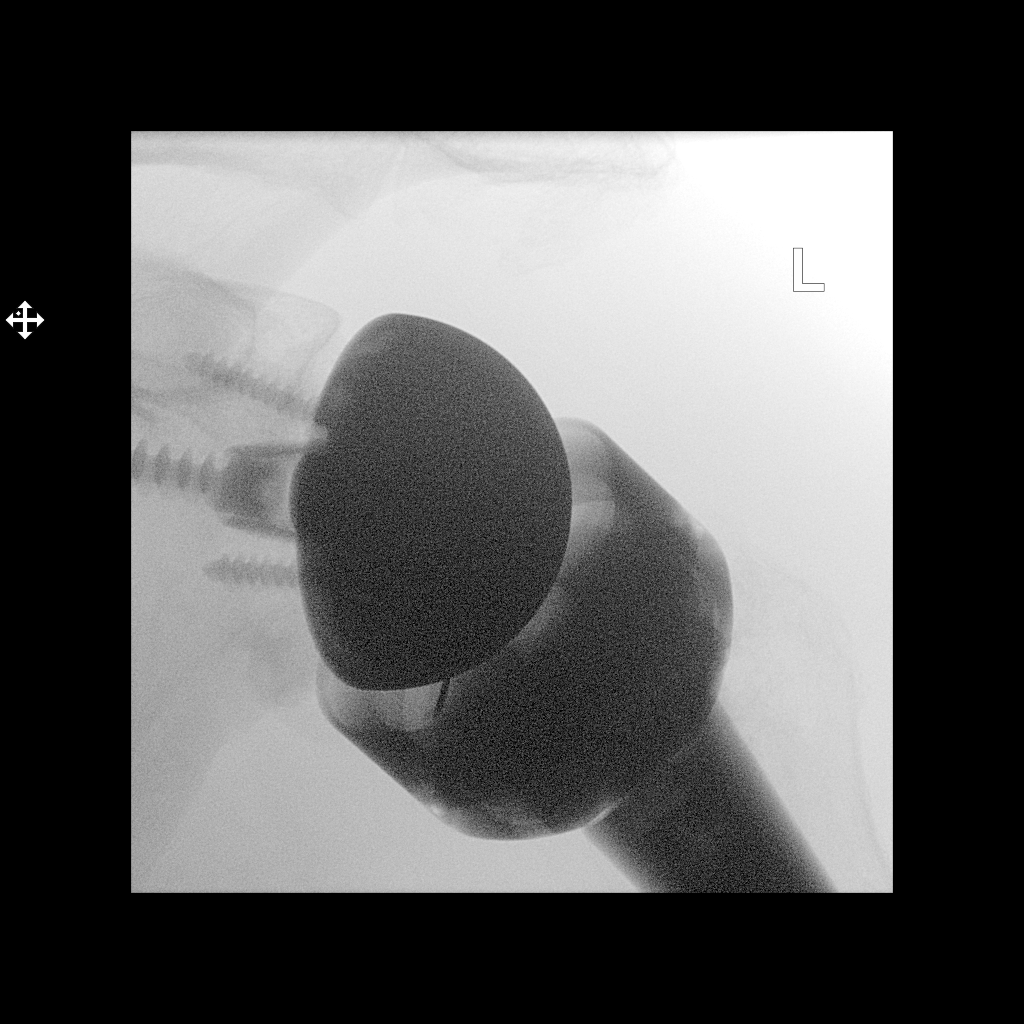

[2 of 2 positions shown; findings below may reference images not displayed]

FLUOROSCOPY TIME:  Radiation Exposure Index (as provided by the
fluoroscopic device): 3.9 mGy

Fluoroscopy Time:  35 seconds

Number of Acquired Images:  0

PROCEDURE:
The risks and benefits of the procedure were discussed with the
patient, and written informed consent was obtained. The patient
stated no history of allergy to contrast media. A formal timeout
procedure was performed with the patient according to departmental
protocol.

RIGHT SHOULDER:

The patient was placed supine on the fluoroscopy table and the right
glenohumeral joint was identified under fluoroscopy. The skin
overlying the right glenohumeral joint was subsequently cleaned with
Betadine and a sterile drape was placed over the area of interest. 2
ml 1% Lidocaine was used to anesthetize the skin around the needle
insertion site.

A 20 gauge spinal needle was inserted into the right glenohumeral
joint under fluoroscopy.

11 ml clear synovial fluid was aspirated and sent to the laboratory
for analysis.

The needle was removed and hemostasis was achieved.

LEFT SHOULDER:

The patient was placed supine on the fluoroscopy table and the left
glenohumeral joint was identified under fluoroscopy. The skin
overlying the left glenohumeral joint was subsequently cleaned with
Betadine and a sterile drape was placed over the area of interest. 2
ml 1% Lidocaine was used to anesthetize the skin around the needle
insertion site.

A 20 gauge spinal needle was inserted into the left glenohumeral
joint under fluoroscopy.

0.5 ml bloody synovial fluid was aspirated and sent to the
laboratory for analysis.

The needle was removed and hemostasis was achieved.

The patient tolerated the procedure well without complication.
IMPRESSION: Technically successful bilateral shoulder aspirations.

## 2022-07-21 IMAGING — XA DG FLUORO GUIDE NDL PLC/BX
2 series · 2 of 2 positions shown · non-contrast
Comparison: none

CLINICAL DATA: Chronic right worse than left shoulder pain. Prior
bilateral reverse total shoulder arthroplasties. Skin rash around
the left shoulder since surgery. Bilateral shoulder aspiration
requested.

[Series 1: ortho standard · 1 of 1 slices shown (1 of 2)]
[im 1/1]
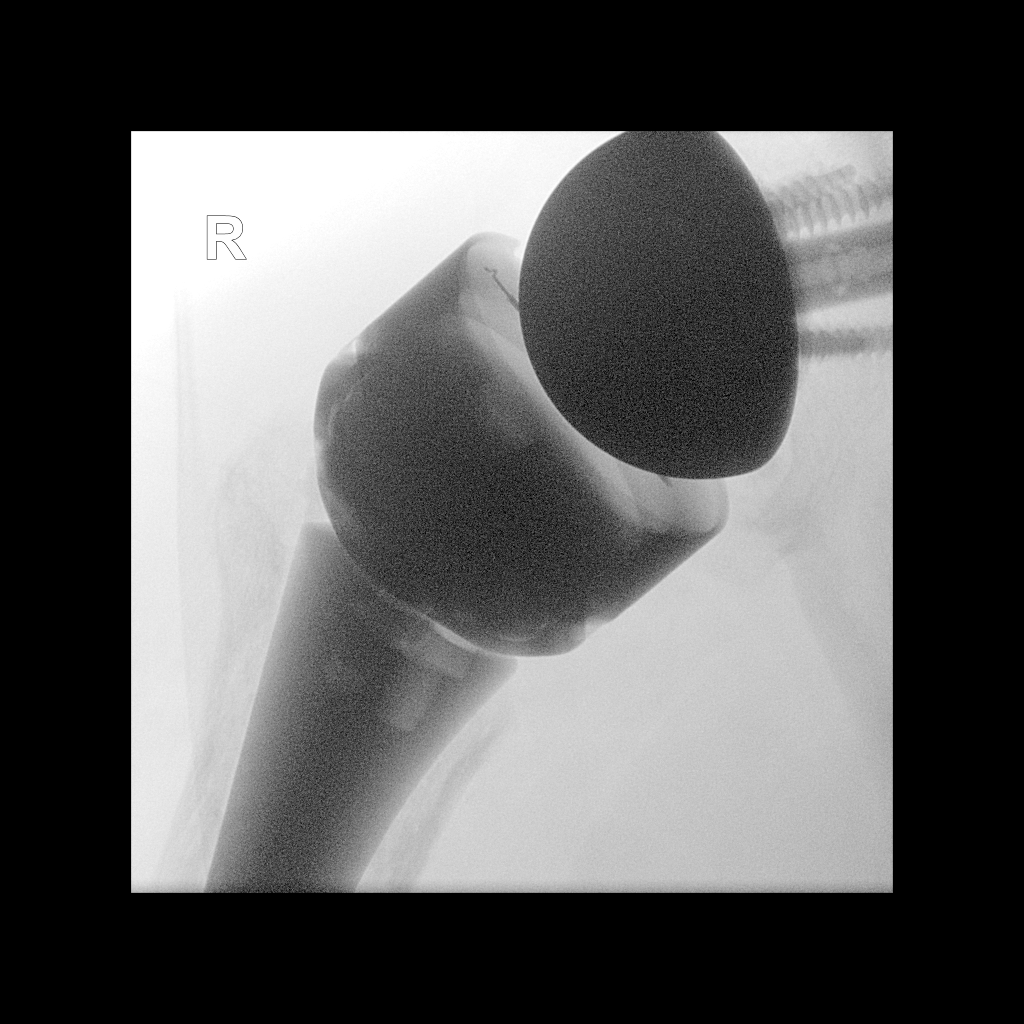

[Series 2: ortho standard · 1 of 1 slices shown (2 of 2)]
[im 1/1]
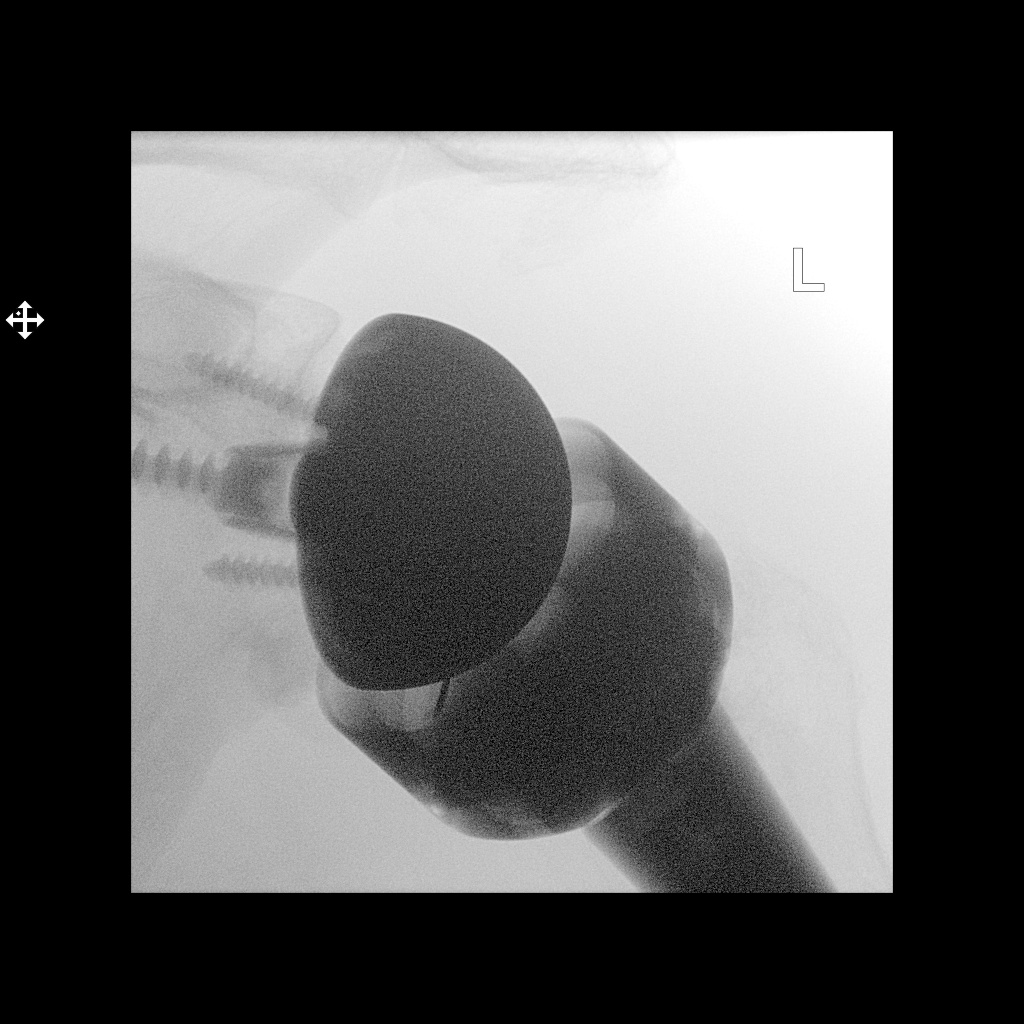

[2 of 2 positions shown; findings below may reference images not displayed]

FLUOROSCOPY TIME:  Radiation Exposure Index (as provided by the
fluoroscopic device): 3.9 mGy

Fluoroscopy Time:  35 seconds

Number of Acquired Images:  0

PROCEDURE:
The risks and benefits of the procedure were discussed with the
patient, and written informed consent was obtained. The patient
stated no history of allergy to contrast media. A formal timeout
procedure was performed with the patient according to departmental
protocol.

RIGHT SHOULDER:

The patient was placed supine on the fluoroscopy table and the right
glenohumeral joint was identified under fluoroscopy. The skin
overlying the right glenohumeral joint was subsequently cleaned with
Betadine and a sterile drape was placed over the area of interest. 2
ml 1% Lidocaine was used to anesthetize the skin around the needle
insertion site.

A 20 gauge spinal needle was inserted into the right glenohumeral
joint under fluoroscopy.

11 ml clear synovial fluid was aspirated and sent to the laboratory
for analysis.

The needle was removed and hemostasis was achieved.

LEFT SHOULDER:

The patient was placed supine on the fluoroscopy table and the left
glenohumeral joint was identified under fluoroscopy. The skin
overlying the left glenohumeral joint was subsequently cleaned with
Betadine and a sterile drape was placed over the area of interest. 2
ml 1% Lidocaine was used to anesthetize the skin around the needle
insertion site.

A 20 gauge spinal needle was inserted into the left glenohumeral
joint under fluoroscopy.

0.5 ml bloody synovial fluid was aspirated and sent to the
laboratory for analysis.

The needle was removed and hemostasis was achieved.

The patient tolerated the procedure well without complication.
IMPRESSION: Technically successful bilateral shoulder aspirations.

## 2022-08-07 ENCOUNTER — Encounter: Payer: Self-pay | Admitting: Internal Medicine

## 2022-08-08 NOTE — Telephone Encounter (Signed)
Patient called back and said he is desperate to get the medication.  Please send

## 2022-08-09 NOTE — Telephone Encounter (Signed)
Pls advise../l,mb

## 2022-08-15 ENCOUNTER — Telehealth: Payer: Medicare Other | Admitting: Internal Medicine

## 2022-08-15 DIAGNOSIS — R197 Diarrhea, unspecified: Secondary | ICD-10-CM

## 2022-08-15 DIAGNOSIS — U071 COVID-19: Secondary | ICD-10-CM | POA: Diagnosis not present

## 2022-08-15 MED ORDER — HYDROCODONE BIT-HOMATROP MBR 5-1.5 MG/5ML PO SOLN: 5.0000 mL | Freq: Four times a day (QID) | ORAL | 0 refills | Status: AC | PRN

## 2022-08-15 MED ORDER — DIPHENOXYLATE-ATROPINE 2.5-0.025 MG PO TABS: 1.0000 | ORAL_TABLET | Freq: Four times a day (QID) | ORAL | 1 refills | Status: DC | PRN

## 2022-08-15 NOTE — Progress Notes (Signed)
Patient ID: Chris Obrien, male   DOB: 06-09-1950, 72 y.o.   MRN: [id number]  Virtual Visit via Video Note  I connected with Chris Obrien on 08/15/22 at  3:00 PM EDT by a video enabled telemedicine application and verified that I am speaking with the correct person using two identifiers.  Location of all participants today Patient: at home Provider: at office   I discussed the limitations of evaluation and management by telemedicine and the availability of in person appointments. The patient expressed understanding and agreed to proceed.  History of Present Illness: Here to f/u after just returned from [place] 7/29, found to be covid + after onset URI symptoms 7/30 with unfortunate diarrhea and reduced appetite now lost 14 lbs by report.  Currently has mild URI symptoms with congestion, cough, and mild intermittent nausea, watery diarrhea, but denies fever, blood or worsening abd pain.  OTC immodium not working well.  Pt denies chest pain, increased sob or doe, wheezing, orthopnea, PND, increased LE swelling, palpitations, dizziness or syncope.   Pt denies polydipsia, polyuria, or new focal neuro s/s.   Has hx of unexplained diarrhea may 2023 as well.   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 VERY LIMITED RANGE OF MOTTION   Right knee skin infection    Sleep apnea    weight loss so no cpap now    Swelling of joint of right knee    Urticaria    Past Surgical History:  Procedure  Laterality Date   ADENOIDECTOMY     COLONOSCOPY     EXCISIONAL TOTAL KNEE ARTHROPLASTY WITH ANTIBIOTIC SPACERS Right 04/27/2012   Procedure: EXCISIONAL TOTAL KNEE ARTHROPLASTY WITH ANTIBIOTIC SPACERS;  Surgeon: [name], MD;  Location: [facility];  Service: Orthopedics;  Laterality: Right;  SAME DAY LABS   IRRIGATION AND DEBRIDEMENT KNEE Right 04/02/2012   Procedure: IRRIGATION AND DEBRIDEMENT RIGHT PRE-PATELLA  ;  Surgeon: [name], MD;  Location: [facility];  Service: Orthopedics;  Laterality: Right;   KNEE ARTHROSCOPY Right aug 2012   POLYPECTOMY     SINOSCOPY     TONSILLECTOMY     TOTAL KNEE ARTHROPLASTY  01/28/2012   Procedure: TOTAL KNEE ARTHROPLASTY;  Surgeon: [name], MD;  Location: [facility];  Service: Orthopedics;  Laterality: Right;   TOTAL KNEE ARTHROPLASTY Left 07/17/2015   Procedure: LEFT TOTAL KNEE ARTHROPLASTY;  Surgeon: Durene Romans, MD;  Location: [facility];  Service: Orthopedics;  Laterality: Left;   TOTAL KNEE REVISION Right 06/15/2012   Procedure: reimplantation of right total knee ;  Surgeon: [name], MD;  Location: [facility];  Service: Orthopedics;  Laterality: Right;   TOTAL SHOULDER REPLACEMENT Bilateral    done in [facility]    ULNAR NERVE REPAIR Right 2008    reports that he quit smoking about 34 years ago. His smoking use included  cigars. He has never used smokeless tobacco. He reports current alcohol use. He reports that he does not use drugs. family history includes Alzheimer's disease in his mother; Arthritis in his father; Diabetes in his brother; Hyperlipidemia in an other family member. Allergies  Allergen Reactions   Amoxicillin Hives   Vancomycin Rash    Severe redness all over body with swollen ankles and hands   Tramadol Itching    2019 Shoulder post-op   Oxycodone Rash   Penicillins Itching and Rash   Current Outpatient Medications on File Prior to Visit  Medication Sig Dispense Refill   B Complex-C-Folic Acid (B COMPLEX-VITAMIN  C-FOLIC ACID) 1 MG tablet Take 1 tablet by mouth daily with breakfast. 100 tablet 3   cefadroxil (DURICEF) 500 MG capsule Take 1 capsule (500 mg total) by mouth 2 (two) times daily. 28 capsule 1   celecoxib (CELEBREX) 200 MG capsule Take 1 capsule (200 mg total) by mouth every 12 (twelve) hours. 180 capsule 3   Cholecalciferol (VITAMIN D3) 50 MCG (2000 UT) capsule Take 2 capsules (4,000 Units total) by mouth daily. 100 capsule 3   mometasone (ELOCON) 0.1 % ointment Apply topically daily. 45 g 0   tamsulosin (FLOMAX) 0.4 MG CAPS capsule Take by mouth.     zolpidem (AMBIEN) 10 MG tablet Take 1 tablet (10 mg total) by mouth at bedtime as needed for sleep. 30 tablet 1   No current facility-administered medications on file prior to visit.    Observations/Objective: Alert, NAD, appropriate mood and affect, resps normal, cn 2-12 intact, moves all 4s, no visible rash or swelling Lab Results  Component Value Date   WBC 4.3 07/08/2022   HGB 12.1 (L) 07/08/2022   HCT 38.0 (L) 07/08/2022   PLT 355.0 07/08/2022   GLUCOSE 92 07/08/2022   CHOL 142 11/25/2019   TRIG 55.0 11/25/2019   HDL 43.00 11/25/2019   LDLDIRECT 130.9 12/08/2012   LDLCALC 88 11/25/2019   ALT 12 07/08/2022   AST 15 07/08/2022   NA 139 07/08/2022   K 4.3 07/08/2022   CL 104 07/08/2022   CREATININE 1.00 07/08/2022   BUN 26 (H) 07/08/2022   CO2 26 07/08/2022   TSH 1.32 07/08/2022   PSA 3.94 10/04/2021   INR 1.09 06/03/2012   HGBA1C 5.4 05/23/2020  Assessment and Plan: See notes  Follow Up Instructions: See notes   I discussed the assessment and treatment plan with the patient. The patient was provided an opportunity to ask questions and all were answered. The patient agreed with the plan and demonstrated an understanding of the instructions.   The patient was advised to call back or seek an in-person evaluation if the symptoms worsen or if the condition fails to improve as anticipated.  Chris Barre, MD

## 2022-08-16 ENCOUNTER — Other Ambulatory Visit (INDEPENDENT_AMBULATORY_CARE_PROVIDER_SITE_OTHER): Payer: Medicare Other

## 2022-08-16 DIAGNOSIS — R7989 Other specified abnormal findings of blood chemistry: Secondary | ICD-10-CM

## 2022-08-16 DIAGNOSIS — R61 Generalized hyperhidrosis: Secondary | ICD-10-CM | POA: Diagnosis not present

## 2022-08-16 LAB — FOLLICLE STIMULATING HORMONE: FSH: 25.2 m[IU]/mL — ABNORMAL HIGH (ref 1.4–18.1)

## 2022-08-16 LAB — LUTEINIZING HORMONE: LH: 7.7 m[IU]/mL (ref 3.10–34.60)

## 2022-08-16 LAB — TESTOSTERONE: Testosterone: 194.8 ng/dL — ABNORMAL LOW (ref 300.00–890.00)

## 2022-08-16 LAB — SEDIMENTATION RATE: Sed Rate: 60 mm/hr — ABNORMAL HIGH (ref 0–20)

## 2022-08-17 ENCOUNTER — Encounter: Payer: Self-pay | Admitting: Internal Medicine

## 2022-08-17 DIAGNOSIS — U071 COVID-19: Secondary | ICD-10-CM | POA: Insufficient documentation

## 2022-08-17 NOTE — Assessment & Plan Note (Signed)
Mild to mod, pt currently out of window for paxlovid tx, now for lomotil prn and cough med prn, o/w  to f/u any worsening symptoms or concerns

## 2022-08-17 NOTE — Patient Instructions (Signed)
Please take all new medication as prescribed 

## 2022-08-17 NOTE — Assessment & Plan Note (Signed)
Consider GI panel if worsening or persists

## 2022-09-02 ENCOUNTER — Encounter: Payer: Self-pay | Admitting: Internal Medicine

## 2022-09-02 ENCOUNTER — Ambulatory Visit (INDEPENDENT_AMBULATORY_CARE_PROVIDER_SITE_OTHER): Payer: Medicare Other

## 2022-09-02 ENCOUNTER — Ambulatory Visit (INDEPENDENT_AMBULATORY_CARE_PROVIDER_SITE_OTHER): Payer: Medicare Other | Admitting: Internal Medicine

## 2022-09-02 VITALS — BP 146/92 | HR 77 | Temp 98.0°F | Ht 73.0 in | Wt 234.0 lb

## 2022-09-02 DIAGNOSIS — U071 COVID-19: Secondary | ICD-10-CM

## 2022-09-02 DIAGNOSIS — G9331 Postviral fatigue syndrome: Secondary | ICD-10-CM | POA: Diagnosis not present

## 2022-09-02 DIAGNOSIS — R0609 Other forms of dyspnea: Secondary | ICD-10-CM | POA: Insufficient documentation

## 2022-09-02 DIAGNOSIS — R202 Paresthesia of skin: Secondary | ICD-10-CM | POA: Diagnosis not present

## 2022-09-02 DIAGNOSIS — L239 Allergic contact dermatitis, unspecified cause: Secondary | ICD-10-CM

## 2022-09-02 DIAGNOSIS — L309 Dermatitis, unspecified: Secondary | ICD-10-CM | POA: Insufficient documentation

## 2022-09-02 DIAGNOSIS — Z96612 Presence of left artificial shoulder joint: Secondary | ICD-10-CM

## 2022-09-02 DIAGNOSIS — E291 Testicular hypofunction: Secondary | ICD-10-CM | POA: Insufficient documentation

## 2022-09-02 DIAGNOSIS — R5383 Other fatigue: Secondary | ICD-10-CM | POA: Insufficient documentation

## 2022-09-02 DIAGNOSIS — D649 Anemia, unspecified: Secondary | ICD-10-CM

## 2022-09-02 DIAGNOSIS — Z889 Allergy status to unspecified drugs, medicaments and biological substances status: Secondary | ICD-10-CM

## 2022-09-02 DIAGNOSIS — T50905D Adverse effect of unspecified drugs, medicaments and biological substances, subsequent encounter: Secondary | ICD-10-CM

## 2022-09-02 DIAGNOSIS — R197 Diarrhea, unspecified: Secondary | ICD-10-CM

## 2022-09-02 HISTORY — DX: Dermatitis, unspecified: L30.9

## 2022-09-02 LAB — CBC WITH DIFFERENTIAL/PLATELET
Basophils Absolute: 0.1 10*3/uL (ref 0.0–0.1)
Basophils Relative: 1 % (ref 0.0–3.0)
Eosinophils Absolute: 0.4 10*3/uL (ref 0.0–0.7)
Eosinophils Relative: 6.3 % — ABNORMAL HIGH (ref 0.0–5.0)
HCT: 37.1 % — ABNORMAL LOW (ref 39.0–52.0)
Hemoglobin: 11.8 g/dL — ABNORMAL LOW (ref 13.0–17.0)
Lymphocytes Relative: 23.4 % (ref 12.0–46.0)
Lymphs Abs: 1.5 10*3/uL (ref 0.7–4.0)
MCHC: 31.7 g/dL (ref 30.0–36.0)
MCV: 76.9 fl — ABNORMAL LOW (ref 78.0–100.0)
Monocytes Absolute: 0.6 10*3/uL (ref 0.1–1.0)
Monocytes Relative: 9.7 % (ref 3.0–12.0)
Neutro Abs: 3.8 10*3/uL (ref 1.4–7.7)
Neutrophils Relative %: 59.6 % (ref 43.0–77.0)
Platelets: 499 10*3/uL — ABNORMAL HIGH (ref 150.0–400.0)
RBC: 4.83 Mil/uL (ref 4.22–5.81)
RDW: 17.6 % — ABNORMAL HIGH (ref 11.5–15.5)
WBC: 6.4 10*3/uL (ref 4.0–10.5)

## 2022-09-02 LAB — URINALYSIS
Bilirubin Urine: NEGATIVE
Hgb urine dipstick: NEGATIVE
Ketones, ur: NEGATIVE
Leukocytes,Ua: NEGATIVE
Nitrite: NEGATIVE
Specific Gravity, Urine: 1.015 (ref 1.000–1.030)
Total Protein, Urine: NEGATIVE
Urine Glucose: NEGATIVE
Urobilinogen, UA: 0.2 (ref 0.0–1.0)
pH: 7 (ref 5.0–8.0)

## 2022-09-02 LAB — C-REACTIVE PROTEIN: CRP: 9.8 mg/dL (ref 0.5–20.0)

## 2022-09-02 LAB — SEDIMENTATION RATE: Sed Rate: 96 mm/h — ABNORMAL HIGH (ref 0–20)

## 2022-09-02 LAB — COMPREHENSIVE METABOLIC PANEL
ALT: 33 U/L (ref 0–53)
AST: 31 U/L (ref 0–37)
Albumin: 3.5 g/dL (ref 3.5–5.2)
Alkaline Phosphatase: 71 U/L (ref 39–117)
BUN: 16 mg/dL (ref 6–23)
CO2: 29 mEq/L (ref 19–32)
Calcium: 9.1 mg/dL (ref 8.4–10.5)
Chloride: 103 meq/L (ref 96–112)
Creatinine, Ser: 1.04 mg/dL (ref 0.40–1.50)
GFR: 70.88 mL/min (ref >60.00)
Glucose, Bld: 91 mg/dL (ref 70–99)
Potassium: 4.8 meq/L (ref 3.5–5.1)
Sodium: 139 meq/L (ref 135–145)
Total Bilirubin: 0.5 mg/dL (ref 0.2–1.2)
Total Protein: 6.9 g/dL (ref 6.0–8.3)

## 2022-09-02 MED ORDER — DIPHENOXYLATE-ATROPINE 2.5-0.025 MG PO TABS: 1.0000 | ORAL_TABLET | Freq: Four times a day (QID) | ORAL | 1 refills | Status: DC | PRN

## 2022-09-02 MED ORDER — ALBUTEROL SULFATE HFA 108 (90 BASE) MCG/ACT IN AERS: 2.0000 | INHALATION_SPRAY | RESPIRATORY_TRACT | 5 refills | Status: DC | PRN

## 2022-09-02 MED ORDER — "BD LUER-LOK SYRINGE 23G X 1-1/2"" 3 ML MISC": 3 refills | Status: DC

## 2022-09-02 MED ORDER — TESTOSTERONE CYPIONATE 200 MG/ML IM SOLN: 100.0000 mg | INTRAMUSCULAR | 5 refills | Status: DC

## 2022-09-02 NOTE — Assessment & Plan Note (Signed)
Dr Clent Ridges  ([facility]) 2024 Dx - dermatitis; Dr Steffanie Dunn: "You are positive to the following allergens in Patch testing: Benzalkonium Chloride Budesonide Cocamide DEA Colophony Formaldehyde Limonene Linalool Sorbitan Sesquioleate   HISTORY OF PRESENT ILLNESS:   Chris Obrien is a 72 y.o. male here at the request of [name], MD in consultation for skin evaluation and consideration of patch testing.  The eruption looks like other when it starts. The rash started 2021. The site that was first affected was the left shoulder . Site(s) that have ever been affected are the left shoulder. The site(s) that are the most severely/most often affected are the not provided. Symptoms itch.   Has history of multiple joint replacements and revisions   Nickel free shoulder implant on the right side has been better than prior versions. Did have some rash with some implants but not consistently and nothing like his current rash and inflammation on the chest.   2019 had left shoulder done  He has not seen a dermatologist for this and has not had a biopsy. He has seen other doctors including allergy and infectious disease and has been treated for possible infection and reports currently infection is not considered likely.   Per allergy note: Shafin reports having 4 shoulder surgeries- 2 on each shoulders. 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 About 1 mo later, he developed poison ivy of the right knee with marked swelling of the knee. He notes no poison ivy elsewhere. It ended up infected and he returned to surgery with removal of artifical knee and they placed medicated plugs. He was put on a PICC line and treated with vancomycin. He took it daily for a month. He reports vanco takes an hour to infuse. He used the infusion ball - he infused it over instead of 1hr. He had no immediate symptoms but the next day, he was diffusely red like he was burned. He had low BP. He went to ED  and was treated with steroids with improvement within 1hr. He reports diffuse skin peeling. He had some chills but no overt fevers. Diagnosed with "red man's syndrome." He had knee replacement 6 weeks later. His orthopedic surgeon told him he might have issues with "latent poison ivy" that can stay in the system. His knee fully recovered. He had his left knee replacement about 86yrs later (2017 or 2018) - no complications. He doesn't believe he was subsequently treated with vancomycin.  He needed right rotator cuff repair but his ortho didn't want to treat this. He was referred to [facility] for reverse repair. He had right rotator cuff repair 5/20219. 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 He developed localized reactions with both shoulders post surgery - one photo shown of abscess-like lesion. Never fully resolved on left shoulder. Treated as infections. But continues to struggle with inflammation, localized rash. Right shoulder is fully healed without rash or redness since surgery 12/2020. He had a reverse shoulder - did not use nickel-based artificial shoulder. He has a titanium shoulder.   Photo of diffuse erythema of left shoulder- photo should of diffuse anterior erythema 12/2019. He notes shoulder "has taken a life of its own." It is red and weeping now. He notes near resolution of the erythema and redness.  Per patient his left shoulder hardware is: Arthrex Reverse titanium and cobalt chrome(not nickel free). Liner is polyethylene  The patient has tried the following creams: "many"; pills: none. The current treatments are somewhat working.  The rash is worse in the no seasonal pattern.   The patient has history of:  Hay fever / seasonal allergies: no Atopic dermatitis: no Asthma: no  Prick testing: no Patch testing: no;  Biopsy: no.  Previous Physician(s): [name], MD   PERSONAL CARE PRODUCTS:  The patient's list of personal care products is scanned into the  chart.  KNOWN ALLERGIES: Allergies: Penicillins, Vancomycin, and Oxycodone Environmental: none Food: none Jewelry/Metals: none Other: none "   Saw Dr Ardyth Harps 11/2017 (Allergist):  "Assessment and Plan:  1. Adverse drug effect, subsequent encounter  1. Pretreat for anticipated narcotic use:  A. Loratadine 10 mg -2 tablets daily (Claritin) B. Famotidine 20 mg -2 tablet daily (Pepcid)  2. Can use hydrocodone as narcotic if required   Rx - [name] cream prn Pretreat for anticipated narcotic use:               A.  Loratadine 10 mg -2 tablets daily (Claritin)             B.  Famotidine 20 mg -2 tablet daily (Pepcid)   2.  Can use hydrocodone as narcotic if required 3.  Remain away from vancomycin and amoxicillin (penicillin)

## 2022-09-02 NOTE — Assessment & Plan Note (Addendum)
Post-COVID DOE. Remote h/o asthma R/o CAP Labs, CXR Start Ventolin MDI If not better-rule out PE chest CT and cardiac echo

## 2022-09-02 NOTE — Assessment & Plan Note (Signed)
Improving Obtain labs, CXR

## 2022-09-02 NOTE — Assessment & Plan Note (Signed)
Dr Clent Ridges  ([facility]) 2024 Dx - dermatitis; Dr Steffanie Dunn: "You are positive to the following allergens in Patch testing: Benzalkonium Chloride Budesonide Cocamide DEA Colophony Formaldehyde Limonene Linalool Sorbitan Sesquioleate   HISTORY OF PRESENT ILLNESS:   Chris Obrien is a 72 y.o. male here at the request of Chris Osgood, MD in consultation for skin evaluation and consideration of patch testing.  The eruption looks like other when it starts. The rash started 2021. The site that was first affected was the left shoulder . Site(s) that have ever been affected are the left shoulder. The site(s) that are the most severely/most often affected are the not provided. Symptoms itch.   Has history of multiple joint replacements and revisions   Nickel free shoulder implant on the right side has been better than prior versions. Did have some rash with some implants but not consistently and nothing like his current rash and inflammation on the chest.   2019 had left shoulder done  He has not seen a dermatologist for this and has not had a biopsy. He has seen other doctors including allergy and infectious disease and has been treated for possible infection and reports currently infection is not considered likely.   Per allergy note: Chris Obrien reports having 4 shoulder surgeries- 2 on each shoulders. 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 About 1 mo later, he developed poison ivy of the right knee with marked swelling of the knee. He notes no poison ivy elsewhere. It ended up infected and he returned to surgery with removal of artifical knee and they placed medicated plugs. He was put on a PICC line and treated with vancomycin. He took it daily for a month. He reports vanco takes an hour to infuse. He used the infusion ball - he infused it over instead of 1hr. He had no immediate symptoms but the next day, he was diffusely red like he was burned. He had low BP. He went to ED  and was treated with steroids with improvement within 1hr. He reports diffuse skin peeling. He had some chills but no overt fevers. Diagnosed with "red man's syndrome." He had knee replacement 6 weeks later. His orthopedic surgeon told him he might have issues with "latent poison ivy" that can stay in the system. His knee fully recovered. He had his left knee replacement about 31yrs later (2017 or 2018) - no complications. He doesn't believe he was subsequently treated with vancomycin.  He needed right rotator cuff repair but his ortho didn't want to treat this. He was referred to [facility] for reverse repair. He had right rotator cuff repair 5/20219. 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 He developed localized reactions with both shoulders post surgery - one photo shown of abscess-like lesion. Never fully resolved on left shoulder. Treated as infections. But continues to struggle with inflammation, localized rash. Right shoulder is fully healed without rash or redness since surgery 12/2020. He had a reverse shoulder - did not use nickel-based artificial shoulder. He has a titanium shoulder.   Photo of diffuse erythema of left shoulder- photo should of diffuse anterior erythema 12/2019. He notes shoulder "has taken a life of its own." It is red and weeping now. He notes near resolution of the erythema and redness.  Per patient his left shoulder hardware is: Arthrex Reverse titanium and cobalt chrome(not nickel free). Liner is polyethylene  The patient has tried the following creams: "many"; pills: none. The current treatments are somewhat working.  The rash is worse in the no seasonal pattern.   The patient has history of:  Hay fever / seasonal allergies: no Atopic dermatitis: no Asthma: no  Prick testing: no Patch testing: no;  Biopsy: no.  Previous Physician(s): Chris Osgood, MD   PERSONAL CARE PRODUCTS:  The patient's list of personal care products is scanned into the  chart.  KNOWN ALLERGIES: Allergies: Penicillins, Vancomycin, and Oxycodone Environmental: none Food: none Jewelry/Metals: none Other: none "   Saw Dr Chris Obrien 11/2017 (Allergist):  "Assessment and Plan:  1. Adverse drug effect, subsequent encounter  1. Pretreat for anticipated narcotic use:  A. Loratadine 10 mg -2 tablets daily (Claritin) B. Famotidine 20 mg -2 tablet daily (Pepcid)  2. Can use hydrocodone as narcotic if required   Rx - [name] cream prn

## 2022-09-02 NOTE — Assessment & Plan Note (Signed)
Post-COVID CXR Labs

## 2022-09-02 NOTE — Assessment & Plan Note (Signed)
Options to treat versus not titrate, risks and benefits of testosterone therapy were discussed. Prescription provided.

## 2022-09-02 NOTE — Assessment & Plan Note (Addendum)
Acute diarrhea probably related to COVID.  Imodium helped Resolved

## 2022-09-02 NOTE — Assessment & Plan Note (Signed)
Dr Clent Ridges  ([facility]) 2024 Dx - dermatitis; Dr Steffanie Dunn: "You are positive to the following allergens in Patch testing: Benzalkonium Chloride Budesonide Cocamide DEA Colophony Formaldehyde Limonene Linalool Sorbitan Sesquioleate   HISTORY OF PRESENT ILLNESS:   Chris Obrien is a 72 y.o. male here at the request of [name], MD in consultation for skin evaluation and consideration of patch testing.  The eruption looks like other when it starts. The rash started 2021. The site that was first affected was the left shoulder . Site(s) that have ever been affected are the left shoulder. The site(s) that are the most severely/most often affected are the not provided. Symptoms itch.   Has history of multiple joint replacements and revisions   Nickel free shoulder implant on the right side has been better than prior versions. Did have some rash with some implants but not consistently and nothing like his current rash and inflammation on the chest.   2019 had left shoulder done  He has not seen a dermatologist for this and has not had a biopsy. He has seen other doctors including allergy and infectious disease and has been treated for possible infection and reports currently infection is not considered likely.   Per allergy note: [name] reports having 4 shoulder surgeries- 2 on each shoulders. 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 About 1 mo later, he developed poison ivy of the right knee with marked swelling of the knee. He notes no poison ivy elsewhere. It ended up infected and he returned to surgery with removal of artifical knee and they placed medicated plugs. He was put on a PICC line and treated with vancomycin. He took it daily for a month. He reports vanco takes an hour to infuse. He used the infusion ball - he infused it over instead of 1hr. He had no immediate symptoms but the next day, he was diffusely red like he was burned. He had low BP. He went to ED  and was treated with steroids with improvement within 1hr. He reports diffuse skin peeling. He had some chills but no overt fevers. Diagnosed with "red man's syndrome." He had knee replacement 6 weeks later. His orthopedic surgeon told him he might have issues with "latent poison ivy" that can stay in the system. His knee fully recovered. He had his left knee replacement about 31yrs later (2017 or 2018) - no complications. He doesn't believe he was subsequently treated with vancomycin.  He needed right rotator cuff repair but his ortho didn't want to treat this. He was referred to [facility] for reverse repair. He had right rotator cuff repair 5/20219. 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 He developed localized reactions with both shoulders post surgery - one photo shown of abscess-like lesion. Never fully resolved on left shoulder. Treated as infections. But continues to struggle with inflammation, localized rash. Right shoulder is fully healed without rash or redness since surgery 12/2020. He had a reverse shoulder - did not use nickel-based artificial shoulder. He has a titanium shoulder.   Photo of diffuse erythema of left shoulder- photo should of diffuse anterior erythema 12/2019. He notes shoulder "has taken a life of its own." It is red and weeping now. He notes near resolution of the erythema and redness.  Per patient his left shoulder hardware is: Arthrex Reverse titanium and cobalt chrome(not nickel free). Liner is polyethylene  The patient has tried the following creams: "many"; pills: none. The current treatments are somewhat working.  The rash is worse in the no seasonal pattern.   The patient has history of:  Hay fever / seasonal allergies: no Atopic dermatitis: no Asthma: no  Prick testing: no Patch testing: no;  Biopsy: no.  Previous Physician(s): [name], MD   PERSONAL CARE PRODUCTS:  The patient's list of personal care products is scanned into the  chart.  KNOWN ALLERGIES: Allergies: Penicillins, Vancomycin, and Oxycodone Environmental: none Food: none Jewelry/Metals: none Other: none "   Saw Dr Ardyth Harps 11/2017 (Allergist):  "Assessment and Plan:  1. Adverse drug effect, subsequent encounter  1. Pretreat for anticipated narcotic use:  A. Loratadine 10 mg -2 tablets daily (Claritin) B. Famotidine 20 mg -2 tablet daily (Pepcid)  2. Can use hydrocodone as narcotic if required   Rx - [name] cream prn

## 2022-09-02 NOTE — Progress Notes (Unsigned)
Subjective:  Patient ID: Chris Obrien, male    DOB: Jul 20, 1950  Age: 72 y.o. MRN: [id number]  CC: Follow-up (Lab f/u, Pt is concerned about post-COVID sxs of SOB upon exertion, pt states his O2 wil drop to 80's when checks his O2 after noticing the SOB then after a few minutes o2 will be back in the 90's. )   HPI Chris Obrien presents for post-COVID fatigue, SOB/DOE. O2 sat was going down to 88% w/exertion  Per hx: Dr Clent Ridges  ([facility]) 2024 Dx - dermatitis; Dr Steffanie Dunn: "You are positive to the following allergens in Patch testing: Benzalkonium Chloride Budesonide Cocamide DEA Colophony Formaldehyde Limonene Linalool Sorbitan Sesquioleate   HISTORY OF PRESENT ILLNESS:   Chris Obrien is a 72 y.o. male here at the request of [name], MD in consultation for skin evaluation and consideration of patch testing.  The eruption looks like other when it starts. The rash started 2021. The site that was first affected was the left shoulder . Site(s) that have ever been affected are the left shoulder. The site(s) that are the most severely/most often affected are the not provided. Symptoms itch.   Has history of multiple joint replacements and revisions   Nickel free shoulder implant on the right side has been better than prior versions. Did have some rash with some implants but not consistently and nothing like his current rash and inflammation on the chest.   2019 had left shoulder done  He has not seen a dermatologist for this and has not had a biopsy. He has seen other doctors including allergy and infectious disease and has been treated for possible infection and reports currently infection is not considered likely.   Per allergy note: Chris Obrien reports having 4 shoulder surgeries- 2 on each shoulders. 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 About 1 mo later, he developed poison ivy of the right knee with marked swelling of the knee. He notes no poison ivy  elsewhere. It ended up infected and he returned to surgery with removal of artifical knee and they placed medicated plugs. He was put on a PICC line and treated with vancomycin. He took it daily for a month. He reports vanco takes an hour to infuse. He used the infusion ball - he infused it over instead of 1hr. He had no immediate symptoms but the next day, he was diffusely red like he was burned. He had low BP. He went to ED and was treated with steroids with improvement within 1hr. He reports diffuse skin peeling. He had some chills but no overt fevers. Diagnosed with "red man's syndrome." He had knee replacement 6 weeks later. His orthopedic surgeon told him he might have issues with "latent poison ivy" that can stay in the system. His knee fully recovered. He had his left knee replacement about 83yrs later (2017 or 2018) - no complications. He doesn't believe he was subsequently treated with vancomycin.  He needed right rotator cuff repair but his ortho didn't want to treat this. He was referred to [facility] for reverse repair. He had right rotator cuff repair 5/20219. 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 He developed localized reactions with both shoulders post surgery - one photo shown of abscess-like lesion. Never fully resolved on left shoulder. Treated as infections. But continues to struggle with inflammation, localized rash. Right shoulder is fully healed without rash or redness since surgery 12/2020. He had a reverse shoulder - did not use  nickel-based artificial shoulder. He has a titanium shoulder.   Photo of diffuse erythema of left shoulder- photo should of diffuse anterior erythema 12/2019. He notes shoulder "has taken a life of its own." It is red and weeping now. He notes near resolution of the erythema and redness.  Per patient his left shoulder hardware is: Arthrex Reverse titanium and cobalt chrome(not nickel free). Liner is polyethylene  The patient has tried the  following creams: "many"; pills: none. The current treatments are somewhat working.  The rash is worse in the no seasonal pattern.   The patient has history of:  Hay fever / seasonal allergies: no Atopic dermatitis: no Asthma: no  Prick testing: no Patch testing: no;  Biopsy: no.  Previous Physician(s): [name], MD   PERSONAL CARE PRODUCTS:  The patient's list of personal care products is scanned into the chart.  KNOWN ALLERGIES: Allergies: Penicillins, Vancomycin, and Oxycodone Environmental: none Food: none Jewelry/Metals: none Other: none "   Saw Dr Ardyth Harps 11/2017 (Allergist):  "Assessment and Plan:  1. Adverse drug effect, subsequent encounter  1. Pretreat for anticipated narcotic use:  A. Loratadine 10 mg -2 tablets daily (Claritin) B. Famotidine 20 mg -2 tablet daily (Pepcid)  2. Can use hydrocodone as narcotic if required   Rx - [name] cream prn   Outpatient Medications Prior to Visit  Medication Sig Dispense Refill   B Complex-C-Folic Acid (B COMPLEX-VITAMIN C-FOLIC ACID) 1 MG tablet Take 1 tablet by mouth daily with breakfast. 100 tablet 3   cefadroxil (DURICEF) 500 MG capsule Take 1 capsule (500 mg total) by mouth 2 (two) times daily. 28 capsule 1   celecoxib (CELEBREX) 200 MG capsule Take 1 capsule (200 mg total) by mouth every 12 (twelve) hours. 180 capsule 3   Cholecalciferol (VITAMIN D3) 50 MCG (2000 UT) capsule Take 2 capsules (4,000 Units total) by mouth daily. 100 capsule 3   mometasone (ELOCON) 0.1 % ointment Apply topically daily. 45 g 0   tamsulosin (FLOMAX) 0.4 MG CAPS capsule Take by mouth.     zolpidem (AMBIEN) 10 MG tablet Take 1 tablet (10 mg total) by mouth at bedtime as needed for sleep. 30 tablet 1   diphenoxylate-atropine (LOMOTIL) 2.5-0.025 MG tablet Take 1 tablet by mouth 4 (four) times daily as needed for diarrhea or loose stools. 30 tablet 1   No facility-administered medications prior to visit.    ROS: Review of  Systems  Constitutional:  Positive for diaphoresis and fatigue. Negative for appetite change and unexpected weight change.  HENT:  Negative for congestion, nosebleeds, sneezing, sore throat and trouble swallowing.   Eyes:  Negative for itching and visual disturbance.  Respiratory:  Positive for shortness of breath. Negative for cough and wheezing.   Cardiovascular:  Negative for chest pain, palpitations and leg swelling.  Gastrointestinal:  Negative for abdominal distention, blood in stool, diarrhea and nausea.  Genitourinary:  Negative for frequency and hematuria.  Musculoskeletal:  Negative for back pain, gait problem, joint swelling and neck pain.  Skin:  Negative for rash.  Neurological:  Negative for dizziness, tremors, speech difficulty and weakness.  Psychiatric/Behavioral:  Negative for agitation, dysphoric mood and sleep disturbance. The patient is not nervous/anxious.     Objective:  BP (!) 146/92 (BP Location: Left Arm, Patient Position: Sitting, Cuff Size: Normal)   Pulse 77   Temp 98 F (36.7 C) (Oral)   Ht 6\' 1"  (1.854 m)   Wt 234 lb (106.1 kg)   SpO2 97%  BMI 30.87 kg/m   BP Readings from Last 3 Encounters:  09/02/22 (!) 146/92  07/08/22 (!) 160/86  05/14/22 (!) 141/81    Wt Readings from Last 3 Encounters:  09/02/22 234 lb (106.1 kg)  07/08/22 246 lb (111.6 kg)  05/14/22 239 lb (108.4 kg)    Physical Exam Constitutional:      General: He is not in acute distress.    Appearance: He is well-developed. He is obese.     Comments: NAD  Eyes:     Conjunctiva/sclera: Conjunctivae normal.     Pupils: Pupils are equal, round, and reactive to light.  Neck:     Thyroid: No thyromegaly.     Vascular: No JVD.  Cardiovascular:     Rate and Rhythm: Normal rate and regular rhythm.     Heart sounds: Normal heart sounds. No murmur heard.    No friction rub. No gallop.  Pulmonary:     Effort: Pulmonary effort is normal. No respiratory distress.     Breath sounds:  Normal breath sounds. No wheezing or rales.  Chest:     Chest wall: No tenderness.  Abdominal:     General: Bowel sounds are normal. There is no distension.     Palpations: Abdomen is soft. There is no mass.     Tenderness: There is no abdominal tenderness. There is no guarding or rebound.  Musculoskeletal:        General: No tenderness. Normal range of motion.     Cervical back: Normal range of motion.  Lymphadenopathy:     Cervical: No cervical adenopathy.  Skin:    General: Skin is warm and dry.     Findings: No rash.  Neurological:     Mental Status: He is alert and oriented to person, place, and time.     Cranial Nerves: No cranial nerve deficit.     Motor: No abnormal muscle tone.     Coordination: Coordination normal.     Gait: Gait normal.     Deep Tendon Reflexes: Reflexes are normal and symmetric.  Psychiatric:        Behavior: Behavior normal.        Thought Content: Thought content normal.        Judgment: Judgment normal.   Purple rash on the chest  Lab Results  Component Value Date   WBC 4.3 07/08/2022   HGB 12.1 (L) 07/08/2022   HCT 38.0 (L) 07/08/2022   PLT 355.0 07/08/2022   GLUCOSE 92 07/08/2022   CHOL 142 11/25/2019   TRIG 55.0 11/25/2019   HDL 43.00 11/25/2019   LDLDIRECT 130.9 12/08/2012   LDLCALC 88 11/25/2019   ALT 12 07/08/2022   AST 15 07/08/2022   NA 139 07/08/2022   K 4.3 07/08/2022   CL 104 07/08/2022   CREATININE 1.00 07/08/2022   BUN 26 (H) 07/08/2022   CO2 26 07/08/2022   TSH 1.32 07/08/2022   PSA 3.94 10/04/2021   INR 1.09 06/03/2012   HGBA1C 5.4 05/23/2020    DG FLUORO GUIDED NEEDLE PLC ASPIRATION/INJECTION LOC  Result Date: 10/11/2020 CLINICAL DATA:  Chronic right worse than left shoulder pain. Prior bilateral reverse total shoulder arthroplasties. Skin rash around the left shoulder since surgery. Bilateral shoulder aspiration requested. FLUOROSCOPY TIME:  Radiation Exposure Index (as provided by the fluoroscopic device): 3.9  mGy Fluoroscopy Time:  35 seconds Number of Acquired Images:  0 PROCEDURE: The risks and benefits of the procedure were discussed with the patient, and written informed consent  was obtained. The patient stated no history of allergy to contrast media. A formal timeout procedure was performed with the patient according to departmental protocol. RIGHT SHOULDER: The patient was placed supine on the fluoroscopy table and the right glenohumeral joint was identified under fluoroscopy. The skin overlying the right glenohumeral joint was subsequently cleaned with Betadine and a sterile drape was placed over the area of interest. 2 ml 1% Lidocaine was used to anesthetize the skin around the needle insertion site. A 20 gauge spinal needle was inserted into the right glenohumeral joint under fluoroscopy. 11 ml clear synovial fluid was aspirated and sent to the laboratory for analysis. The needle was removed and hemostasis was achieved. LEFT SHOULDER: The patient was placed supine on the fluoroscopy table and the left glenohumeral joint was identified under fluoroscopy. The skin overlying the left glenohumeral joint was subsequently cleaned with Betadine and a sterile drape was placed over the area of interest. 2 ml 1% Lidocaine was used to anesthetize the skin around the needle insertion site. A 20 gauge spinal needle was inserted into the left glenohumeral joint under fluoroscopy. 0.5 ml bloody synovial fluid was aspirated and sent to the laboratory for analysis. The needle was removed and hemostasis was achieved. The patient tolerated the procedure well without complication. IMPRESSION: Technically successful bilateral shoulder aspirations. Electronically Signed   By: Obie Dredge M.D.   On: 10/11/2020 13:42   DG FLUORO GUIDED NEEDLE PLC ASPIRATION/INJECTION LOC  Result Date: 10/11/2020 CLINICAL DATA:  Chronic right worse than left shoulder pain. Prior bilateral reverse total shoulder arthroplasties. Skin rash around  the left shoulder since surgery. Bilateral shoulder aspiration requested. FLUOROSCOPY TIME:  Radiation Exposure Index (as provided by the fluoroscopic device): 3.9 mGy Fluoroscopy Time:  35 seconds Number of Acquired Images:  0 PROCEDURE: The risks and benefits of the procedure were discussed with the patient, and written informed consent was obtained. The patient stated no history of allergy to contrast media. A formal timeout procedure was performed with the patient according to departmental protocol. RIGHT SHOULDER: The patient was placed supine on the fluoroscopy table and the right glenohumeral joint was identified under fluoroscopy. The skin overlying the right glenohumeral joint was subsequently cleaned with Betadine and a sterile drape was placed over the area of interest. 2 ml 1% Lidocaine was used to anesthetize the skin around the needle insertion site. A 20 gauge spinal needle was inserted into the right glenohumeral joint under fluoroscopy. 11 ml clear synovial fluid was aspirated and sent to the laboratory for analysis. The needle was removed and hemostasis was achieved. LEFT SHOULDER: The patient was placed supine on the fluoroscopy table and the left glenohumeral joint was identified under fluoroscopy. The skin overlying the left glenohumeral joint was subsequently cleaned with Betadine and a sterile drape was placed over the area of interest. 2 ml 1% Lidocaine was used to anesthetize the skin around the needle insertion site. A 20 gauge spinal needle was inserted into the left glenohumeral joint under fluoroscopy. 0.5 ml bloody synovial fluid was aspirated and sent to the laboratory for analysis. The needle was removed and hemostasis was achieved. The patient tolerated the procedure well without complication. IMPRESSION: Technically successful bilateral shoulder aspirations. Electronically Signed   By: Obie Dredge M.D.   On: 10/11/2020 13:42    Assessment & Plan:   Problem List Items  Addressed This Visit     COVID-19    Improving Obtain labs, CXR      Relevant Orders  Comprehensive metabolic panel   CBC with Differential/Platelet   TSH   Vitamin B12   Urinalysis   Sedimentation rate   DG Chest 2 View   Dermatitis    Purple rash on the chest - treated at [facility]      DOE (dyspnea on exertion)    Post-COVID DOE R/o CAP Labs, CXR        Relevant Orders   Comprehensive metabolic panel   CBC with Differential/Platelet   TSH   Vitamin B12   Urinalysis   Sedimentation rate   DG Chest 2 View   Fatigue - Primary    Post-COVID CXR Labs      Relevant Orders   Comprehensive metabolic panel   CBC with Differential/Platelet   TSH   Vitamin B12   Urinalysis   Sedimentation rate   Other Visit Diagnoses     Paresthesias       Relevant Orders   Vitamin B12         Meds ordered this encounter  Medications   albuterol (VENTOLIN HFA) 108 (90 Base) MCG/ACT inhaler    Sig: Inhale 2 puffs into the lungs every 4 (four) hours as needed for wheezing or shortness of breath.    Dispense:  24 g    Refill:  5   diphenoxylate-atropine (LOMOTIL) 2.5-0.025 MG tablet    Sig: Take 1 tablet by mouth 4 (four) times daily as needed for diarrhea or loose stools.    Dispense:  60 tablet    Refill:  1   testosterone cypionate (DEPOTESTOSTERONE CYPIONATE) 200 MG/ML injection    Sig: Inject 0.5 mLs (100 mg total) into the muscle every 7 (seven) days.    Dispense:  10 mL    Refill:  5   B-D 3CC LUER-LOK SYR 23GX1-1/2 23G X 1-1/2" 3 ML MISC    Sig: As dirrected IM    Dispense:  50 each    Refill:  3      A total time of >45 minutes was spent preparing to see the patient, reviewing tests, x-rays, operative reports and other medical records.  Also, obtaining history and performing comprehensive physical exam.  Additionally, counseling the patient regarding the above listed issues - DOE, allergies, rash.   Finally, documenting clinical information in the health  records, coordination of care, educating the patient. It is a complex case.   Follow-up: No follow-ups on file.  Sonda Primes, MD

## 2022-09-02 NOTE — Assessment & Plan Note (Signed)
Purple rash on the chest - treated at [facility]

## 2022-09-03 ENCOUNTER — Telehealth: Payer: Self-pay | Admitting: *Deleted

## 2022-09-03 ENCOUNTER — Other Ambulatory Visit: Payer: Self-pay | Admitting: Internal Medicine

## 2022-09-03 ENCOUNTER — Ambulatory Visit (INDEPENDENT_AMBULATORY_CARE_PROVIDER_SITE_OTHER): Payer: Medicare Other

## 2022-09-03 DIAGNOSIS — R0609 Other forms of dyspnea: Secondary | ICD-10-CM | POA: Diagnosis not present

## 2022-09-03 LAB — TSH: TSH: 0.93 u[IU]/mL (ref 0.35–5.50)

## 2022-09-03 LAB — IRON,TIBC AND FERRITIN PANEL
%SAT: 7 % — ABNORMAL LOW (ref 20–48)
Ferritin: 250 ng/mL (ref 24–380)
Iron: 16 ug/dL — ABNORMAL LOW (ref 50–180)
TIBC: 234 ug/dL — ABNORMAL LOW (ref 250–425)

## 2022-09-03 LAB — CK: Total CK: 46 U/L (ref 7–232)

## 2022-09-03 LAB — VITAMIN B12: Vitamin B-12: 573 pg/mL (ref 211–911)

## 2022-09-03 NOTE — Telephone Encounter (Signed)
-----   Message from [facility] Plotnikov sent at 09/03/2022 12:03 AM EDT ----- Valentina Gu, can we ask the lab to add a D-dimer and a CPK to the lab work?  Thank you

## 2022-09-03 NOTE — Telephone Encounter (Signed)
Faxed../lb

## 2022-09-03 NOTE — Telephone Encounter (Signed)
Faxed to [email or web address] KitchenRaechel Obrien

## 2022-09-04 ENCOUNTER — Encounter: Payer: Self-pay | Admitting: Internal Medicine

## 2022-09-04 ENCOUNTER — Other Ambulatory Visit: Payer: Medicare Other

## 2022-09-04 ENCOUNTER — Ambulatory Visit (HOSPITAL_COMMUNITY): Payer: Medicare Other | Attending: Internal Medicine

## 2022-09-04 ENCOUNTER — Other Ambulatory Visit: Payer: Self-pay | Admitting: Internal Medicine

## 2022-09-04 DIAGNOSIS — R7989 Other specified abnormal findings of blood chemistry: Secondary | ICD-10-CM

## 2022-09-04 DIAGNOSIS — R0609 Other forms of dyspnea: Secondary | ICD-10-CM

## 2022-09-04 LAB — ECHOCARDIOGRAM COMPLETE
Area-P 1/2: 2.59 cm2
S' Lateral: 3.1 cm

## 2022-09-04 LAB — D-DIMER, QUANTITATIVE: D-Dimer, Quant: 14.27 ug{FEU}/mL — ABNORMAL HIGH (ref <0.50)

## 2022-09-04 MED ORDER — RIVAROXABAN 15 MG PO TABS: 15.0000 mg | ORAL_TABLET | Freq: Two times a day (BID) | ORAL | 0 refills | Status: DC

## 2022-09-05 ENCOUNTER — Ambulatory Visit: Payer: Medicare Other | Admitting: Internal Medicine

## 2022-09-05 ENCOUNTER — Ambulatory Visit (HOSPITAL_BASED_OUTPATIENT_CLINIC_OR_DEPARTMENT_OTHER)
Admission: RE | Admit: 2022-09-05 | Discharge: 2022-09-05 | Disposition: A | Discharge status: Home / Self Care | Payer: Medicare Other | Source: Ambulatory Visit | Attending: Internal Medicine | Admitting: Internal Medicine

## 2022-09-05 DIAGNOSIS — R7989 Other specified abnormal findings of blood chemistry: Secondary | ICD-10-CM | POA: Diagnosis present

## 2022-09-05 DIAGNOSIS — R0609 Other forms of dyspnea: Secondary | ICD-10-CM | POA: Insufficient documentation

## 2022-09-05 MED ORDER — IOHEXOL 350 MG/ML SOLN
100.0000 mL | Freq: Once | INTRAVENOUS | Status: AC | PRN
  Administered 2022-09-05: 100 mL via INTRAVENOUS

## 2022-09-10 ENCOUNTER — Telehealth: Payer: Self-pay | Admitting: Internal Medicine

## 2022-09-10 NOTE — Telephone Encounter (Signed)
Patient called about Dr. Loren Racer message about needing a biopsy. He said he would be okay with getting the incision done. He said he can do it prior to 09/20/22. If that doesn't work, it would need to be in early October. Best callback is [phone].

## 2022-09-11 NOTE — Telephone Encounter (Addendum)
Pt was informed of Dr. Tommy Medal suggestion to schedule his Biopsy apptmnt...  Via Erin "Patient called about Dr. Loren Racer message about needing a biopsy. He said he would be okay with getting the incision done. He said he can do it prior to 09/20/22. If that doesn't work, it would need to be in early October."

## 2022-09-11 NOTE — Telephone Encounter (Signed)
Patient called back and is needing to know when this can be done - Please call patient  ASAP -

## 2022-09-12 ENCOUNTER — Other Ambulatory Visit: Payer: Self-pay | Admitting: Internal Medicine

## 2022-09-12 DIAGNOSIS — R591 Generalized enlarged lymph nodes: Secondary | ICD-10-CM

## 2022-09-12 NOTE — Telephone Encounter (Signed)
Noted Referral to General Surgery was made Thx

## 2022-09-13 NOTE — Telephone Encounter (Signed)
LDVM for pt that Dr. Posey Rea has sent in a referral to General Surgery has been made and they will reach out to the pt to get this apptmnt scheduled.

## 2022-10-10 ENCOUNTER — Other Ambulatory Visit (HOSPITAL_COMMUNITY): Payer: Self-pay | Admitting: General Surgery

## 2022-10-10 DIAGNOSIS — R590 Localized enlarged lymph nodes: Secondary | ICD-10-CM

## 2022-10-11 NOTE — Progress Notes (Unsigned)
Oley Balm, MD  Leodis Rains D PROCEDURE / BIOPSY REVIEW Date: 10/11/22  Requested Biopsy site: ax LAN Reason for request: ax LAN Imaging review: Best seen on CT 09/05/22  Decision: Approved Imaging modality to perform: Ultrasound Schedule with: Patient preference (Local vs Mod Sed) Schedule for: Any VIR  Additional comments:   Please contact me with questions, concerns, or if issue pertaining to this request arise.  Dayne Oley Balm, MD [facility]

## 2022-10-15 ENCOUNTER — Ambulatory Visit (HOSPITAL_COMMUNITY)
Admission: RE | Admit: 2022-10-15 | Discharge: 2022-10-15 | Disposition: A | Discharge status: Home / Self Care | Payer: Medicare Other | Source: Ambulatory Visit | Attending: General Surgery | Admitting: General Surgery

## 2022-10-15 DIAGNOSIS — R590 Localized enlarged lymph nodes: Secondary | ICD-10-CM | POA: Diagnosis present

## 2022-10-15 MED ORDER — LIDOCAINE HCL (PF) 1 % IJ SOLN
6.0000 mL | Freq: Once | INTRAMUSCULAR | Status: AC
  Administered 2022-10-15: 6 mL via INTRADERMAL

## 2022-10-15 NOTE — Procedures (Signed)
Interventional Radiology Procedure Note  Procedure: US guided biopsy of left axillary lymph node  Indication: Diffuse Lymphadenopathy  Findings: Please refer to procedural dictation for full description.  Complications: None  EBL: < 10 mL  Acquanetta Belling, MD [phone]

## 2022-10-21 ENCOUNTER — Encounter: Payer: Self-pay | Admitting: Internal Medicine

## 2022-10-21 LAB — SURGICAL PATHOLOGY

## 2022-10-31 ENCOUNTER — Telehealth: Payer: Self-pay | Admitting: Internal Medicine

## 2022-10-31 DIAGNOSIS — Z889 Allergy status to unspecified drugs, medicaments and biological substances status: Secondary | ICD-10-CM

## 2022-10-31 DIAGNOSIS — D649 Anemia, unspecified: Secondary | ICD-10-CM

## 2022-10-31 DIAGNOSIS — R21 Rash and other nonspecific skin eruption: Secondary | ICD-10-CM

## 2022-10-31 NOTE — Telephone Encounter (Signed)
Pt called wanting test ordered from [facility] to have it done here instead of [place] so he wont have to drive that far. I was not sure what kind of test that was ordered. please advise

## 2022-11-04 NOTE — Telephone Encounter (Signed)
Yes. However, I need to know what was planned to be ordered.  I am not able to see what labs were ordered at [facility]  Thank you

## 2022-11-04 NOTE — Telephone Encounter (Signed)
Pt has faxed over orders from [facility] and Carollee Herter has place orders in Dr. Macario Golds box from the fax queue. Please advise.

## 2022-11-05 ENCOUNTER — Other Ambulatory Visit: Payer: Self-pay | Admitting: Internal Medicine

## 2022-11-05 DIAGNOSIS — G8929 Other chronic pain: Secondary | ICD-10-CM

## 2022-11-05 NOTE — Telephone Encounter (Signed)
Order's have been given the orders.

## 2022-11-06 NOTE — Telephone Encounter (Signed)
Patient called and said he is very upset that he has not received a call from Dr. Loren Racer office about ordering the labs. He said he would like to speak with the nurse about the labs. Best callback is [phone].

## 2022-11-07 ENCOUNTER — Other Ambulatory Visit: Payer: Medicare Other

## 2022-11-07 DIAGNOSIS — R21 Rash and other nonspecific skin eruption: Secondary | ICD-10-CM

## 2022-11-07 DIAGNOSIS — D649 Anemia, unspecified: Secondary | ICD-10-CM

## 2022-11-07 NOTE — Telephone Encounter (Signed)
LDVM for pt informing him that the labs he needed ordered have been ordered by Dr. Demetrius Charity. Pt can go into the [facility] location lab ASAP to have labs drawn before he leaves to [facility].

## 2022-11-07 NOTE — Addendum Note (Signed)
Addended by: Tresa Garter on: 11/07/2022 07:44 AM   Modules accepted: Orders

## 2022-11-07 NOTE — Telephone Encounter (Signed)
Fax received.  Labs ordered.  Thank you

## 2022-11-08 LAB — EXTRACTABLE NUCLEAR ANTIGEN ANTIBODY
ENA RNP Ab: <0.2 AI (ref 0.0–0.9)
ENA SM Ab Ser-aCnc: <0.2 AI (ref 0.0–0.9)
ENA SSA (RO) Ab: <0.2 AI (ref 0.0–0.9)
ENA SSB (LA) Ab: <0.2 AI (ref 0.0–0.9)
Scleroderma (Scl-70) (ENA) Antibody, IgG: <0.2 AI (ref 0.0–0.9)
dsDNA Ab: <1 [IU]/mL (ref 0–9)

## 2022-11-09 LAB — PROTEIN ELECTROPHORESIS, SERUM
Albumin ELP: 3.2 g/dL — ABNORMAL LOW (ref 3.8–4.8)
Alpha 1: 0.4 g/dL — ABNORMAL HIGH (ref 0.2–0.3)
Alpha 2: 1 g/dL — ABNORMAL HIGH (ref 0.5–0.9)
Beta 2: 0.3 g/dL (ref 0.2–0.5)
Beta Globulin: 0.4 g/dL (ref 0.4–0.6)
Gamma Globulin: 0.9 g/dL (ref 0.8–1.7)
Total Protein: 6.3 g/dL (ref 6.1–8.1)

## 2022-11-09 LAB — CYCLIC CITRUL PEPTIDE ANTIBODY, IGG: Cyclic Citrullin Peptide Ab: <16 U

## 2022-11-09 LAB — RHEUMATOID FACTOR: Rheumatoid fact SerPl-aCnc: 11 [IU]/mL (ref <14)

## 2022-11-09 LAB — ANA: Anti Nuclear Antibody (ANA): NEGATIVE

## 2022-11-09 LAB — PATHOLOGIST SMEAR REVIEW

## 2022-12-10 LAB — COLOGUARD: COLOGUARD: NEGATIVE

## 2022-12-23 ENCOUNTER — Encounter: Payer: Self-pay | Admitting: Internal Medicine

## 2022-12-25 ENCOUNTER — Other Ambulatory Visit: Payer: Self-pay | Admitting: Internal Medicine

## 2022-12-25 DIAGNOSIS — Z7289 Other problems related to lifestyle: Secondary | ICD-10-CM

## 2022-12-25 DIAGNOSIS — D649 Anemia, unspecified: Secondary | ICD-10-CM

## 2022-12-25 DIAGNOSIS — R6883 Chills (without fever): Secondary | ICD-10-CM

## 2022-12-25 DIAGNOSIS — Z113 Encounter for screening for infections with a predominantly sexual mode of transmission: Secondary | ICD-10-CM

## 2022-12-25 DIAGNOSIS — R591 Generalized enlarged lymph nodes: Secondary | ICD-10-CM

## 2022-12-25 DIAGNOSIS — Z114 Encounter for screening for human immunodeficiency virus [HIV]: Secondary | ICD-10-CM

## 2022-12-25 DIAGNOSIS — L309 Dermatitis, unspecified: Secondary | ICD-10-CM

## 2022-12-25 DIAGNOSIS — R202 Paresthesia of skin: Secondary | ICD-10-CM

## 2022-12-25 DIAGNOSIS — R509 Fever, unspecified: Secondary | ICD-10-CM

## 2022-12-25 NOTE — Telephone Encounter (Signed)
Labs have been ordered

## 2022-12-25 NOTE — Progress Notes (Signed)
Labs ordered.

## 2022-12-25 NOTE — Telephone Encounter (Signed)
Patient provided labs that need to be done below:  Protein electrophoresis urine Immunofixation electrophoresis urine(IFE) Immunofixation electrophoresis(IFE) serum Rapid HIV 1/2 AB and AG Hemoglobin A1C Neutrophil cytoplasmic AB SCRN CBC with differential Comprehensive metabolic panel(CMP)

## 2022-12-26 ENCOUNTER — Other Ambulatory Visit (INDEPENDENT_AMBULATORY_CARE_PROVIDER_SITE_OTHER): Payer: Medicare Other

## 2022-12-26 DIAGNOSIS — R591 Generalized enlarged lymph nodes: Secondary | ICD-10-CM | POA: Diagnosis not present

## 2022-12-26 DIAGNOSIS — R202 Paresthesia of skin: Secondary | ICD-10-CM | POA: Diagnosis not present

## 2022-12-26 DIAGNOSIS — L309 Dermatitis, unspecified: Secondary | ICD-10-CM

## 2022-12-26 DIAGNOSIS — D649 Anemia, unspecified: Secondary | ICD-10-CM

## 2022-12-26 LAB — COMPREHENSIVE METABOLIC PANEL
ALT: 20 U/L (ref 0–53)
AST: 16 U/L (ref 0–37)
Albumin: 3.6 g/dL (ref 3.5–5.2)
Alkaline Phosphatase: 55 U/L (ref 39–117)
BUN: 21 mg/dL (ref 6–23)
CO2: 28 meq/L (ref 19–32)
Calcium: 8.6 mg/dL (ref 8.4–10.5)
Chloride: 102 meq/L (ref 96–112)
Creatinine, Ser: 1.13 mg/dL (ref 0.40–1.50)
GFR: 64.02 mL/min (ref >60.00)
Glucose, Bld: 106 mg/dL — ABNORMAL HIGH (ref 70–99)
Potassium: 4.4 meq/L (ref 3.5–5.1)
Sodium: 138 meq/L (ref 135–145)
Total Bilirubin: 0.5 mg/dL (ref 0.2–1.2)
Total Protein: 6.8 g/dL (ref 6.0–8.3)

## 2022-12-26 LAB — CBC WITH DIFFERENTIAL/PLATELET
Basophils Absolute: 0.1 10*3/uL (ref 0.0–0.1)
Basophils Relative: 0.9 % (ref 0.0–3.0)
Eosinophils Absolute: 0.4 10*3/uL (ref 0.0–0.7)
Eosinophils Relative: 5.8 % — ABNORMAL HIGH (ref 0.0–5.0)
HCT: 41.4 % (ref 39.0–52.0)
Hemoglobin: 13.1 g/dL (ref 13.0–17.0)
Lymphocytes Relative: 24.9 % (ref 12.0–46.0)
Lymphs Abs: 1.8 10*3/uL (ref 0.7–4.0)
MCHC: 31.6 g/dL (ref 30.0–36.0)
MCV: 77.7 fL — ABNORMAL LOW (ref 78.0–100.0)
Monocytes Absolute: 0.6 10*3/uL (ref 0.1–1.0)
Monocytes Relative: 8.8 % (ref 3.0–12.0)
Neutro Abs: 4.3 10*3/uL (ref 1.4–7.7)
Neutrophils Relative %: 59.6 % (ref 43.0–77.0)
Platelets: 457 10*3/uL — ABNORMAL HIGH (ref 150.0–400.0)
RBC: 5.33 Mil/uL (ref 4.22–5.81)
RDW: 16.6 % — ABNORMAL HIGH (ref 11.5–15.5)
WBC: 7.2 10*3/uL (ref 4.0–10.5)

## 2022-12-27 ENCOUNTER — Encounter: Payer: Self-pay | Admitting: Internal Medicine

## 2022-12-30 LAB — HIV ANTIBODY (ROUTINE TESTING W REFLEX): HIV 1&2 Ab, 4th Generation: NONREACTIVE

## 2022-12-30 LAB — MPO/PR-3 (ANCA) ANTIBODIES
Myeloperoxidase Abs: <1 AI (ref <1.0)
Serine Protease 3: <1 AI (ref <1.0)

## 2023-01-08 LAB — IFE AND PE, RANDOM URINE
% BETA, Urine: 23.6 %
ALBUMIN, U: 43.5 %
ALPHA 1 URINE: 6.4 %
ALPHA-2-GLOBULIN, U: 17 %
GAMMA GLOBULIN URINE: 9.5 %
Protein, Ur: 11.2 mg/dL

## 2023-01-08 LAB — IFE, PE AND FLC, SERUM
Albumin SerPl Elph-Mcnc: 2.9 g/dL (ref 2.9–4.4)
Albumin/Glob SerPl: 0.8 (ref 0.7–1.7)
Alpha 1: 0.4 g/dL (ref 0.0–0.4)
Alpha2 Glob SerPl Elph-Mcnc: 1.2 g/dL — ABNORMAL HIGH (ref 0.4–1.0)
B-Globulin SerPl Elph-Mcnc: 0.9 g/dL (ref 0.7–1.3)
Gamma Glob SerPl Elph-Mcnc: 1.2 g/dL (ref 0.4–1.8)
Globulin, Total: 3.7 g/dL (ref 2.2–3.9)
IgA/Immunoglobulin A, Serum: 248 mg/dL (ref 61–437)
IgG (Immunoglobin G), Serum: 1180 mg/dL (ref 603–1613)
IgM (Immunoglobulin M), Srm: 38 mg/dL (ref 15–143)
Total Protein: 6.6 g/dL (ref 6.0–8.5)

## 2023-01-13 ENCOUNTER — Other Ambulatory Visit (HOSPITAL_COMMUNITY): Payer: Self-pay

## 2023-01-13 ENCOUNTER — Telehealth: Payer: Self-pay | Admitting: Pharmacy Technician

## 2023-01-13 NOTE — Telephone Encounter (Signed)
 Pharmacy Patient Advocate Encounter   Received notification from CoverMyMeds that prior authorization for Testosterone  Cypionate 200MG /ML intramuscular solution is required/requested.   Insurance verification completed.   The patient is insured through [employer] .   Per test claim: PA required; PA submitted to above mentioned insurance via CoverMyMeds Key/confirmation #/EOC Testosterone  Cypionate 200MG /ML intramuscular solution Status is pending

## 2023-01-14 ENCOUNTER — Other Ambulatory Visit (HOSPITAL_COMMUNITY): Payer: Self-pay

## 2023-01-14 NOTE — Telephone Encounter (Signed)
 Pharmacy Patient Advocate Encounter  Received notification from [employer] that Prior Authorization for Testosterone  Cypionate 200MG /ML intramuscular solution  has been APPROVED from 01/12/2023 to 01/07/2024. Unable to obtain price due to refill too soon rejection, last fill date 01/12/2023 next available fill date03/05/2023   PA #/Case ID/Reference #: PA Case ID #: [id number]  Key: [facility]

## 2023-03-23 ENCOUNTER — Other Ambulatory Visit: Payer: Self-pay | Admitting: Internal Medicine

## 2023-05-07 ENCOUNTER — Ambulatory Visit (INDEPENDENT_AMBULATORY_CARE_PROVIDER_SITE_OTHER): Admitting: Internal Medicine

## 2023-05-07 ENCOUNTER — Encounter: Payer: Self-pay | Admitting: Internal Medicine

## 2023-05-07 VITALS — BP 102/70 | HR 81 | Temp 98.3°F | Ht 73.0 in | Wt 239.4 lb

## 2023-05-07 DIAGNOSIS — M25512 Pain in left shoulder: Secondary | ICD-10-CM

## 2023-05-07 DIAGNOSIS — R61 Generalized hyperhidrosis: Secondary | ICD-10-CM

## 2023-05-07 DIAGNOSIS — G8929 Other chronic pain: Secondary | ICD-10-CM

## 2023-05-07 DIAGNOSIS — E785 Hyperlipidemia, unspecified: Secondary | ICD-10-CM | POA: Diagnosis not present

## 2023-05-07 DIAGNOSIS — L239 Allergic contact dermatitis, unspecified cause: Secondary | ICD-10-CM

## 2023-05-07 DIAGNOSIS — N401 Enlarged prostate with lower urinary tract symptoms: Secondary | ICD-10-CM | POA: Diagnosis not present

## 2023-05-07 DIAGNOSIS — E291 Testicular hypofunction: Secondary | ICD-10-CM

## 2023-05-07 DIAGNOSIS — Z0000 Encounter for general adult medical examination without abnormal findings: Secondary | ICD-10-CM

## 2023-05-07 DIAGNOSIS — R350 Frequency of micturition: Secondary | ICD-10-CM

## 2023-05-07 DIAGNOSIS — T50905D Adverse effect of unspecified drugs, medicaments and biological substances, subsequent encounter: Secondary | ICD-10-CM

## 2023-05-07 LAB — URINALYSIS
Bilirubin Urine: NEGATIVE
Hgb urine dipstick: NEGATIVE
Ketones, ur: NEGATIVE
Leukocytes,Ua: NEGATIVE
Nitrite: NEGATIVE
Specific Gravity, Urine: 1.01 (ref 1.000–1.030)
Total Protein, Urine: NEGATIVE
Urine Glucose: NEGATIVE
Urobilinogen, UA: 0.2 (ref 0.0–1.0)
pH: 6 (ref 5.0–8.0)

## 2023-05-07 LAB — CBC WITH DIFFERENTIAL/PLATELET
Basophils Absolute: 0 10*3/uL (ref 0.0–0.1)
Basophils Relative: 0.4 % (ref 0.0–3.0)
Eosinophils Absolute: 0.3 10*3/uL (ref 0.0–0.7)
Eosinophils Relative: 3.6 % (ref 0.0–5.0)
HCT: 43 % (ref 39.0–52.0)
Hemoglobin: 13.7 g/dL (ref 13.0–17.0)
Lymphocytes Relative: 19.6 % (ref 12.0–46.0)
Lymphs Abs: 1.4 10*3/uL (ref 0.7–4.0)
MCHC: 31.9 g/dL (ref 30.0–36.0)
MCV: 77.4 fl — ABNORMAL LOW (ref 78.0–100.0)
Monocytes Absolute: 0.8 10*3/uL (ref 0.1–1.0)
Monocytes Relative: 10.3 % (ref 3.0–12.0)
Neutro Abs: 4.8 10*3/uL (ref 1.4–7.7)
Neutrophils Relative %: 66.1 % (ref 43.0–77.0)
Platelets: 461 10*3/uL — ABNORMAL HIGH (ref 150.0–400.0)
RBC: 5.55 Mil/uL (ref 4.22–5.81)
RDW: 18.1 % — ABNORMAL HIGH (ref 11.5–15.5)
WBC: 7.3 10*3/uL (ref 4.0–10.5)

## 2023-05-07 LAB — TESTOSTERONE: Testosterone: 1011.32 ng/dL — ABNORMAL HIGH (ref 300.00–890.00)

## 2023-05-07 LAB — SEDIMENTATION RATE: Sed Rate: 67 mm/h — ABNORMAL HIGH (ref 0–20)

## 2023-05-07 LAB — C-REACTIVE PROTEIN: CRP: 10 mg/dL (ref 0.5–20.0)

## 2023-05-07 LAB — COMPREHENSIVE METABOLIC PANEL WITH GFR
ALT: 19 U/L (ref 0–53)
AST: 16 U/L (ref 0–37)
Albumin: 3.8 g/dL (ref 3.5–5.2)
Alkaline Phosphatase: 67 U/L (ref 39–117)
BUN: 19 mg/dL (ref 6–23)
CO2: 29 meq/L (ref 19–32)
Calcium: 8.9 mg/dL (ref 8.4–10.5)
Chloride: 100 meq/L (ref 96–112)
Creatinine, Ser: 1.27 mg/dL (ref 0.40–1.50)
GFR: 55.51 mL/min — ABNORMAL LOW (ref >60.00)
Glucose, Bld: 93 mg/dL (ref 70–99)
Potassium: 4.9 meq/L (ref 3.5–5.1)
Sodium: 138 meq/L (ref 135–145)
Total Bilirubin: 0.5 mg/dL (ref 0.2–1.2)
Total Protein: 6.8 g/dL (ref 6.0–8.3)

## 2023-05-07 LAB — LIPID PANEL
Cholesterol: 158 mg/dL (ref 0–200)
HDL: 45.2 mg/dL (ref >39.00)
LDL Cholesterol: 98 mg/dL (ref 0–99)
NonHDL: 112.35
Total CHOL/HDL Ratio: 3
Triglycerides: 72 mg/dL (ref 0.0–149.0)
VLDL: 14.4 mg/dL (ref 0.0–40.0)

## 2023-05-07 LAB — TSH: TSH: 0.95 u[IU]/mL (ref 0.35–5.50)

## 2023-05-07 LAB — PSA: PSA: 8.44 ng/mL — ABNORMAL HIGH (ref 0.10–4.00)

## 2023-05-07 MED ORDER — GLYCOPYRROLATE 2 MG PO TABS: 2.0000 mg | ORAL_TABLET | Freq: Three times a day (TID) | ORAL | 3 refills | Status: DC | PRN

## 2023-05-07 MED ORDER — CEFADROXIL 500 MG PO CAPS: 500.0000 mg | ORAL_CAPSULE | Freq: Two times a day (BID) | ORAL | 1 refills | Status: DC

## 2023-05-07 MED ORDER — CETIRIZINE HCL 10 MG PO TABS
10.0000 mg | ORAL_TABLET | Freq: Every day | ORAL | Status: DC
Start: 2023-05-07 — End: 2023-07-28

## 2023-05-07 MED ORDER — DRYSOL 20 % EX SOLN: Freq: Every day | CUTANEOUS | 3 refills | Status: DC

## 2023-05-07 NOTE — Assessment & Plan Note (Signed)
 Focal chest area - see pictures Drysol bid Robinul po prn

## 2023-05-07 NOTE — Assessment & Plan Note (Signed)
 We discussed age appropriate health related issues, including available/recomended screening tests and vaccinations. We discussed a need for adhering to healthy diet and exercise. Labs ordered. All questions were answered. CT Ca score - will schedule again Pt had a chest CT at his sister's 2009 ---Ca score was 0.  Rect per Urology q 12 mo Derm, Ophth  q 12 mo Cologuard due in 2024 2025 - PET CT at [facility]

## 2023-05-07 NOTE — Assessment & Plan Note (Addendum)
  03/2023 [facility] PET CT MPRESSION:  1. Hypermetabolic adenopathy in the chest, concerning for malignancy. Largest and most avid nodes noted in the bilateral axilla. Tissue sampling recommended.  2. Prominent uptake at the distal esophagus. Within the limitations of non contrast CT, this is without gross anatomic abnormality. Activity here is nonspecific and could be inflammatory or related to spasm. Malignancy is not favored but not fully excluded. Consider endoscopy for definitive assessment, otherwise consider contrast enhanced CT to further evaluate.  3. Bilateral shoulder and knee arthroplasties. There are associated joint effusions with non-focal hypermetabolic activity. The left shoulder effusion is the largest. Most intense uptake seen associated with the right knee effusion. This activity is nonspecific, but favored inflammatory. Consider fluid sampling as clinically warranted.  4. Evidence of right maxillary sinusitis. Some activity above blood pool in small cervical nodes is favored reactive. Recommend correlation with clinical exam and patient symptoms. Attention to this area on follow-up scans is recommended.   Electronically Signed by:  Fabio Holts, MD, [facility] Electronically Signed on:  03/21/2023 9:16 AM Procedure Note  04/2023 LN bx: A. Lymph node, right axillary, excision:   Reactive lymph node tissue with intra-sinusoidal histiocytic proliferation. No morphologic and immunophenotypic support for lymphoma. See comment.

## 2023-05-07 NOTE — Progress Notes (Signed)
 Subjective:  Patient ID: Chris Obrien, male    DOB: [birth year]  Age: 73 y.o. MRN: [id number]  CC: Medical Management of Chronic Issues (Annual Exam)   HPI Subhaan Estep Urbanik presents for a well exam  C/o sweating over rash all night C/o rash C/o arthralgias           Outpatient Medications Prior to Visit  Medication Sig Dispense Refill   B Complex-C-Folic Acid  (B COMPLEX-VITAMIN C-FOLIC ACID ) 1 MG tablet Take 1 tablet by mouth daily with breakfast. 100 tablet 3   B-D 3CC LUER-LOK SYR 23GX1-1/2 23G X 1-1/2" 3 ML MISC As dirrected IM 50 each 3   Cholecalciferol (VITAMIN D3) 50 MCG (2000 UT) capsule Take 2 capsules (4,000 Units total) by mouth daily. 100 capsule 3   mometasone  (ELOCON ) 0.1 % ointment Apply topically daily. 45 g 0   tamsulosin  (FLOMAX ) 0.4 MG CAPS capsule Take by mouth.     testosterone  cypionate (DEPOTESTOSTERONE CYPIONATE) 200 MG/ML injection Inject 0.5 mLs (100 mg total) into the muscle every 7 (seven) days. 10 mL 0   zolpidem  (AMBIEN ) 10 MG tablet Take 1 tablet (10 mg total) by mouth at bedtime as needed for sleep. 30 tablet 1   cefadroxil  (DURICEF) 500 MG capsule TAKE ONE CAPSULE BY MOUTH TWICE A DAY 28 capsule 1   albuterol  (VENTOLIN  HFA) 108 (90 Base) MCG/ACT inhaler Inhale 2 puffs into the lungs every 4 (four) hours as needed for wheezing or shortness of breath. 24 g 5   diphenoxylate -atropine  (LOMOTIL ) 2.5-0.025 MG tablet Take 1 tablet by mouth 4 (four) times daily as needed for diarrhea or loose stools. 60 tablet 1   Rivaroxaban  (XARELTO ) 15 MG TABS tablet Take 1 tablet (15 mg total) by mouth 2 (two) times daily with a meal. 42 tablet 0   No facility-administered medications prior to visit.    ROS: Review of Systems  Constitutional:  Negative for appetite change, fatigue and unexpected weight change.  HENT:  Negative for congestion, nosebleeds, sneezing, sore throat and trouble swallowing.   Eyes:  Negative for itching and visual disturbance.   Respiratory:  Negative for cough.   Cardiovascular:  Negative for chest pain, palpitations and leg swelling.  Gastrointestinal:  Negative for abdominal distention, blood in stool, diarrhea and nausea.  Genitourinary:  Negative for frequency and hematuria.  Musculoskeletal:  Negative for back pain, gait problem, joint swelling and neck pain.  Skin:  Negative for rash.  Neurological:  Negative for dizziness, tremors, speech difficulty and weakness.  Psychiatric/Behavioral:  Negative for agitation, dysphoric mood and sleep disturbance. The patient is not nervous/anxious.     Objective:  BP 102/70   Pulse 81   Temp 98.3 F (36.8 C)   Ht 6\' 1"  (1.854 m)   Wt 239 lb 6.4 oz (108.6 kg)   SpO2 93%   BMI 31.59 kg/m   BP Readings from Last 3 Encounters:  05/07/23 102/70  09/02/22 (!) 146/92  07/08/22 (!) 160/86    Wt Readings from Last 3 Encounters:  05/07/23 239 lb 6.4 oz (108.6 kg)  09/02/22 234 lb (106.1 kg)  07/08/22 246 lb (111.6 kg)    Physical Exam Constitutional:      General: He is not in acute distress.    Appearance: He is well-developed. He is obese.     Comments: NAD  Eyes:     Conjunctiva/sclera: Conjunctivae normal.     Pupils: Pupils are equal, round, and reactive to light.  Neck:  Thyroid : No thyromegaly.     Vascular: No JVD.  Cardiovascular:     Rate and Rhythm: Normal rate and regular rhythm.     Heart sounds: Normal heart sounds. No murmur heard.    No friction rub. No gallop.  Pulmonary:     Effort: Pulmonary effort is normal. No respiratory distress.     Breath sounds: Normal breath sounds. No wheezing or rales.  Chest:     Chest wall: No tenderness.  Abdominal:     General: Bowel sounds are normal. There is no distension.     Palpations: Abdomen is soft. There is no mass.     Tenderness: There is no abdominal tenderness. There is no guarding or rebound.  Musculoskeletal:        General: No tenderness. Normal range of motion.     Cervical  back: Normal range of motion.  Lymphadenopathy:     Cervical: No cervical adenopathy.  Skin:    General: Skin is warm and dry.     Findings: No rash.  Neurological:     Mental Status: He is alert and oriented to person, place, and time.     Cranial Nerves: No cranial nerve deficit.     Motor: No abnormal muscle tone.     Coordination: Coordination normal.     Gait: Gait normal.     Deep Tendon Reflexes: Reflexes are normal and symmetric.  Psychiatric:        Behavior: Behavior normal.        Thought Content: Thought content normal.        Judgment: Judgment normal.   Prostate - pt declined  Lab Results  Component Value Date   WBC 7.2 12/26/2022   HGB 13.1 12/26/2022   HCT 41.4 12/26/2022   PLT 457.0 (H) 12/26/2022   GLUCOSE 106 (H) 12/26/2022   CHOL 142 11/25/2019   TRIG 55.0 11/25/2019   HDL 43.00 11/25/2019   LDLDIRECT 130.9 12/08/2012   LDLCALC 88 11/25/2019   ALT 20 12/26/2022   AST 16 12/26/2022   NA 138 12/26/2022   K 4.4 12/26/2022   CL 102 12/26/2022   CREATININE 1.13 12/26/2022   BUN 21 12/26/2022   CO2 28 12/26/2022   TSH 0.93 09/02/2022   PSA 3.94 10/04/2021   INR 1.09 06/03/2012   HGBA1C 5.4 05/23/2020    US  CORE BIOPSY (LYMPH NODES) Result Date: 10/15/2022 INDICATION: Axillary lymphadenopathy EXAM: Ultrasound-guided biopsy of left axillary lymph node MEDICATIONS: None. ANESTHESIA/SEDATION: None COMPLICATIONS: None immediate. PROCEDURE: Informed written consent was obtained from the patient after a thorough discussion of the procedural risks, benefits and alternatives. All questions were addressed. Maximal Sterile Barrier Technique was utilized including caps, mask, sterile gowns, sterile gloves, sterile drape, hand hygiene and skin antiseptic. A timeout was performed prior to the initiation of the procedure. Patient position supine on the ultrasound table. Left axillary skin prepped and draped in usual sterile fashion. Following local lidocaine   administration, 17 gauge introducer needle was advanced into 1 of the enlarged left axillary lymph nodes, and 6- 18 gauge cores were obtained utilizing continuous ultrasound guidance. Samples were sent to pathology in sterile saline. Needle removed and hemostasis achieved with 2 minutes of manual compression. Post procedure ultrasound images showed no evidence of significant hemorrhage. IMPRESSION: Ultrasound-guided biopsy of enlarged left axillary lymph node. Electronically Signed   By: Elester Grim M.D.   On: 10/15/2022 15:20    03/2023 [facility] PET CT MPRESSION:  1. Hypermetabolic adenopathy in the chest,  concerning for malignancy. Largest and most avid nodes noted in the bilateral axilla. Tissue sampling recommended.  2. Prominent uptake at the distal esophagus. Within the limitations of non contrast CT, this is without gross anatomic abnormality. Activity here is nonspecific and could be inflammatory or related to spasm. Malignancy is not favored but not fully excluded. Consider endoscopy for definitive assessment, otherwise consider contrast enhanced CT to further evaluate.  3. Bilateral shoulder and knee arthroplasties. There are associated joint effusions with non-focal hypermetabolic activity. The left shoulder effusion is the largest. Most intense uptake seen associated with the right knee effusion. This activity is nonspecific, but favored inflammatory. Consider fluid sampling as clinically warranted.  4. Evidence of right maxillary sinusitis. Some activity above blood pool in small cervical nodes is favored reactive. Recommend correlation with clinical exam and patient symptoms. Attention to this area on follow-up scans is recommended.   Electronically Signed by:  Fabio Holts, MD, [facility] Electronically Signed on:  03/21/2023 9:16 AM Procedure Note  04/2023 LN bx: A. Lymph node, right axillary, excision:   Reactive lymph node tissue with intra-sinusoidal histiocytic  proliferation. No morphologic and immunophenotypic support for lymphoma. See comment.   Assessment & Plan:   Problem List Items Addressed This Visit     Well adult exam   We discussed age appropriate health related issues, including available/recomended screening tests and vaccinations. We discussed a need for adhering to healthy diet and exercise. Labs ordered. All questions were answered. CT Ca score - will schedule again Pt had a chest CT at his sister's 2009 ---Ca score was 0.  Rect per Urology q 12 mo Derm, Ophth  q 12 mo Cologuard due in 2024 2025 - PET CT at [facility]      Relevant Orders   TSH   Urinalysis   CBC with Differential/Platelet   Lipid panel   PSA   BPH (benign prostatic hyperplasia)   Relevant Orders   PSA   Drug reaction   Relevant Orders   Comprehensive metabolic panel with GFR   Testosterone    Sedimentation rate   C-reactive protein   Chronic left shoulder pain   Relevant Medications   cefadroxil  (DURICEF) 500 MG capsule   Other Relevant Orders   Comprehensive metabolic panel with GFR   Sedimentation rate   C-reactive protein   Allergic dermatitis    03/2023 [facility] PET CT MPRESSION:  1. Hypermetabolic adenopathy in the chest, concerning for malignancy. Largest and most avid nodes noted in the bilateral axilla. Tissue sampling recommended.  2. Prominent uptake at the distal esophagus. Within the limitations of non contrast CT, this is without gross anatomic abnormality. Activity here is nonspecific and could be inflammatory or related to spasm. Malignancy is not favored but not fully excluded. Consider endoscopy for definitive assessment, otherwise consider contrast enhanced CT to further evaluate.  3. Bilateral shoulder and knee arthroplasties. There are associated joint effusions with non-focal hypermetabolic activity. The left shoulder effusion is the largest. Most intense uptake seen associated with the right knee effusion. This activity is  nonspecific, but favored inflammatory. Consider fluid sampling as clinically warranted.  4. Evidence of right maxillary sinusitis. Some activity above blood pool in small cervical nodes is favored reactive. Recommend correlation with clinical exam and patient symptoms. Attention to this area on follow-up scans is recommended.   Electronically Signed by:  Fabio Holts, MD, [facility] Electronically Signed on:  03/21/2023 9:16 AM Procedure Note  04/2023 LN bx: A. Lymph node, right axillary, excision:  Reactive lymph node tissue with intra-sinusoidal histiocytic proliferation. No morphologic and immunophenotypic support for lymphoma. See comment.       Night sweats - Primary   Focal chest area - see pictures Drysol bid Robinul po prn      Hypogonadism in male   Prescription  Testosterone  IM.  Potential benefits of a long term testosterone  use as well as potential risks (BPH, MI, OSA, cancer)  and complications were explained to the patient and were aknowledged.       Relevant Orders   TSH   Urinalysis   CBC with Differential/Platelet   Lipid panel   PSA   Comprehensive metabolic panel with GFR   Testosterone    Other Visit Diagnoses       Dyslipidemia       Relevant Orders   Lipid panel         Meds ordered this encounter  Medications   cefadroxil  (DURICEF) 500 MG capsule    Sig: Take 1 capsule (500 mg total) by mouth 2 (two) times daily.    Dispense:  28 capsule    Refill:  1   cetirizine  (ZYRTEC  ALLERGY) 10 MG tablet    Sig: Take 1 tablet (10 mg total) by mouth daily.   glycopyrrolate (ROBINUL-FORTE) 2 MG tablet    Sig: Take 1 tablet (2 mg total) by mouth 3 (three) times daily as needed.    Dispense:  90 tablet    Refill:  3   aluminum  chloride (DRYSOL) 20 % external solution    Sig: Apply topically at bedtime. On chest    Dispense:  120 mL    Refill:  3      Follow-up: Return in about 6 months (around 11/06/2023) for a follow-up visit.  Anitra Barn, MD

## 2023-05-07 NOTE — Assessment & Plan Note (Signed)
 Prescription  Testosterone  IM.  Potential benefits of a long term testosterone  use as well as potential risks (BPH, MI, OSA, cancer)  and complications were explained to the patient and were aknowledged.

## 2023-05-08 ENCOUNTER — Telehealth: Payer: Self-pay | Admitting: Internal Medicine

## 2023-05-08 NOTE — Telephone Encounter (Unsigned)
 Copied from CRM [id number]. Topic: General - Other >> May 08, 2023  9:05 AM Adonis Hoot wrote: Reason for CRM: Herma Longest from [facility] called in stating that she seen patient in oncology. They would like  for him to have an upper endoscopy due to some of his findings.They would like  to know if he could have a referral placed for a GI doctor here in [place] that the patient doesn't have to travel all the way there again.He said that it was kind of too far for him.

## 2023-05-09 ENCOUNTER — Encounter: Payer: Self-pay | Admitting: Internal Medicine

## 2023-05-11 ENCOUNTER — Encounter: Payer: Self-pay | Admitting: Internal Medicine

## 2023-05-11 DIAGNOSIS — K219 Gastro-esophageal reflux disease without esophagitis: Secondary | ICD-10-CM | POA: Insufficient documentation

## 2023-05-12 ENCOUNTER — Encounter: Payer: Self-pay | Admitting: Internal Medicine

## 2023-05-12 ENCOUNTER — Other Ambulatory Visit: Payer: Self-pay | Admitting: Internal Medicine

## 2023-05-12 DIAGNOSIS — K219 Gastro-esophageal reflux disease without esophagitis: Secondary | ICD-10-CM

## 2023-05-12 DIAGNOSIS — R935 Abnormal findings on diagnostic imaging of other abdominal regions, including retroperitoneum: Secondary | ICD-10-CM

## 2023-05-12 NOTE — Telephone Encounter (Signed)
It has been taking care of.  Thanks

## 2023-05-15 NOTE — Progress Notes (Cosign Needed Addendum)
 This encounter was created in error - please disregard.  Called x3 w/no answer/ LDM for patient to call office back to reschedule visit.

## 2023-05-23 ENCOUNTER — Telehealth: Payer: Self-pay | Admitting: Gastroenterology

## 2023-05-23 NOTE — Telephone Encounter (Signed)
 Good afternoon Dr. Brice Campi,   We received a referral specifically over to you for patient to be seen for abnormal PET scan which showed distal esophageal uptake. Patient saw [facility] on 5/1. Referral states "This workup began due to an unexplained rash across his chest after shoulder surgery. He has no pertinent GI symptoms or family history. At this time, recommend EGD for further evaluation." Patient stated he believed he would be having a EGD with [facility] the day he was scheduled. States when he arrived they informed him it is a consultation. Patient stated she was frustrated and asked to be referred else where. Please review request and advise on scheduling further.   Thank you.

## 2023-05-25 NOTE — Telephone Encounter (Signed)
 May offer patient 5/27 or 6/10 or 6/17 for [facility] EGD to evaluate distal esophagus. Also, patient has history of prior Tubular Adenomas so should is overdue for colon cancer screening (though had done Cologuard per report in [facility] consultation) which would not normally be the type of followup that would be done for precancerous polyps.  That can be discussed in the future if he wishes for colon polyp surveillance and increased risk colon cancer screening. GM

## 2023-05-26 ENCOUNTER — Other Ambulatory Visit: Payer: Self-pay | Admitting: Internal Medicine

## 2023-05-26 DIAGNOSIS — G8929 Other chronic pain: Secondary | ICD-10-CM

## 2023-05-26 DIAGNOSIS — R197 Diarrhea, unspecified: Secondary | ICD-10-CM

## 2023-05-26 NOTE — Telephone Encounter (Signed)
 Copied from CRM [id number]. Topic: Clinical - Medication Refill >> May 26, 2023  3:08 PM Fredrica W wrote: Medication: celecoxib  (CELEBREX ) 200 MG capsule - would like to start again for joints   Has the patient contacted their pharmacy? Yes (Agent: If no, request that the patient contact the pharmacy for the refill. If patient does not wish to contact the pharmacy document the reason why and proceed with request.) (Agent: If yes, when and what did the pharmacy advise?) not able to fill - need from provider  This is the patient's preferred pharmacy:  [id number] [place], [place] - [address] [place]. AT [address] [place]. [place] [address] Phone: [phone] Fax: [phone]  Is this the correct pharmacy for this prescription? Yes If no, delete pharmacy and type the correct one.   Has the prescription been filled recently? No  Is the patient out of the medication? Yes  Has the patient been seen for an appointment in the last year OR does the patient have an upcoming appointment? Yes - forgot to mention at appt   Can we respond through MyChart? Yes  Agent: Please be advised that Rx refills may take up to 3 business days. We ask that you follow-up with your pharmacy.

## 2023-05-26 NOTE — Telephone Encounter (Signed)
 Patient has been scheduled for 5/27 [facility] EGD and 5/20 PV.

## 2023-05-27 ENCOUNTER — Ambulatory Visit (AMBULATORY_SURGERY_CENTER): Admitting: *Deleted

## 2023-05-27 ENCOUNTER — Telehealth: Payer: Self-pay | Admitting: *Deleted

## 2023-05-27 VITALS — Ht 73.0 in | Wt 245.0 lb

## 2023-05-27 DIAGNOSIS — R935 Abnormal findings on diagnostic imaging of other abdominal regions, including retroperitoneum: Secondary | ICD-10-CM

## 2023-05-27 NOTE — Telephone Encounter (Signed)
 Attempt to reach pt for pre-visit. LM with call back #.   Will attempt to reach again in 5 min due to no other # listed in profile  Second attempt to reached pt

## 2023-05-27 NOTE — Progress Notes (Signed)
 Pt's name and DOB verified at the beginning of the pre-visit wit 2 identifiers  Permission given to speak with  Pt denies any difficulty with ambulating,sitting, laying down or rolling side to side  Pt has no issues moving head neck or swallowing  No egg or soy allergy known to patient   No issues known to pt with past sedation with any surgeries or procedures  No FH of Malignant Hyperthermia  Pt is not on home 02   Pt is not on blood thinners   Pt denies issues with constipation   Pt is not on dialysis  Pt denise any abnormal heart rhythms   Pt denies any upcoming cardiac testing  Chart not reviewed by CRNA prior to PV  Visit by phone  Pt states weight is 245 lb   IInstructions reviewed. Pt given  both [facility] main # and MD on call # prior to instructions.  Pt states understanding of instructions. Instructed pt to review instructions again prior to procedure and call main # given if has questions.. Pt states they will.   Instructed pt on where to find instructions on My Chart.

## 2023-06-03 ENCOUNTER — Ambulatory Visit: Admitting: Gastroenterology

## 2023-06-03 ENCOUNTER — Encounter: Payer: Self-pay | Admitting: Gastroenterology

## 2023-06-03 VITALS — BP 127/76 | HR 78 | Temp 98.0°F | Resp 13 | Ht 73.0 in | Wt 245.0 lb

## 2023-06-03 DIAGNOSIS — Q402 Other specified congenital malformations of stomach: Secondary | ICD-10-CM

## 2023-06-03 DIAGNOSIS — Q399 Congenital malformation of esophagus, unspecified: Secondary | ICD-10-CM | POA: Diagnosis not present

## 2023-06-03 DIAGNOSIS — K31A0 Gastric intestinal metaplasia, unspecified: Secondary | ICD-10-CM

## 2023-06-03 DIAGNOSIS — K259 Gastric ulcer, unspecified as acute or chronic, without hemorrhage or perforation: Secondary | ICD-10-CM

## 2023-06-03 DIAGNOSIS — K3189 Other diseases of stomach and duodenum: Secondary | ICD-10-CM

## 2023-06-03 DIAGNOSIS — R935 Abnormal findings on diagnostic imaging of other abdominal regions, including retroperitoneum: Secondary | ICD-10-CM

## 2023-06-03 DIAGNOSIS — K449 Diaphragmatic hernia without obstruction or gangrene: Secondary | ICD-10-CM | POA: Diagnosis present

## 2023-06-03 MED ORDER — SODIUM CHLORIDE 0.9 % IV SOLN
500.0000 mL | Freq: Once | INTRAVENOUS | Status: DC
Start: 2023-06-03 — End: 2023-06-03

## 2023-06-03 MED ORDER — OMEPRAZOLE 40 MG PO CPDR: DELAYED_RELEASE_CAPSULE | ORAL | 3 refills | Status: DC

## 2023-06-03 MED ORDER — OMEPRAZOLE 40 MG PO CPDR: DELAYED_RELEASE_CAPSULE | ORAL | 6 refills | Status: DC

## 2023-06-03 NOTE — Progress Notes (Signed)
 GASTROENTEROLOGY PROCEDURE H&P NOTE   Primary Care Physician: Genia Kettering, MD  HPI: Chris Obrien is a 73 y.o. male who presents for EGD for abnormal PETCT of the esophagus.  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   Dermatitis 09/02/2022   GERD (gastroesophageal reflux disease)    rare    Insomnia, unspecified    Oligospermia    OSA on CPAP    PT LOST  WEIGHT - NO LONGER USING CPAP   Pain    RIGHT SHOULDER DELTOID MUSCLE ATROPHY - LOSS OF STRENGT VERY LIMITED RANGE OF MOTTION   Right knee skin infection    Sleep apnea    weight loss so no cpap now    Swelling of joint of right knee    Urticaria    Past Surgical History:  Procedure Laterality Date   ADENOIDECTOMY     COLONOSCOPY     EXCISIONAL TOTAL KNEE ARTHROPLASTY WITH ANTIBIOTIC SPACERS Right 04/27/2012   Procedure: EXCISIONAL TOTAL KNEE ARTHROPLASTY WITH ANTIBIOTIC SPACERS;  Surgeon: Bevin Bucks, MD;  Location: [facility];  Service: Orthopedics;  Laterality: Right;  SAME DAY LABS   IRRIGATION AND DEBRIDEMENT KNEE Right 04/02/2012   Procedure: IRRIGATION AND DEBRIDEMENT RIGHT PRE-PATELLA  ;  Surgeon: Bevin Bucks, MD;  Location: [facility];  Service: Orthopedics;  Laterality: Right;   KNEE ARTHROSCOPY Right aug 2012   POLYPECTOMY     SINOSCOPY     TONSILLECTOMY     TOTAL KNEE ARTHROPLASTY  01/28/2012   Procedure: TOTAL KNEE ARTHROPLASTY;  Surgeon: Bevin Bucks, MD;  Location: [facility];  Service: Orthopedics;  Laterality: Right;   TOTAL KNEE ARTHROPLASTY Left 07/17/2015   Procedure: LEFT TOTAL KNEE ARTHROPLASTY;  Surgeon: Claiborne Crew, MD;  Location: [facility];  Service: Orthopedics;  Laterality: Left;    TOTAL KNEE REVISION Right 06/15/2012   Procedure: reimplantation of right total knee ;  Surgeon: Bevin Bucks, MD;  Location: [facility];  Service: Orthopedics;  Laterality: Right;   TOTAL SHOULDER REPLACEMENT Bilateral    done in [facility]    ULNAR NERVE REPAIR Right 2008   Current Outpatient Medications  Medication Sig Dispense Refill   B Complex-C-Folic Acid  (B COMPLEX-VITAMIN C-FOLIC ACID ) 1 MG tablet Take 1 tablet by mouth daily with breakfast. 100 tablet 3   cefadroxil  (DURICEF) 500 MG capsule Take 1 capsule (500 mg total) by mouth 2 (two) times daily. (Patient taking differently: Take 500 mg by mouth daily.) 28 capsule 1   folic acid  (FOLVITE ) 1 MG tablet Take 1 mg by mouth.     Melatonin 10-10 MG TBCR Take by mouth. Bedtime     methotrexate (RHEUMATREX) 2.5 MG tablet Take 15 mg by mouth once a week.     Multiple Vitamin (MULTI-VITAMIN) tablet Take 1 tablet by mouth daily.     tamsulosin  (FLOMAX ) 0.4 MG CAPS capsule Take by mouth.     testosterone  cypionate (DEPOTESTOSTERONE CYPIONATE) 200 MG/ML injection Inject 0.5 mLs (100 mg total) into the muscle every 7 (seven) days. 10 mL 0   albuterol  (VENTOLIN  HFA) 108 (90 Base) MCG/ACT inhaler Inhale 2 puffs into the lungs. As needed  aluminum  chloride (DRYSOL) 20 % external solution Apply topically at bedtime. On chest 120 mL 3   B-D 3CC LUER-LOK SYR 23GX1-1/2 23G X 1-1/2" 3 ML MISC As dirrected IM 50 each 3   cetirizine  (ZYRTEC  ALLERGY) 10 MG tablet Take 1 tablet (10 mg total) by mouth daily. (Patient taking differently: Take 10 mg by mouth daily. As needed)     Cholecalciferol (VITAMIN D3) 50 MCG (2000 UT) capsule Take 2 capsules (4,000 Units total) by mouth daily. 100 capsule 3   mometasone  (ELOCON ) 0.1 % ointment Apply topically daily. 45 g 0   zolpidem  (AMBIEN ) 10 MG tablet Take 1 tablet (10 mg total) by mouth at bedtime as needed for sleep. (Patient not taking: Reported on 06/03/2023) 30 tablet 1   Current Facility-Administered Medications   Medication Dose Route Frequency Provider Last Rate Last Admin   0.9 %  sodium chloride  infusion  500 mL Intravenous Once Mansouraty, Loreal Schuessler Jr., MD        Current Outpatient Medications:    B Complex-C-Folic Acid  (B COMPLEX-VITAMIN C-FOLIC ACID ) 1 MG tablet, Take 1 tablet by mouth daily with breakfast., Disp: 100 tablet, Rfl: 3   cefadroxil  (DURICEF) 500 MG capsule, Take 1 capsule (500 mg total) by mouth 2 (two) times daily. (Patient taking differently: Take 500 mg by mouth daily.), Disp: 28 capsule, Rfl: 1   folic acid  (FOLVITE ) 1 MG tablet, Take 1 mg by mouth., Disp: , Rfl:    Melatonin 10-10 MG TBCR, Take by mouth. Bedtime, Disp: , Rfl:    methotrexate (RHEUMATREX) 2.5 MG tablet, Take 15 mg by mouth once a week., Disp: , Rfl:    Multiple Vitamin (MULTI-VITAMIN) tablet, Take 1 tablet by mouth daily., Disp: , Rfl:    tamsulosin  (FLOMAX ) 0.4 MG CAPS capsule, Take by mouth., Disp: , Rfl:    testosterone  cypionate (DEPOTESTOSTERONE CYPIONATE) 200 MG/ML injection, Inject 0.5 mLs (100 mg total) into the muscle every 7 (seven) days., Disp: 10 mL, Rfl: 0   albuterol  (VENTOLIN  HFA) 108 (90 Base) MCG/ACT inhaler, Inhale 2 puffs into the lungs. As needed, Disp: , Rfl:    aluminum  chloride (DRYSOL) 20 % external solution, Apply topically at bedtime. On chest, Disp: 120 mL, Rfl: 3   B-D 3CC LUER-LOK SYR 23GX1-1/2 23G X 1-1/2" 3 ML MISC, As dirrected IM, Disp: 50 each, Rfl: 3   cetirizine  (ZYRTEC  ALLERGY) 10 MG tablet, Take 1 tablet (10 mg total) by mouth daily. (Patient taking differently: Take 10 mg by mouth daily. As needed), Disp: , Rfl:    Cholecalciferol (VITAMIN D3) 50 MCG (2000 UT) capsule, Take 2 capsules (4,000 Units total) by mouth daily., Disp: 100 capsule, Rfl: 3   mometasone  (ELOCON ) 0.1 % ointment, Apply topically daily., Disp: 45 g, Rfl: 0   zolpidem  (AMBIEN ) 10 MG tablet, Take 1 tablet (10 mg total) by mouth at bedtime as needed for sleep. (Patient not taking: Reported on 06/03/2023), Disp:  30 tablet, Rfl: 1  Current Facility-Administered Medications:    0.9 %  sodium chloride  infusion, 500 mL, Intravenous, Once, Mansouraty, Albino Alu., MD Allergies  Allergen Reactions   Amoxicillin  Hives   Vancomycin  Rash    Severe redness all over body with swollen ankles and hands   Lactose Intolerance (Gi) Other (See Comments)    Red on skin   Oxycodone  Rash   Penicillins Itching and Rash   Tramadol  Itching    2019 Shoulder post-op   Family History  Problem Relation Age of Onset   Alzheimer's  disease Mother    Arthritis Father    Diabetes Brother    Hyperlipidemia Other    Colon cancer Neg Hx    Colon polyps Neg Hx    Esophageal cancer Neg Hx    Stomach cancer Neg Hx    Rectal cancer Neg Hx    Social History   Socioeconomic History   Marital status: Married    Spouse name: Not on file   Number of children: Not on file   Years of education: 16   Highest education level: Bachelor's degree (e.g., BA, AB, BS)  Occupational History   Occupation: Production designer, theatre/television/film    Comment: Marketing executive   Occupation: MANAGER    Employer: [employer]  Tobacco Use   Smoking status: Former    Types: Cigars    Quit date: 1990    Years since quitting: 35.4   Smokeless tobacco: Never  Vaping Use   Vaping status: Never Used  Substance and Sexual Activity   Alcohol use: Yes    Comment: occasionally   Drug use: No   Sexual activity: Not on file  Other Topics Concern   Not on file  Social History Narrative   ** Merged History Encounter **       Caffeine 4 cups avg daily, 4 cans soda.   Social Drivers of Corporate investment banker Strain: Low Risk  (05/03/2023)   Overall Financial Resource Strain (CARDIA)    Difficulty of Paying Living Expenses: Not hard at all  Food Insecurity: No Food Insecurity (05/03/2023)   Hunger Vital Sign    Worried About Running Out of Food in the Last Year: Never true    Ran Out of Food in the Last Year: Never true  Transportation Needs: No  Transportation Needs (05/03/2023)   PRAPARE - Administrator, Civil Service (Medical): No    Lack of Transportation (Non-Medical): No  Physical Activity: Sufficiently Active (05/03/2023)   Exercise Vital Sign    Days of Exercise per Week: 5 days    Minutes of Exercise per Session: 30 min  Stress: No Stress Concern Present (05/03/2023)   [employer] of Occupational Health - Occupational Stress Questionnaire    Feeling of Stress : Not at all  Social Connections: Socially Integrated (05/03/2023)   Social Connection and Isolation Panel [NHANES]    Frequency of Communication with Friends and Family: More than three times a week    Frequency of Social Gatherings with Friends and Family: Once a week    Attends Religious Services: More than 4 times per year    Active Member of [employer] or Organizations: Yes    Attends Banker Meetings: 1 to 4 times per year    Marital Status: Married  Catering manager Violence: Not At Risk (05/14/2022)   Humiliation, Afraid, Rape, and Kick questionnaire    Fear of Current or Ex-Partner: No    Emotionally Abused: No    Physically Abused: No    Sexually Abused: No    Physical Exam: Today's Vitals   06/03/23 0909  BP: (!) 140/78  Pulse: 85  Temp: 98 F (36.7 C)  TempSrc: Temporal  SpO2: 94%  Weight: 245 lb (111.1 kg)  Height: 6\' 1"  (1.854 m)   Body mass index is 32.32 kg/m. GEN: NAD EYE: Sclerae anicteric ENT: MMM CV: Non-tachycardic GI: Soft, NT/ND NEURO:  Alert & Oriented x 3  Lab Results: No results for input(s): "WBC", "HGB", "HCT", "PLT" in the last 72 hours.  BMET No results for input(s): "NA", "K", "CL", "CO2", "GLUCOSE", "BUN", "CREATININE", "CALCIUM " in the last 72 hours. LFT No results for input(s): "PROT", "ALBUMIN", "AST", "ALT", "ALKPHOS", "BILITOT", "BILIDIR", "IBILI" in the last 72 hours. PT/INR No results for input(s): "LABPROT", "INR" in the last 72 hours.   Impression / Plan: This is a 73 y.o.male  who presents for EGD for abnormal PETCT of the esophagus.  The risks and benefits of endoscopic evaluation/treatment were discussed with the patient and/or family; these include but are not limited to the risk of perforation, infection, bleeding, missed lesions, lack of diagnosis, severe illness requiring hospitalization, as well as anesthesia and sedation related illnesses.  The patient's history has been reviewed, patient examined, no change in status, and deemed stable for procedure.  The patient and/or family is agreeable to proceed.    Yong Henle, MD [facility]oscopy Office # [phone]

## 2023-06-03 NOTE — Op Note (Signed)
 [facility] Patient Name: Chris Obrien Procedure Date: 06/03/2023 9:55 AM MRN: [id number] Endoscopist: Yong Henle , MD, [id number] Age: 73 Referring MD:  Date of Birth: April 20, 1950 Gender: Male Account #: [id number] Procedure:                Upper GI endoscopy Indications:              Abnormal PET scan of the GI tract (esophagus) Medicines:                Monitored Anesthesia Care Procedure:                Pre-Anesthesia Assessment:                           - Prior to the procedure, a History and Physical                            was performed, and patient medications and                            allergies were reviewed. The patient's tolerance of                            previous anesthesia was also reviewed. The risks                            and benefits of the procedure and the sedation                            options and risks were discussed with the patient.                            All questions were answered, and informed consent                            was obtained. Prior Anticoagulants: The patient has                            taken no anticoagulant or antiplatelet agents. ASA                            Grade Assessment: II - A patient with mild systemic                            disease. After reviewing the risks and benefits,                            the patient was deemed in satisfactory condition to                            undergo the procedure.                           After obtaining informed consent, the endoscope was  passed under direct vision. Throughout the                            procedure, the patient's blood pressure, pulse, and                            oxygen saturations were monitored continuously. The                            GIF [id number] #[id number] was introduced through the                            mouth, and advanced to the second part of duodenum.                            The  upper GI endoscopy was accomplished without                            difficulty. The patient tolerated the procedure. Scope In: Scope Out: Findings:                 The examined esophagus was moderately tortuous.                           No gross mucosal lesions were noted in the proximal                            esophagus and in the mid esophagus.                           LA Grade A (one or more mucosal breaks less than 5                            mm, not extending between tops of 2 mucosal folds)                            esophagitis with no bleeding was found in the                            distal esophagus. Biopsies were taken with a cold                            forceps for histology.                           The Z-line was irregular and was found 43 cm from                            the incisors.                           A 3 cm hiatal hernia was present.  Localized moderate inflammation characterized by                            erosions, erythema and friability was found in the                            gastric antrum.                           No other gross lesions were noted in the entire                            examined stomach. Biopsies were taken with a cold                            forceps for histology and Helicobacter pylori                            testing.                           A single 5 mm sessile polyp was found in the second                            portion of the duodenum. The polyp was removed with                            a cold snare. Resection and retrieval were complete.                           Patchy mild inflammation characterized by erythema                            and granularity was found in the duodenal bulb, in                            the first portion of the duodenum and in the second                            portion of the duodenum. Biopsies were taken with a                             cold forceps for histology. Complications:            No immediate complications. Estimated Blood Loss:     Estimated blood loss was minimal. Impression:               - Tortuous esophagus.                           - No gross mucosal lesions in the proximal                            esophagus and in the mid esophagus.                           -  LA Grade A esophagitis with no bleeding found                            distally. Biopsied.                           - Z-line irregular, 43 cm from the incisors.                           - 3 cm hiatal hernia.                           - Antral gastritis. No other gross lesions in the                            entire stomach. Biopsied.                           - A single duodenal polyp in D2. Resected and                            retrieved.                           - Duodenitis. Biopsied. Recommendation:           - The patient will be observed post-procedure,                            until all discharge criteria are met.                           - Discharge patient to home.                           - Patient has a contact number available for                            emergencies. The signs and symptoms of potential                            delayed complications were discussed with the                            patient. Return to normal activities tomorrow.                            Written discharge instructions were provided to the                            patient.                           - Resume previous diet.                           - Recommend omeprazole 40 mg twice daily  for 1                            month. May decrease to once daily thereafter.                           - Observe patient's clinical course.                           - Await pathology results.                           - With Grade A esophagitis, not clear that we                            necessarily need to repeat upper endoscopy, though                             we could, pending overall clinical status.                           - If patient has evidence of dysplasia on sampling,                            will require 1 year surveillance.                           - The findings and recommendations were discussed                            with the patient.                           - The findings and recommendations were discussed                            with the patient's family. Yong Henle, MD 06/03/2023 10:22:42 AM

## 2023-06-03 NOTE — Patient Instructions (Addendum)
 Resume previous diet and medications.  Biopsy results will be sent via MyChart or letter.  Ordered Omeprazole 40mg  twice daily for 1 month, may decrease to once daily thereafter.    YOU HAD AN ENDOSCOPIC PROCEDURE TODAY AT THE [facility]:   Refer to the procedure report that was given to you for any specific questions about what was found during the examination.  If the procedure report does not answer your questions, please call your gastroenterologist to clarify.  If you requested that your care partner not be given the details of your procedure findings, then the procedure report has been included in a sealed envelope for you to review at your convenience later.  YOU SHOULD EXPECT: Some feelings of bloating in the abdomen. Passage of more gas than usual.  Walking can help get rid of the air that was put into your GI tract during the procedure and reduce the bloating. If you had a lower endoscopy (such as a colonoscopy or flexible sigmoidoscopy) you may notice spotting of blood in your stool or on the toilet paper. If you underwent a bowel prep for your procedure, you may not have a normal bowel movement for a few days.  Please Note:  You might notice some irritation and congestion in your nose or some drainage.  This is from the oxygen used during your procedure.  There is no need for concern and it should clear up in a day or so.  SYMPTOMS TO REPORT IMMEDIATELY:  Following upper endoscopy (EGD)  Vomiting of blood or coffee ground material  New chest pain or pain under the shoulder blades  Painful or persistently difficult swallowing  New shortness of breath  Fever of 100F or higher  Black, tarry-looking stools  For urgent or emergent issues, a gastroenterologist can be reached at any hour by calling [phone]. Do not use MyChart messaging for urgent concerns.    DIET:  We do recommend a small meal at first, but then you may proceed to your regular diet.  Drink plenty of  fluids but you should avoid alcoholic beverages for 24 hours.  ACTIVITY:  You should plan to take it easy for the rest of today and you should NOT DRIVE or use heavy machinery until tomorrow (because of the sedation medicines used during the test).    FOLLOW UP: Our staff will call the number listed on your records the next business day following your procedure.  We will call around 7:15- 8:00 am to check on you and address any questions or concerns that you may have regarding the information given to you following your procedure. If we do not reach you, we will leave a message.     If any biopsies were taken you will be contacted by phone or by letter within the next 1-3 weeks.  Please call us  at [phone] if you have not heard about the biopsies in 3 weeks.    SIGNATURES/CONFIDENTIALITY: You and/or your care partner have signed paperwork which will be entered into your electronic medical record.  These signatures attest to the fact that that the information above on your After Visit Summary has been reviewed and is understood.  Full responsibility of the confidentiality of this discharge information lies with you and/or your care-partner.

## 2023-06-03 NOTE — Progress Notes (Signed)
 Pt A/O x 3, gd SR's, pleased with anesthesia, report to RN

## 2023-06-03 NOTE — Progress Notes (Signed)
 Pt's states no medical or surgical changes since previsit or office visit.

## 2023-06-04 ENCOUNTER — Telehealth: Payer: Self-pay

## 2023-06-04 NOTE — Telephone Encounter (Signed)
  Follow up Call-     06/03/2023    9:10 AM  Call back number  Post procedure Call Back phone  # [phone]  Permission to leave phone message Yes     Patient questions:  Do you have a fever, pain , or abdominal swelling? No. Pain Score  0 *  Have you tolerated food without any problems? Yes.    Have you been able to return to your normal activities? Yes.    Do you have any questions about your discharge instructions: Diet   No. Medications  No. Follow up visit  No.  Do you have questions or concerns about your Care? No.  Actions: * If pain score is 4 or above: No action needed, pain <4.

## 2023-06-05 ENCOUNTER — Encounter: Admitting: Internal Medicine

## 2023-06-06 LAB — SURGICAL PATHOLOGY

## 2023-06-08 ENCOUNTER — Other Ambulatory Visit: Payer: Self-pay | Admitting: Internal Medicine

## 2023-06-11 ENCOUNTER — Ambulatory Visit: Payer: Self-pay | Admitting: Gastroenterology

## 2023-07-15 ENCOUNTER — Telehealth: Payer: Self-pay | Admitting: Internal Medicine

## 2023-07-15 NOTE — Telephone Encounter (Signed)
 Patient saw Dr. Clay Lex Real, MD on 05/28/2023.  She would like Dr. Harley to order a CBC with differential and a comprehensive metabolic panel - CMP.  Please call patient and let him know when these orders on in the lab.

## 2023-07-16 ENCOUNTER — Encounter: Payer: Self-pay | Admitting: Gastroenterology

## 2023-07-17 ENCOUNTER — Telehealth: Payer: Self-pay

## 2023-07-17 ENCOUNTER — Encounter: Payer: Self-pay | Admitting: Internal Medicine

## 2023-07-17 DIAGNOSIS — D485 Neoplasm of uncertain behavior of skin: Secondary | ICD-10-CM

## 2023-07-17 NOTE — Telephone Encounter (Signed)
 Copied from CRM [id number]. Topic: Clinical - Request for Lab/Test Order >> Jul 17, 2023  9:04 AM Deaijah H wrote: Reason for CRM: Patient needs bloodwork because [facility] ordered them , had a chest rash and some prescriptions ordered and wanted to follow up on his bloodwork while taking those prescriptions and stated he will be leaving town next week and would like to have completed before then. Also will send over orders to Dr. Garald

## 2023-07-17 NOTE — Telephone Encounter (Signed)
 Copied from CRM [id number]. Topic: Clinical - Request for Lab/Test Order >> Jul 17, 2023  2:33 PM Melissa C wrote: Reason for CRM: Patient is calling again regarding his request for bloodwork/testing for that was ordered by [facility]. I don't see anything currently added to his lab order section and patient stated that he sent over information to Dr. Garald in Mychart message which I do see that was sent. He stated he has called about this a few times and is going out of town next week so is in great need of getting this done ASAP.

## 2023-07-18 ENCOUNTER — Ambulatory Visit: Payer: Self-pay | Admitting: Internal Medicine

## 2023-07-18 ENCOUNTER — Other Ambulatory Visit (INDEPENDENT_AMBULATORY_CARE_PROVIDER_SITE_OTHER)

## 2023-07-18 DIAGNOSIS — D485 Neoplasm of uncertain behavior of skin: Secondary | ICD-10-CM

## 2023-07-18 LAB — COMPREHENSIVE METABOLIC PANEL WITH GFR
ALT: 26 U/L (ref 0–53)
AST: 23 U/L (ref 0–37)
Albumin: 3.6 g/dL (ref 3.5–5.2)
Alkaline Phosphatase: 70 U/L (ref 39–117)
BUN: 23 mg/dL (ref 6–23)
CO2: 31 meq/L (ref 19–32)
Calcium: 8.7 mg/dL (ref 8.4–10.5)
Chloride: 103 meq/L (ref 96–112)
Creatinine, Ser: 1.36 mg/dL (ref 0.40–1.50)
GFR: 51.06 mL/min — ABNORMAL LOW (ref >60.00)
Glucose, Bld: 84 mg/dL (ref 70–99)
Potassium: 4.5 meq/L (ref 3.5–5.1)
Sodium: 139 meq/L (ref 135–145)
Total Bilirubin: 0.4 mg/dL (ref 0.2–1.2)
Total Protein: 7 g/dL (ref 6.0–8.3)

## 2023-07-18 LAB — CBC WITH DIFFERENTIAL/PLATELET
Basophils Absolute: 0 K/uL (ref 0.0–0.1)
Basophils Relative: 0.6 % (ref 0.0–3.0)
Eosinophils Absolute: 0.2 K/uL (ref 0.0–0.7)
Eosinophils Relative: 4 % (ref 0.0–5.0)
HCT: 38 % — ABNORMAL LOW (ref 39.0–52.0)
Hemoglobin: 12.2 g/dL — ABNORMAL LOW (ref 13.0–17.0)
Lymphocytes Relative: 21.8 % (ref 12.0–46.0)
Lymphs Abs: 1.3 K/uL (ref 0.7–4.0)
MCHC: 32.1 g/dL (ref 30.0–36.0)
MCV: 76.5 fl — ABNORMAL LOW (ref 78.0–100.0)
Monocytes Absolute: 0.5 K/uL (ref 0.1–1.0)
Monocytes Relative: 8.5 % (ref 3.0–12.0)
Neutro Abs: 3.9 K/uL (ref 1.4–7.7)
Neutrophils Relative %: 65.1 % (ref 43.0–77.0)
Platelets: 484 K/uL — ABNORMAL HIGH (ref 150.0–400.0)
RBC: 4.97 Mil/uL (ref 4.22–5.81)
RDW: 19 % — ABNORMAL HIGH (ref 11.5–15.5)
WBC: 5.9 K/uL (ref 4.0–10.5)

## 2023-07-18 NOTE — Telephone Encounter (Signed)
Labs have been ordered and pt is aware.

## 2023-07-18 NOTE — Telephone Encounter (Signed)
 I was able to speak with the pt and order the labs needed.

## 2023-07-25 ENCOUNTER — Encounter: Payer: Self-pay | Admitting: Advanced Practice Midwife

## 2023-07-28 ENCOUNTER — Ambulatory Visit: Payer: Self-pay

## 2023-07-28 ENCOUNTER — Ambulatory Visit (INDEPENDENT_AMBULATORY_CARE_PROVIDER_SITE_OTHER)

## 2023-07-28 ENCOUNTER — Ambulatory Visit (INDEPENDENT_AMBULATORY_CARE_PROVIDER_SITE_OTHER): Admitting: Family Medicine

## 2023-07-28 ENCOUNTER — Encounter: Payer: Self-pay | Admitting: Family Medicine

## 2023-07-28 ENCOUNTER — Ambulatory Visit: Payer: Self-pay | Admitting: Family Medicine

## 2023-07-28 VITALS — BP 162/88 | HR 79 | Temp 98.4°F | Resp 18 | Ht 73.0 in | Wt 236.0 lb

## 2023-07-28 DIAGNOSIS — M79601 Pain in right arm: Secondary | ICD-10-CM

## 2023-07-28 DIAGNOSIS — G8929 Other chronic pain: Secondary | ICD-10-CM

## 2023-07-28 DIAGNOSIS — M25511 Pain in right shoulder: Secondary | ICD-10-CM | POA: Diagnosis not present

## 2023-07-28 DIAGNOSIS — S42495A Other nondisplaced fracture of lower end of left humerus, initial encounter for closed fracture: Secondary | ICD-10-CM

## 2023-07-28 DIAGNOSIS — R030 Elevated blood-pressure reading, without diagnosis of hypertension: Secondary | ICD-10-CM | POA: Diagnosis not present

## 2023-07-28 NOTE — Telephone Encounter (Signed)
 FYI Only or Action Required?: Action required by provider: request for appointment.  Patient was last seen in primary care on 05/07/2023 by Plotnikov, Karlynn GAILS, MD.  Called Nurse Triage reporting Shoulder Pain.  Symptoms began several days ago.  Interventions attempted: Nothing.  Symptoms are: gradually worsening. Right shoulder and bicep pain, weakness. History of surgery repairs.  Triage Disposition: See HCP Within 4 Hours (Or PCP Triage)  Patient/caregiver understands and will follow disposition?: Yes    Copied from CRM [id number]. 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 ONSET: When did the pain start?     Saturday 2. LOCATION: Where is the pain located?     RIGHT 3. PAIN: How bad is the pain? (Scale 1-10; or mild, moderate, severe)     Severe 4. WORK OR EXERCISE: Has there been any recent work or exercise that involved this part of the body?     no 5. CAUSE: What do you think is causing the shoulder pain?     unsure 6. OTHER SYMPTOMS: Do you have any other symptoms? (e.g., neck pain, swelling, rash, fever, numbness, weakness)     weakness 7. PREGNANCY: Is there any chance you are pregnant? When was your last menstrual period?     N/a  Protocols used: Shoulder Pain-A-AH  Reason for Disposition  Weakness (i.e., loss of strength) in hand or fingers  (Exception: Not truly weak; hand feels weak because of pain.)  Answer Assessment - Initial Assessment Questions 1. ONSET: When did the pain start?     Saturday 2. LOCATION: Where is the pain located?     RIGHT 3. PAIN: How bad is the pain? (Scale 1-10; or mild, moderate, severe)      Severe 4. WORK OR EXERCISE: Has there been any recent work or exercise that involved this part of the body?     no 5. CAUSE: What do you think is causing the shoulder pain?     unsure 6. OTHER SYMPTOMS: Do you have any other symptoms? (e.g., neck pain, swelling, rash, fever, numbness, weakness)     weakness 7. PREGNANCY: Is there any chance you are pregnant? When was your last menstrual period?     N/a  Protocols used: Shoulder Pain-A-AH

## 2023-07-28 NOTE — Patient Instructions (Signed)
BP Goal <150/90

## 2023-07-28 NOTE — Progress Notes (Signed)
 Assessment & Plan:  1. Chronic right shoulder pain (Primary) - DG Shoulder Right; Future  2. Right arm pain - DG Humerus Right; Future  3. Elevated BP without diagnosis of hypertension  Encouraged to monitor BP at home and return if BP consistently > 150/90.    Follow up plan: Return if symptoms worsen or fail to improve.  Niki Rung, MSN, APRN, FNP-C  Subjective:  HPI: Chris Obrien is a 73 y.o. male presenting on 07/28/2023 for Shoulder Pain (Right - chronic pain = hx of surgeries, feels pain, hx of no strength there, does have some numbness and hand feel cold. /Dr Chriss in [facility] is specialist he sees )  Patient reports right shoulder pain that started several days ago and is gradually worsening. 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 His last shoulder imaging on 05/14/2023 showed concerns of a persistent unhealed area. Due to the increase in pain, he is concerned something more is going on with his cadaver bone. The pain is sharp in nature which is changed from the chronic ache he experiences. He is unable to do his normal workouts at the gym due to the pain.    ROS: Negative unless specifically indicated above in HPI.   Relevant past medical history reviewed and updated as indicated.   Allergies and medications reviewed and updated.   Current Outpatient Medications:    B Complex-C-Folic Acid  (B COMPLEX-VITAMIN C-FOLIC ACID ) 1 MG tablet, Take 1 tablet by mouth daily with breakfast., Disp: 100 tablet, Rfl: 3   B-D 3CC LUER-LOK SYR 23GX1-1/2 23G X 1-1/2 3 ML MISC, As dirrected IM, Disp: 50 each, Rfl: 3   Cholecalciferol (VITAMIN D3) 50 MCG (2000 UT) capsule, Take 2 capsules (4,000 Units total) by mouth daily., Disp: 100 capsule, Rfl: 3   folic acid  (FOLVITE ) 1 MG tablet, Take 1 mg by mouth., Disp: , Rfl:    glycopyrrolate  (ROBINUL ) 2 MG tablet, Take 2 mg by mouth every 8 (eight) hours., Disp: , Rfl:     Melatonin 10-10 MG TBCR, Take by mouth. Bedtime, Disp: , Rfl:    methotrexate (RHEUMATREX) 2.5 MG tablet, Take 15 mg by mouth once a week., Disp: , Rfl:    mometasone  (ELOCON ) 0.1 % ointment, Apply topically daily., Disp: 45 g, Rfl: 0   Multiple Vitamin (MULTI-VITAMIN) tablet, Take 1 tablet by mouth daily., Disp: , Rfl:    omeprazole  (PRILOSEC) 40 MG capsule, Take 1 capsule (40 mg total) by mouth 2 (two) times daily before a meal for 30 days, THEN 1 capsule (40 mg total) daily., Disp: 90 capsule, Rfl: 6   tamsulosin  (FLOMAX ) 0.4 MG CAPS capsule, Take by mouth., Disp: , Rfl:    testosterone  cypionate (DEPOTESTOSTERONE CYPIONATE) 200 MG/ML injection, INJECT 0.5ML (100MG  TOTAL) INTO THE MUSCLE EVERY 7 DAYS - SINGLE USE VIAL - CONTENTS MUST BE DISCARDED AFTER FIRST USE, Disp: 10 mL, Rfl: 1   cefadroxil  (DURICEF) 500 MG capsule, Take 1 capsule (500 mg total) by mouth 2 (two) times daily. (Patient taking differently: Take 500 mg by mouth daily.), Disp: 28 capsule, Rfl: 1  Allergies  Allergen Reactions   Amoxicillin  Hives   Vancomycin  Rash    Severe redness all over body with swollen ankles and hands   Lactose Intolerance (Gi) Other (See Comments)    Red on skin   Oxycodone  Rash   Penicillins Itching and Rash   Tramadol  Itching    2019  Shoulder post-op    Objective:   BP (!) 156/70   Pulse 79   Temp 98.4 F (36.9 C)   Resp 18   Ht 6' 1 (1.854 m)   Wt 236 lb (107 kg)   SpO2 95%   BMI 31.14 kg/m    Physical Exam Vitals reviewed.  Constitutional:      General: He is not in acute distress.    Appearance: Normal appearance. He is not ill-appearing, toxic-appearing or diaphoretic.  HENT:     Head: Normocephalic and atraumatic.  Eyes:     General: No scleral icterus.       Right eye: No discharge.        Left eye: No discharge.     Conjunctiva/sclera: Conjunctivae normal.  Cardiovascular:     Rate and Rhythm: Normal rate.  Pulmonary:     Effort: Pulmonary effort is normal. No  respiratory distress.  Musculoskeletal:        General: Normal range of motion.     Right shoulder: Tenderness (with certain movements) present.     Cervical back: Normal range of motion.  Skin:    General: Skin is warm and dry.  Neurological:     Mental Status: He is alert and oriented to person, place, and time. Mental status is at baseline.  Psychiatric:        Mood and Affect: Mood normal.        Behavior: Behavior normal.        Thought Content: Thought content normal.        Judgment: Judgment normal.

## 2023-07-29 ENCOUNTER — Other Ambulatory Visit (HOSPITAL_BASED_OUTPATIENT_CLINIC_OR_DEPARTMENT_OTHER): Payer: Self-pay | Admitting: Internal Medicine

## 2023-07-29 ENCOUNTER — Ambulatory Visit (HOSPITAL_BASED_OUTPATIENT_CLINIC_OR_DEPARTMENT_OTHER)
Admission: RE | Admit: 2023-07-29 | Discharge: 2023-07-29 | Disposition: A | Discharge status: Home / Self Care | Source: Ambulatory Visit | Attending: Internal Medicine | Admitting: Internal Medicine

## 2023-07-29 DIAGNOSIS — M79601 Pain in right arm: Secondary | ICD-10-CM | POA: Diagnosis present

## 2023-08-15 NOTE — Addendum Note (Signed)
 Addended by: HEDDY IP R on: 08/15/2023 10:48 AM   Modules accepted: Orders

## 2023-08-18 ENCOUNTER — Other Ambulatory Visit (INDEPENDENT_AMBULATORY_CARE_PROVIDER_SITE_OTHER)

## 2023-08-18 DIAGNOSIS — D485 Neoplasm of uncertain behavior of skin: Secondary | ICD-10-CM

## 2023-08-18 LAB — COMPREHENSIVE METABOLIC PANEL WITH GFR
ALT: 18 U/L (ref 0–53)
AST: 16 U/L (ref 0–37)
Albumin: 3.5 g/dL (ref 3.5–5.2)
Alkaline Phosphatase: 67 U/L (ref 39–117)
BUN: 16 mg/dL (ref 6–23)
CO2: 31 meq/L (ref 19–32)
Calcium: 8.7 mg/dL (ref 8.4–10.5)
Chloride: 100 meq/L (ref 96–112)
Creatinine, Ser: 1.33 mg/dL (ref 0.40–1.50)
GFR: 52.41 mL/min — ABNORMAL LOW (ref >60.00)
Glucose, Bld: 115 mg/dL — ABNORMAL HIGH (ref 70–99)
Potassium: 4.4 meq/L (ref 3.5–5.1)
Sodium: 137 meq/L (ref 135–145)
Total Bilirubin: 0.5 mg/dL (ref 0.2–1.2)
Total Protein: 6.7 g/dL (ref 6.0–8.3)

## 2023-08-18 LAB — CBC WITH DIFFERENTIAL/PLATELET
Basophils Absolute: 0 K/uL (ref 0.0–0.1)
Basophils Relative: 0.4 % (ref 0.0–3.0)
Eosinophils Absolute: 0.4 K/uL (ref 0.0–0.7)
Eosinophils Relative: 5.8 % — ABNORMAL HIGH (ref 0.0–5.0)
HCT: 39.2 % (ref 39.0–52.0)
Hemoglobin: 12.4 g/dL — ABNORMAL LOW (ref 13.0–17.0)
Lymphocytes Relative: 26.2 % (ref 12.0–46.0)
Lymphs Abs: 1.6 K/uL (ref 0.7–4.0)
MCHC: 31.7 g/dL (ref 30.0–36.0)
MCV: 78.4 fl (ref 78.0–100.0)
Monocytes Absolute: 0.4 K/uL (ref 0.1–1.0)
Monocytes Relative: 6.7 % (ref 3.0–12.0)
Neutro Abs: 3.8 K/uL (ref 1.4–7.7)
Neutrophils Relative %: 60.9 % (ref 43.0–77.0)
Platelets: 449 K/uL — ABNORMAL HIGH (ref 150.0–400.0)
RBC: 5 Mil/uL (ref 4.22–5.81)
RDW: 20.5 % — ABNORMAL HIGH (ref 11.5–15.5)
WBC: 6.3 K/uL (ref 4.0–10.5)

## 2023-08-19 DIAGNOSIS — M25511 Pain in right shoulder: Secondary | ICD-10-CM | POA: Insufficient documentation

## 2023-08-21 ENCOUNTER — Other Ambulatory Visit: Payer: Self-pay | Admitting: Internal Medicine

## 2023-08-21 DIAGNOSIS — G8929 Other chronic pain: Secondary | ICD-10-CM

## 2023-08-22 ENCOUNTER — Ambulatory Visit (INDEPENDENT_AMBULATORY_CARE_PROVIDER_SITE_OTHER)

## 2023-08-22 VITALS — Ht 72.0 in | Wt 236.0 lb

## 2023-08-22 DIAGNOSIS — Z0000 Encounter for general adult medical examination without abnormal findings: Secondary | ICD-10-CM

## 2023-08-22 NOTE — Patient Instructions (Addendum)
 Mr. Chris Obrien , Thank you for taking time out of your busy schedule to complete your Annual Wellness Visit with me. I enjoyed our conversation and look forward to speaking with you again next year. I, as well as your care team,  appreciate your ongoing commitment to your health goals. Please review the following plan we discussed and let me know if I can assist you in the future. Your Game plan/ To Do List    Referrals: If you haven't heard from the office you've been referred to, please reach out to them at the phone provided.   Follow up Visits: We will see or speak with you next year for your Next Medicare AWV with our clinical staff Have you seen your provider in the last 6 months (3 months if uncontrolled diabetes)? No  Clinician Recommendations:  Aim for 30 minutes of exercise or brisk walking, 6-8 glasses of water , and 5 servings of fruits and vegetables each day.       This is a list of the screenings recommended for you:  Health Maintenance  Topic Date Due   Colon Cancer Screening  04/20/2019   COVID-19 Vaccine (5 - 2024-25 season) 09/08/2022   Flu Shot  08/08/2023   Medicare Annual Wellness Visit  08/21/2024   DTaP/Tdap/Td vaccine (3 - Tdap) 10/07/2029   Pneumococcal Vaccine for age over 82  Completed   Hepatitis C Screening  Completed   Zoster (Shingles) Vaccine  Completed   HPV Vaccine  Aged Out   Meningitis B Vaccine  Aged Out   Pneumococcal Vaccine  Discontinued    Advanced directives: (Copy Requested) Please bring a copy of your health care power of attorney and living will to the office to be added to your chart at your convenience. You can mail to [facility] [address] [place], [place] [address] or email to [email or web address] Advance Care Planning is important because it:  [x]  Makes sure you receive the medical care that is consistent with your values, goals, and preferences  [x]  It provides guidance to your family and loved ones and  reduces their decisional burden about whether or not they are making the right decisions based on your wishes.  Follow the link provided in your after visit summary or read over the paperwork we have mailed to you to help you started getting your Advance Directives in place. If you need assistance in completing these, please reach out to us  so that we can help you!

## 2023-08-22 NOTE — Progress Notes (Signed)
 Subjective:  Please attest and cosign this visit due to patients primary care provider not being in the office at the time the visit was completed.  (Pt of Dr A. Plotnikov)   Chris Obrien is a 73 y.o. who presents for a Medicare Wellness preventive visit.  As a reminder, Annual Wellness Visits don't include a physical exam, and some assessments may be limited, especially if this visit is performed virtually. We may recommend an in-person follow-up visit with your provider if needed.  Visit Complete: Virtual I connected with  Chris Obrien on 08/22/23 by a audio enabled telemedicine application and verified that I am speaking with the correct person using two identifiers.  Patient Location: Home  Provider Location: Office/Clinic  I discussed the limitations of evaluation and management by telemedicine. The patient expressed understanding and agreed to proceed.  Vital Signs: Because this visit was a virtual/telehealth visit, some criteria may be missing or patient reported. Any vitals not documented were not able to be obtained and vitals that have been documented are patient reported.  VideoDeclined- This patient declined Librarian, academic. Therefore the visit was completed with audio only.  Persons Participating in Visit: Patient.  AWV Questionnaire: Yes: Patient Medicare AWV questionnaire was completed by the patient on 08/18/2023; I have confirmed that all information answered by patient is correct and no changes since this date.  Cardiac Risk Factors include: advanced age (>36men, >29 women);male gender     Objective:    Today's Vitals   08/22/23 1409  Weight: 236 lb (107 kg)  Height: 6' (1.829 m)   Body mass index is 32.01 kg/m.     08/22/2023    2:09 PM 05/14/2022    2:16 PM 05/07/2021    1:18 PM 07/17/2015   10:50 AM 07/05/2015    8:10 AM 06/15/2012   11:15 AM 06/03/2012    8:38 AM  Advanced Directives  Does Patient Have a Medical Advance  Directive? Yes Yes No Yes  Yes  Patient has advance directive, copy not in chart  Patient has advance directive, copy not in chart   Type of Advance Directive Healthcare Power of [place];Living will Healthcare Power of [place];Living will  Healthcare Power of [place];Living will  Healthcare Power of [place];Living will  Healthcare Power of [place];Living will  Healthcare Power of [place];Living will   Does patient want to make changes to medical advance directive?    No - Patient declined  No - Patient declined     Copy of Healthcare Power of Attorney in Chart? No - copy requested No - copy requested  Yes  Yes  Copy requested from family  --   Would patient like information on creating a medical advance directive?   No - Patient declined      Pre-existing out of facility DNR order (yellow form or pink MOST form)      No       Data saved with a previous flowsheet row definition    Current Medications (verified) Outpatient Encounter Medications as of 08/22/2023  Medication Sig   B Complex-C-Folic Acid  (B COMPLEX-VITAMIN C-FOLIC ACID ) 1 MG tablet Take 1 tablet by mouth daily with breakfast.   B-D 3CC LUER-LOK SYR 23GX1-1/2 23G X 1-1/2 3 ML MISC As dirrected IM   cefadroxil  (DURICEF) 500 MG capsule TAKE ONE CAPUSLE BY MOUTH TWO TIMES DAILY.   Cholecalciferol (VITAMIN D3) 50 MCG (2000 UT) capsule Take 2 capsules (4,000 Units total) by mouth daily.  folic acid  (FOLVITE ) 1 MG tablet Take 1 mg by mouth.   glycopyrrolate  (ROBINUL ) 2 MG tablet Take 2 mg by mouth every 8 (eight) hours.   Melatonin 10-10 MG TBCR Take by mouth. Bedtime   Multiple Vitamin (MULTI-VITAMIN) tablet Take 1 tablet by mouth daily.   omeprazole  (PRILOSEC) 40 MG capsule Take 1 capsule (40 mg total) by mouth 2 (two) times daily before a meal for 30 days, THEN 1 capsule (40 mg total) daily.   tamsulosin  (FLOMAX ) 0.4 MG CAPS capsule Take by mouth.   testosterone  cypionate (DEPOTESTOSTERONE CYPIONATE) 200 MG/ML injection INJECT  0.5ML (100MG  TOTAL) INTO THE MUSCLE EVERY 7 DAYS - SINGLE USE VIAL - CONTENTS MUST BE DISCARDED AFTER FIRST USE   methotrexate (RHEUMATREX) 2.5 MG tablet Take 15 mg by mouth once a week. (Patient not taking: Reported on 08/22/2023)   [DISCONTINUED] mometasone  (ELOCON ) 0.1 % ointment Apply topically daily.   No facility-administered encounter medications on file as of 08/22/2023.    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   Dermatitis 09/02/2022   GERD (gastroesophageal reflux disease)    rare    Insomnia, unspecified    Oligospermia    OSA on CPAP    PT LOST  WEIGHT - NO LONGER USING CPAP   Pain    RIGHT SHOULDER DELTOID MUSCLE ATROPHY - LOSS OF STRENGT VERY LIMITED RANGE OF MOTTION   Right knee skin infection    Sleep apnea    weight loss so no cpap now    Swelling of joint of right knee    Urticaria    Past Surgical History:  Procedure Laterality Date   ADENOIDECTOMY     COLONOSCOPY     EXCISIONAL TOTAL KNEE ARTHROPLASTY WITH ANTIBIOTIC SPACERS Right 04/27/2012   Procedure: EXCISIONAL TOTAL KNEE ARTHROPLASTY WITH ANTIBIOTIC SPACERS;  Surgeon: Donnice JONETTA Car, MD;  Location: [facility];  Service: Orthopedics;  Laterality: Right;  SAME DAY LABS   FRACTURE SURGERY  12/2020   IRRIGATION AND DEBRIDEMENT KNEE Right 04/02/2012   Procedure: IRRIGATION AND DEBRIDEMENT RIGHT PRE-PATELLA  ;  Surgeon: Donnice JONETTA Car, MD;  Location: [facility];  Service: Orthopedics;  Laterality: Right;   JOINT REPLACEMENT  [id number]   KNEE ARTHROSCOPY Right 08/2010    POLYPECTOMY     SINOSCOPY     TONSILLECTOMY     TOTAL KNEE ARTHROPLASTY  01/28/2012   Procedure: TOTAL KNEE ARTHROPLASTY;  Surgeon: Donnice JONETTA Car, MD;  Location: [facility];  Service: Orthopedics;  Laterality: Right;   TOTAL KNEE ARTHROPLASTY Left 07/17/2015   Procedure: LEFT TOTAL KNEE ARTHROPLASTY;  Surgeon: Donnice Car, MD;  Location: [facility];  Service: Orthopedics;  Laterality: Left;   TOTAL KNEE REVISION Right 06/15/2012   Procedure: reimplantation of right total knee ;  Surgeon: Donnice JONETTA Car, MD;  Location: [facility];  Service: Orthopedics;  Laterality: Right;   TOTAL SHOULDER REPLACEMENT Bilateral    done in [facility]    ULNAR NERVE REPAIR Right 2008   Family History  Problem Relation Age of Onset   Alzheimer's disease Mother  Arthritis Father    Diabetes Brother    Hyperlipidemia Other    Colon cancer Neg Hx    Colon polyps Neg Hx    Esophageal cancer Neg Hx    Stomach cancer Neg Hx    Rectal cancer Neg Hx    Social History   Socioeconomic History   Marital status: Married    Spouse name: Not on file   Number of children: Not on file   Years of education: 16   Highest education level: Bachelor's degree (e.g., BA, AB, BS)  Occupational History   Occupation: Production designer, theatre/television/film    Comment: Marketing executive   Occupation: MANAGER    Employer: [employer]  Tobacco Use   Smoking status: Former    Types: Cigars    Quit date: 1990    Years since quitting: 35.6   Smokeless tobacco: Never  Vaping Use   Vaping status: Never Used  Substance and Sexual Activity   Alcohol use: Not Currently    Comment: occasionally   Drug use: No   Sexual activity: Not on file  Other Topics Concern   Not on file  Social History Narrative   ** Merged History Encounter **       Caffeine 4 cups avg daily, 4 cans soda.   Social Drivers of Corporate investment banker Strain: Low Risk  (08/22/2023)   Overall Financial Resource Strain (CARDIA)    Difficulty of Paying Living Expenses: Not hard at  all  Food Insecurity: No Food Insecurity (08/22/2023)   Hunger Vital Sign    Worried About Running Out of Food in the Last Year: Never true    Ran Out of Food in the Last Year: Never true  Transportation Needs: No Transportation Needs (08/22/2023)   PRAPARE - Administrator, Civil Service (Medical): No    Lack of Transportation (Non-Medical): No  Physical Activity: Sufficiently Active (08/22/2023)   Exercise Vital Sign    Days of Exercise per Week: 4 days    Minutes of Exercise per Session: 40 min  Stress: No Stress Concern Present (08/22/2023)   [employer] of Occupational Health - Occupational Stress Questionnaire    Feeling of Stress: Not at all  Social Connections: Moderately Integrated (08/22/2023)   Social Connection and Isolation Panel    Frequency of Communication with Friends and Family: More than three times a week    Frequency of Social Gatherings with Friends and Family: Three times a week    Attends Religious Services: Never    Active Member of Clubs or Organizations: Yes    Attends Banker Meetings: Never    Marital Status: Married    Tobacco Counseling Counseling given: Not Answered    Clinical Intake:  Pre-visit preparation completed: Yes  Pain : No/denies pain     BMI - recorded: 32.01 Nutritional Status: BMI > 30  Obese Nutritional Risks: None Diabetes: No  Lab Results  Component Value Date   HGBA1C 5.4 05/23/2020   HGBA1C 5.4 12/21/2013     How often do you need to have someone help you when you read instructions, pamphlets, or other written materials from your doctor or pharmacy?: 1 - Never  Interpreter Needed?: No  Information entered by :: Verdie Saba, CMA   Activities of Daily Living     08/22/2023    2:13 PM 08/18/2023    9:08 AM  In your present state of health, do you have any difficulty performing the following activities:  Hearing? 0 0  Vision? 0 0  Difficulty concentrating or making decisions? 0 0   Walking or climbing stairs? 0 0  Dressing or bathing? 1 1  Doing errands, shopping? 0 0  Preparing Food and eating ? N N  Using the Toilet? N N  In the past six months, have you accidently leaked urine? Y Y  Do you have problems with loss of bowel control? N N  Managing your Medications? N N  Managing your Finances? N N  Housekeeping or managing your Housekeeping? N N    Patient Care Team: Plotnikov, Karlynn GAILS, MD as PCP - General Ernie Cough, MD as Consulting Physician (Orthopedic Surgery) Napoleon Ozell Rush, MD as Referring Physician (Orthopedic Surgery) Comer, Lamar ORN, MD as Consulting Physician (Infectious Diseases) Walsh, Rabina Kochar, MD as Referring Physician (Dermatology)  I have updated your Care Teams any recent Medical Services you may have received from other providers in the past year.     Assessment:   This is a routine wellness examination for Chris Obrien.  Hearing/Vision screen Hearing Screening - Comments:: Denies hearing difficulties   Vision Screening - Comments:: Wears rx glasses - up to date with routine eye exams with  [facility]   Goals Addressed               This Visit's Progress     Patient Stated (pt-stated)        Patient stated he plans to continue managing my health       Depression Screen     08/22/2023    2:14 PM 07/28/2023   10:50 AM 09/02/2022    1:46 PM 07/08/2022    3:03 PM 05/14/2022    2:15 PM 01/30/2022    8:05 AM 05/07/2021    1:16 PM  PHQ 2/9 Scores  PHQ - 2 Score 0 0 0 0 0 0 0  PHQ- 9 Score 0  5 0 0      Fall Risk     08/22/2023    2:13 PM 08/18/2023    9:08 AM 07/28/2023   10:50 AM 05/11/2023    8:52 AM 09/02/2022    1:45 PM  Fall Risk   Falls in the past year? 0 1 0 1 0  Comment confirmed no falls per pt      Number falls in past yr: 0  0 0 0  Injury with Fall? 0 0 0 0 0  Risk for fall due to : No Fall Risks  No Fall Risks  No Fall Risks  Follow up Falls evaluation completed;Falls prevention discussed  Falls  evaluation completed  Falls evaluation completed    MEDICARE RISK AT HOME:  Medicare Risk at Home Any stairs in or around the home?: Yes If so, are there any without handrails?: No Home free of loose throw rugs in walkways, pet beds, electrical cords, etc?: Yes Adequate lighting in your home to reduce risk of falls?: Yes Life alert?: No Use of a cane, walker or w/c?: No Grab bars in the bathroom?: Yes Shower chair or bench in shower?: No Elevated toilet seat or a handicapped toilet?: Yes  TIMED UP AND GO:  Was the test performed?  No  Cognitive Function: 6CIT completed        08/22/2023    2:20 PM 05/14/2022    2:18 PM  6CIT Screen  What Year? 0 points 0 points  What month? 0 points 0 points  What time? 0 points 0 points  Count back from 20 0 points 0 points  Months in reverse 0 points 0 points  Repeat phrase 0 points 0 points  Total Score 0 points 0 points    Immunizations Immunization History  Administered Date(s) Administered   Fluad Quad(high Dose 65+) 11/25/2019, 10/04/2021   Influenza Split 09/23/2011   Influenza, High Dose Seasonal PF 10/26/2016, 10/15/2017   Influenza,inj,Quad PF,6+ Mos 12/07/2012, 12/17/2013, 09/10/2018   Influenza-Unspecified 09/02/2018, 01/24/2021   Moderna Sars-Covid-2 Vaccination 01/26/2019, 02/22/2019, 11/08/2019, 11/17/2020   PNEUMOCOCCAL CONJUGATE-20 10/04/2021   Pneumococcal Conjugate-13 06/12/2015   Pneumococcal Polysaccharide-23 12/21/2013   Td 01/08/2008, 10/08/2019   Zoster Recombinant(Shingrix) 08/13/2016, 10/18/2016   Zoster, Live 12/17/2013    Screening Tests Health Maintenance  Topic Date Due   Colonoscopy  04/20/2019   COVID-19 Vaccine (5 - 2024-25 season) 09/08/2022   INFLUENZA VACCINE  08/08/2023   Medicare Annual Wellness (AWV)  08/21/2024   DTaP/Tdap/Td (3 - Tdap) 10/07/2029   Pneumococcal Vaccine: 50+ Years  Completed   Hepatitis C Screening  Completed   Zoster Vaccines- Shingrix  Completed   HPV VACCINES   Aged Out   Meningococcal B Vaccine  Aged Out   Pneumococcal Vaccine  Discontinued    Health Maintenance  Health Maintenance Due  Topic Date Due   Colonoscopy  04/20/2019   COVID-19 Vaccine (5 - 2024-25 season) 09/08/2022   INFLUENZA VACCINE  08/08/2023   Health Maintenance Items Addressed:  08/22/2023  Additional Screening:  Vision Screening: Recommended annual ophthalmology exams for early detection of glaucoma and other disorders of the eye. Would you like a referral to an eye doctor? No    Dental Screening: Recommended annual dental exams for proper oral hygiene  Community Resource Referral / Chronic Care Management: CRR required this visit?  No   CCM required this visit?  No   Plan:    I have personally reviewed and noted the following in the patient's chart:   Medical and social history Use of alcohol, tobacco or illicit drugs  Current medications and supplements including opioid prescriptions. Patient is not currently taking opioid prescriptions. Functional ability and status Nutritional status Physical activity Advanced directives List of other physicians Hospitalizations, surgeries, and ER visits in previous 12 months Vitals Screenings to include cognitive, depression, and falls Referrals and appointments  In addition, I have reviewed and discussed with patient certain preventive protocols, quality metrics, and best practice recommendations. A written personalized care plan for preventive services as well as general preventive health recommendations were provided to patient.   Verdie CHRISTELLA Saba, CMA   08/22/2023   After Visit Summary: (MyChart) Due to this being a telephonic visit, the after visit summary with patients personalized plan was offered to patient via MyChart   Notes: Nothing significant to report at this time.

## 2023-09-08 ENCOUNTER — Encounter: Payer: Self-pay | Admitting: Gastroenterology

## 2023-09-09 NOTE — Telephone Encounter (Signed)
 Mr. Chris Obrien, I am glad to hear that you have been doing well with the PPI.  I am interested by what concerns you have come across in regards to the omeprazole  and the potential side effect profile with methotrexate.  We could consider lower dose PPI.  If you want to continue to hold on omeprazole  use, then I would recommend restarting a medicine you have been on before Pepcid  20 mg once to twice daily.  This will help decrease acid and will hopefully also continue to allow healing of the esophagus. Good luck and good health.  Aloha Finner, MD

## 2023-09-19 ENCOUNTER — Encounter: Payer: Self-pay | Admitting: Internal Medicine

## 2023-09-22 ENCOUNTER — Other Ambulatory Visit: Payer: Self-pay | Admitting: Internal Medicine

## 2023-09-22 MED ORDER — AMLODIPINE BESYLATE 5 MG PO TABS: 5.0000 mg | ORAL_TABLET | Freq: Every day | ORAL | 11 refills | Status: DC

## 2023-09-29 ENCOUNTER — Ambulatory Visit (INDEPENDENT_AMBULATORY_CARE_PROVIDER_SITE_OTHER): Admitting: Internal Medicine

## 2023-09-29 ENCOUNTER — Encounter: Payer: Self-pay | Admitting: Infectious Disease

## 2023-09-29 ENCOUNTER — Encounter: Payer: Self-pay | Admitting: Internal Medicine

## 2023-09-29 VITALS — BP 140/76 | HR 72 | Temp 98.5°F | Ht 73.0 in | Wt 241.4 lb

## 2023-09-29 DIAGNOSIS — E291 Testicular hypofunction: Secondary | ICD-10-CM

## 2023-09-29 DIAGNOSIS — R972 Elevated prostate specific antigen [PSA]: Secondary | ICD-10-CM | POA: Diagnosis not present

## 2023-09-29 DIAGNOSIS — Z889 Allergy status to unspecified drugs, medicaments and biological substances status: Secondary | ICD-10-CM

## 2023-09-29 DIAGNOSIS — S42301A Unspecified fracture of shaft of humerus, right arm, initial encounter for closed fracture: Secondary | ICD-10-CM | POA: Insufficient documentation

## 2023-09-29 DIAGNOSIS — Z96619 Presence of unspecified artificial shoulder joint: Secondary | ICD-10-CM | POA: Insufficient documentation

## 2023-09-29 DIAGNOSIS — D649 Anemia, unspecified: Secondary | ICD-10-CM | POA: Diagnosis not present

## 2023-09-29 DIAGNOSIS — R42 Dizziness and giddiness: Secondary | ICD-10-CM

## 2023-09-29 LAB — CBC WITH DIFFERENTIAL/PLATELET
Basophils Absolute: 0 K/uL (ref 0.0–0.1)
Basophils Relative: 0.4 % (ref 0.0–3.0)
Eosinophils Absolute: 0.6 K/uL (ref 0.0–0.7)
Eosinophils Relative: 7.1 % — ABNORMAL HIGH (ref 0.0–5.0)
HCT: 42 % (ref 39.0–52.0)
Hemoglobin: 13.5 g/dL (ref 13.0–17.0)
Lymphocytes Relative: 17.5 % (ref 12.0–46.0)
Lymphs Abs: 1.4 K/uL (ref 0.7–4.0)
MCHC: 32.3 g/dL (ref 30.0–36.0)
MCV: 80.6 fl (ref 78.0–100.0)
Monocytes Absolute: 0.7 K/uL (ref 0.1–1.0)
Monocytes Relative: 9.2 % (ref 3.0–12.0)
Neutro Abs: 5.1 K/uL (ref 1.4–7.7)
Neutrophils Relative %: 65.8 % (ref 43.0–77.0)
Platelets: 482 K/uL — ABNORMAL HIGH (ref 150.0–400.0)
RBC: 5.21 Mil/uL (ref 4.22–5.81)
RDW: 19.3 % — ABNORMAL HIGH (ref 11.5–15.5)
WBC: 7.8 K/uL (ref 4.0–10.5)

## 2023-09-29 LAB — COMPREHENSIVE METABOLIC PANEL WITH GFR
ALT: 20 U/L (ref 0–53)
AST: 17 U/L (ref 0–37)
Albumin: 3.7 g/dL (ref 3.5–5.2)
Alkaline Phosphatase: 82 U/L (ref 39–117)
BUN: 19 mg/dL (ref 6–23)
CO2: 28 meq/L (ref 19–32)
Calcium: 8.5 mg/dL (ref 8.4–10.5)
Chloride: 100 meq/L (ref 96–112)
Creatinine, Ser: 1.44 mg/dL (ref 0.40–1.50)
GFR: 47.61 mL/min — ABNORMAL LOW (ref >60.00)
Glucose, Bld: 138 mg/dL — ABNORMAL HIGH (ref 70–99)
Potassium: 4.5 meq/L (ref 3.5–5.1)
Sodium: 139 meq/L (ref 135–145)
Total Bilirubin: 0.4 mg/dL (ref 0.2–1.2)
Total Protein: 6.6 g/dL (ref 6.0–8.3)

## 2023-09-29 LAB — SEDIMENTATION RATE: Sed Rate: 74 mm/h — ABNORMAL HIGH (ref 0–20)

## 2023-09-29 LAB — PSA: PSA: 7.2 ng/mL — ABNORMAL HIGH (ref 0.10–4.00)

## 2023-09-29 MED ORDER — CELECOXIB 200 MG PO CAPS: 200.0000 mg | ORAL_CAPSULE | Freq: Every day | ORAL | 0 refills | Status: DC | PRN

## 2023-09-29 MED ORDER — TIRZEPATIDE-WEIGHT MANAGEMENT 2.5 MG/0.5ML ~~LOC~~ SOLN: 2.5000 mg | SUBCUTANEOUS | 5 refills | Status: AC

## 2023-09-29 NOTE — Patient Instructions (Addendum)
 Reduce Tamsulosin  to one if possible  SBP 140 supine SBP130 standing

## 2023-09-29 NOTE — Assessment & Plan Note (Signed)
 SBP 140 supine SBP130 standing - dizzy Reduce Tamsulosin  to one if possible Take Norvasc  at night

## 2023-09-29 NOTE — Assessment & Plan Note (Signed)
 F/u w/Dr Van Damm

## 2023-09-29 NOTE — Assessment & Plan Note (Signed)
 On Testosterone  IM.  Potential benefits of a long term testosterone  use as well as potential risks (BPH, MI, OSA, cancer)  and complications were explained to the patient and were aknowledged.

## 2023-09-29 NOTE — Progress Notes (Unsigned)
 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   Dermatitis 09/02/2022   GERD (gastroesophageal reflux disease)    rare    Insomnia, unspecified    Oligospermia    OSA on CPAP    PT LOST  WEIGHT - NO LONGER USING CPAP   Pain    RIGHT SHOULDER DELTOID MUSCLE ATROPHY - LOSS OF STRENGT VERY LIMITED RANGE OF MOTTION   Right knee skin infection    Sleep apnea    weight loss so no cpap now    Swelling of joint of right knee    Urticaria     Past Surgical History:  Procedure Laterality Date   ADENOIDECTOMY     COLONOSCOPY     EXCISIONAL TOTAL KNEE ARTHROPLASTY WITH ANTIBIOTIC SPACERS Right 04/27/2012   Procedure: EXCISIONAL TOTAL KNEE ARTHROPLASTY WITH ANTIBIOTIC SPACERS;  Surgeon: Donnice JONETTA Car, MD;  Location: [facility];  Service: Orthopedics;  Laterality: Right;  SAME DAY LABS   FRACTURE SURGERY  12/2020   IRRIGATION AND DEBRIDEMENT KNEE Right 04/02/2012   Procedure: IRRIGATION AND DEBRIDEMENT RIGHT PRE-PATELLA  ;  Surgeon: Donnice JONETTA Car, MD;  Location: [facility];  Service: Orthopedics;  Laterality: Right;   JOINT REPLACEMENT  [id number]   KNEE ARTHROSCOPY Right 08/2010   POLYPECTOMY     SINOSCOPY     TONSILLECTOMY     TOTAL KNEE ARTHROPLASTY  01/28/2012   Procedure: TOTAL KNEE ARTHROPLASTY;  Surgeon: Donnice JONETTA Car, MD;  Location: [facility];  Service: Orthopedics;  Laterality: Right;   TOTAL KNEE ARTHROPLASTY Left 07/17/2015   Procedure:  LEFT TOTAL KNEE ARTHROPLASTY;  Surgeon: Donnice Car, MD;  Location: [facility];  Service: Orthopedics;  Laterality: Left;   TOTAL KNEE REVISION Right 06/15/2012   Procedure: reimplantation of right total knee ;  Surgeon: Donnice JONETTA Car, MD;  Location: [facility];  Service: Orthopedics;  Laterality: Right;   TOTAL SHOULDER REPLACEMENT Bilateral    done in [place]    ULNAR NERVE REPAIR Right 2008    Family History  Problem Relation Age of Onset   Alzheimer's disease Mother    Arthritis Father    Diabetes Brother    Hyperlipidemia Other    Colon cancer Neg Hx    Colon polyps Neg Hx    Esophageal cancer Neg Hx    Stomach cancer Neg Hx    Rectal cancer Neg Hx       Social History   Socioeconomic History   Marital status: Married    Spouse name: Not on file   Number of children: Not on file   Years of education: 16   Highest education level: Bachelor's degree (e.g., BA, AB, BS)  Occupational History   Occupation: Production designer, theatre/television/film    Comment: Marketing executive   Occupation: MANAGER    Employer: [employer]  Tobacco Use   Smoking status: Former    Types: Cigars  Quit date: 21    Years since quitting: 35.7   Smokeless tobacco: Never  Vaping Use   Vaping status: Never Used  Substance and Sexual Activity   Alcohol use: Not Currently    Comment: occasionally   Drug use: No   Sexual activity: Not on file  Other Topics Concern   Not on file  Social History Narrative   ** Merged History Encounter **       Caffeine 4 cups avg daily, 4 cans soda.   Social Drivers of Corporate investment banker Strain: Low Risk  (08/22/2023)   Overall Financial Resource Strain (CARDIA)    Difficulty of Paying Living Expenses: Not hard at all  Food Insecurity: No Food Insecurity (08/22/2023)   Hunger Vital Sign    Worried About Running Out of Food in the Last Year: Never true    Ran Out of Food in the Last Year: Never true  Transportation Needs: No Transportation Needs (08/22/2023)   PRAPARE -  Administrator, Civil Service (Medical): No    Lack of Transportation (Non-Medical): No  Physical Activity: Sufficiently Active (08/22/2023)   Exercise Vital Sign    Days of Exercise per Week: 4 days    Minutes of Exercise per Session: 40 min  Stress: No Stress Concern Present (08/22/2023)   [employer] of Occupational Health - Occupational Stress Questionnaire    Feeling of Stress: Not at all  Social Connections: Moderately Integrated (08/22/2023)   Social Connection and Isolation Panel    Frequency of Communication with Friends and Family: More than three times a week    Frequency of Social Gatherings with Friends and Family: Three times a week    Attends Religious Services: Never    Active Member of Clubs or Organizations: Yes    Attends Banker Meetings: Never    Marital Status: Married    Allergies  Allergen Reactions   Amoxicillin  Hives   Vancomycin  Rash    Severe redness all over body with swollen ankles and hands   Lactose Intolerance (Gi) Other (See Comments)    Red on skin   Oxycodone  Rash   Penicillins Itching and Rash   Tramadol  Itching    2019 Shoulder post-op     Current Outpatient Medications:    amLODipine  (NORVASC ) 5 MG tablet, Take 1 tablet (5 mg total) by mouth daily., Disp: 30 tablet, Rfl: 11   B Complex-C-Folic Acid  (B COMPLEX-VITAMIN C-FOLIC ACID ) 1 MG tablet, Take 1 tablet by mouth daily with breakfast., Disp: 100 tablet, Rfl: 3   B-D 3CC LUER-LOK SYR 23GX1-1/2 23G X 1-1/2 3 ML MISC, As dirrected IM, Disp: 50 each, Rfl: 3   cefadroxil  (DURICEF) 500 MG capsule, TAKE ONE CAPUSLE BY MOUTH TWO TIMES DAILY., Disp: 28 capsule, Rfl: 1   celecoxib  (CELEBREX ) 200 MG capsule, Take 1-2 capsules (200-400 mg total) by mouth daily as needed., Disp: 180 capsule, Rfl: 0   Cholecalciferol (VITAMIN D3) 50 MCG (2000 UT) capsule, Take 2 capsules (4,000 Units total) by mouth daily., Disp: 100 capsule, Rfl: 3   famotidine  (PEPCID ) 20 MG tablet,  Take 20 mg by mouth 2 (two) times daily., Disp: , Rfl:    folic acid  (FOLVITE ) 1 MG tablet, Take 1 mg by mouth., Disp: , Rfl:    glycopyrrolate  (ROBINUL ) 2 MG tablet, Take 2 mg by mouth every 8 (eight) hours., Disp: , Rfl:    Melatonin 10-10 MG TBCR, Take by mouth. Bedtime, Disp: , Rfl:  methotrexate (RHEUMATREX) 2.5 MG tablet, Take 15 mg by mouth once a week., Disp: , Rfl:    Multiple Vitamin (MULTI-VITAMIN) tablet, Take 1 tablet by mouth daily., Disp: , Rfl:    omeprazole  (PRILOSEC) 40 MG capsule, Take 1 capsule (40 mg total) by mouth 2 (two) times daily before a meal for 30 days, THEN 1 capsule (40 mg total) daily. (Patient not taking: No sig reported), Disp: 90 capsule, Rfl: 6   tamsulosin  (FLOMAX ) 0.4 MG CAPS capsule, Take by mouth., Disp: , Rfl:    testosterone  cypionate (DEPOTESTOSTERONE CYPIONATE) 200 MG/ML injection, INJECT 0.5ML (100MG  TOTAL) INTO THE MUSCLE EVERY 7 DAYS - SINGLE USE VIAL - CONTENTS MUST BE DISCARDED AFTER FIRST USE, Disp: 10 mL, Rfl: 1   tirzepatide  (ZEPBOUND ) 2.5 MG/0.5ML injection vial, Inject 2.5 mg into the skin once a week., Disp: 2 mL, Rfl: 5    Review of Systems     Objective:   Physical Exam        Assessment & Plan:

## 2023-09-29 NOTE — Assessment & Plan Note (Signed)
 Check CBC

## 2023-09-29 NOTE — Progress Notes (Signed)
 Subjective:  Patient ID: Chris Obrien, male    DOB: July 07, 1950  Age: 73 y.o. MRN: [id number]  CC: Dizziness (Started about two weeks ago while in [place] . Hasn't really gotten much better. When he sits for a while and stands up hell gt dizzy and it ll go away.)   HPI Chris Obrien presents for rash - some better F/u R shoulder pain Occ dizziness - lightheadedness  Outpatient Medications Prior to Visit  Medication Sig Dispense Refill   B Complex-C-Folic Acid  (B COMPLEX-VITAMIN C-FOLIC ACID ) 1 MG tablet Take 1 tablet by mouth daily with breakfast. 100 tablet 3   B-D 3CC LUER-LOK SYR 23GX1-1/2 23G X 1-1/2 3 ML MISC As dirrected IM 50 each 3   Cholecalciferol (VITAMIN D3) 50 MCG (2000 UT) capsule Take 2 capsules (4,000 Units total) by mouth daily. 100 capsule 3   famotidine  (PEPCID ) 20 MG tablet Take 20 mg by mouth 2 (two) times daily.     folic acid  (FOLVITE ) 1 MG tablet Take 1 mg by mouth.     Melatonin 10-10 MG TBCR Take by mouth. Bedtime     methotrexate (RHEUMATREX) 2.5 MG tablet Take 15 mg by mouth once a week.     Multiple Vitamin (MULTI-VITAMIN) tablet Take 1 tablet by mouth daily.     tamsulosin  (FLOMAX ) 0.4 MG CAPS capsule Take by mouth.     testosterone  cypionate (DEPOTESTOSTERONE CYPIONATE) 200 MG/ML injection INJECT 0.5ML (100MG  TOTAL) INTO THE MUSCLE EVERY 7 DAYS - SINGLE USE VIAL - CONTENTS MUST BE DISCARDED AFTER FIRST USE 10 mL 1   amLODipine  (NORVASC ) 5 MG tablet Take 1 tablet (5 mg total) by mouth daily. 30 tablet 11   omeprazole  (PRILOSEC) 40 MG capsule Take 1 capsule (40 mg total) by mouth 2 (two) times daily before a meal for 30 days, THEN 1 capsule (40 mg total) daily. (Patient not taking: No sig reported) 90 capsule 6   cefadroxil  (DURICEF) 500 MG capsule TAKE ONE CAPUSLE BY MOUTH TWO TIMES DAILY. 28 capsule 1   glycopyrrolate  (ROBINUL ) 2 MG tablet Take 2 mg by mouth every 8 (eight) hours.     No facility-administered medications prior to visit.     ROS: Review of Systems  Constitutional:  Negative for appetite change, fatigue and unexpected weight change.  HENT:  Negative for congestion, nosebleeds, sneezing, sore throat and trouble swallowing.   Eyes:  Negative for itching and visual disturbance.  Respiratory:  Negative for cough.   Cardiovascular:  Negative for chest pain, palpitations and leg swelling.  Gastrointestinal:  Negative for abdominal distention, blood in stool, diarrhea and nausea.  Genitourinary:  Negative for frequency and hematuria.  Musculoskeletal:  Negative for back pain, gait problem, joint swelling and neck pain.  Skin:  Negative for rash.  Neurological:  Positive for dizziness and light-headedness. Negative for tremors, speech difficulty and weakness.  Psychiatric/Behavioral:  Negative for agitation, dysphoric mood and sleep disturbance. The patient is not nervous/anxious.     Objective:  BP (!) 140/76   Pulse 72   Temp 98.5 F (36.9 C) (Oral)   Ht 6' 1 (1.854 m)   Wt 241 lb 6.4 oz (109.5 kg)   SpO2 96%   BMI 31.85 kg/m   BP Readings from Last 3 Encounters:  10/07/23 133/64  10/04/23 (!) 157/79  10/04/23 (!) 171/71    Wt Readings from Last 3 Encounters:  10/07/23 232 lb (105.2 kg)  09/29/23 241 lb 6.4 oz (109.5 kg)  08/22/23 236 lb (  107 kg)    Physical Exam Constitutional:      General: He is not in acute distress.    Appearance: Normal appearance. He is well-developed.     Comments: NAD  Eyes:     Conjunctiva/sclera: Conjunctivae normal.     Pupils: Pupils are equal, round, and reactive to light.  Neck:     Thyroid : No thyromegaly.     Vascular: No JVD.  Cardiovascular:     Rate and Rhythm: Normal rate and regular rhythm.     Heart sounds: Normal heart sounds. No murmur heard.    No friction rub. No gallop.  Pulmonary:     Effort: Pulmonary effort is normal. No respiratory distress.     Breath sounds: Normal breath sounds. No wheezing or rales.  Chest:     Chest wall: No  tenderness.  Abdominal:     General: Bowel sounds are normal. There is no distension.     Palpations: Abdomen is soft. There is no mass.     Tenderness: There is no abdominal tenderness. There is no guarding or rebound.  Musculoskeletal:        General: No tenderness. Normal range of motion.     Cervical back: Normal range of motion.  Lymphadenopathy:     Cervical: No cervical adenopathy.  Skin:    General: Skin is warm and dry.     Findings: No rash.  Neurological:     Mental Status: He is alert and oriented to person, place, and time.     Cranial Nerves: No cranial nerve deficit.     Motor: No abnormal muscle tone.     Coordination: Coordination normal.     Gait: Gait normal.     Deep Tendon Reflexes: Reflexes are normal and symmetric.  Psychiatric:        Behavior: Behavior normal.        Thought Content: Thought content normal.        Judgment: Judgment normal.     Lab Results  Component Value Date   WBC 8.5 10/04/2023   HGB 15.0 10/04/2023   HCT 44.0 10/04/2023   PLT 458 (H) 10/04/2023   GLUCOSE 102 (H) 10/04/2023   CHOL 158 05/07/2023   TRIG 72.0 05/07/2023   HDL 45.20 05/07/2023   LDLDIRECT 130.9 12/08/2012   LDLCALC 98 05/07/2023   ALT 25 10/04/2023   AST 19 10/04/2023   NA 136 10/04/2023   K 4.6 10/04/2023   CL 100 10/04/2023   CREATININE 1.70 (H) 10/04/2023   BUN 22 10/04/2023   CO2 25 10/04/2023   TSH 0.95 05/07/2023   PSA 7.20 (H) 09/29/2023   INR 1.09 06/03/2012   HGBA1C 5.4 05/23/2020    CT HUMERUS RIGHT WO CONTRAST Result Date: 07/29/2023 CLINICAL DATA:  Postoperative findings in the right humerus, assessment for periprosthetic fracture EXAM: CT OF THE RIGHT HUMERUS WITHOUT CONTRAST TECHNIQUE: Multidetector CT imaging was performed according to the standard protocol. Multiplanar CT image reconstructions were also generated. RADIATION DOSE REDUCTION: This exam was performed according to the departmental dose-optimization program which includes  automated exposure control, adjustment of the mA and/or kV according to patient size and/or use of iterative reconstruction technique. 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 Although this fracture is traversed by the plate and screw fixator, I do not see any screws attaching the plate to the proximal humerus fragment. Perhaps this is partially adhered with methacrylate? There is a backed out partial screw in the vicinity of the fracture plane on image 124 series 4, a fractured distal most screw, and three screws associated with the distal fragment, 2 of which demonstrate marginal lucency favoring loosening or infection, and 1 of which just below the tip of the stem seems to be within a small fracture plane in the methacrylate as on image 47 series 6. Accordingly I am overall uncertain of the biomechanical significance of the plate and screw fixator. Meanwhile the stem of the humeral prosthesis only extends to the vicinity of the fracture itself and is not thought to substantially stabilize this nonunited fracture. Advanced osteoarthritis of the elbow. Ligaments Suboptimally assessed by CT. Muscles and Tendons Atrophic rotator cuff musculature.  Atrophic deltoid muscle. Soft tissues Right axillary clips along with abnormal right subpectoral and right axillary lymph nodes including a right axillary node measuring 1.9 cm in short axis on image 83 series 7. Thickened cutaneous and immediately subcutaneous tissues of the right breast. IMPRESSION: 1. 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 The plate and screw fixator lacks screws in the proximal fragment, and the distal fragment screws appear highly questionable from a stability point of view based on surrounding lucency, screw fracture, or discontinuity in the methacrylate into which one of the screws extends. Moreover there is lucency in between the humeral stem methacrylate and the surrounding bone suspicious for loosening or infection. 2. Advanced osteoarthritis of the elbow. 3. Atrophic rotator cuff musculature and deltoid muscle. 4. Right axillary clips along with abnormal right subpectoral and right axillary lymph nodes including a right axillary node measuring 1.9 cm in short axis. Thickened cutaneous and immediately subcutaneous tissues of the right breast. Right axillary node biopsy 10/15/2022 showed reactive lymphadenitis. Electronically Signed   By: Ryan Salvage M.D.   On: 07/29/2023 14:07    Assessment & Plan:   Problem List Items Addressed This Visit     Drug allergy   F/u w/Dr Fleeta Lim      Hypogonadism in male   On Testosterone  IM.  Potential benefits of a long term testosterone  use as well as potential risks (BPH, MI, OSA, cancer)  and complications were explained to the patient and were aknowledged.       Relevant Orders   CBC with Differential/Platelet (Completed)   Comprehensive metabolic panel with GFR (Completed)   Iron, TIBC and Ferritin Panel (Completed)   Sedimentation rate (Completed)   PSA (Completed)   Lightheadedness - Primary   SBP 140 supine SBP130 standing - dizzy Reduce Tamsulosin  to one if possible Take Norvasc  at night      Relevant Orders   CBC with Differential/Platelet (Completed)   Comprehensive metabolic panel with GFR (Completed)   Iron, TIBC and Ferritin Panel (Completed)   Sedimentation rate (Completed)   PSA (Completed)   Normocytic anemia   Check CBC      Relevant Orders   CBC with Differential/Platelet (Completed)   Iron, TIBC and Ferritin  Panel (Completed)   Other Visit Diagnoses       Elevated PSA       Relevant Orders   PSA (Completed)  Meds ordered this encounter  Medications   tirzepatide  (ZEPBOUND ) 2.5 MG/0.5ML injection vial    Sig: Inject 2.5 mg into the skin once a week.    Dispense:  2 mL    Refill:  5   celecoxib  (CELEBREX ) 200 MG capsule    Sig: Take 1-2 capsules (200-400 mg total) by mouth daily as needed.    Dispense:  180 capsule    Refill:  0      Follow-up: Return in about 3 months (around 12/29/2023) for a follow-up visit.  Marolyn Noel, MD

## 2023-09-30 ENCOUNTER — Ambulatory Visit: Payer: Self-pay | Admitting: Internal Medicine

## 2023-09-30 ENCOUNTER — Other Ambulatory Visit: Payer: Self-pay

## 2023-09-30 ENCOUNTER — Ambulatory Visit (INDEPENDENT_AMBULATORY_CARE_PROVIDER_SITE_OTHER): Admitting: Infectious Disease

## 2023-09-30 ENCOUNTER — Encounter: Payer: Self-pay | Admitting: Infectious Disease

## 2023-09-30 VITALS — BP 139/71 | HR 86 | Temp 98.5°F

## 2023-09-30 DIAGNOSIS — Z96659 Presence of unspecified artificial knee joint: Secondary | ICD-10-CM

## 2023-09-30 DIAGNOSIS — T8459XS Infection and inflammatory reaction due to other internal joint prosthesis, sequela: Secondary | ICD-10-CM

## 2023-09-30 DIAGNOSIS — Z96611 Presence of right artificial shoulder joint: Secondary | ICD-10-CM

## 2023-09-30 DIAGNOSIS — D500 Iron deficiency anemia secondary to blood loss (chronic): Secondary | ICD-10-CM

## 2023-09-30 DIAGNOSIS — A498 Other bacterial infections of unspecified site: Secondary | ICD-10-CM

## 2023-09-30 DIAGNOSIS — S42301K Unspecified fracture of shaft of humerus, right arm, subsequent encounter for fracture with nonunion: Secondary | ICD-10-CM | POA: Diagnosis not present

## 2023-09-30 DIAGNOSIS — T84038S Mechanical loosening of other internal prosthetic joint, sequela: Secondary | ICD-10-CM | POA: Diagnosis not present

## 2023-09-30 DIAGNOSIS — L308 Other specified dermatitis: Secondary | ICD-10-CM | POA: Insufficient documentation

## 2023-09-30 DIAGNOSIS — S42294A Other nondisplaced fracture of upper end of right humerus, initial encounter for closed fracture: Secondary | ICD-10-CM

## 2023-09-30 DIAGNOSIS — T8459XD Infection and inflammatory reaction due to other internal joint prosthesis, subsequent encounter: Secondary | ICD-10-CM

## 2023-09-30 DIAGNOSIS — T847XXA Infection and inflammatory reaction due to other internal orthopedic prosthetic devices, implants and grafts, initial encounter: Secondary | ICD-10-CM | POA: Insufficient documentation

## 2023-09-30 LAB — IRON,TIBC AND FERRITIN PANEL
%SAT: 8 % — ABNORMAL LOW (ref 20–48)
Ferritin: 122 ng/mL (ref 24–380)
Iron: 20 ug/dL — ABNORMAL LOW (ref 50–180)
TIBC: 261 ug/dL (ref 250–425)

## 2023-10-04 ENCOUNTER — Other Ambulatory Visit: Payer: Self-pay

## 2023-10-04 ENCOUNTER — Emergency Department (HOSPITAL_COMMUNITY)
Admission: EM | Admit: 2023-10-04 | Discharge: 2023-10-04 | Disposition: A | Discharge status: Home / Self Care | Source: Other Acute Inpatient Hospital

## 2023-10-04 ENCOUNTER — Emergency Department (HOSPITAL_COMMUNITY)

## 2023-10-04 ENCOUNTER — Ambulatory Visit
Admission: EM | Admit: 2023-10-04 | Discharge: 2023-10-04 | Disposition: A | Discharge status: Home / Self Care | Attending: Family Medicine | Admitting: Family Medicine

## 2023-10-04 ENCOUNTER — Encounter (HOSPITAL_COMMUNITY): Payer: Self-pay | Admitting: Pharmacy Technician

## 2023-10-04 DIAGNOSIS — R42 Dizziness and giddiness: Secondary | ICD-10-CM | POA: Diagnosis present

## 2023-10-04 DIAGNOSIS — R55 Syncope and collapse: Secondary | ICD-10-CM | POA: Diagnosis not present

## 2023-10-04 DIAGNOSIS — R0602 Shortness of breath: Secondary | ICD-10-CM

## 2023-10-04 DIAGNOSIS — R9431 Abnormal electrocardiogram [ECG] [EKG]: Secondary | ICD-10-CM

## 2023-10-04 LAB — URINALYSIS, ROUTINE W REFLEX MICROSCOPIC
Bilirubin Urine: NEGATIVE
Glucose, UA: NEGATIVE mg/dL
Hgb urine dipstick: NEGATIVE
Ketones, ur: NEGATIVE mg/dL
Leukocytes,Ua: NEGATIVE
Nitrite: NEGATIVE
Protein, ur: NEGATIVE mg/dL
Specific Gravity, Urine: 1.005 (ref 1.005–1.030)
pH: 6 (ref 5.0–8.0)

## 2023-10-04 LAB — COMPREHENSIVE METABOLIC PANEL WITH GFR
ALT: 25 U/L (ref 0–44)
AST: 19 U/L (ref 15–41)
Albumin: 3.1 g/dL — ABNORMAL LOW (ref 3.5–5.0)
Alkaline Phosphatase: 77 U/L (ref 38–126)
Anion gap: 11 (ref 5–15)
BUN: 19 mg/dL (ref 8–23)
CO2: 25 mmol/L (ref 22–32)
Calcium: 8.7 mg/dL — ABNORMAL LOW (ref 8.9–10.3)
Chloride: 100 mmol/L (ref 98–111)
Creatinine, Ser: 1.64 mg/dL — ABNORMAL HIGH (ref 0.61–1.24)
GFR, Estimated: 43 mL/min — ABNORMAL LOW (ref >60)
Glucose, Bld: 100 mg/dL — ABNORMAL HIGH (ref 70–99)
Potassium: 4.5 mmol/L (ref 3.5–5.1)
Sodium: 136 mmol/L (ref 135–145)
Total Bilirubin: 0.6 mg/dL (ref 0.0–1.2)
Total Protein: 7 g/dL (ref 6.5–8.1)

## 2023-10-04 LAB — CBC
HCT: 43.9 % (ref 39.0–52.0)
Hemoglobin: 13.6 g/dL (ref 13.0–17.0)
MCH: 25.6 pg — ABNORMAL LOW (ref 26.0–34.0)
MCHC: 31 g/dL (ref 30.0–36.0)
MCV: 82.5 fL (ref 80.0–100.0)
Platelets: 458 K/uL — ABNORMAL HIGH (ref 150–400)
RBC: 5.32 MIL/uL (ref 4.22–5.81)
RDW: 17.5 % — ABNORMAL HIGH (ref 11.5–15.5)
WBC: 8.5 K/uL (ref 4.0–10.5)
nRBC: 0 % (ref 0.0–0.2)

## 2023-10-04 LAB — I-STAT CHEM 8, ED
BUN: 22 mg/dL (ref 8–23)
Calcium, Ion: 1.14 mmol/L — ABNORMAL LOW (ref 1.15–1.40)
Chloride: 100 mmol/L (ref 98–111)
Creatinine, Ser: 1.7 mg/dL — ABNORMAL HIGH (ref 0.61–1.24)
Glucose, Bld: 102 mg/dL — ABNORMAL HIGH (ref 70–99)
HCT: 44 % (ref 39.0–52.0)
Hemoglobin: 15 g/dL (ref 13.0–17.0)
Potassium: 4.6 mmol/L (ref 3.5–5.1)
Sodium: 136 mmol/L (ref 135–145)
TCO2: 23 mmol/L (ref 22–32)

## 2023-10-04 LAB — TROPONIN I (HIGH SENSITIVITY)
Troponin I (High Sensitivity): 53 ng/L — ABNORMAL HIGH (ref <18)
Troponin I (High Sensitivity): 54 ng/L — ABNORMAL HIGH (ref <18)

## 2023-10-04 LAB — D-DIMER, QUANTITATIVE: D-Dimer, Quant: 4.03 ug{FEU}/mL — ABNORMAL HIGH (ref 0.00–0.50)

## 2023-10-04 LAB — BRAIN NATRIURETIC PEPTIDE: B Natriuretic Peptide: 168.4 pg/mL — ABNORMAL HIGH (ref 0.0–100.0)

## 2023-10-04 MED ORDER — IOHEXOL 350 MG/ML SOLN
75.0000 mL | Freq: Once | INTRAVENOUS | Status: AC | PRN
  Administered 2023-10-04: 75 mL via INTRAVENOUS

## 2023-10-04 NOTE — ED Provider Notes (Signed)
 [place] EMERGENCY DEPARTMENT AT [facility] Provider Note   CSN: [id number] Arrival date & time: 10/04/23  1453     Patient presents with: Dizziness   Chris Obrien is a 73 y.o. male.    Dizziness      Patient presents because of dizziness.  According to patient, he recently, with exertion he will become somewhat dizzy.  Been happening for the past week or so.  Patient states that when going from a sitting to standing position he also has been getting dizzy.  Follow-up with PCP regarding this.  Was decreased on Flomax  to twice daily to once daily.  Patient states he still having the symptoms.  Patient states that yesterday he actually had syncopal event when he bent over to pick something up and subsequent stood up.  He is never had symptoms like this in the past.  No vision changes.  No numbness or tingling anywhere.  Patient states that symptoms only are present whenever he goes from a sitting to standing position.  He also endorses some increase in shortness of breath when walking.  No chest pain.  No pleuritic chest pain or hemoptysis.  Denies any ACS history.  Patient states that he followed up cardiology back in 2020 where they did a CT coronary calcium  score which was 0.  Followed up with cardiology since.  Patient also states that he is notices pulse has been varying.  Patient noted that his pulse was started in the 40s at some point today.  Usually in the 70s to 80s.  No history of arrhythmia.  Has had a upper sore infection over the past week.  Had episode of diarrhea today.  No abdominal pain.  Tolerating p.o.  Feels like he is drinking plenty of fluid.  Previous medical history reviewed : Patient with urgent care today.  Patient was advised to come to the ED because of worsening dizziness with shortness of breath.   Prior to Admission medications   Medication Sig Start Date End Date Taking? Authorizing Provider  amLODipine  (NORVASC ) 5 MG tablet Take 1 tablet (5 mg  total) by mouth daily. 09/22/23   Plotnikov, Aleksei V, MD  B Complex-C-Folic Acid  (B COMPLEX-VITAMIN C-FOLIC ACID ) 1 MG tablet Take 1 tablet by mouth daily with breakfast. 07/10/22   Plotnikov, Aleksei V, MD  B-D 3CC LUER-LOK SYR 23GX1-1/2 23G X 1-1/2 3 ML MISC As dirrected IM 09/02/22   Plotnikov, Aleksei V, MD  cefadroxil  (DURICEF) 500 MG capsule TAKE ONE CAPUSLE BY MOUTH TWO TIMES DAILY. 08/22/23   Plotnikov, Karlynn GAILS, MD  celecoxib  (CELEBREX ) 200 MG capsule Take 1-2 capsules (200-400 mg total) by mouth daily as needed. 09/29/23   Plotnikov, Aleksei V, MD  Cholecalciferol (VITAMIN D3) 50 MCG (2000 UT) capsule Take 2 capsules (4,000 Units total) by mouth daily. 07/10/22   Plotnikov, Aleksei V, MD  famotidine  (PEPCID ) 20 MG tablet Take 20 mg by mouth 2 (two) times daily.    [provider]  folic acid  (FOLVITE ) 1 MG tablet Take 1 mg by mouth. 05/28/23 05/27/24  [provider]  glycopyrrolate  (ROBINUL ) 2 MG tablet Take 2 mg by mouth every 8 (eight) hours. 07/22/23   [provider]  Melatonin 10-10 MG TBCR Take by mouth. Bedtime    [provider]  methotrexate (RHEUMATREX) 2.5 MG tablet Take 15 mg by mouth once a week. 05/28/23   [provider]  Multiple Vitamin (MULTI-VITAMIN) tablet Take 1 tablet by mouth daily. 07/08/13  [provider]  omeprazole  (PRILOSEC) 40 MG capsule Take 1 capsule (40 mg total) by mouth 2 (two) times daily before a meal for 30 days, THEN 1 capsule (40 mg total) daily. Patient not taking: No sig reported 06/03/23 08/22/23  Mansouraty, Aloha Raddle., MD  tamsulosin  (FLOMAX ) 0.4 MG CAPS capsule Take by mouth. 02/04/21   [provider]  testosterone  cypionate (DEPOTESTOSTERONE CYPIONATE) 200 MG/ML injection INJECT 0.5ML (100MG  TOTAL) INTO THE MUSCLE EVERY 7 DAYS - SINGLE USE VIAL - CONTENTS MUST BE DISCARDED AFTER FIRST USE 06/09/23   Plotnikov, Karlynn GAILS, MD  tirzepatide  (ZEPBOUND ) 2.5 MG/0.5ML injection vial Inject 2.5 mg into  the skin once a week. 09/29/23   Plotnikov, Karlynn GAILS, MD    Allergies: Amoxicillin , Vancomycin , Lactose intolerance (gi), Oxycodone , Penicillins, and Tramadol     Review of Systems  Neurological:  Positive for dizziness.    Updated Vital Signs BP (!) 157/79   Pulse 83   Temp 98.3 F (36.8 C) (Oral)   Resp (!) 21   SpO2 97%   Physical Exam Vitals and nursing note reviewed.  Constitutional:      General: He is not in acute distress.    Appearance: He is well-developed.  HENT:     Head: Normocephalic and atraumatic.  Eyes:     Conjunctiva/sclera: Conjunctivae normal.  Cardiovascular:     Rate and Rhythm: Normal rate and regular rhythm.     Heart sounds: No murmur heard. Pulmonary:     Effort: Pulmonary effort is normal. No respiratory distress.     Breath sounds: Normal breath sounds.  Abdominal:     Palpations: Abdomen is soft.     Tenderness: There is no abdominal tenderness.  Musculoskeletal:        General: No swelling.     Cervical back: Neck supple.  Skin:    General: Skin is warm and dry.     Capillary Refill: Capillary refill takes less than 2 seconds.  Neurological:     Mental Status: He is alert and oriented to person, place, and time.  Psychiatric:        Mood and Affect: Mood normal.     (all labs ordered are listed, but only abnormal results are displayed) Labs Reviewed  COMPREHENSIVE METABOLIC PANEL WITH GFR - Abnormal; Notable for the following components:      Result Value   Glucose, Bld 100 (*)    Creatinine, Ser 1.64 (*)    Calcium  8.7 (*)    Albumin 3.1 (*)    GFR, Estimated 43 (*)    All other components within normal limits  CBC - Abnormal; Notable for the following components:   MCH 25.6 (*)    RDW 17.5 (*)    Platelets 458 (*)    All other components within normal limits  D-DIMER, QUANTITATIVE - Abnormal; Notable for the following components:   D-Dimer, Quant 4.03 (*)    All other components within normal limits  BRAIN NATRIURETIC  PEPTIDE - Abnormal; Notable for the following components:   B Natriuretic Peptide 168.4 (*)    All other components within normal limits  I-STAT CHEM 8, ED - Abnormal; Notable for the following components:   Creatinine, Ser 1.70 (*)    Glucose, Bld 102 (*)    Calcium , Ion 1.14 (*)    All other components within normal limits  TROPONIN I (HIGH SENSITIVITY) - Abnormal; Notable for the following components:   Troponin I (High Sensitivity) 53 (*)    All  other components within normal limits  TROPONIN I (HIGH SENSITIVITY) - Abnormal; Notable for the following components:   Troponin I (High Sensitivity) 54 (*)    All other components within normal limits  URINALYSIS, ROUTINE W REFLEX MICROSCOPIC  CBG MONITORING, ED    EKG: EKG Interpretation Date/Time:  Saturday October 04 2023 15:06:10 EDT Ventricular Rate:  85 PR Interval:  170 QRS Duration:  90 QT Interval:  356 QTC Calculation: 423 R Axis:   33  Text Interpretation: Sinus rhythm with occasional Premature ventricular complexes T wave abnormality, consider inferior ischemia Abnormal ECG When compared with ECG of 04-Oct-2023 14:11, PREVIOUS ECG IS PRESENT Confirmed by Simon Rea [phone]) on 10/04/2023 4:07:42 PM  Radiology: CT Angio Chest PE W and/or Wo Contrast Result Date: 10/04/2023 CLINICAL DATA:  Clinical concern for pulmonary embolism. EXAM: CT ANGIOGRAPHY CHEST WITH CONTRAST TECHNIQUE: Multidetector CT imaging of the chest was performed using the standard protocol during bolus administration of intravenous contrast. Multiplanar CT image reconstructions and MIPs were obtained to evaluate the vascular anatomy. RADIATION DOSE REDUCTION: This exam was performed according to the departmental dose-optimization program which includes automated exposure control, adjustment of the mA and/or kV according to patient size and/or use of iterative reconstruction technique. CONTRAST:  75mL OMNIPAQUE  IOHEXOL  350 MG/ML SOLN COMPARISON:   09/05/2022. FINDINGS: Cardiovascular: The heart is mildly enlarged in there is no pericardial effusion. There is mild atherosclerotic calcification of the aorta with aneurysmal dilatation of the ascending aorta measuring 4.2 cm. The pulmonary trunk is mildly distended suggesting underlying pulmonary artery hypertension. No pulmonary embolism is seen. Mediastinum/Nodes: No mediastinal or hilar lymphadenopathy bilaterally. Enlarged lymph nodes are seen in the axillary regions bilaterally. The thyroid  gland and trachea are within normal limits. There is a small hiatal hernia. Lungs/Pleura: Mild atelectasis is seen bilaterally. No consolidation, effusion, or pneumothorax. There is a stable 5 mm nodule in the right middle lobe, axial image 53. Upper Abdomen: Fatty infiltration of the liver is noted. No acute abnormality. Musculoskeletal: Arthroplasty changes are noted at the shoulders bilaterally. Degenerative changes are present in the thoracic spine. No acute osseous abnormality. Review of the MIP images confirms the above findings. IMPRESSION: 1. No evidence of pulmonary embolism or other acute process. 2. Bilateral axillary lymphadenopathy the, not significantly changed from the prior exam. 3. 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 Recommend annual imaging followup by CTA or MRA. This recommendation follows 2010 ACCF/AHA/AATS/ACR/ASA/SCA/SCAI/SIR/STS/SVM Guidelines for the Diagnosis and Management of Patients with Thoracic Aortic Disease. Circulation. 2010; 121: Z733-z630. Aortic aneurysm NOS (ICD10-I71.9) 4. Hepatic steatosis. Electronically Signed   By: Leita Birmingham M.D.   On: 10/04/2023 17:51   CT HEAD WO CONTRAST Result Date: 10/04/2023 EXAM: CT HEAD WITHOUT CONTRAST 10/04/2023 05:14:41 PM TECHNIQUE: CT of the head was performed without the administration of intravenous contrast. Automated exposure control, iterative reconstruction, and/or weight based  adjustment of the mA/kV was utilized to reduce the radiation dose to as low as reasonably achievable. COMPARISON: CT head without contrast 10/06/2012. CLINICAL HISTORY: Syncope/presyncope, cerebrovascular cause suspected. FINDINGS: BRAIN AND VENTRICLES: Atherosclerotic calcifications are present in the cavernous carotid arteries bilaterally. No hyperdense vessel is present. No acute hemorrhage. No evidence of acute infarct. No hydrocephalus. No extra-axial collection. No mass effect or midline shift. ORBITS: No acute abnormality. SINUSES: Chronic opacification of the right maxillary sinus is noted. Minimal fluid is present in the right sphenoid sinus. SOFT TISSUES AND SKULL: No acute soft tissue abnormality. No skull fracture. IMPRESSION:  1. No acute intracranial abnormality related to the clinical history of syncope/presyncope. Electronically signed by: Lonni Necessary MD 10/04/2023 05:21 PM EDT RP Workstation: [id number]     Procedures   Medications Ordered in the ED  iohexol  (OMNIPAQUE ) 350 MG/ML injection 75 mL (75 mLs Intravenous Contrast Given 10/04/23 1723)                      NIH Stroke Scale: 0              Medical Decision Making Amount and/or Complexity of Data Reviewed Labs: ordered. Radiology: ordered.  Risk Prescription drug management.    Patient presents because of dizziness.  According to patient, he recently, with exertion he will become somewhat dizzy.  Been happening for the past week or so.  Patient states that when going from a sitting to standing position he also has been getting dizzy.  Follow-up with PCP regarding this.  Was decreased on Flomax  to twice daily to once daily.  Patient states he still having the symptoms.  Patient states that yesterday he actually had syncopal event when he bent over to pick something up and subsequent stood up.  He is never had symptoms like this in the past.  No vision changes.  No numbness or tingling anywhere.  Patient states  that symptoms only are present whenever he goes from a sitting to standing position.  He also endorses some increase in shortness of breath when walking.  No chest pain.  No pleuritic chest pain or hemoptysis.  Denies any ACS history.  Patient states that he followed up cardiology back in 2020 where they did a CT coronary calcium  score which was 0.  Followed up with cardiology since.  Patient also states that he is notices pulse has been varying.  Patient noted that his pulse was started in the 40s at some point today.  Usually in the 70s to 80s.  No history of arrhythmia.  Has had a upper sore infection over the past week.  Had episode of diarrhea today.  No abdominal pain.  Tolerating p.o.  Feels like he is drinking plenty of fluid.  Previous medical history reviewed : Patient with urgent care today.  Patient was advised to come to the ED because of worsening dizziness with shortness of breath.   On exam, patient NIH is 0.  Cranials 2 through 12 intact.  No focal deficits.  Normal finger-nose.  Romberg negative.  Strength and station intact in all extremities.  Normal test of skew.  No obvious saccade.  Maybe some slight horizontal nystagmus but no rotary or vertical.  No concerns for any kind of CVA.  Neurologically intact.  No indication for MRI brain.  Cardiac telemetry reviewed: Short episode of bigeminy.  Otherwise, no arrhythmia.  Sinus rhythm.  Given shortness of breath, did perform cardiac workup.  EKG sinus rhythm.  He has some nonspecific T wave inversions for him which is new.  Troponin x 2 were flat.  They were in the 50s.  Discussed with cardiology.  Given flat troponins, they feel like he can follow-up with cardiology outpatient.  Reviewed coronary CT scan back in 2020.  Unremarkable coronary calcium  score.  Will need repeat cardiac workup.  D-dimer positive.  Obtain CTA of the chest.  No PE.  No pulmonary embolism.  Small ascending aortic aneurysm that can follow-up with cardiothoracic  surgery. Worsening CKD.  1.7.  Could be contributing to patient's elevated troponin as well.  Refer  to cardiology outpatient.     Final diagnoses:  Shortness of breath  Orthostatic dizziness    ED Discharge Orders          Ordered    Ambulatory referral to Cardiology        10/04/23 2152    Ambulatory referral to Cardiothoracic Surgery        10/04/23 2154               Simon Lavonia SAILOR, MD 10/04/23 2320

## 2023-10-04 NOTE — ED Triage Notes (Signed)
 C/O dizziness when I would stand up after sitting lately; reports having had cold sxs recently. Went to PCP 5 days ago. States dizziness has since gotten much worse, and is often associated with dyspnea. Reports syncopal episode yesterday lasting about 5 sec when in bathroom yesterday. Today noticed HR was in 40s at home, and his SaO2 was in high 80s at home.  Upon arrival to exam room, pt's HR was regular and 48 upon palpation and pt described feeling short of breath. After resting, HR up to 70s and regular.

## 2023-10-04 NOTE — Discharge Instructions (Signed)
 Please go to the emergency room for further evaluation of your symptoms

## 2023-10-04 NOTE — ED Provider Triage Note (Signed)
 Emergency Medicine Provider Triage Evaluation Note  Chris Obrien , a 73 y.o. male  was evaluated in triage.  Pt complains of syncopal event earlier today, went to urgent care for dizziness and increasing dyspnea, referred to ED for further evaluation.  He has a prior medical history of BPPV, however states that when attempting a Epley maneuver on his own this did not help resolve his dizziness.  Reported bradycardia, EKG with noted bradycardia at assessment at the urgent care hence his referral to ED.  Review of Systems  Positive: As above Negative:   Physical Exam  BP (!) 160/84 (BP Location: Right Arm)   Pulse 93   Temp 98.2 F (36.8 C)   Resp 18   SpO2 98%  Gen:   Awake, no distress   Resp:  Normal effort  MSK:   Moves extremities without difficulty  Other:  Cardiac auscultation did not show any appreciable murmur, normal S1-S2 without any extrasystolic sounds, normal rate and rhythm.  Medical Decision Making  Medically screening exam initiated at 3:30 PM.  Appropriate orders placed.  Alm JONETTA Jessee was informed that the remainder of the evaluation will be completed by another provider, this initial triage assessment does not replace that evaluation, and the importance of remaining in the ED until their evaluation is complete.  Syncope orders as noted.   Myriam Dorn [place], [place] 10/04/23 [phone]

## 2023-10-04 NOTE — ED Triage Notes (Signed)
 Pt sent here from [facility] for irregular HR.  States dizzy spells and increasing sob.

## 2023-10-04 NOTE — ED Provider Notes (Signed)
 [name]    CSN: [id number] Arrival date & time: 10/04/23  1353      History   Chief Complaint No chief complaint on file.   HPI Chris Obrien is a 73 y.o. male with a past medical history of GERD, OSA, joint replacement with wound complication presents for dizziness, shortness of breath, and syncope.  Patient reports he has been having some intermittent dizziness over the past couple weeks that seem to have worsened.  He states he feels short of breath only when he has these dizzy spells.  He denies any chest pain at this time.  No nausea or vomiting.  States yesterday he blacked out while in the bathroom after leaning over to pick something up.  It was unwitnessed but he states he was only out for about 5 seconds.  He denies hitting his head.  He has been checking his heart rate on his watch and he also has a pulse oximeter at home and states his O2 has been running in the high 80s and his heart rate sometimes in the 40s.  He does state he started semaglutide this week.  He did see his PCP couple days ago where they did decrease his tamsulosin  given the dizziness and due to his elevated blood pressure they also put him on amlodipine .  He states the dizziness is different than typical vertigo symptoms.  He does state he has had some cough congestion but feels like that is resolved.  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  He is supposed to be following up with an orthopedic surgeon in   for surgical intervention/antibiotics.  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   Oligospermia    OSA on CPAP    PT LOST  WEIGHT - NO LONGER USING CPAP   Pain    RIGHT SHOULDER DELTOID MUSCLE ATROPHY - LOSS OF STRENGT VERY LIMITED RANGE OF MOTTION   Right knee skin infection    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   Sleep apnea    weight loss so no cpap now    Swelling of joint of right knee    Urticaria     Patient Active Problem List   Diagnosis Date Noted   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   Hardware complicating wound infection 09/30/2023   Lightheadedness 09/29/2023   Fracture of right humerus 09/29/2023   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   Pain in joint of right shoulder 08/19/2023   GERD (gastroesophageal reflux disease)    Fatigue 09/02/2022   DOE (dyspnea on exertion) 09/02/2022   Dermatitis 09/02/2022   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   COVID-19 08/17/2022   Night sweats 07/08/2022   Allergic dermatitis 01/30/2022   Chronic left shoulder pain 04/27/2021   Visit for suture removal  04/27/2021   Perennial allergic rhinitis 03/28/2021   Superficial postoperative wound infection 09/07/2018   Cellulitis 09/01/2018   S/P reverse total shoulder arthroplasty, left 07/28/2018   Complications due to internal joint prosthesis, subsequent encounter 07/23/2018   Medication monitoring encounter 02/10/2018   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   Drug reaction 12/03/2017   Drug allergy 10/15/2017   Trigger finger 10/15/2017   Neoplasm of uncertain behavior of skin 10/15/2017   BPH (benign prostatic hyperplasia) 10/15/2017   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9) 01/29/2012   S/P right TKA 01/28/2012   Well adult exam 09/24/2011   Knee pain 09/24/2011   POLYP, COLON 05/30/2007    Past Surgical History:  Procedure Laterality Date   ADENOIDECTOMY     COLONOSCOPY     EXCISIONAL TOTAL KNEE ARTHROPLASTY WITH ANTIBIOTIC SPACERS Right 04/27/2012   Procedure: EXCISIONAL TOTAL KNEE ARTHROPLASTY WITH ANTIBIOTIC SPACERS;  Surgeon: Donnice JONETTA Car, MD;  Location: [facility];  Service: Orthopedics;  Laterality: Right;  SAME DAY LABS   FRACTURE SURGERY  12/2020   IRRIGATION AND DEBRIDEMENT KNEE Right 04/02/2012   Procedure: IRRIGATION AND DEBRIDEMENT RIGHT PRE-PATELLA  ;  Surgeon: Donnice JONETTA Car, MD;  Location: [facility];  Service: Orthopedics;  Laterality: Right;   JOINT REPLACEMENT  [id number]   KNEE ARTHROSCOPY Right 08/2010   POLYPECTOMY     SINOSCOPY     TONSILLECTOMY     TOTAL KNEE ARTHROPLASTY  01/28/2012   Procedure: TOTAL KNEE ARTHROPLASTY;  Surgeon: Donnice JONETTA Car, MD;  Location: [facility];  Service: Orthopedics;  Laterality: Right;   TOTAL KNEE ARTHROPLASTY Left 07/17/2015   Procedure: LEFT TOTAL KNEE ARTHROPLASTY;  Surgeon: Donnice Car, MD;  Location: [facility];  Service: Orthopedics;  Laterality: Left;   TOTAL KNEE REVISION Right 06/15/2012   Procedure: reimplantation of right total knee ;  Surgeon: Donnice JONETTA Car, MD;  Location: [facility];  Service: Orthopedics;  Laterality: Right;   TOTAL  SHOULDER REPLACEMENT Bilateral    done in [facility]    ULNAR NERVE REPAIR Right 2008       Home Medications    Prior to Admission medications   Medication Sig Start Date End Date Taking? Authorizing Provider  amLODipine  (NORVASC ) 5 MG tablet Take 1 tablet (5 mg total) by mouth daily. 09/22/23   Plotnikov, Aleksei V, MD  B Complex-C-Folic Acid  (B COMPLEX-VITAMIN C-FOLIC ACID ) 1 MG tablet Take 1 tablet by mouth daily with breakfast. 07/10/22   Plotnikov, Aleksei V, MD  B-D 3CC LUER-LOK SYR 23GX1-1/2 23G X 1-1/2 3 ML MISC As dirrected IM 09/02/22   Plotnikov, Aleksei V, MD  cefadroxil  (DURICEF) 500 MG capsule TAKE ONE CAPUSLE BY MOUTH TWO TIMES DAILY. 08/22/23   Plotnikov, Karlynn GAILS, MD  celecoxib  (CELEBREX ) 200 MG capsule Take 1-2 capsules (200-400 mg total) by mouth daily as needed. 09/29/23   Plotnikov, Aleksei V, MD  Cholecalciferol (VITAMIN D3)  50 MCG (2000 UT) capsule Take 2 capsules (4,000 Units total) by mouth daily. 07/10/22   Plotnikov, Aleksei V, MD  famotidine  (PEPCID ) 20 MG tablet Take 20 mg by mouth 2 (two) times daily.    [provider]  folic acid  (FOLVITE ) 1 MG tablet Take 1 mg by mouth. 05/28/23 05/27/24  [provider]  glycopyrrolate  (ROBINUL ) 2 MG tablet Take 2 mg by mouth every 8 (eight) hours. 07/22/23   [provider]  Melatonin 10-10 MG TBCR Take by mouth. Bedtime    [provider]  methotrexate (RHEUMATREX) 2.5 MG tablet Take 15 mg by mouth once a week. 05/28/23   [provider]  Multiple Vitamin (MULTI-VITAMIN) tablet Take 1 tablet by mouth daily. 07/08/13   [provider]  omeprazole  (PRILOSEC) 40 MG capsule Take 1 capsule (40 mg total) by mouth 2 (two) times daily before a meal for 30 days, THEN 1 capsule (40 mg total) daily. Patient not taking: No sig reported 06/03/23 08/22/23  Mansouraty, Aloha Raddle., MD  tamsulosin  (FLOMAX ) 0.4 MG CAPS capsule Take by mouth. 02/04/21   [provider]  testosterone  cypionate  (DEPOTESTOSTERONE CYPIONATE) 200 MG/ML injection INJECT 0.5ML (100MG  TOTAL) INTO THE MUSCLE EVERY 7 DAYS - SINGLE USE VIAL - CONTENTS MUST BE DISCARDED AFTER FIRST USE 06/09/23   Plotnikov, Karlynn GAILS, MD  tirzepatide  (ZEPBOUND ) 2.5 MG/0.5ML injection vial Inject 2.5 mg into the skin once a week. 09/29/23   Plotnikov, Karlynn GAILS, MD    Family History Family History  Problem Relation Age of Onset   Alzheimer's disease Mother    Arthritis Father    Diabetes Brother    Hyperlipidemia Other    Colon cancer Neg Hx    Colon polyps Neg Hx    Esophageal cancer Neg Hx    Stomach cancer Neg Hx    Rectal cancer Neg Hx     Social History Social History   Tobacco Use   Smoking status: Former    Types: Cigars    Quit date: 1990    Years since quitting: 35.7   Smokeless tobacco: Never  Vaping Use   Vaping status: Never Used  Substance Use Topics   Alcohol use: Not Currently    Comment: occasionally   Drug use: No     Allergies   Amoxicillin , Vancomycin , Lactose intolerance (gi), Oxycodone , Penicillins, and Tramadol    Review of Systems Review of Systems  Respiratory:  Positive for shortness of breath.   Neurological:  Positive for dizziness and syncope.     Physical Exam Triage Vital Signs ED Triage Vitals [10/04/23 1358]  Encounter Vitals Group     BP (!) 171/71     Girls Systolic BP Percentile      Girls Diastolic BP Percentile      Boys Systolic BP Percentile      Boys Diastolic BP Percentile      Pulse Rate (!) 46     Resp (!) 24     Temp 98 F (36.7 C)     Temp Source Oral     SpO2 96 %     Weight      Height      Head Circumference      Peak Flow      Pain Score      Pain Loc      Pain Education      Exclude from Growth Chart    No data found.  Updated Vital Signs BP (!) 171/71  Pulse (!) 46   Temp 98 F (36.7 C) (Oral)   Resp (!) 24   SpO2 96%   Visual Acuity Right Eye Distance:   Left Eye Distance:   Bilateral Distance:    Right Eye Near:    Left Eye Near:    Bilateral Near:     Physical Exam Vitals and nursing note reviewed.  Constitutional:      General: He is not in acute distress.    Appearance: Normal appearance. He is not ill-appearing.  HENT:     Head: Normocephalic and atraumatic.  Eyes:     Pupils: Pupils are equal, round, and reactive to light.  Cardiovascular:     Rate and Rhythm: Normal rate and regular rhythm.     Heart sounds: Normal heart sounds.  Pulmonary:     Effort: Pulmonary effort is normal.     Breath sounds: Normal breath sounds.  Skin:    General: Skin is warm and dry.  Neurological:     General: No focal deficit present.     Mental Status: He is alert and oriented to person, place, and time.  Psychiatric:        Mood and Affect: Mood normal.        Behavior: Behavior normal.      UC Treatments / Results  Labs (all labs ordered are listed, but only abnormal results are displayed) Labs Reviewed - No data to display  EKG   Radiology No results found.  Procedures ED EKG  Date/Time: 10/04/2023 2:26 PM  Performed by: Loreda Myla SAUNDERS, NP Authorized by: Loreda Myla SAUNDERS, NP   ECG interpreted by ED Physician in the absence of a cardiologist: no   Previous ECG:    Previous ECG:  Compared to current Interpretation:    Interpretation: abnormal   Rate:    ECG rate:  86   ECG rate assessment: normal   Rhythm:    Rhythm: sinus rhythm   T waves:    T waves: non-specific    (including critical care time)  Medications Ordered in UC Medications - No data to display  Initial Impression / Assessment and Plan / UC Course  I have reviewed the triage vital signs and the nursing notes.  Pertinent labs & imaging results that were available during my care of the patient were reviewed by me and considered in my medical decision making (see chart for details).  Clinical Course as of 10/04/23 1427  Sat Oct 04, 2023  1426 Heart rate recheck in the mid 80s [JM]    Clinical Course User  Index [JM] Loreda Myla SAUNDERS, NP    Reviewed exam and symptoms with patient.  Discussed limitations and abilities of urgent care.  Given his reported worsening dizziness with shortness of breath, known syncopal episode yesterday as well as abnormal EKG I advised to go to the emergency room for further evaluation.  He declined EMS transfer will go POV with his wife.  He has no chest pain, shortness of breath or dizziness at this time.  He was advised to pull over and call 911 for any worsening symptoms that occur in transit and he verbalized understanding. Final Clinical Impressions(s) / UC Diagnoses   Final diagnoses:  Dizziness  Shortness of breath  Syncope, unspecified syncope type  Abnormal EKG     Discharge Instructions      Please go to the emergency room for further evaluation of your symptoms    ED Prescriptions   None  PDMP not reviewed this encounter.   Loreda Myla SAUNDERS, NP 10/04/23 [phone]

## 2023-10-04 NOTE — ED Notes (Signed)
 Patient is being discharged from the Urgent Care and sent to the Emergency Department via private vehicle with spouse . Per DOROTHA Bold, NP, patient is in need of higher level of care due to dizziness, bradycardia, syncope (yesterday). Patient is aware and verbalizes understanding of plan of care.  Vitals:   10/04/23 1358  BP: (!) 171/71  Pulse: (!) 46  Resp: (!) 24  Temp: 98 F (36.7 C)  SpO2: 96%

## 2023-10-04 NOTE — Discharge Instructions (Addendum)
 As we discussed, I definitely want you to follow-up with cardiology.  I did place a referral for you.  They should call you on Monday to establish care.  If you have any kind of worsening shortness of breath or especially if you start have any kind of chest pain then please come back to the ED immediately.  Please take it easy over the next couple days.  If you go from a sitting to standing position please do so in a slow manner.  You did have an episode of bigeminy here.  Can be further explored with cardiac telemetry.  Ct scan shows the following:  3. Aortic atherosclerosis with aneurysmal dilatation of the  ascending aorta measuring 4.2 cm. Recommend annual imaging followup  by CTA or MRA. This recommendation follows 2010  ACCF/AHA/AATS/ACR/ASA/SCA/SCAI/SIR/STS/SVM Guidelines for the  Diagnosis and Management of Patients with Thoracic Aortic Disease.  Circulation. 2010; 121: [id number]. Aortic aneurysm NOS (ICD10-I71.9)    You do not need surgery for this at this moment of time but needs to be followed.  I can place a referral to cardiothoracic surgery where can  see them in clinic and they can discuss.

## 2023-10-06 ENCOUNTER — Other Ambulatory Visit: Payer: Self-pay | Admitting: Internal Medicine

## 2023-10-06 ENCOUNTER — Ambulatory Visit: Attending: Internal Medicine

## 2023-10-06 DIAGNOSIS — R55 Syncope and collapse: Secondary | ICD-10-CM

## 2023-10-06 NOTE — Progress Notes (Unsigned)
 EP to read.

## 2023-10-07 ENCOUNTER — Other Ambulatory Visit: Payer: Self-pay | Admitting: Nurse Practitioner

## 2023-10-07 ENCOUNTER — Ambulatory Visit

## 2023-10-07 ENCOUNTER — Telehealth: Payer: Self-pay

## 2023-10-07 VITALS — BP 133/64 | HR 82 | Resp 20 | Ht 73.0 in | Wt 232.0 lb

## 2023-10-07 DIAGNOSIS — I7121 Aneurysm of the ascending aorta, without rupture: Secondary | ICD-10-CM | POA: Insufficient documentation

## 2023-10-07 DIAGNOSIS — R972 Elevated prostate specific antigen [PSA]: Secondary | ICD-10-CM

## 2023-10-07 DIAGNOSIS — D509 Iron deficiency anemia, unspecified: Secondary | ICD-10-CM | POA: Insufficient documentation

## 2023-10-07 NOTE — Patient Instructions (Signed)

## 2023-10-07 NOTE — Progress Notes (Signed)
 [address] [place] [address]             [phone]            Chris Obrien [id number] Aug 02, 1950   History of Present Illness:  Chris Obrien is a 73 year old man with medical history of situational elevated blood pressure, OSA, GERD, infection of prosthetic shoulder joint, BPH, and interstitial granulomatous dermatitis who presents for initial encounter of ascending thoracic aortic aneurysm.  This was initially found on CTA of chest on 10/04/2023 when he presented to the emergency department with dizziness and shortness of breath.  CTA of chest measured aneurysm at 4.2 cm. Echocardiogram 09/2022 showed that the aortic valve is tricuspid.   He states that he is doing ok. He is still experiencing orthostatic hypotension, dizziness, and shortness of breath with exertion.  He was evaluated at the ER and is being followed by his PCP.  He is in the process of having a heart monitor, echocardiogram and stress tested ordered to evaluate his dizziness and possible syncopal episodes.  His blood pressure had been elevated recently and was started on amlodipine .  He denies chest pain and lower leg swelling.    Current Outpatient Medications on File Prior to Visit  Medication Sig Dispense Refill   B Complex-C-Folic Acid  (B COMPLEX-VITAMIN C-FOLIC ACID ) 1 MG tablet Take 1 tablet by mouth daily with breakfast. 100 tablet 3   B-D 3CC LUER-LOK SYR 23GX1-1/2 23G X 1-1/2 3 ML MISC As dirrected IM 50 each 3   celecoxib  (CELEBREX ) 200 MG capsule Take 1-2 capsules (200-400 mg total) by mouth daily as needed. 180 capsule 0   Cholecalciferol (VITAMIN D3) 50 MCG (2000 UT) capsule Take 2 capsules (4,000 Units total) by mouth daily. 100 capsule 3   famotidine  (PEPCID ) 20 MG tablet Take 20 mg by mouth 2 (two) times daily.     folic acid  (FOLVITE ) 1 MG tablet Take 1 mg by mouth.     Melatonin 10-10 MG TBCR Take by mouth. Bedtime     methotrexate (RHEUMATREX) 2.5 MG tablet  Take 15 mg by mouth once a week.     Multiple Vitamin (MULTI-VITAMIN) tablet Take 1 tablet by mouth daily.     tamsulosin  (FLOMAX ) 0.4 MG CAPS capsule Take by mouth.     testosterone  cypionate (DEPOTESTOSTERONE CYPIONATE) 200 MG/ML injection INJECT 0.5ML (100MG  TOTAL) INTO THE MUSCLE EVERY 7 DAYS - SINGLE USE VIAL - CONTENTS MUST BE DISCARDED AFTER FIRST USE 10 mL 1   tirzepatide  (ZEPBOUND ) 2.5 MG/0.5ML injection vial Inject 2.5 mg into the skin once a week. 2 mL 5   omeprazole  (PRILOSEC) 40 MG capsule Take 1 capsule (40 mg total) by mouth 2 (two) times daily before a meal for 30 days, THEN 1 capsule (40 mg total) daily. (Patient not taking: No sig reported) 90 capsule 6   No current facility-administered medications on file prior to visit.     ROS: Review of Systems  Constitutional:  Positive for malaise/fatigue. Negative for chills.  Respiratory:  Positive for shortness of breath.        With exertion  Cardiovascular: Negative.  Negative for chest pain and leg swelling.  Neurological:  Positive for dizziness.     BP 133/64   Pulse 82   Resp 20   Ht 6' 1 (1.854 m)   Wt 232 lb (105.2 kg)   SpO2 96% Comment: RA  BMI  30.61 kg/m   Physical Exam Constitutional:      Appearance: Normal appearance.  HENT:     Head: Normocephalic and atraumatic.  Cardiovascular:     Rate and Rhythm: Normal rate and regular rhythm.     Heart sounds: Normal heart sounds, S1 normal and S2 normal.  Pulmonary:     Effort: Pulmonary effort is normal.     Breath sounds: Normal breath sounds.  Skin:    General: Skin is warm and dry.  Neurological:     General: No focal deficit present.     Mental Status: He is alert and oriented to person, place, and time.      Imaging: CLINICAL DATA:  Clinical concern for pulmonary embolism.   EXAM: CT ANGIOGRAPHY CHEST WITH CONTRAST   TECHNIQUE: Multidetector CT imaging of the chest was performed using the standard protocol during bolus administration of  intravenous contrast. Multiplanar CT image reconstructions and MIPs were obtained to evaluate the vascular anatomy.   RADIATION DOSE REDUCTION: This exam was performed according to the departmental dose-optimization program which includes automated exposure control, adjustment of the mA and/or kV according to patient size and/or use of iterative reconstruction technique.   CONTRAST:  75mL OMNIPAQUE  IOHEXOL  350 MG/ML SOLN   COMPARISON:  09/05/2022.   FINDINGS: Cardiovascular: The heart is mildly enlarged in there is no pericardial effusion. There is mild atherosclerotic calcification of the aorta with aneurysmal dilatation of the ascending aorta measuring 4.2 cm. The pulmonary trunk is mildly distended suggesting underlying pulmonary artery hypertension. No pulmonary embolism is seen.   Mediastinum/Nodes: No mediastinal or hilar lymphadenopathy bilaterally. Enlarged lymph nodes are seen in the axillary regions bilaterally. The thyroid  gland and trachea are within normal limits. There is a small hiatal hernia.   Lungs/Pleura: Mild atelectasis is seen bilaterally. No consolidation, effusion, or pneumothorax. There is a stable 5 mm nodule in the right middle lobe, axial image 53.   Upper Abdomen: Fatty infiltration of the liver is noted. No acute abnormality.   Musculoskeletal: Arthroplasty changes are noted at the shoulders bilaterally. Degenerative changes are present in the thoracic spine. No acute osseous abnormality.   Review of the MIP images confirms the above findings.   IMPRESSION: 1. No evidence of pulmonary embolism or other acute process. 2. Bilateral axillary lymphadenopathy the, not significantly changed from the prior exam. 3. 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 Recommend annual imaging followup by CTA or MRA. This recommendation follows 2010 ACCF/AHA/AATS/ACR/ASA/SCA/SCAI/SIR/STS/SVM Guidelines for  the Diagnosis and Management of Patients with Thoracic Aortic Disease. Circulation. 2010; 121: [id number]. Aortic aneurysm NOS (ICD10-I71.9) 4. Hepatic steatosis.     Electronically Signed   By: Leita Birmingham M.D.   On: 10/04/2023 17:51     A/P:  Aneurysm of ascending aorta without rupture -4.2 cm ascending thoracic aortic aneurysm on CTA of chest. We discussed the natural history and and risk factors for growth of ascending aortic aneurysms. Discussed recommendations to minimize the risk of further expansion or dissection including careful blood pressure control, avoidance of contact sports and heavy lifting, attention to lipid management.  We covered the importance of continued smoking cessation.  The patient does not yet meet surgical criteria of >5.5cm. The patient is aware of signs and symptoms of aortic dissection and when to present to the emergency department  -Continue follow up with PCP for dizziness, hypotension and syncopal episodes  -Follow up in one year with CTA of chest for continued surveillance  Risk Modification:  Statin:  not currently prescribed  Smoking cessation instruction/counseling given:  commended patient for quitting and reviewed strategies for preventing relapses  Patient was counseled on importance of Blood Pressure Control  They are instructed to contact their Primary Care Physician if they start to have blood pressure readings over 130s/90s. Do not ever stop blood pressure medications on your own, unless instructed by healthcare professional.  Please avoid use of Fluoroquinolones as this can potentially increase your risk of Aortic Rupture and/or Dissection  Patient educated on signs and symptoms of Aortic Dissection, handout also provided in AVS  Manuelita CHRISTELLA Rough, PA-C 10/07/23

## 2023-10-07 NOTE — Telephone Encounter (Unsigned)
 Copied from CRM #[id number]. Topic: General - Other >> Oct 07, 2023  3:44 PM Mercedes MATSU wrote: Reason for CRM: Patient is having dizziness and shortness of breath when he walks and said that Dr. Garald is aware of this he just recently was seen. He is requesting for Dr. Josetta nurse to call him to assist with getting him scheduled earlier for his upcoming cardio follow up appointment. I offered patient to be connected to nurse triage and he declined.

## 2023-10-07 NOTE — Assessment & Plan Note (Signed)
 Cologuard 2021-negative EGD, Dr. Wilhelmenia 05/2023 -esophagitis Take oral iron

## 2023-10-08 DIAGNOSIS — R000 Tachycardia, unspecified: Secondary | ICD-10-CM

## 2023-10-08 HISTORY — DX: Tachycardia, unspecified: R00.0

## 2023-10-08 NOTE — Telephone Encounter (Signed)
 Issue being addressed via MyChart. Awaiting response from PCP

## 2023-10-11 ENCOUNTER — Other Ambulatory Visit: Payer: Self-pay | Admitting: Internal Medicine

## 2023-10-11 DIAGNOSIS — R0609 Other forms of dyspnea: Secondary | ICD-10-CM

## 2023-10-12 ENCOUNTER — Encounter: Payer: Self-pay | Admitting: Internal Medicine

## 2023-10-13 ENCOUNTER — Telehealth: Payer: Self-pay

## 2023-10-13 ENCOUNTER — Encounter

## 2023-10-13 ENCOUNTER — Ambulatory Visit: Payer: Self-pay

## 2023-10-13 NOTE — Telephone Encounter (Signed)
 FYI Only or Action Required?: Action required by provider: Please see documentation note below.  Patient was last seen in primary care on 09/29/2023 by Plotnikov, Karlynn GAILS, MD.  Called Nurse Triage reporting Release of Information.  Symptoms began a week ago.  Interventions attempted: Other: Patient was seen in ED.  Symptoms are: gradually worsening.  Triage Disposition: Information or Advice Only Call  Patient/caregiver understands and will follow disposition?: Yes    Copied from CRM [id number]. Topic: Clinical - Red Word Triage >> Oct 13, 2023  9:00 AM Chris Obrien wrote: Red Word that prompted transfer to Nurse Triage: Dizziness of shortness whenever you stand up. Decreased pulse, blood pressure goes up to 200/110. Patient was prescribed heat monitor at last visit but heart monitor fell off. Waiting for authorization for echocardiogram referral. Answer Assessment - Initial Assessment Questions Patient went to ER one week ago on Saturday. Patient states he hasn't been advised to be seen in office (PCP), patient states he is awaiting an answer from [facility] on authorization for Echocardiogram. He is stating he is willing to pay for it out of pocket but needs the order to be sent. He called Dr. Rosslyn office and they stated they have availability to do his Echo this Friday but they need the orders from PCP asap.   Patient reports he has been monitoring his BP, oxygen, and HR. He states he has brief drop in HR and Oxygen when standing up after a long period of sitting down. He states his resting HR is around 85 and it will drop to the 40's, his o2 will drop to high 70's but he recovers back to baseline within 1-2 minutes.   Patient states his BP at one point this morning was 200/110, and it has gone down to now being 160/80. This RN advised patient needs evaluation and patient states he is okay and comfortable and only wants this message passed through to Dr. Garald asap. He would  like a return call from PCP or PCP's nurse, Arthea, for further expediting of Echo order. Phone number [phone]  Protocols used: PCP Call - No Triage-A-AH

## 2023-10-13 NOTE — Telephone Encounter (Signed)
 Copied from CRM [id number]. Topic: General - Other >> Oct 13, 2023  8:42 AM Chris Obrien wrote: Reason for CRM: patient called stating the office he was referred to needs insurance approval. Gave him billings number. >> Oct 13, 2023  9:06 AM Chris Obrien wrote: Patient request expedited approval of referral for Echocardiogram. Experience shortness of breath and dizziness, decreased pulse, increased BP when standing. Transferred to triage RN. Patient says heart monitor Dr. Garald ordered fell off and he was not able to complete monitoring.

## 2023-10-13 NOTE — Telephone Encounter (Signed)
 Copied from CRM [id number]. Topic: General - Call Back - No Documentation >> Oct 13, 2023  1:07 PM Leah C wrote: Reason for CRM: Patient called back in and stated that Cardiology office did not see the referral and request for the echo stress due to them needing insurance verification. Patient stated that he would pay out of pocket if need be. He would just like to have it done as soon as possible.     [phone] (M)

## 2023-10-14 ENCOUNTER — Other Ambulatory Visit: Payer: Self-pay

## 2023-10-15 ENCOUNTER — Encounter (HOSPITAL_COMMUNITY): Payer: Self-pay | Admitting: *Deleted

## 2023-10-15 NOTE — Telephone Encounter (Signed)
 Patient has since been scheduled for echo stress test

## 2023-10-17 NOTE — Telephone Encounter (Signed)
 Dr. Rosslyn office and they stated they have availability to do his Echo this Friday but they need the orders from PCP asap.  The order for echocardiogram stress test was placed on 10/11/2023.   Thanks

## 2023-10-20 ENCOUNTER — Other Ambulatory Visit: Payer: Self-pay | Admitting: Internal Medicine

## 2023-10-20 DIAGNOSIS — R0609 Other forms of dyspnea: Secondary | ICD-10-CM

## 2023-10-21 ENCOUNTER — Telehealth (HOSPITAL_COMMUNITY): Payer: Self-pay

## 2023-10-21 NOTE — Telephone Encounter (Signed)
Spoke with the patient, detailed instructions given. He stated that he would be here for his test. S.Dannell Gortney CCT

## 2023-10-24 ENCOUNTER — Other Ambulatory Visit

## 2023-10-24 ENCOUNTER — Ambulatory Visit (HOSPITAL_BASED_OUTPATIENT_CLINIC_OR_DEPARTMENT_OTHER)
Admission: RE | Admit: 2023-10-24 | Discharge: 2023-10-24 | Disposition: A | Discharge status: Home / Self Care | Source: Ambulatory Visit | Attending: Cardiology | Admitting: Cardiology

## 2023-10-24 ENCOUNTER — Emergency Department (HOSPITAL_COMMUNITY)

## 2023-10-24 ENCOUNTER — Inpatient Hospital Stay (HOSPITAL_COMMUNITY)
Admission: EM | Admit: 2023-10-24 | Discharge: 2023-11-01 | DRG: 196 | Disposition: A | Discharge status: Home / Self Care | Source: Ambulatory Visit | Attending: Cardiology | Admitting: Cardiology

## 2023-10-24 ENCOUNTER — Ambulatory Visit (HOSPITAL_COMMUNITY)
Admission: RE | Admit: 2023-10-24 | Discharge: 2023-10-24 | Disposition: A | Discharge status: Home / Self Care | Source: Ambulatory Visit | Attending: Cardiology | Admitting: Cardiology

## 2023-10-24 ENCOUNTER — Encounter (HOSPITAL_COMMUNITY): Payer: Self-pay

## 2023-10-24 DIAGNOSIS — R0609 Other forms of dyspnea: Secondary | ICD-10-CM | POA: Insufficient documentation

## 2023-10-24 DIAGNOSIS — D8685 Sarcoid myocarditis: Secondary | ICD-10-CM | POA: Diagnosis not present

## 2023-10-24 DIAGNOSIS — Z96652 Presence of left artificial knee joint: Secondary | ICD-10-CM | POA: Diagnosis present

## 2023-10-24 DIAGNOSIS — L309 Dermatitis, unspecified: Secondary | ICD-10-CM | POA: Diagnosis present

## 2023-10-24 DIAGNOSIS — D84821 Immunodeficiency due to drugs: Secondary | ICD-10-CM | POA: Diagnosis present

## 2023-10-24 DIAGNOSIS — Z8616 Personal history of COVID-19: Secondary | ICD-10-CM

## 2023-10-24 DIAGNOSIS — L928 Other granulomatous disorders of the skin and subcutaneous tissue: Secondary | ICD-10-CM | POA: Diagnosis present

## 2023-10-24 DIAGNOSIS — E785 Hyperlipidemia, unspecified: Secondary | ICD-10-CM | POA: Diagnosis present

## 2023-10-24 DIAGNOSIS — Z91011 Allergy to milk products, unspecified: Secondary | ICD-10-CM

## 2023-10-24 DIAGNOSIS — I472 Ventricular tachycardia, unspecified: Secondary | ICD-10-CM | POA: Diagnosis not present

## 2023-10-24 DIAGNOSIS — R0602 Shortness of breath: Principal | ICD-10-CM

## 2023-10-24 DIAGNOSIS — R008 Other abnormalities of heart beat: Secondary | ICD-10-CM | POA: Diagnosis present

## 2023-10-24 DIAGNOSIS — Z885 Allergy status to narcotic agent status: Secondary | ICD-10-CM

## 2023-10-24 DIAGNOSIS — L308 Other specified dermatitis: Secondary | ICD-10-CM | POA: Diagnosis present

## 2023-10-24 DIAGNOSIS — Z87891 Personal history of nicotine dependence: Secondary | ICD-10-CM

## 2023-10-24 DIAGNOSIS — Z888 Allergy status to other drugs, medicaments and biological substances status: Secondary | ICD-10-CM

## 2023-10-24 DIAGNOSIS — Z79899 Other long term (current) drug therapy: Secondary | ICD-10-CM

## 2023-10-24 DIAGNOSIS — Z96612 Presence of left artificial shoulder joint: Secondary | ICD-10-CM | POA: Diagnosis present

## 2023-10-24 DIAGNOSIS — Z79624 Long term (current) use of inhibitors of nucleotide synthesis: Secondary | ICD-10-CM

## 2023-10-24 DIAGNOSIS — Z880 Allergy status to penicillin: Secondary | ICD-10-CM

## 2023-10-24 DIAGNOSIS — Z820 Family history of epilepsy and other diseases of the nervous system: Secondary | ICD-10-CM

## 2023-10-24 DIAGNOSIS — R911 Solitary pulmonary nodule: Secondary | ICD-10-CM | POA: Diagnosis present

## 2023-10-24 DIAGNOSIS — I422 Other hypertrophic cardiomyopathy: Secondary | ICD-10-CM | POA: Diagnosis present

## 2023-10-24 DIAGNOSIS — I21A1 Myocardial infarction type 2: Secondary | ICD-10-CM | POA: Diagnosis present

## 2023-10-24 DIAGNOSIS — G4739 Other sleep apnea: Secondary | ICD-10-CM | POA: Diagnosis present

## 2023-10-24 DIAGNOSIS — Z8261 Family history of arthritis: Secondary | ICD-10-CM

## 2023-10-24 DIAGNOSIS — R7982 Elevated C-reactive protein (CRP): Secondary | ICD-10-CM | POA: Diagnosis present

## 2023-10-24 DIAGNOSIS — M069 Rheumatoid arthritis, unspecified: Secondary | ICD-10-CM | POA: Diagnosis present

## 2023-10-24 DIAGNOSIS — R9439 Abnormal result of other cardiovascular function study: Secondary | ICD-10-CM | POA: Diagnosis present

## 2023-10-24 DIAGNOSIS — D763 Other histiocytosis syndromes: Secondary | ICD-10-CM | POA: Diagnosis present

## 2023-10-24 DIAGNOSIS — I493 Ventricular premature depolarization: Secondary | ICD-10-CM | POA: Diagnosis present

## 2023-10-24 DIAGNOSIS — Z7985 Long-term (current) use of injectable non-insulin antidiabetic drugs: Secondary | ICD-10-CM

## 2023-10-24 DIAGNOSIS — R590 Localized enlarged lymph nodes: Secondary | ICD-10-CM | POA: Diagnosis present

## 2023-10-24 DIAGNOSIS — I129 Hypertensive chronic kidney disease with stage 1 through stage 4 chronic kidney disease, or unspecified chronic kidney disease: Secondary | ICD-10-CM | POA: Diagnosis present

## 2023-10-24 DIAGNOSIS — Z860100 Personal history of colon polyps, unspecified: Secondary | ICD-10-CM

## 2023-10-24 DIAGNOSIS — Z881 Allergy status to other antibiotic agents status: Secondary | ICD-10-CM

## 2023-10-24 DIAGNOSIS — Z833 Family history of diabetes mellitus: Secondary | ICD-10-CM

## 2023-10-24 DIAGNOSIS — R61 Generalized hyperhidrosis: Secondary | ICD-10-CM | POA: Diagnosis present

## 2023-10-24 DIAGNOSIS — Z96611 Presence of right artificial shoulder joint: Secondary | ICD-10-CM | POA: Diagnosis present

## 2023-10-24 DIAGNOSIS — T8489XD Other specified complication of internal orthopedic prosthetic devices, implants and grafts, subsequent encounter: Secondary | ICD-10-CM

## 2023-10-24 DIAGNOSIS — I2510 Atherosclerotic heart disease of native coronary artery without angina pectoris: Secondary | ICD-10-CM | POA: Diagnosis present

## 2023-10-24 DIAGNOSIS — T849XXD Unspecified complication of internal orthopedic prosthetic device, implant and graft, subsequent encounter: Secondary | ICD-10-CM

## 2023-10-24 DIAGNOSIS — K219 Gastro-esophageal reflux disease without esophagitis: Secondary | ICD-10-CM | POA: Diagnosis present

## 2023-10-24 DIAGNOSIS — N1831 Chronic kidney disease, stage 3a: Secondary | ICD-10-CM | POA: Diagnosis present

## 2023-10-24 LAB — CBC WITH DIFFERENTIAL/PLATELET
Abs Immature Granulocytes: 0.02 K/uL (ref 0.00–0.07)
Basophils Absolute: 0.1 K/uL (ref 0.0–0.1)
Basophils Relative: 1 %
Eosinophils Absolute: 0.3 K/uL (ref 0.0–0.5)
Eosinophils Relative: 4 %
HCT: 41.8 % (ref 39.0–52.0)
Hemoglobin: 13.3 g/dL (ref 13.0–17.0)
Immature Granulocytes: 0 %
Lymphocytes Relative: 18 %
Lymphs Abs: 1.6 K/uL (ref 0.7–4.0)
MCH: 25.6 pg — ABNORMAL LOW (ref 26.0–34.0)
MCHC: 31.8 g/dL (ref 30.0–36.0)
MCV: 80.5 fL (ref 80.0–100.0)
Monocytes Absolute: 0.9 K/uL (ref 0.1–1.0)
Monocytes Relative: 10 %
Neutro Abs: 6.1 K/uL (ref 1.7–7.7)
Neutrophils Relative %: 67 %
Platelets: 471 K/uL — ABNORMAL HIGH (ref 150–400)
RBC: 5.19 MIL/uL (ref 4.22–5.81)
RDW: 16.9 % — ABNORMAL HIGH (ref 11.5–15.5)
WBC: 9 K/uL (ref 4.0–10.5)
nRBC: 0 % (ref 0.0–0.2)

## 2023-10-24 LAB — URINALYSIS, W/ REFLEX TO CULTURE (INFECTION SUSPECTED)
Bacteria, UA: NONE SEEN
Bilirubin Urine: NEGATIVE
Glucose, UA: NEGATIVE mg/dL
Hgb urine dipstick: NEGATIVE
Ketones, ur: NEGATIVE mg/dL
Leukocytes,Ua: NEGATIVE
Nitrite: NEGATIVE
Protein, ur: NEGATIVE mg/dL
Specific Gravity, Urine: 1.012 (ref 1.005–1.030)
pH: 5 (ref 5.0–8.0)

## 2023-10-24 LAB — COMPREHENSIVE METABOLIC PANEL WITH GFR
ALT: 22 U/L (ref 0–44)
AST: 21 U/L (ref 15–41)
Albumin: 2.9 g/dL — ABNORMAL LOW (ref 3.5–5.0)
Alkaline Phosphatase: 81 U/L (ref 38–126)
Anion gap: 10 (ref 5–15)
BUN: 25 mg/dL — ABNORMAL HIGH (ref 8–23)
CO2: 23 mmol/L (ref 22–32)
Calcium: 8.7 mg/dL — ABNORMAL LOW (ref 8.9–10.3)
Chloride: 104 mmol/L (ref 98–111)
Creatinine, Ser: 1.7 mg/dL — ABNORMAL HIGH (ref 0.61–1.24)
GFR, Estimated: 42 mL/min — ABNORMAL LOW (ref >60)
Glucose, Bld: 136 mg/dL — ABNORMAL HIGH (ref 70–99)
Potassium: 4.5 mmol/L (ref 3.5–5.1)
Sodium: 137 mmol/L (ref 135–145)
Total Bilirubin: 0.5 mg/dL (ref 0.0–1.2)
Total Protein: 6.6 g/dL (ref 6.5–8.1)

## 2023-10-24 LAB — ECHOCARDIOGRAM STRESS TEST
Area-P 1/2: 3.42 cm2
S' Lateral: 2.6 cm

## 2023-10-24 LAB — TROPONIN I (HIGH SENSITIVITY): Troponin I (High Sensitivity): 78 ng/L — ABNORMAL HIGH (ref <18)

## 2023-10-24 LAB — TSH: TSH: 0.865 u[IU]/mL (ref 0.350–4.500)

## 2023-10-24 LAB — BRAIN NATRIURETIC PEPTIDE: B Natriuretic Peptide: 155.9 pg/mL — ABNORMAL HIGH (ref 0.0–100.0)

## 2023-10-24 LAB — MAGNESIUM: Magnesium: 1.8 mg/dL (ref 1.7–2.4)

## 2023-10-24 MED ORDER — ONDANSETRON HCL 4 MG/2ML IJ SOLN: 4.0000 mg | Freq: Four times a day (QID) | INTRAMUSCULAR | Status: DC | PRN

## 2023-10-24 MED ORDER — ASPIRIN 81 MG PO CHEW
324.0000 mg | CHEWABLE_TABLET | ORAL | Status: AC
  Administered 2023-10-24: 324 mg via ORAL
  Filled 2023-10-24: qty 4

## 2023-10-24 MED ORDER — MAGNESIUM SULFATE 2 GM/50ML IV SOLN
2.0000 g | Freq: Once | INTRAVENOUS | Status: AC
  Administered 2023-10-24: 2 g via INTRAVENOUS
  Filled 2023-10-24: qty 50

## 2023-10-24 MED ORDER — ACETAMINOPHEN 325 MG PO TABS: 650.0000 mg | ORAL_TABLET | ORAL | Status: DC | PRN

## 2023-10-24 MED ORDER — NITROGLYCERIN 0.4 MG SL SUBL: 0.4000 mg | SUBLINGUAL_TABLET | SUBLINGUAL | Status: DC | PRN

## 2023-10-24 MED ORDER — METOPROLOL SUCCINATE ER 25 MG PO TB24
25.0000 mg | ORAL_TABLET | Freq: Every day | ORAL | Status: DC
  Administered 2023-10-24 – 2023-10-26 (×3): 25 mg via ORAL
  Filled 2023-10-24 (×3): qty 1

## 2023-10-24 MED ORDER — PERFLUTREN LIPID MICROSPHERE
1.0000 mL | INTRAVENOUS | Status: DC | PRN
  Administered 2023-10-24: 3 mL via INTRAVENOUS

## 2023-10-24 MED ORDER — ASPIRIN 81 MG PO TBEC
81.0000 mg | DELAYED_RELEASE_TABLET | Freq: Every day | ORAL | Status: DC
  Administered 2023-10-25 – 2023-11-01 (×8): 81 mg via ORAL
  Filled 2023-10-24 (×8): qty 1

## 2023-10-24 MED ORDER — ASPIRIN 300 MG RE SUPP: 300.0000 mg | RECTAL | Status: AC

## 2023-10-24 NOTE — ED Provider Notes (Signed)
 [place] EMERGENCY DEPARTMENT AT [facility] Provider Note   CSN: [id number] Arrival date & time: 10/24/23  2008     Patient presents with: failed his stress test   Chris Obrien is a 73 y.o. male.   The history is provided by the patient and medical records. No language interpreter was used.  Shortness of Breath Severity:  Severe Onset quality:  Gradual Duration:  2 weeks Timing:  Sporadic Progression:  Waxing and waning Chronicity:  New Context: not URI   Relieved by:  Rest Worsened by:  Exertion Ineffective treatments:  None tried Associated symptoms: no abdominal pain, no chest pain, no cough, no fever, no headaches, no hemoptysis, no sputum production, no vomiting and no wheezing        Prior to Admission medications   Medication Sig Start Date End Date Taking? Authorizing Provider  B Complex-C-Folic Acid  (B COMPLEX-VITAMIN C-FOLIC ACID ) 1 MG tablet Take 1 tablet by mouth daily with breakfast. 07/10/22   Plotnikov, Aleksei V, MD  B-D 3CC LUER-LOK SYR 23GX1-1/2 23G X 1-1/2 3 ML MISC As dirrected IM 09/02/22   Plotnikov, Aleksei V, MD  celecoxib  (CELEBREX ) 200 MG capsule Take 1-2 capsules (200-400 mg total) by mouth daily as needed. 09/29/23   Plotnikov, Aleksei V, MD  Cholecalciferol (VITAMIN D3) 50 MCG (2000 UT) capsule Take 2 capsules (4,000 Units total) by mouth daily. 07/10/22   Plotnikov, Aleksei V, MD  famotidine  (PEPCID ) 20 MG tablet Take 20 mg by mouth 2 (two) times daily.    [provider]  folic acid  (FOLVITE ) 1 MG tablet Take 1 mg by mouth. 05/28/23 05/27/24  [provider]  Melatonin 10-10 MG TBCR Take by mouth. Bedtime    [provider]  methotrexate (RHEUMATREX) 2.5 MG tablet Take 15 mg by mouth once a week. 05/28/23   [provider]  Multiple Vitamin (MULTI-VITAMIN) tablet Take 1 tablet by mouth daily. 07/08/13   [provider]  omeprazole  (PRILOSEC) 40 MG capsule Take 1 capsule (40 mg total) by mouth 2  (two) times daily before a meal for 30 days, THEN 1 capsule (40 mg total) daily. Patient not taking: No sig reported 06/03/23 08/22/23  Mansouraty, Aloha Raddle., MD  tamsulosin  (FLOMAX ) 0.4 MG CAPS capsule Take by mouth. 02/04/21   [provider]  testosterone  cypionate (DEPOTESTOSTERONE CYPIONATE) 200 MG/ML injection INJECT 0.5ML (100MG  TOTAL) INTO THE MUSCLE EVERY 7 DAYS - SINGLE USE VIAL - CONTENTS MUST BE DISCARDED AFTER FIRST USE 06/09/23   Plotnikov, Karlynn GAILS, MD  tirzepatide  (ZEPBOUND ) 2.5 MG/0.5ML injection vial Inject 2.5 mg into the skin once a week. 09/29/23   Plotnikov, Karlynn GAILS, MD    Allergies: Amoxicillin , Vancomycin , Quinolones, Lactose intolerance (gi), Oxycodone , Penicillins, and Tramadol     Review of Systems  Constitutional:  Positive for fatigue. Negative for chills and fever.  Respiratory:  Positive for shortness of breath. Negative for cough, hemoptysis, sputum production and wheezing.   Cardiovascular:  Negative for chest pain, palpitations and leg swelling.  Gastrointestinal:  Negative for abdominal pain, diarrhea, nausea and vomiting.  Genitourinary:  Negative for dysuria.  Musculoskeletal:  Negative for back pain and neck stiffness.  Neurological:  Negative for light-headedness and headaches.  Psychiatric/Behavioral:  Negative for agitation.   All other systems reviewed and are negative.   Updated Vital Signs BP (!) 157/70   Pulse 91   Temp 98.1 F (36.7 C)   Resp 18   SpO2 94%   Physical Exam Vitals and  nursing note reviewed.  Constitutional:      General: He is not in acute distress.    Appearance: He is well-developed. He is not ill-appearing, toxic-appearing or diaphoretic.  HENT:     Head: Normocephalic and atraumatic.     Nose: No congestion or rhinorrhea.     Mouth/Throat:     Mouth: Mucous membranes are moist.     Pharynx: No oropharyngeal exudate or posterior oropharyngeal erythema.  Eyes:     Extraocular Movements: Extraocular  movements intact.     Conjunctiva/sclera: Conjunctivae normal.     Pupils: Pupils are equal, round, and reactive to light.  Cardiovascular:     Rate and Rhythm: Normal rate and regular rhythm.     Heart sounds: No murmur heard. Pulmonary:     Effort: Pulmonary effort is normal. No respiratory distress.     Breath sounds: Normal breath sounds. No wheezing, rhonchi or rales.  Chest:     Chest wall: No tenderness.  Abdominal:     Palpations: Abdomen is soft.     Tenderness: There is no abdominal tenderness.  Musculoskeletal:        General: No swelling or tenderness.     Cervical back: Neck supple. No tenderness.     Right lower leg: No edema.     Left lower leg: No edema.  Skin:    General: Skin is warm and dry.     Capillary Refill: Capillary refill takes less than 2 seconds.     Findings: Erythema (on chest which is normal for him for years) present.  Neurological:     General: No focal deficit present.     Mental Status: He is alert.     (all labs ordered are listed, but only abnormal results are displayed) Labs Reviewed  CBC WITH DIFFERENTIAL/PLATELET - Abnormal; Notable for the following components:      Result Value   MCH 25.6 (*)    RDW 16.9 (*)    Platelets 471 (*)    All other components within normal limits  COMPREHENSIVE METABOLIC PANEL WITH GFR - Abnormal; Notable for the following components:   Glucose, Bld 136 (*)    BUN 25 (*)    Creatinine, Ser 1.70 (*)    Calcium  8.7 (*)    Albumin 2.9 (*)    GFR, Estimated 42 (*)    All other components within normal limits  BRAIN NATRIURETIC PEPTIDE - Abnormal; Notable for the following components:   B Natriuretic Peptide 155.9 (*)    All other components within normal limits  TROPONIN I (HIGH SENSITIVITY) - Abnormal; Notable for the following components:   Troponin I (High Sensitivity) 78 (*)    All other components within normal limits  MAGNESIUM   URINALYSIS, W/ REFLEX TO CULTURE (INFECTION SUSPECTED)  TSH   LIPID PANEL  I-STAT CHEM 8, ED  TROPONIN I (HIGH SENSITIVITY)    EKG: None  Radiology: DG Chest Portable 1 View Result Date: 10/24/2023 EXAM: 1 VIEW(S) XRAY OF THE CHEST 10/24/2023 08:29:00 PM COMPARISON: Chest x-ray 09/02/2022, CT chest 10/04/2023. CLINICAL HISTORY: vtach. Reason for exam: Failed stress test today, vtach Failed stress test today, vtach. FINDINGS: LUNGS AND PLEURA: Low lung volumes. No focal pulmonary opacity. No pulmonary edema. No pleural effusion. No pneumothorax. HEART AND MEDIASTINUM: Unchanged  cardiac and mediastinal silhouettes. 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 No acute osseous abnormality. IMPRESSION: 1. No acute cardiopulmonary process. Electronically signed by: Morgane Naveau MD 10/24/2023 08:58 PM EDT  RP Workstation: [id number]   ECHOCARDIOGRAM STRESS TEST Result Date: 10/24/2023     EXERCISE STRESS ECHO REPORT    -------------------------------------------------------------------------------- Patient Name:   Chris Obrien Date of Exam: 10/24/2023 Medical Rec #:  [id number]       Height:       73.0 in Accession #:    [id number]      Weight:       232.0 lb Date of Birth:  25-Dec-1950       BSA:          2.292 m Patient Age:    73 years        BP:           148/77 mmHg Patient Gender: M               HR:           92 bpm. Exam Location:  [address] Procedure: Limited Echo, Cardiac Doppler, Limited Color Doppler and Intracardiac            Opacification Agent                           MODIFIED REPORT: This report was modified by Vina Gull MD on 10/24/2023 due to Amend.  Indications:     R06.09 Dyspnea  History:         Patient has prior history of Echocardiogram examinations, most                  recent 08/15/2022.  Sonographer:     Waldo Guadalajara RCS Referring Phys:  MELFORD KARLYNN GAILS PLOTNIKOV Diagnosing Phys: Vina Gull MD IMPRESSIONS  1. Echo showed normal LVEF at baseline After peak exercise, LVEF had  hyperdynamic response with no new wall motion abnormalies.  2. Patient developed sustained VT that terminated spontaneously. Electrically nondiagnostic.  3. This is an inconclusive stress echocardiogram for ischemia, given VT. FINDINGS Exam Protocol: The patient exercised on a treadmill according to a Bruce protocol.  Patient Performance: The patient exercised for 1 minute and 17 seconds, achieving 2.9 METS. The baseline heart rate was 88 bpm. The heart rate at peak stress was 136 bpm. The target heart rate was calculated to be 123 bpm. The percentage of maximum predicted heart rate achieved was 94.0 %. The baseline blood pressure was 148/77 mmHg. The blood pressure at peak stress was 139/83 mmHg. The patient developed shortness of breath during the stress exam. The symptoms resolved with rest.  EKG: Resting EKG showed normal sinus rhythm with T wave inversion. The patient developed sustained ventricular tachycardia during exercise.  2D Echo Findings: The baseline ejection fraction was 60%. The peak ejection fraction at stress was 80%. Baseline regional wall motion abnormalities were not present.  Vina Gull MD Electronically signed on 10/24/2023 at 6:39:16 PM    Final (Updated)      Procedures   Medications Ordered in the ED  aspirin  EC tablet 81 mg (has no administration in time range)  nitroGLYCERIN (NITROSTAT) SL tablet 0.4 mg (has no administration in time range)  acetaminophen  (TYLENOL ) tablet 650 mg (has no administration in time range)  ondansetron  (ZOFRAN ) injection 4 mg (has no administration in time range)  metoprolol succinate (TOPROL-XL) 24 hr tablet 25 mg (25 mg Oral Given 10/24/23 2059)  magnesium  sulfate IVPB 2 g 50 mL (2 g Intravenous New Bag/Given 10/24/23 2157)  aspirin  chewable tablet 324 mg (324 mg Oral Given 10/24/23 2058)  Or  aspirin  suppository 300 mg ( Rectal See Alternative 10/24/23 2058)                                    Medical Decision Making Amount and/or  Complexity of Data Reviewed Labs: ordered. Radiology: ordered.  Risk Decision regarding hospitalization.    MOREY ANDONIAN is a 73 y.o. male with past medical history significant for GERD who was sent from cardiology for admission for concerning exertional shortness of breath fatigue and intermittent V. tach.  According to cardiology whom I spoke with, patient was having a stress test and echo today and was going in and out of V. tach.  He says that any exertion cause him to be extremely winded and fatigued but was not complaining of chest pain.  He reports at rest he is fine with no other symptoms.  Cardiology told me that they would like admitted to the cardiology service, he would like to get a cath on Monday and will need to have inflected physiology see him as well.  Otherwise he was having no other complaints and specifically denies any fevers, chills, ingestion, cough, nausea, vomiting, constipation, diarrhea, or urinary changes.  On my exam his lungs were clear and chest was nontender.  Abdomen nontender.  I did not appreciate a significant murmur and he had intact pulses.  Legs are nontender and nonedematous.  Vital signs did not reveal tachycardia or tachypnea initially.  He is afebrile.  Oxygen saturation is normal on room air.  Given the cardiology recommendation for admission by cardiology will go ahead and call them.  We will get screening labs and chest x-ray for them.  Patient is resting comfortably without acute distress and will need cardiology medicine for the outpatient cardiology team recommendation.  Cardiology saw the patient and will admit for further management.     Final diagnoses:  Exertional shortness of breath  Ventricular tachycardia (HCC)     Clinical Impression: 1. Exertional shortness of breath   2. Ventricular tachycardia (HCC)     Disposition: Admit  This note was prepared with assistance of Dragon voice recognition software. Occasional  wrong-word or sound-a-like substitutions may have occurred due to the inherent limitations of voice recognition software.      Sharnice Bosler, Lonni PARAS, MD 10/24/23 2227

## 2023-10-24 NOTE — ED Triage Notes (Signed)
 No pain needs to be monitored

## 2023-10-24 NOTE — H&P (Signed)
 Cardiology Admission History and Physical   Patient ID: Chris Obrien MRN: [id number]; DOB: [birth year]   Admission date: 10/24/2023  PCP:  Chris Karlynn GAILS, MD   [facility] Providers Cardiologist:  None       Chief Complaint:  dizziness  Patient Profile: Chris Obrien is a 73 y.o. male with no prior cardiac history who is being seen 10/24/2023 for the evaluation of VT during stress testing.  History of Present Illness: Chris Obrien was seen in the ED 9/27 for exertional dizziness associated with SOB.  ED evaluation showed flat troponins, positive d-dimer but no PE on CTA.  Referred to outpatient cardiology followup.  Today underwent stress echo.  On minute into stage I bruce he developed sustained VT.  HR at peak stress 136s from baseline of 88. BP stable throughout. C/o SOB. EF preserved and augmented with stress.  Patient had already gone home and was called by Dr. Okey with instructions to return to the ED for urgent evaluation.  Currently asymptomatic.   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   Oligospermia    OSA on CPAP    PT LOST  WEIGHT - NO LONGER USING CPAP   Pain    RIGHT SHOULDER DELTOID MUSCLE ATROPHY - LOSS OF STRENGT VERY LIMITED RANGE OF MOTTION   Right knee skin infection    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   Sleep apnea    weight loss so no cpap now    Swelling of joint of  right knee    Urticaria    Past Surgical History:  Procedure Laterality Date   ADENOIDECTOMY     COLONOSCOPY     EXCISIONAL TOTAL KNEE ARTHROPLASTY WITH ANTIBIOTIC SPACERS Right 04/27/2012   Procedure: EXCISIONAL TOTAL KNEE ARTHROPLASTY WITH ANTIBIOTIC SPACERS;  Surgeon: Donnice JONETTA Car, MD;  Location: [facility];  Service: Orthopedics;  Laterality: Right;  SAME DAY LABS   FRACTURE SURGERY  12/2020   IRRIGATION AND DEBRIDEMENT KNEE Right 04/02/2012   Procedure: IRRIGATION AND DEBRIDEMENT RIGHT PRE-PATELLA  ;  Surgeon: Donnice JONETTA Car, MD;  Location: [facility];  Service: Orthopedics;  Laterality: Right;   JOINT REPLACEMENT  [id number]   KNEE ARTHROSCOPY Right 08/2010   POLYPECTOMY     SINOSCOPY     TONSILLECTOMY     TOTAL KNEE ARTHROPLASTY  01/28/2012   Procedure: TOTAL KNEE ARTHROPLASTY;  Surgeon: Donnice JONETTA Car, MD;  Location: [facility];  Service: Orthopedics;  Laterality: Right;   TOTAL KNEE ARTHROPLASTY Left 07/17/2015   Procedure: LEFT TOTAL KNEE ARTHROPLASTY;  Surgeon: Donnice Car, MD;  Location: [facility];  Service: Orthopedics;  Laterality: Left;   TOTAL KNEE REVISION Right 06/15/2012   Procedure: reimplantation of right total knee ;  Surgeon: Donnice JONETTA Car, MD;  Location: [facility];  Service: Orthopedics;  Laterality: Right;   TOTAL SHOULDER REPLACEMENT Bilateral    done in [facility]  ULNAR NERVE REPAIR Right 2008     Medications Prior to Admission: Prior to Admission medications   Medication Sig Start Date End Date Taking? Authorizing Provider  B Complex-C-Folic Acid  (B COMPLEX-VITAMIN C-FOLIC ACID ) 1 MG tablet Take 1 tablet by mouth daily with breakfast. 07/10/22   Plotnikov, Aleksei V, MD  B-D 3CC LUER-LOK SYR 23GX1-1/2 23G X 1-1/2 3 ML MISC As dirrected IM 09/02/22   Plotnikov, Aleksei V, MD  celecoxib  (CELEBREX ) 200 MG capsule Take 1-2 capsules (200-400 mg total) by mouth daily as needed. 09/29/23   Plotnikov, Aleksei V, MD  Cholecalciferol (VITAMIN D3) 50 MCG (2000 UT)  capsule Take 2 capsules (4,000 Units total) by mouth daily. 07/10/22   Plotnikov, Aleksei V, MD  famotidine  (PEPCID ) 20 MG tablet Take 20 mg by mouth 2 (two) times daily.    [provider]  folic acid  (FOLVITE ) 1 MG tablet Take 1 mg by mouth. 05/28/23 05/27/24  [provider]  Melatonin 10-10 MG TBCR Take by mouth. Bedtime    [provider]  methotrexate (RHEUMATREX) 2.5 MG tablet Take 15 mg by mouth once a week. 05/28/23   [provider]  Multiple Vitamin (MULTI-VITAMIN) tablet Take 1 tablet by mouth daily. 07/08/13   [provider]  omeprazole  (PRILOSEC) 40 MG capsule Take 1 capsule (40 mg total) by mouth 2 (two) times daily before a meal for 30 days, THEN 1 capsule (40 mg total) daily. Patient not taking: No sig reported 06/03/23 08/22/23  Mansouraty, Aloha Raddle., MD  tamsulosin  (FLOMAX ) 0.4 MG CAPS capsule Take by mouth. 02/04/21   [provider]  testosterone  cypionate (DEPOTESTOSTERONE CYPIONATE) 200 MG/ML injection INJECT 0.5ML (100MG  TOTAL) INTO THE MUSCLE EVERY 7 DAYS - SINGLE USE VIAL - CONTENTS MUST BE DISCARDED AFTER FIRST USE 06/09/23   Plotnikov, Karlynn GAILS, MD  tirzepatide  (ZEPBOUND ) 2.5 MG/0.5ML injection vial Inject 2.5 mg into the skin once a week. 09/29/23   Plotnikov, Karlynn GAILS, MD     Allergies:    Allergies  Allergen Reactions   Amoxicillin  Hives   Vancomycin  Rash    Severe redness all over body with swollen ankles and hands   Quinolones Other (See Comments)    Ascending thoracic aortic aneurysm    Lactose Intolerance (Gi) Other (See Comments)    Red on skin   Oxycodone  Rash   Penicillins Itching and Rash   Tramadol  Itching    2019 Shoulder post-op    Social History:   Social History   Socioeconomic History   Marital status: Married    Spouse name: Not on file   Number of children: Not on file   Years of education: 16   Highest education level: Bachelor's degree (e.g., BA, AB, BS)  Occupational History    Occupation: Production designer, theatre/television/film    Comment: Marketing executive   Occupation: MANAGER    Employer: [employer]  Tobacco Use   Smoking status: Former    Types: Cigars    Quit date: 1990    Years since quitting: 35.8   Smokeless tobacco: Never  Vaping Use   Vaping status: Never Used  Substance and Sexual Activity   Alcohol use: Not Currently    Comment: occasionally   Drug use: No   Sexual activity: Not on file  Other Topics Concern   Not on file  Social History Narrative   ** Merged History Encounter **       Caffeine 4 cups avg daily, 4 cans soda.   Social Drivers  of Health   Financial Resource Strain: Low Risk  (08/22/2023)   Overall Financial Resource Strain (CARDIA)    Difficulty of Paying Living Expenses: Not hard at all  Food Insecurity: No Food Insecurity (08/22/2023)   Hunger Vital Sign    Worried About Running Out of Food in the Last Year: Never true    Ran Out of Food in the Last Year: Never true  Transportation Needs: No Transportation Needs (08/22/2023)   PRAPARE - Administrator, Civil Service (Medical): No    Lack of Transportation (Non-Medical): No  Physical Activity: Sufficiently Active (08/22/2023)   Exercise Vital Sign    Days of Exercise per Week: 4 days    Minutes of Exercise per Session: 40 min  Stress: No Stress Concern Present (08/22/2023)   [employer] of Occupational Health - Occupational Stress Questionnaire    Feeling of Stress: Not at all  Social Connections: Moderately Integrated (08/22/2023)   Social Connection and Isolation Panel    Frequency of Communication with Friends and Family: More than three times a week    Frequency of Social Gatherings with Friends and Family: Three times a week    Attends Religious Services: Never    Active Member of Clubs or Organizations: Yes    Attends Banker Meetings: Never    Marital Status: Married  Catering manager Violence: Not At Risk (08/22/2023)   Humiliation, Afraid, Rape, and  Kick questionnaire    Fear of Current or Ex-Partner: No    Emotionally Abused: No    Physically Abused: No    Sexually Abused: No     Family History:   The patient's family history includes Alzheimer's disease in his mother; Arthritis in his father; Diabetes in his brother; Hyperlipidemia in an other family member. There is no history of Colon cancer, Colon polyps, Esophageal cancer, Stomach cancer, or Rectal cancer.    ROS:  Please see the history of present illness.  All other ROS reviewed and negative.     Physical Exam/Data: Vitals:   10/24/23 2013 10/24/23 2024 10/24/23 2026 10/24/23 2030  BP: (!) 157/70   (!) 140/75  Pulse: 91   84  Resp: 18   (!) 23  Temp: 98.1 F (36.7 C)     SpO2: 94% 100%  100%  Weight:   104.3 kg   Height:   6' 1 (1.854 m)    No intake or output data in the 24 hours ending 10/24/23 2042    10/24/2023    8:26 PM 10/07/2023    3:04 PM 09/29/2023    2:14 PM  Last 3 Weights  Weight (lbs) 230 lb 232 lb 241 lb 6.4 oz  Weight (kg) 104.327 kg 105.235 kg 109.498 kg     Body mass index is 30.34 kg/m.  General:  Well nourished, well developed, in no acute distress HEENT: normal Neck: no JVD Vascular: Distal pulses 2+ bilaterally   Cardiac:  normal S1, S2; RRR; no murmur  Lungs:  clear to auscultation bilaterally, no wheezing, rhonchi or rales  Abd: soft, nontender Ext: no edema Skin: warm and dry  Neuro:  CNs 2-12 intact, no focal abnormalities noted Psych:  Normal affect   Telemetry with occasional PVC   Laboratory Data:  Chemistry pending Last TSH normal in 04/2023  Hematology Recent Labs  Lab 10/24/23 2026  WBC 9.0  RBC 5.19  HGB 13.3  HCT 41.8  MCV 80.5  MCH 25.6*  MCHC 31.8  RDW 16.9*  PLT 471*    Radiology/Studies:  ECHOCARDIOGRAM STRESS TEST Result Date: 10/24/2023     EXERCISE STRESS ECHO REPORT    -------------------------------------------------------------------------------- Patient Name:   Chris Obrien Date of  Exam: 10/24/2023 Medical Rec #:  [id number]       Height:       73.0 in Accession #:    [id number]      Weight:       232.0 lb Date of Birth:  Jun 01, 1950       BSA:          2.292 m Patient Age:    73 years        BP:           148/77 mmHg Patient Gender: M               HR:           92 bpm. Exam Location:  [address] Procedure: Limited Echo, Cardiac Doppler, Limited Color Doppler and Intracardiac            Opacification Agent                           MODIFIED REPORT: This report was modified by Vina Gull MD on 10/24/2023 due to Amend.  Indications:     R06.09 Dyspnea  History:         Patient has prior history of Echocardiogram examinations, most                  recent 08/15/2022.  Sonographer:     Waldo Guadalajara RCS Referring Phys:  MELFORD KARLYNN Obrien PLOTNIKOV Diagnosing Phys: Vina Gull MD IMPRESSIONS  1. Echo showed normal LVEF at baseline After peak exercise, LVEF had hyperdynamic response with no new wall motion abnormalies.  2. Patient developed sustained VT that terminated spontaneously. Electrically nondiagnostic.  3. This is an inconclusive stress echocardiogram for ischemia, given VT. FINDINGS Exam Protocol: The patient exercised on a treadmill according to a Bruce protocol.  Patient Performance: The patient exercised for 1 minute and 17 seconds, achieving 2.9 METS. The baseline heart rate was 88 bpm. The heart rate at peak stress was 136 bpm. The target heart rate was calculated to be 123 bpm. The percentage of maximum predicted heart rate achieved was 94.0 %. The baseline blood pressure was 148/77 mmHg. The blood pressure at peak stress was 139/83 mmHg. The patient developed shortness of breath during the stress exam. The symptoms resolved with rest.  EKG: Resting EKG showed normal sinus rhythm with T wave inversion. The patient developed sustained ventricular tachycardia during exercise.  2D Echo Findings: The baseline ejection fraction was 60%. The peak ejection fraction at stress was 80%. Baseline  regional wall motion abnormalities were not present.  Vina Gull MD Electronically signed on 10/24/2023 at 6:39:16 PM    Final (Updated)      Assessment and Plan: Observation for VT provoked during stress testing.  Currently hemodynamically stable.  Given age and risk factors, may be a candidate for ischemic risk stratification.  For tonight, start ASA and low dose beta-blockade Telemetry monitoring. Assess labs and replete lytes as needed   Risk Assessment/Risk Scores:        Code Status: Full Code  Severity of Illness: The appropriate patient status for this patient is OBSERVATION. Observation status is judged to be reasonable and necessary in order to provide the required intensity of service to ensure the patient's safety.  The patient's presenting symptoms, physical exam findings, and initial radiographic and laboratory data in the context of their medical condition is felt to place them at decreased risk for further clinical deterioration. Furthermore, it is anticipated that the patient will be medically stable for discharge from the hospital within 2 midnights of admission.   For questions or updates, please contact [facility] Please consult [email or web address] for contact info under       Signed, Andee Flatten, MD  10/24/2023 8:42 PM

## 2023-10-24 NOTE — ED Triage Notes (Signed)
 Failed his stress test earlier today  he needs to be monitored no pain anywhere

## 2023-10-25 ENCOUNTER — Other Ambulatory Visit: Payer: Self-pay

## 2023-10-25 DIAGNOSIS — N1831 Chronic kidney disease, stage 3a: Secondary | ICD-10-CM | POA: Diagnosis present

## 2023-10-25 DIAGNOSIS — Z888 Allergy status to other drugs, medicaments and biological substances status: Secondary | ICD-10-CM | POA: Diagnosis not present

## 2023-10-25 DIAGNOSIS — Z87891 Personal history of nicotine dependence: Secondary | ICD-10-CM | POA: Diagnosis not present

## 2023-10-25 DIAGNOSIS — Z91011 Allergy to milk products, unspecified: Secondary | ICD-10-CM | POA: Diagnosis not present

## 2023-10-25 DIAGNOSIS — D84821 Immunodeficiency due to drugs: Secondary | ICD-10-CM | POA: Diagnosis present

## 2023-10-25 DIAGNOSIS — Z7985 Long-term (current) use of injectable non-insulin antidiabetic drugs: Secondary | ICD-10-CM | POA: Diagnosis not present

## 2023-10-25 DIAGNOSIS — D763 Other histiocytosis syndromes: Secondary | ICD-10-CM | POA: Diagnosis present

## 2023-10-25 DIAGNOSIS — M069 Rheumatoid arthritis, unspecified: Secondary | ICD-10-CM | POA: Diagnosis present

## 2023-10-25 DIAGNOSIS — Z885 Allergy status to narcotic agent status: Secondary | ICD-10-CM | POA: Diagnosis not present

## 2023-10-25 DIAGNOSIS — R0602 Shortness of breath: Secondary | ICD-10-CM | POA: Diagnosis not present

## 2023-10-25 DIAGNOSIS — N189 Chronic kidney disease, unspecified: Secondary | ICD-10-CM | POA: Diagnosis not present

## 2023-10-25 DIAGNOSIS — E785 Hyperlipidemia, unspecified: Secondary | ICD-10-CM | POA: Diagnosis present

## 2023-10-25 DIAGNOSIS — Z79899 Other long term (current) drug therapy: Secondary | ICD-10-CM | POA: Diagnosis not present

## 2023-10-25 DIAGNOSIS — I422 Other hypertrophic cardiomyopathy: Secondary | ICD-10-CM | POA: Diagnosis present

## 2023-10-25 DIAGNOSIS — R911 Solitary pulmonary nodule: Secondary | ICD-10-CM | POA: Diagnosis not present

## 2023-10-25 DIAGNOSIS — G4739 Other sleep apnea: Secondary | ICD-10-CM | POA: Diagnosis present

## 2023-10-25 DIAGNOSIS — D8685 Sarcoid myocarditis: Secondary | ICD-10-CM | POA: Diagnosis present

## 2023-10-25 DIAGNOSIS — I428 Other cardiomyopathies: Secondary | ICD-10-CM | POA: Diagnosis not present

## 2023-10-25 DIAGNOSIS — Z833 Family history of diabetes mellitus: Secondary | ICD-10-CM | POA: Diagnosis not present

## 2023-10-25 DIAGNOSIS — I429 Cardiomyopathy, unspecified: Secondary | ICD-10-CM | POA: Diagnosis not present

## 2023-10-25 DIAGNOSIS — Z96652 Presence of left artificial knee joint: Secondary | ICD-10-CM | POA: Diagnosis present

## 2023-10-25 DIAGNOSIS — Z881 Allergy status to other antibiotic agents status: Secondary | ICD-10-CM | POA: Diagnosis not present

## 2023-10-25 DIAGNOSIS — I25110 Atherosclerotic heart disease of native coronary artery with unstable angina pectoris: Secondary | ICD-10-CM | POA: Diagnosis not present

## 2023-10-25 DIAGNOSIS — I472 Ventricular tachycardia, unspecified: Secondary | ICD-10-CM | POA: Diagnosis present

## 2023-10-25 DIAGNOSIS — I129 Hypertensive chronic kidney disease with stage 1 through stage 4 chronic kidney disease, or unspecified chronic kidney disease: Secondary | ICD-10-CM | POA: Diagnosis present

## 2023-10-25 DIAGNOSIS — Z8616 Personal history of COVID-19: Secondary | ICD-10-CM | POA: Diagnosis not present

## 2023-10-25 DIAGNOSIS — Z880 Allergy status to penicillin: Secondary | ICD-10-CM | POA: Diagnosis not present

## 2023-10-25 DIAGNOSIS — Z96611 Presence of right artificial shoulder joint: Secondary | ICD-10-CM | POA: Diagnosis present

## 2023-10-25 DIAGNOSIS — Z96612 Presence of left artificial shoulder joint: Secondary | ICD-10-CM | POA: Diagnosis present

## 2023-10-25 DIAGNOSIS — I21A1 Myocardial infarction type 2: Secondary | ICD-10-CM | POA: Diagnosis present

## 2023-10-25 LAB — LIPID PANEL
Cholesterol: 123 mg/dL (ref 0–200)
HDL: 26 mg/dL — ABNORMAL LOW (ref >40)
LDL Cholesterol: 79 mg/dL (ref 0–99)
Total CHOL/HDL Ratio: 4.7 ratio
Triglycerides: 88 mg/dL (ref <150)
VLDL: 18 mg/dL (ref 0–40)

## 2023-10-25 LAB — CBC
HCT: 38.7 % — ABNORMAL LOW (ref 39.0–52.0)
Hemoglobin: 12.2 g/dL — ABNORMAL LOW (ref 13.0–17.0)
MCH: 25.2 pg — ABNORMAL LOW (ref 26.0–34.0)
MCHC: 31.5 g/dL (ref 30.0–36.0)
MCV: 80 fL (ref 80.0–100.0)
Platelets: 439 K/uL — ABNORMAL HIGH (ref 150–400)
RBC: 4.84 MIL/uL (ref 4.22–5.81)
RDW: 17 % — ABNORMAL HIGH (ref 11.5–15.5)
WBC: 9.9 K/uL (ref 4.0–10.5)
nRBC: 0 % (ref 0.0–0.2)

## 2023-10-25 LAB — CREATININE, SERUM
Creatinine, Ser: 1.66 mg/dL — ABNORMAL HIGH (ref 0.61–1.24)
GFR, Estimated: 43 mL/min — ABNORMAL LOW (ref >60)

## 2023-10-25 LAB — TROPONIN I (HIGH SENSITIVITY): Troponin I (High Sensitivity): 82 ng/L — ABNORMAL HIGH (ref <18)

## 2023-10-25 MED ORDER — MELATONIN 5 MG PO TABS
10.0000 mg | ORAL_TABLET | Freq: Once | ORAL | Status: AC
  Administered 2023-10-25: 10 mg via ORAL
  Filled 2023-10-25: qty 2

## 2023-10-25 MED ORDER — SODIUM CHLORIDE 0.9 % IV SOLN: INTRAVENOUS | Status: DC

## 2023-10-25 MED ORDER — MELATONIN 10-10 MG PO TBCR: 10.0000 mg | EXTENDED_RELEASE_TABLET | Freq: Every evening | ORAL | Status: DC | PRN

## 2023-10-25 MED ORDER — FAMOTIDINE 20 MG PO TABS
20.0000 mg | ORAL_TABLET | Freq: Every day | ORAL | Status: DC
  Administered 2023-10-25 – 2023-10-26 (×2): 20 mg via ORAL
  Filled 2023-10-25 (×2): qty 1

## 2023-10-25 MED ORDER — ATORVASTATIN CALCIUM 80 MG PO TABS
80.0000 mg | ORAL_TABLET | Freq: Every day | ORAL | Status: DC
  Administered 2023-10-25 – 2023-10-27 (×3): 80 mg via ORAL
  Filled 2023-10-25 (×3): qty 1

## 2023-10-25 MED ORDER — PANTOPRAZOLE SODIUM 40 MG PO TBEC
40.0000 mg | DELAYED_RELEASE_TABLET | Freq: Every day | ORAL | Status: DC
Start: 2023-10-25 — End: 2023-10-27
  Administered 2023-10-25 – 2023-10-27 (×3): 40 mg via ORAL
  Filled 2023-10-25 (×3): qty 1

## 2023-10-25 MED ORDER — MELATONIN 5 MG PO TABS
5.0000 mg | ORAL_TABLET | Freq: Every evening | ORAL | Status: DC | PRN
  Filled 2023-10-25: qty 1

## 2023-10-25 MED ORDER — ADULT MULTIVITAMIN W/MINERALS CH
1.0000 | ORAL_TABLET | Freq: Every day | ORAL | Status: DC
  Administered 2023-10-25 – 2023-10-27 (×3): 1 via ORAL
  Filled 2023-10-25 (×3): qty 1

## 2023-10-25 MED ORDER — ENOXAPARIN SODIUM 40 MG/0.4ML IJ SOSY
40.0000 mg | PREFILLED_SYRINGE | INTRAMUSCULAR | Status: DC
  Administered 2023-10-25 – 2023-10-26 (×2): 40 mg via SUBCUTANEOUS
  Filled 2023-10-25 (×2): qty 0.4

## 2023-10-25 MED ORDER — RENA-VITE PO TABS
1.0000 | ORAL_TABLET | Freq: Every day | ORAL | Status: DC
  Administered 2023-10-26: 1 via ORAL
  Filled 2023-10-25 (×2): qty 1

## 2023-10-25 MED ORDER — VITAMIN D 25 MCG (1000 UNIT) PO TABS
2000.0000 [IU] | ORAL_TABLET | Freq: Every day | ORAL | Status: DC
  Administered 2023-10-25 – 2023-10-27 (×3): 2000 [IU] via ORAL
  Filled 2023-10-25 (×6): qty 2

## 2023-10-25 MED ORDER — FERROUS SULFATE 325 (65 FE) MG PO TABS
325.0000 mg | ORAL_TABLET | Freq: Every day | ORAL | Status: DC
  Administered 2023-10-26 – 2023-10-27 (×2): 325 mg via ORAL
  Filled 2023-10-25 (×2): qty 1

## 2023-10-25 MED ORDER — MYCOPHENOLATE MOFETIL 250 MG PO CAPS
500.0000 mg | ORAL_CAPSULE | Freq: Two times a day (BID) | ORAL | Status: DC
  Administered 2023-10-25 – 2023-11-01 (×15): 500 mg via ORAL
  Filled 2023-10-25 (×16): qty 2

## 2023-10-25 MED ORDER — TAMSULOSIN HCL 0.4 MG PO CAPS
0.8000 mg | ORAL_CAPSULE | Freq: Every morning | ORAL | Status: DC
  Administered 2023-10-26 – 2023-10-27 (×2): 0.8 mg via ORAL
  Filled 2023-10-25 (×2): qty 2

## 2023-10-25 NOTE — ED Notes (Signed)
 Patient is requesting medications that are on his med list but have not been verified by Osie Balm. Patient states that he has not had a visit from Pharmacy to go over his medication. This RN messaged the Attending to see if the patient medications could be put in for a one time dose. Have not heard back.

## 2023-10-25 NOTE — Progress Notes (Signed)
 Rounding Note   Patient Name: Chris Obrien Date of Encounter: 10/25/2023  [facility] Cardiologist: New  Subjective No complaints  Scheduled Meds:  aspirin  EC  81 mg Oral Daily   metoprolol succinate  25 mg Oral Daily   Continuous Infusions:  PRN Meds: acetaminophen , nitroGLYCERIN, ondansetron  (ZOFRAN ) IV   Vital Signs  Vitals:   10/25/23 0845 10/25/23 0900 10/25/23 0915 10/25/23 1000  BP: (!) 150/69 135/84 (!) 145/76 (!) 150/66  Pulse: 78 79 79 76  Resp: (!) 22 (!) 27 17   Temp:      TempSrc:      SpO2: 96% 98% 99%   Weight:      Height:        Intake/Output Summary (Last 24 hours) at 10/25/2023 1018 Last data filed at 10/25/2023 0208 Gross per 24 hour  Intake 50 ml  Output 275 ml  Net -225 ml      10/24/2023    8:26 PM 10/07/2023    3:04 PM 09/29/2023    2:14 PM  Last 3 Weights  Weight (lbs) 230 lb 232 lb 241 lb 6.4 oz  Weight (kg) 104.327 kg 105.235 kg 109.498 kg      Telemetry NSR - Personally Reviewed  ECG  N/a - Personally Reviewed  Physical Exam  GEN: No acute distress.   Neck: No JVD Cardiac: RRR, no murmurs, rubs, or gallops.  Respiratory: Clear to auscultation bilaterally. GI: Soft, nontender, non-distended  MS: No edema; No deformity. Neuro:  Nonfocal  Psych: Normal affect   Labs High Sensitivity Troponin:   Recent Labs  Lab 10/04/23 1624 10/04/23 1824 10/24/23 2026 10/24/23 2329  TROPONINIHS 53* 54* 78* 82*     Chemistry Recent Labs  Lab 10/24/23 2026  NA 137  K 4.5  CL 104  CO2 23  GLUCOSE 136*  BUN 25*  CREATININE 1.70*  CALCIUM  8.7*  MG 1.8  PROT 6.6  ALBUMIN 2.9*  AST 21  ALT 22  ALKPHOS 81  BILITOT 0.5  GFRNONAA 42*  ANIONGAP 10    Lipids  Recent Labs  Lab 10/25/23 0414  CHOL 123  TRIG 88  HDL 26*  LDLCALC 79  CHOLHDL 4.7    Hematology Recent Labs  Lab 10/24/23 2026  WBC 9.0  RBC 5.19  HGB 13.3  HCT 41.8  MCV 80.5  MCH 25.6*  MCHC 31.8  RDW 16.9*  PLT 471*    Thyroid   Recent Labs  Lab 10/24/23 2023  TSH 0.865    BNP Recent Labs  Lab 10/24/23 2023  BNP 155.9*    DDimer No results for input(s): DDIMER in the last 168 hours.   Radiology  DG Chest Portable 1 View Result Date: 10/24/2023 EXAM: 1 VIEW(S) XRAY OF THE CHEST 10/24/2023 08:29:00 PM COMPARISON: Chest x-ray 09/02/2022, CT chest 10/04/2023. CLINICAL HISTORY: vtach. Reason for exam: Failed stress test today, vtach Failed stress test today, vtach. FINDINGS: LUNGS AND PLEURA: Low lung volumes. No focal pulmonary opacity. No pulmonary edema. No pleural effusion. No pneumothorax. HEART AND MEDIASTINUM: Unchanged  cardiac and mediastinal silhouettes. 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 No acute osseous abnormality. IMPRESSION: 1. No acute cardiopulmonary process. Electronically signed by: Morgane Naveau MD 10/24/2023 08:58 PM EDT RP Workstation: [id number]   ECHOCARDIOGRAM STRESS TEST Result Date: 10/24/2023     EXERCISE STRESS ECHO REPORT    -------------------------------------------------------------------------------- Patient Name:   Chris Obrien Date of Exam: 10/24/2023 Medical Rec #:  [id number]  Height:       73.0 in Accession #:    [id number]      Weight:       232.0 lb Date of Birth:  [birth year]       BSA:          2.292 m Patient Age:    73 years        BP:           148/77 mmHg Patient Gender: M               HR:           92 bpm. Exam Location:  [address] Procedure: Limited Echo, Cardiac Doppler, Limited Color Doppler and Intracardiac            Opacification Agent                           MODIFIED REPORT: This report was modified by Vina Gull MD on 10/24/2023 due to Amend.  Indications:     R06.09 Dyspnea  History:         Patient has prior history of Echocardiogram examinations, most                  recent 08/15/2022.  Sonographer:     Waldo Guadalajara RCS Referring Phys:  MELFORD KARLYNN GAILS PLOTNIKOV Diagnosing Phys:  Vina Gull MD IMPRESSIONS  1. Echo showed normal LVEF at baseline After peak exercise, LVEF had hyperdynamic response with no new wall motion abnormalies.  2. Patient developed sustained VT that terminated spontaneously. Electrically nondiagnostic.  3. This is an inconclusive stress echocardiogram for ischemia, given VT. FINDINGS Exam Protocol: The patient exercised on a treadmill according to a Bruce protocol.  Patient Performance: The patient exercised for 1 minute and 17 seconds, achieving 2.9 METS. The baseline heart rate was 88 bpm. The heart rate at peak stress was 136 bpm. The target heart rate was calculated to be 123 bpm. The percentage of maximum predicted heart rate achieved was 94.0 %. The baseline blood pressure was 148/77 mmHg. The blood pressure at peak stress was 139/83 mmHg. The patient developed shortness of breath during the stress exam. The symptoms resolved with rest.  EKG: Resting EKG showed normal sinus rhythm with T wave inversion. The patient developed sustained ventricular tachycardia during exercise.  2D Echo Findings: The baseline ejection fraction was 60%. The peak ejection fraction at stress was 80%. Baseline regional wall motion abnormalities were not present.  Vina Gull MD Electronically signed on 10/24/2023 at 6:39:16 PM    Final (Updated)     Cardiac Studies   Patient Profile   Chris Obrien is a 73 y.o. male with no prior cardiac history who is being seen 10/24/2023 for the evaluation of VT during stress testing.   Assessment & Plan   1.Abnormal stress test - patient initially seen in ER 09/2023 with exetional SOB and dizziness. ER workup negative at the time for ACS, CT PE was negative. Discharged with plans for outpatient cards evaluation - on day of admit was having stress echo, one minute into exercise stress developed sustained VT per notes, self terminated. Was referred to ER by Dr Gull after review of stress results.  - trop 78-->82 - K 4.5, Mg 1.8. Mg was  replaced 2024 echo LVEF 55-60%, no WMAs, grade I dd, normal RV - stress echo: normal LVEF at baseline, no WMAs with exercise.  - started on  toprol 25mg  daily. Tele shows maintaing NSR, no recurrent arrhythmia  -plan for cath Monday   2.CKD - Cr 1.7, baseline 1.3 to 1.6. - gentle IVFs tomorrow prior to cath Monday   3.Rash - extensive workup he reports at [facility], multiple subspecialites have evaluated his chest rest - has been on mycophemolate at home 500mg  bid, will continue as inpatient.       For questions or updates, please contact [facility] Please consult [email or web address] for contact info under       Signed, Alvan Carrier, MD  10/25/2023, 10:18 AM

## 2023-10-26 ENCOUNTER — Ambulatory Visit: Payer: Self-pay | Admitting: Internal Medicine

## 2023-10-26 DIAGNOSIS — I472 Ventricular tachycardia, unspecified: Secondary | ICD-10-CM | POA: Diagnosis not present

## 2023-10-26 LAB — LIPID PANEL
Cholesterol: 110 mg/dL (ref 0–200)
HDL: 27 mg/dL — ABNORMAL LOW (ref >40)
LDL Cholesterol: 73 mg/dL (ref 0–99)
Total CHOL/HDL Ratio: 4.1 ratio
Triglycerides: 49 mg/dL (ref <150)
VLDL: 10 mg/dL (ref 0–40)

## 2023-10-26 MED ORDER — SODIUM CHLORIDE 0.9 % IV SOLN: INTRAVENOUS | Status: DC

## 2023-10-26 MED ORDER — METOPROLOL SUCCINATE ER 25 MG PO TB24
12.5000 mg | ORAL_TABLET | Freq: Once | ORAL | Status: AC
  Administered 2023-10-26: 12.5 mg via ORAL
  Filled 2023-10-26: qty 1

## 2023-10-26 MED ORDER — MELATONIN 5 MG PO TABS
10.0000 mg | ORAL_TABLET | Freq: Every evening | ORAL | Status: DC | PRN
  Administered 2023-10-26 – 2023-10-31 (×5): 10 mg via ORAL
  Filled 2023-10-26 (×5): qty 2

## 2023-10-26 MED ORDER — METOPROLOL SUCCINATE ER 25 MG PO TB24
37.5000 mg | ORAL_TABLET | Freq: Every day | ORAL | Status: DC
  Administered 2023-10-27 – 2023-10-28 (×2): 37.5 mg via ORAL
  Filled 2023-10-26 (×2): qty 2

## 2023-10-26 NOTE — Progress Notes (Signed)
 Confirmed with cardiologist, cardiac catheterization planned for Monday Oct. 20th. NPO orders to start 10/27/2026 at midnight. Pre-cath NS fluids to start 10/27/2023 at 0400.

## 2023-10-26 NOTE — Plan of Care (Signed)
  Problem: Clinical Measurements: Goal: Respiratory complications will improve Outcome: Progressing Goal: Cardiovascular complication will be avoided Outcome: Progressing   Problem: Skin Integrity: Goal: Risk for impaired skin integrity will decrease Outcome: Not Progressing

## 2023-10-26 NOTE — Progress Notes (Signed)
 Rounding Note   Patient Name: Chris Obrien Date of Encounter: 10/26/2023  [facility] Cardiologist: New  Subjective No complaints  Scheduled Meds:  aspirin  EC  81 mg Oral Daily   atorvastatin  80 mg Oral Daily   cholecalciferol  2,000 Units Oral Daily   enoxaparin  (LOVENOX ) injection  40 mg Subcutaneous Q24H   famotidine   20 mg Oral QHS   ferrous sulfate   325 mg Oral Q breakfast   metoprolol succinate  12.5 mg Oral Once   [START ON 10/27/2023] metoprolol succinate  37.5 mg Oral Daily   multivitamin  1 tablet Oral Q breakfast   multivitamin with minerals  1 tablet Oral Daily   mycophenolate  500 mg Oral BID   pantoprazole  40 mg Oral Daily   tamsulosin   0.8 mg Oral q AM   Continuous Infusions:  [START ON 10/27/2023] sodium chloride      PRN Meds: acetaminophen , melatonin, nitroGLYCERIN, ondansetron  (ZOFRAN ) IV   Vital Signs  Vitals:   10/25/23 1939 10/25/23 2353 10/26/23 0503 10/26/23 0814  BP: (!) 150/66 132/77 132/68 134/66  Pulse: 77 74 65 68  Resp:   16 18  Temp: 99.2 F (37.3 C) 99.2 F (37.3 C) 97.8 F (36.6 C) 98.4 F (36.9 C)  TempSrc: Oral Oral Oral Oral  SpO2: 96% 97% 97% 96%  Weight:      Height:        Intake/Output Summary (Last 24 hours) at 10/26/2023 1010 Last data filed at 10/25/2023 2100 Gross per 24 hour  Intake 240 ml  Output --  Net 240 ml      10/25/2023   12:59 PM 10/24/2023    8:26 PM 10/07/2023    3:04 PM  Last 3 Weights  Weight (lbs) 235 lb 6.4 oz 230 lb 232 lb  Weight (kg) 106.777 kg 104.327 kg 105.235 kg      Telemetry NSR, bigeminy, isolated 5 beat run NSVT - Personally Reviewed  ECG  N/a - Personally Reviewed  Physical Exam  GEN: No acute distress.   Neck: No JVD Cardiac: RRR, no murmurs, rubs, or gallops.  Respiratory: Clear to auscultation bilaterally. GI: Soft, nontender, non-distended  MS: No edema; No deformity. Neuro:  Nonfocal  Psych: Normal affect   Labs High Sensitivity Troponin:    Recent Labs  Lab 10/04/23 1624 10/04/23 1824 10/24/23 2026 10/24/23 2329  TROPONINIHS 53* 54* 78* 82*     Chemistry Recent Labs  Lab 10/24/23 2026 10/25/23 1355  NA 137  --   K 4.5  --   CL 104  --   CO2 23  --   GLUCOSE 136*  --   BUN 25*  --   CREATININE 1.70* 1.66*  CALCIUM  8.7*  --   MG 1.8  --   PROT 6.6  --   ALBUMIN 2.9*  --   AST 21  --   ALT 22  --   ALKPHOS 81  --   BILITOT 0.5  --   GFRNONAA 42* 43*  ANIONGAP 10  --     Lipids  Recent Labs  Lab 10/26/23 0436  CHOL 110  TRIG 49  HDL 27*  LDLCALC 73  CHOLHDL 4.1    Hematology Recent Labs  Lab 10/24/23 2026 10/25/23 1355  WBC 9.0 9.9  RBC 5.19 4.84  HGB 13.3 12.2*  HCT 41.8 38.7*  MCV 80.5 80.0  MCH 25.6* 25.2*  MCHC 31.8 31.5  RDW 16.9* 17.0*  PLT 471* 439*  Thyroid   Recent Labs  Lab 10/24/23 2023  TSH 0.865    BNP Recent Labs  Lab 10/24/23 2023  BNP 155.9*    DDimer No results for input(s): DDIMER in the last 168 hours.   Radiology  DG Chest Portable 1 View Result Date: 10/24/2023 EXAM: 1 VIEW(S) XRAY OF THE CHEST 10/24/2023 08:29:00 PM COMPARISON: Chest x-ray 09/02/2022, CT chest 10/04/2023. CLINICAL HISTORY: vtach. Reason for exam: Failed stress test today, vtach Failed stress test today, vtach. FINDINGS: LUNGS AND PLEURA: Low lung volumes. No focal pulmonary opacity. No pulmonary edema. No pleural effusion. No pneumothorax. HEART AND MEDIASTINUM: Unchanged  cardiac and mediastinal silhouettes. 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 No acute osseous abnormality. IMPRESSION: 1. No acute cardiopulmonary process. Electronically signed by: Morgane Naveau MD 10/24/2023 08:58 PM EDT RP Workstation: [id number]   ECHOCARDIOGRAM STRESS TEST Result Date: 10/24/2023     EXERCISE STRESS ECHO REPORT    -------------------------------------------------------------------------------- Patient Name:   Chris Obrien Date of Exam:  10/24/2023 Medical Rec #:  [id number]       Height:       73.0 in Accession #:    [id number]      Weight:       232.0 lb Date of Birth:  [birth year]       BSA:          2.292 m Patient Age:    73 years        BP:           148/77 mmHg Patient Gender: M               HR:           92 bpm. Exam Location:  [address] Procedure: Limited Echo, Cardiac Doppler, Limited Color Doppler and Intracardiac            Opacification Agent                           MODIFIED REPORT: This report was modified by Vina Gull MD on 10/24/2023 due to Amend.  Indications:     R06.09 Dyspnea  History:         Patient has prior history of Echocardiogram examinations, most                  recent 08/15/2022.  Sonographer:     Waldo Guadalajara RCS Referring Phys:  MELFORD KARLYNN GAILS PLOTNIKOV Diagnosing Phys: Vina Gull MD IMPRESSIONS  1. Echo showed normal LVEF at baseline After peak exercise, LVEF had hyperdynamic response with no new wall motion abnormalies.  2. Patient developed sustained VT that terminated spontaneously. Electrically nondiagnostic.  3. This is an inconclusive stress echocardiogram for ischemia, given VT. FINDINGS Exam Protocol: The patient exercised on a treadmill according to a Bruce protocol.  Patient Performance: The patient exercised for 1 minute and 17 seconds, achieving 2.9 METS. The baseline heart rate was 88 bpm. The heart rate at peak stress was 136 bpm. The target heart rate was calculated to be 123 bpm. The percentage of maximum predicted heart rate achieved was 94.0 %. The baseline blood pressure was 148/77 mmHg. The blood pressure at peak stress was 139/83 mmHg. The patient developed shortness of breath during the stress exam. The symptoms resolved with rest.  EKG: Resting EKG showed normal sinus rhythm with T wave inversion. The patient developed sustained ventricular tachycardia during exercise.  2D Echo Findings: The baseline  ejection fraction was 60%. The peak ejection fraction at stress was 80%. Baseline  regional wall motion abnormalities were not present.  Vina Gull MD Electronically signed on 10/24/2023 at 6:39:16 PM    Final (Updated)     Cardiac Studies   Patient Profile   Chris Obrien is a 73 y.o. male with no prior cardiac history who is being seen 10/24/2023 for the evaluation of VT during stress testing.     Assessment & Plan   1.Abnormal stress test/Exercised induced VT - patient initially seen in ER 09/2023 with exetional SOB and dizziness. ER workup negative at the time for ACS, CT PE was negative. Discharged with plans for outpatient cards evaluation - on day of admit was having stress echo, one minute into exercise stress developed sustained VT per notes, self terminated. Was referred to ER by Dr Gull after review of stress results.  - trop 78-->82 - K 4.5, Mg 1.8. Mg was replaced - 2024 echo LVEF 55-60%, no WMAs, grade I dd, normal RV - stress echo: normal LVEF at baseline, no WMAs with exercise. Will get more formal TTE.  - started on toprol 25mg  daily. Tele shows maintaing NSR, bigeminy, isolated 5 beat runs of NSVT. TItrate toprol to 37.5mg    -plan for cath Monday - if benign cath possible cMRI, EP evaluation  Informed Consent   Shared Decision Making/Informed Consent The risks [stroke (1 in 1000), death (1 in 1000), kidney failure [usually temporary] (1 in 500), bleeding (1 in 200), allergic reaction [possibly serious] (1 in 200)], benefits (diagnostic support and management of coronary artery disease) and alternatives of a cardiac catheterization were discussed in detail with Mr. Kilker and he is willing to proceed.         2.CKD - Cr 1.7, baseline 1.3 to 1.6. - gentle IVFs prior to cath Monday     3.Rash - extensive workup he reports at [facility], multiple subspecialites have evaluated his chest rash - has been on mycophemolate at home 500mg  bid, will continue as inpatient.        For questions or updates, please contact [facility] Please  consult [email or web address] for contact info under       Signed, Alvan Carrier, MD  10/26/2023, 10:10 AM

## 2023-10-27 ENCOUNTER — Encounter (HOSPITAL_COMMUNITY)
Admission: EM | Disposition: A | Discharge status: Home / Self Care | Payer: Self-pay | Source: Home / Self Care | Attending: Cardiology

## 2023-10-27 ENCOUNTER — Encounter: Payer: Self-pay | Admitting: Gastroenterology

## 2023-10-27 ENCOUNTER — Encounter (HOSPITAL_COMMUNITY): Payer: Self-pay | Admitting: Internal Medicine

## 2023-10-27 ENCOUNTER — Inpatient Hospital Stay (HOSPITAL_COMMUNITY)

## 2023-10-27 DIAGNOSIS — I25110 Atherosclerotic heart disease of native coronary artery with unstable angina pectoris: Secondary | ICD-10-CM | POA: Diagnosis not present

## 2023-10-27 DIAGNOSIS — R0602 Shortness of breath: Secondary | ICD-10-CM | POA: Diagnosis not present

## 2023-10-27 DIAGNOSIS — I472 Ventricular tachycardia, unspecified: Secondary | ICD-10-CM

## 2023-10-27 HISTORY — PX: LEFT HEART CATH AND CORONARY ANGIOGRAPHY: CATH118249

## 2023-10-27 HISTORY — PX: CORONARY PRESSURE/FFR STUDY: CATH118243

## 2023-10-27 LAB — MAGNESIUM: Magnesium: 1.8 mg/dL (ref 1.7–2.4)

## 2023-10-27 LAB — CBC
HCT: 38.1 % — ABNORMAL LOW (ref 39.0–52.0)
Hemoglobin: 12.1 g/dL — ABNORMAL LOW (ref 13.0–17.0)
MCH: 25.3 pg — ABNORMAL LOW (ref 26.0–34.0)
MCHC: 31.8 g/dL (ref 30.0–36.0)
MCV: 79.7 fL — ABNORMAL LOW (ref 80.0–100.0)
Platelets: 385 K/uL (ref 150–400)
RBC: 4.78 MIL/uL (ref 4.22–5.81)
RDW: 16.9 % — ABNORMAL HIGH (ref 11.5–15.5)
WBC: 6.6 K/uL (ref 4.0–10.5)
nRBC: 0 % (ref 0.0–0.2)

## 2023-10-27 LAB — ECHOCARDIOGRAM COMPLETE
Area-P 1/2: 2.42 cm2
Height: 73 in
S' Lateral: 3.4 cm
Weight: 3766.4 [oz_av]

## 2023-10-27 LAB — BASIC METABOLIC PANEL WITH GFR
Anion gap: 10 (ref 5–15)
BUN: 23 mg/dL (ref 8–23)
CO2: 22 mmol/L (ref 22–32)
Calcium: 8.3 mg/dL — ABNORMAL LOW (ref 8.9–10.3)
Chloride: 104 mmol/L (ref 98–111)
Creatinine, Ser: 1.6 mg/dL — ABNORMAL HIGH (ref 0.61–1.24)
GFR, Estimated: 45 mL/min — ABNORMAL LOW (ref >60)
Glucose, Bld: 100 mg/dL — ABNORMAL HIGH (ref 70–99)
Potassium: 4.4 mmol/L (ref 3.5–5.1)
Sodium: 136 mmol/L (ref 135–145)

## 2023-10-27 LAB — LIPOPROTEIN A (LPA): Lipoprotein (a): 95.3 nmol/L — ABNORMAL HIGH (ref <75.0)

## 2023-10-27 LAB — POCT ACTIVATED CLOTTING TIME: Activated Clotting Time: 251 s

## 2023-10-27 SURGERY — LEFT HEART CATH AND CORONARY ANGIOGRAPHY
Anesthesia: LOCAL

## 2023-10-27 MED ORDER — LIDOCAINE HCL (PF) 1 % IJ SOLN
INTRAMUSCULAR | Status: AC
  Filled 2023-10-27: qty 30

## 2023-10-27 MED ORDER — LIDOCAINE HCL (PF) 1 % IJ SOLN
INTRAMUSCULAR | Status: DC | PRN
  Administered 2023-10-27: 2 mL via INTRADERMAL

## 2023-10-27 MED ORDER — NITROGLYCERIN 1 MG/10 ML FOR IR/CATH LAB
INTRA_ARTERIAL | Status: DC | PRN
  Administered 2023-10-27: 200 ug via INTRACORONARY

## 2023-10-27 MED ORDER — FENTANYL CITRATE (PF) 100 MCG/2ML IJ SOLN
INTRAMUSCULAR | Status: DC | PRN
  Administered 2023-10-27: 25 ug via INTRAVENOUS

## 2023-10-27 MED ORDER — MIDAZOLAM HCL (PF) 2 MG/2ML IJ SOLN
INTRAMUSCULAR | Status: DC | PRN
  Administered 2023-10-27: 1 mg via INTRAVENOUS

## 2023-10-27 MED ORDER — MIDAZOLAM HCL 2 MG/2ML IJ SOLN
INTRAMUSCULAR | Status: AC
  Filled 2023-10-27: qty 2

## 2023-10-27 MED ORDER — FENTANYL CITRATE (PF) 100 MCG/2ML IJ SOLN
INTRAMUSCULAR | Status: AC
  Filled 2023-10-27: qty 2

## 2023-10-27 MED ORDER — HEPARIN SODIUM (PORCINE) 1000 UNIT/ML IJ SOLN
INTRAMUSCULAR | Status: AC
  Filled 2023-10-27: qty 10

## 2023-10-27 MED ORDER — PERFLUTREN LIPID MICROSPHERE
1.0000 mL | INTRAVENOUS | Status: AC | PRN
  Administered 2023-10-27: 2 mL via INTRAVENOUS

## 2023-10-27 MED ORDER — HEPARIN (PORCINE) IN NACL 1000-0.9 UT/500ML-% IV SOLN
INTRAVENOUS | Status: DC | PRN
  Administered 2023-10-27 (×2): 500 mL

## 2023-10-27 MED ORDER — HEPARIN SODIUM (PORCINE) 1000 UNIT/ML IJ SOLN
INTRAMUSCULAR | Status: DC | PRN
  Administered 2023-10-27: 6000 [IU] via INTRAVENOUS
  Administered 2023-10-27: 5000 [IU] via INTRAVENOUS

## 2023-10-27 MED ORDER — VERAPAMIL HCL 2.5 MG/ML IV SOLN
INTRAVENOUS | Status: AC
Start: 2023-10-27 — End: 2023-10-27
  Filled 2023-10-27: qty 2

## 2023-10-27 MED ORDER — NITROGLYCERIN 1 MG/10 ML FOR IR/CATH LAB
INTRA_ARTERIAL | Status: AC
  Filled 2023-10-27: qty 10

## 2023-10-27 MED ORDER — IOHEXOL 350 MG/ML SOLN
INTRAVENOUS | Status: DC | PRN
  Administered 2023-10-27: 80 mL

## 2023-10-27 MED ORDER — VERAPAMIL HCL 2.5 MG/ML IV SOLN
INTRAVENOUS | Status: DC | PRN
  Administered 2023-10-27: 10 mL via INTRA_ARTERIAL

## 2023-10-27 SURGICAL SUPPLY — 13 items
CATH 5FR JL3.5 JR4 ANG PIG MP (CATHETERS) IMPLANT
CATH LAUNCHER 5F AL1 (CATHETERS) IMPLANT
CATH LAUNCHER 6FR EBU 3.75 (CATHETERS) IMPLANT
DEVICE RAD COMP TR BAND LRG (VASCULAR PRODUCTS) IMPLANT
ELECT DEFIB PAD ADLT CADENCE (PAD) IMPLANT
GLIDESHEATH SLEND SS 6F .021 (SHEATH) IMPLANT
GUIDEWIRE INQWIRE 1.5J.035X260 (WIRE) IMPLANT
GUIDEWIRE PRESSURE X 175 (WIRE) IMPLANT
KIT ESSENTIALS PG (KITS) IMPLANT
KIT SYRINGE INJ CVI SPIKEX1 (MISCELLANEOUS) IMPLANT
PACK CARDIAC CATHETERIZATION (CUSTOM PROCEDURE TRAY) ×2 IMPLANT
SET ATX-X65L (MISCELLANEOUS) IMPLANT
WIRE HI TORQ VERSACORE-J 145CM (WIRE) IMPLANT

## 2023-10-27 NOTE — Progress Notes (Addendum)
  Progress Note  Patient Name: Chris Obrien Date of Encounter: 10/27/2023 [facility] Cardiologist: None   Interval Summary   Asymptomatic at rest.  Develops dyspnea with light exertion, but has not had angina. Episode of nonsustained 13-18 beat VT around 1600 hrs. yesterday, preceded and followed by persistent ventricular couplets.  Was not symptomatic.  Vital Signs Vitals:   10/26/23 0503 10/26/23 0814 10/26/23 1134 10/26/23 2039  BP: 132/68 134/66 116/62 137/65  Pulse: 65 68 67   Resp: 16 18 18 17   Temp: 97.8 F (36.6 C) 98.4 F (36.9 C) 98.1 F (36.7 C) 97.8 F (36.6 C)  TempSrc: Oral Oral Oral Oral  SpO2: 97% 96% 98% 97%  Weight:      Height:        Intake/Output Summary (Last 24 hours) at 10/27/2023 0748 Last data filed at 10/27/2023 0630 Gross per 24 hour  Intake 308.51 ml  Output --  Net 308.51 ml      10/25/2023   12:59 PM 10/24/2023    8:26 PM 10/07/2023    3:04 PM  Last 3 Weights  Weight (lbs) 235 lb 6.4 oz 230 lb 232 lb  Weight (kg) 106.777 kg 104.327 kg 105.235 kg      Telemetry/ECG  Very frequent PVCs and ventricular couplets and 1 episode of nonsustained VT consisting of  13 beat + 5 beat salvos- Personally Reviewed    Physical Exam  GEN: No acute distress.   Neck: No JVD Cardiac: Occasional ectopy on a background of RRR, no murmurs, rubs, or gallops.  Respiratory: Clear to auscultation bilaterally. GI: Soft, nontender, non-distended  MS: No edema  Assessment & Plan   73 year old man without structural cardiac abnormalities on echocardiogram, admitted for impaired exercise tolerance and sustained ventricular tachycardia developing at low exercise levels during stress echo.  He does not have angina pectoris or other ECG changes suggestive of ischemia.  He is asymptomatic at rest.  Previous CT imaging of the chest is remarkable for the absence of any calcification in the distribution of the coronary arteries, thoracic aorta or  its branches, minimal plaque is seen in the distal abdominal aorta.  For cardiac catheterization and coronary angiography today.  Will let him have breakfast since it appears the procedure will not be performed until mid to late afternoon.  If significant CAD is not identified he should undergo cardiac MRI and EP consultation.     For questions or updates, please contact  [facility] Please consult [email or web address] for contact info under         Signed, Jerel Balding, MD

## 2023-10-27 NOTE — TOC CM/SW Note (Signed)
 Transition of Care [facility]) - Inpatient Brief Assessment   Patient Details  Name: Chris Obrien MRN: [id number] Date of Birth: [birth year]  [facility] of [facility] CM/SW Contact:    Sudie Erminio Deems, RN Phone Number: 10/27/2023, 3:40 PM   Clinical Narrative: Patient presented for dizziness and dyspnea on exertion.-plan for LHC today. PTA patient was independent from home with spouse. Patient has insurance and PCP. No home needs identified at this time. ICM will continue to follow for additional needs as the patient progresses.     Transition of Care Asessment: Insurance and Status: Insurance coverage has been reviewed Patient has primary care physician: Yes Home environment has been reviewed: reviewed Prior level of function:: independent Prior/Current Home Services: No current home services Social Drivers of Health Review: SDOH reviewed no interventions necessary Readmission risk has been reviewed: Yes Transition of care needs: no transition of care needs at this time

## 2023-10-27 NOTE — H&P (View-Only) (Signed)
  Progress Note  Patient Name: Chris Obrien Date of Encounter: 10/27/2023 [facility] Cardiologist: None   Interval Summary   Asymptomatic at rest.  Develops dyspnea with light exertion, but has not had angina. Episode of nonsustained 13-18 beat VT around 1600 hrs. yesterday, preceded and followed by persistent ventricular couplets.  Was not symptomatic.  Vital Signs Vitals:   10/26/23 0503 10/26/23 0814 10/26/23 1134 10/26/23 2039  BP: 132/68 134/66 116/62 137/65  Pulse: 65 68 67   Resp: 16 18 18 17   Temp: 97.8 F (36.6 C) 98.4 F (36.9 C) 98.1 F (36.7 C) 97.8 F (36.6 C)  TempSrc: Oral Oral Oral Oral  SpO2: 97% 96% 98% 97%  Weight:      Height:        Intake/Output Summary (Last 24 hours) at 10/27/2023 0748 Last data filed at 10/27/2023 0630 Gross per 24 hour  Intake 308.51 ml  Output --  Net 308.51 ml      10/25/2023   12:59 PM 10/24/2023    8:26 PM 10/07/2023    3:04 PM  Last 3 Weights  Weight (lbs) 235 lb 6.4 oz 230 lb 232 lb  Weight (kg) 106.777 kg 104.327 kg 105.235 kg      Telemetry/ECG  Very frequent PVCs and ventricular couplets and 1 episode of nonsustained VT consisting of  13 beat + 5 beat salvos- Personally Reviewed    Physical Exam  GEN: No acute distress.   Neck: No JVD Cardiac: Occasional ectopy on a background of RRR, no murmurs, rubs, or gallops.  Respiratory: Clear to auscultation bilaterally. GI: Soft, nontender, non-distended  MS: No edema  Assessment & Plan   73 year old man without structural cardiac abnormalities on echocardiogram, admitted for impaired exercise tolerance and sustained ventricular tachycardia developing at low exercise levels during stress echo.  He does not have angina pectoris or other ECG changes suggestive of ischemia.  He is asymptomatic at rest.  Previous CT imaging of the chest is remarkable for the absence of any calcification in the distribution of the coronary arteries, thoracic aorta or  its branches, minimal plaque is seen in the distal abdominal aorta.  For cardiac catheterization and coronary angiography today.  Will let him have breakfast since it appears the procedure will not be performed until mid to late afternoon.  If significant CAD is not identified he should undergo cardiac MRI and EP consultation.     For questions or updates, please contact  [facility]Care Please consult [email or web address] for contact info under         Signed, Jerel Balding, MD

## 2023-10-27 NOTE — Plan of Care (Signed)
  Problem: Activity: Goal: Ability to tolerate increased activity will improve Outcome: Progressing   Problem: Cardiac: Goal: Ability to achieve and maintain adequate cardiovascular perfusion will improve Outcome: Progressing   Problem: Nutrition: Goal: Adequate nutrition will be maintained Outcome: Progressing   Problem: Coping: Goal: Level of anxiety will decrease Outcome: Progressing   Problem: Safety: Goal: Ability to remain free from injury will improve Outcome: Progressing   Problem: Activity: Goal: Ability to return to baseline activity level will improve Outcome: Progressing   Problem: Cardiovascular: Goal: Ability to achieve and maintain adequate cardiovascular perfusion will improve Outcome: Progressing Goal: Vascular access site(s) Level 0-1 will be maintained Outcome: Progressing

## 2023-10-27 NOTE — Interval H&P Note (Signed)
 History and Physical Interval Note:  10/27/2023 2:33 PM  Chris Obrien  has presented today for surgery, with the diagnosis of elevated troponin, NSVT, and abnormal stress test.  The various methods of treatment have been discussed with the patient and family. After consideration of risks, benefits and other options for treatment, the patient has consented to  Procedure(s): LEFT HEART CATH AND CORONARY ANGIOGRAPHY (N/A) as a surgical intervention.  The patient's history has been reviewed, patient examined, no change in status, stable for surgery.  I have reviewed the patient's chart and labs.  Questions were answered to the patient's satisfaction.    Cath Lab Visit (complete for each Cath Lab visit)  Clinical Evaluation Leading to the Procedure:   ACS: Yes.    Non-ACS:  N/A  Terris Germano

## 2023-10-28 ENCOUNTER — Inpatient Hospital Stay (HOSPITAL_COMMUNITY)

## 2023-10-28 DIAGNOSIS — I472 Ventricular tachycardia, unspecified: Secondary | ICD-10-CM | POA: Diagnosis not present

## 2023-10-28 LAB — BASIC METABOLIC PANEL WITH GFR
Anion gap: 11 (ref 5–15)
BUN: 24 mg/dL — ABNORMAL HIGH (ref 8–23)
CO2: 21 mmol/L — ABNORMAL LOW (ref 22–32)
Calcium: 8.1 mg/dL — ABNORMAL LOW (ref 8.9–10.3)
Chloride: 102 mmol/L (ref 98–111)
Creatinine, Ser: 1.69 mg/dL — ABNORMAL HIGH (ref 0.61–1.24)
GFR, Estimated: 42 mL/min — ABNORMAL LOW (ref >60)
Glucose, Bld: 92 mg/dL (ref 70–99)
Potassium: 4.8 mmol/L (ref 3.5–5.1)
Sodium: 134 mmol/L — ABNORMAL LOW (ref 135–145)

## 2023-10-28 MED ORDER — METOPROLOL SUCCINATE ER 25 MG PO TB24: 12.5000 mg | ORAL_TABLET | Freq: Every day | ORAL | Status: DC

## 2023-10-28 MED ORDER — DILTIAZEM HCL 30 MG PO TABS
30.0000 mg | ORAL_TABLET | Freq: Four times a day (QID) | ORAL | Status: DC
  Administered 2023-10-28 – 2023-10-29 (×2): 30 mg via ORAL
  Filled 2023-10-28: qty 1

## 2023-10-28 MED ORDER — DILTIAZEM HCL 30 MG PO TABS
30.0000 mg | ORAL_TABLET | Freq: Four times a day (QID) | ORAL | Status: DC
  Filled 2023-10-28 (×2): qty 1

## 2023-10-28 MED ORDER — ATORVASTATIN CALCIUM 40 MG PO TABS
40.0000 mg | ORAL_TABLET | Freq: Every day | ORAL | Status: DC
  Administered 2023-10-28 – 2023-11-01 (×5): 40 mg via ORAL
  Filled 2023-10-28 (×5): qty 1

## 2023-10-28 MED ORDER — GADOBUTROL 1 MMOL/ML IV SOLN
10.0000 mL | Freq: Once | INTRAVENOUS | Status: AC | PRN
  Administered 2023-10-28: 10 mL via INTRAVENOUS

## 2023-10-28 NOTE — Plan of Care (Signed)
  Problem: Activity: Goal: Ability to tolerate increased activity will improve Outcome: Progressing   Problem: Cardiac: Goal: Ability to achieve and maintain adequate cardiovascular perfusion will improve Outcome: Progressing   Problem: Clinical Measurements: Goal: Respiratory complications will improve Outcome: Progressing Goal: Cardiovascular complication will be avoided Outcome: Progressing   Problem: Activity: Goal: Risk for activity intolerance will decrease Outcome: Progressing   Problem: Nutrition: Goal: Adequate nutrition will be maintained Outcome: Progressing   Problem: Safety: Goal: Ability to remain free from injury will improve Outcome: Progressing   Problem: Activity: Goal: Ability to return to baseline activity level will improve Outcome: Progressing   Problem: Cardiovascular: Goal: Ability to achieve and maintain adequate cardiovascular perfusion will improve Outcome: Progressing Goal: Vascular access site(s) Level 0-1 will be maintained Outcome: Progressing

## 2023-10-28 NOTE — Progress Notes (Addendum)
 Progress Note  Patient Name: Chris Obrien Date of Encounter: 10/28/2023 [facility] Cardiologist: Jerel Balding, MD   Interval Summary   Feeling well without any complaints.  Has not had any episodes of chest pain or palpitations/dizziness during this admission.  He is anxious and wanting to be discharged soon as possible.  Did report feeling hot yesterday. Temp 100.1  Vital Signs Vitals:   10/27/23 2115 10/28/23 0047 10/28/23 0500 10/28/23 0839  BP: (!) 140/73 108/70 116/65 117/65  Pulse:  82 71 73  Resp: 18 18 18 17   Temp: 100.1 F (37.8 C) 99.1 F (37.3 C) 97.8 F (36.6 C) 99.2 F (37.3 C)  TempSrc: Oral Oral Oral Oral  SpO2: 96% 95% 97% 94%  Weight:      Height:        Intake/Output Summary (Last 24 hours) at 10/28/2023 0921 Last data filed at 10/27/2023 2130 Gross per 24 hour  Intake 240 ml  Output --  Net 240 ml      10/25/2023   12:59 PM 10/24/2023    8:26 PM 10/07/2023    3:04 PM  Last 3 Weights  Weight (lbs) 235 lb 6.4 oz 230 lb 232 lb  Weight (kg) 106.777 kg 104.327 kg 105.235 kg      Telemetry/ECG  Sinus rhythm.  At times in PVCs in a pattern of bigeminy sustained - Personally Reviewed  Physical Exam  GEN: No acute distress.   Neck: No JVD Cardiac: RRR, no murmurs, rubs, or gallops.  Respiratory: Clear to auscultation bilaterally. GI: Soft, nontender, non-distended  MS: No edema  Chest erythema with rash.  Cath site with no acute complications  Patient Profile Patient with past medical history significant for aneurysm of ascending aorta, GERD, history of infection of prosthetic shoulder joint, granulomatous dermatitis, OSA, GERD, BPH.  Patient had been seen in the ED.  Sent for stress testing when she had sustained episode of VT and admitted.  Assessment & Plan   Sustained VT Frequent PVCs, bigeminy During echo stress testing 10/17 had sustained episode of VT for approximately 30 seconds in which she felt dizzy and  lightheaded.  Reports episodes of going on for last 2 weeks.  No accompanying shortness of breath or chest pain.  Echocardiogram with preserved biventricular function.  Mild to moderately dilated LA.  No significant valvular disease.  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  Otherwise 50 to 60% ostial ramus normal filling pressures. disease with no clear etiology for his VT at this time.  He is going to have cardiac MRI for further evaluation of infiltrative disease/inflammatory process.    Of note he does have history of infection of prosthetic shoulder joint with rash on his chest that has been worked up at [facility]   Unclear if this is related but will consult/curbside infectious disease for an opinion.  CBC with differential on admission unrevealing. Cardiac MRI pending for today Continue with aspirin , atorvastatin 40 mg, Toprol-XL 37.5 mg. EP has been consulted and are aware. At discharge consider ICD if no obvious etiology is found or monitor if no ICD. No significant electrolyte derangements.  K greater than 4 magnesium  1.8. TSH normal  CKD Creatinine has been stable here.  Rash Continue mycophenolate 500 mg twice daily  Ascending aortic aneurysm Noted on CT.  4.2 cm.  Annual monitoring recommended.  Addendum: Spoke with infectious disease.  Concern for mosquito borne illnesses less likely given chronicity of rash and workup in the past.  His chronic shoulder infection also likely not contributory as he has not been chronically on antibiotics.  Lab work so far and lack of fever is not clearly correlated to anything.  We can reach back out as needed with any further questions.  For questions or updates, please contact [facility] Please consult [email or web address] for contact info under       Signed, Thom LITTIE Sluder, PA-C     Agree  with note by Thom Sluder, PA-C  Patient admitted with exercise-induced nonsustained ventricular tachycardia and the setting of a stress echo.  He is otherwise stable with minimal risk factors.  He apparently had COVID 14 months ago and over the last several months has noticed increasing fatigue and exercise intolerance as well as dizziness.  He also has had a rash of unclear etiology for several years as well as diffuse adenopathy all of which have been biopsied without his specific diagnosis.  His inflammatory markers are elevated as well.  He had a cardiac PET study done earlier this year that was negative for myocardial infiltration.  He did have a heart catheter revealed mild to moderate nonobstructive disease with DFR of the RCA and LAD which were negative.  He has normal LV systolic function.  He does have significant ectopy on telemetry.  He is scheduled for an cardiac MRI today.  EP is aware.  I suspect he will need formal EP evaluation plus or minus EP study, antiarrhythmic therapy, plus or minus ICD implantation for secondary prevention.  I have discussed this with Dr. Cindie.  I suspect his episodes of dizziness over the last several weeks or months have related to his nonsustained ventricular tachycardia.  Dorn DOROTHA Lesches, M.D., FACP, [facility], FAHA, [facility]  [address] [place], [place]  [address]  [phone] 10/28/2023 1:18 PM

## 2023-10-28 NOTE — Consult Note (Signed)
 ELECTROPHYSIOLOGY CONSULT NOTE    Patient ID: Chris Obrien MRN: [id number], DOB/AGE: Mar 15, 1950 73 y.o.  Admit date: 10/24/2023 Date of Consult: 10/28/2023  Primary Physician: Garald Karlynn GAILS, MD Primary Cardiologist: Jerel Balding, MD  Electrophysiologist: Dr. Cindie   Referring Provider: Dr. Court  Patient Profile: Chris Obrien is a 73 y.o. male with no prior cardiac history who is being seen today for the evaluation of VT at the request of Dr. Court.  HPI:  Chris Obrien is a 73 y.o. male who presented to the ED on 9/27 with exertional dizziness and shortness of breath. There were no acute findings in the ED and patient was subsequently referred to outpatient cardiology. He underwent treadmill stress test on 10/17 and developed sustained VT. While in VT, had symptoms consistent with recent dizziness/dyspnea. Upon termination of the test, ECG shows spontaneous resolution of VT. Patient then went home following test. Upon physician review of test, given VT, patient referred back to the ED. Patient started on ASA and beta blocker, admitted for further workup. On telemetry, episode of NSVT, ~18 beats at 1600 on 10/19. This was followed by persistent ventricular couplets. Patient underwent LHC on 10/27/23, found with mild to moderate non-obstructive disease, with 50% proximal LAD stenosis that was not hemodynamically significant by RFR, 0.91.  Otherwise 50 to 60% ostial ramus normal filling pressures. Ventricular bigeminy at 2100 on 10/20, duration ~29min.   EP asked to see patient today given no clear etiology of VT. According to patient, for about the last month, he has consistently developed dizziness/dyspnea with physical exertion that rapidly improves upon sitting down and resting. He also reports longstanding rash on chest that has not been clearly explained despite [facility] centers. Has also previously seen rheumatology. Patient noted with intermittent  low grade fever this admission.  He denies chest pain, PND, orthopnea, nausea, vomiting, edema, weight gain, or early satiety.   Labs Potassium4.8 (10/21 0348) Magnesium   1.8 (10/20 0359) Creatinine, ser  1.69* (10/21 0348) PLT  385 (10/20 0359) HGB  12.1* (10/20 0359) WBC 6.6 (10/20 0359)  .    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   Oligospermia    OSA on CPAP    PT LOST  WEIGHT - NO LONGER USING CPAP   Pain    RIGHT SHOULDER DELTOID MUSCLE ATROPHY - LOSS OF STRENGT VERY LIMITED RANGE OF MOTTION   Right knee skin infection    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   Sleep apnea    weight loss so no cpap now    Swelling of joint of right knee    Urticaria      Surgical History:  Past Surgical History:  Procedure Laterality Date   ADENOIDECTOMY     COLONOSCOPY     CORONARY PRESSURE/FFR STUDY N/A 10/27/2023   Procedure: CORONARY PRESSURE/FFR STUDY;  Surgeon: Mady Bruckner, MD;  Location: [facility]  INVASIVE CV LAB;  Service: Cardiovascular;  Laterality: N/A;   EXCISIONAL TOTAL KNEE ARTHROPLASTY WITH ANTIBIOTIC SPACERS Right 04/27/2012   Procedure: EXCISIONAL TOTAL KNEE ARTHROPLASTY WITH ANTIBIOTIC SPACERS;  Surgeon: Donnice JONETTA Car, MD;  Location: [facility];  Service: Orthopedics;  Laterality: Right;  SAME DAY LABS   FRACTURE SURGERY  12/2020   IRRIGATION AND DEBRIDEMENT KNEE Right 04/02/2012   Procedure: IRRIGATION AND  DEBRIDEMENT RIGHT PRE-PATELLA  ;  Surgeon: Donnice JONETTA Car, MD;  Location: [facility];  Service: Orthopedics;  Laterality: Right;   JOINT REPLACEMENT  [id number]   KNEE ARTHROSCOPY Right 08/2010   LEFT HEART CATH AND CORONARY ANGIOGRAPHY N/A 10/27/2023   Procedure: LEFT HEART CATH AND CORONARY ANGIOGRAPHY;  Surgeon: Mady Bruckner, MD;  Location: [facility] INVASIVE CV LAB;  Service: Cardiovascular;  Laterality: N/A;   POLYPECTOMY     SINOSCOPY     TONSILLECTOMY     TOTAL KNEE ARTHROPLASTY  01/28/2012   Procedure: TOTAL KNEE ARTHROPLASTY;  Surgeon: Donnice JONETTA Car, MD;  Location: [facility];  Service: Orthopedics;  Laterality: Right;   TOTAL KNEE ARTHROPLASTY Left 07/17/2015   Procedure: LEFT TOTAL KNEE ARTHROPLASTY;  Surgeon: Donnice Car, MD;  Location: [facility];  Service: Orthopedics;  Laterality: Left;   TOTAL KNEE REVISION Right 06/15/2012   Procedure: reimplantation of right total knee ;  Surgeon: Donnice JONETTA Car, MD;  Location: [facility];  Service: Orthopedics;  Laterality: Right;   TOTAL SHOULDER REPLACEMENT Bilateral    done in [facility]    ULNAR NERVE REPAIR Right 2008     Medications Prior to Admission  Medication Sig Dispense Refill Last Dose/Taking   B Complex-C-Folic Acid  (B COMPLEX-VITAMIN C-FOLIC ACID ) 1 MG tablet Take 1 tablet by mouth daily with breakfast. 100 tablet 3 10/24/2023 Morning   celecoxib  (CELEBREX ) 200 MG capsule Take 1-2 capsules (200-400 mg total) by mouth daily as needed. (Patient taking differently: Take 400 mg by mouth at bedtime.) 180 capsule 0 10/23/2023   Cholecalciferol (VITAMIN D3) 50 MCG (2000 UT) capsule Take 2 capsules (4,000 Units total) by mouth daily. (Patient taking differently: Take 2,000 Units by mouth daily.) 100 capsule 3 10/24/2023 Morning   famotidine  (PEPCID ) 20 MG tablet Take 20 mg by mouth at bedtime.   10/23/2023   ferrous sulfate  325 (65 FE) MG tablet Take 325 mg by mouth daily with breakfast.   10/24/2023 Morning   Melatonin 10-10 MG TBCR Take  10 mg by mouth at bedtime as needed (sleep issues). Bedtime   Past Week   Minoxidil (ROGAINE MENS) 5 % FOAM Apply 1 Application topically 4 (four) times a week.   10/24/2023 Morning   Multiple Vitamin (MULTI-VITAMIN) tablet Take 1 tablet by mouth daily. [employer] 50+   10/24/2023 Morning   mycophenolate (CELLCEPT) 500 MG tablet Take 500 mg by mouth 2 (two) times daily.   10/24/2023 Morning   omeprazole  (PRILOSEC) 40 MG capsule Take 1 capsule (40 mg total) by mouth 2 (two) times daily before a meal for 30 days, THEN 1 capsule (40 mg total) daily. 90 capsule 6 10/24/2023 Morning   tamsulosin  (FLOMAX ) 0.4 MG CAPS capsule Take 0.8 mg by mouth in the morning.   10/24/2023 Morning   testosterone  cypionate (DEPOTESTOSTERONE CYPIONATE) 200 MG/ML injection INJECT 0.5ML (100MG  TOTAL) INTO THE MUSCLE EVERY 7 DAYS - SINGLE USE VIAL - CONTENTS MUST BE DISCARDED AFTER FIRST USE 10 mL 1 10/19/2023   tirzepatide  (ZEPBOUND ) 2.5 MG/0.5ML injection vial Inject 2.5 mg into the skin once a  week. 2 mL 5 10/23/2023    Inpatient Medications:   aspirin  EC  81 mg Oral Daily   atorvastatin  40 mg Oral Daily   metoprolol succinate  37.5 mg Oral Daily   mycophenolate  500 mg Oral BID    Allergies:  Allergies  Allergen Reactions   Amoxicillin  Hives   Quinolones Other (See Comments)    Ascending thoracic aortic aneurysm    Vancomycin  Rash    Severe redness all over body with swollen ankles and hands   Lactose Intolerance (Gi) Other (See Comments)    Red on skin   Oxycodone  Rash   Penicillins Itching and Rash   Tramadol  Itching    2019 Shoulder post-op    Family History  Problem Relation Age of Onset   Alzheimer's disease Mother    Arthritis Father    Diabetes Brother    Hyperlipidemia Other    Colon cancer Neg Hx    Colon polyps Neg Hx    Esophageal cancer Neg Hx    Stomach cancer Neg Hx    Rectal cancer Neg Hx      Physical Exam: Vitals:   10/27/23 2115 10/28/23 0047 10/28/23 0500 10/28/23 0839   BP: (!) 140/73 108/70 116/65 117/65  Pulse:  82 71 73  Resp: 18 18 18 17   Temp: 100.1 F (37.8 C) 99.1 F (37.3 C) 97.8 F (36.6 C) 99.2 F (37.3 C)  TempSrc: Oral Oral Oral Oral  SpO2: 96% 95% 97% 94%  Weight:      Height:        GEN- NAD, A&O x 3, normal affect HEENT: Normocephalic, atraumatic Lungs- CTAB, Normal effort.  Heart- Regular rate and rhythm, No M/G/R.  GI- Soft, NT, ND.  Skin- chest erythema with prominent rash Extremities- No clubbing, cyanosis, or edema   Radiology/Studies: CARDIAC CATHETERIZATION Result Date: 10/27/2023 Conclusions: Mild-moderate, nonobstructive coronary artery disease, as detailed below, including 50% proximal LAD stenosis that is not hemodynamically significant (RFR = 0.91), 50-60% ostial ramus intermedius lesion, and mild luminal irregularities in the LCx and RCA. Normal left ventricular filling pressure (LVEDP 8 mmHg). Recommendations: Continue medical therapy and risk factor modification to prevent progression of disease. Proceed with cardiac MRI and electrophysiology consultation. Gentle post-catheterization hydration in the setting of moderate CKD. Lonni Hanson, MD [facility]  ECHOCARDIOGRAM COMPLETE Result Date: 10/27/2023    ECHOCARDIOGRAM REPORT   Patient Name:   Chris Obrien Date of Exam: 10/27/2023 Medical Rec #:  [id number]       Height:       73.0 in Accession #:    [id number]      Weight:       235.4 lb Date of Birth:  Jul 07, 1950       BSA:          2.306 m Patient Age:    73 years        BP:           127/74 mmHg Patient Gender: M               HR:           74 bpm. Exam Location:  Inpatient Procedure: 2D Echo and Intracardiac Opacification Agent (Both Spectral and Color            Flow Doppler were utilized during procedure). Indications:    Ventricular Tachycardia  History:        Patient has prior history of Echocardiogram examinations.  Sonographer:  Charmaine Gaskins Referring Phys: [id number] DORN FALCON BRANCH IMPRESSIONS  1.  Left ventricular ejection fraction, by estimation, is 65 to 70%. The left ventricle has normal function. The left ventricle has no regional wall motion abnormalities. Left ventricular diastolic parameters are consistent with Grade I diastolic dysfunction (impaired relaxation).  2. Right ventricular systolic function is normal. The right ventricular size is normal.  3. Left atrial size was mild to moderately dilated.  4. The mitral valve is grossly normal. No evidence of mitral valve regurgitation. No evidence of mitral stenosis.  5. The aortic valve was not well visualized. Unable to determine aortic valve morphology due to image quality. Aortic valve regurgitation is not visualized. Aortic valve sclerosis is present, with no evidence of aortic valve stenosis.  6. Both the aortic root and ascending aorta are normal when indexed to BSA, age and sex.  7. The inferior vena cava is normal in size with greater than 50% respiratory variability, suggesting right atrial pressure of 3 mmHg. Comparison(s): A prior study was performed on 09/04/2022. The ejection fraction was estimated at 55-60% with grade I diastolic dysfunction. The right atrium was mildly dilated. The RVSP was 28 mmHg. the aortic root was 40 mm. FINDINGS  Left Ventricle: Left ventricular ejection fraction, by estimation, is 65 to 70%. The left ventricle has normal function. The left ventricle has no regional wall motion abnormalities. Definity contrast agent was given IV to delineate the left ventricular  endocardial borders. The left ventricular internal cavity size was normal in size. There is no left ventricular hypertrophy. Left ventricular diastolic parameters are consistent with Grade I diastolic dysfunction (impaired relaxation). Right Ventricle: The right ventricular size is normal. No increase in right ventricular wall thickness. Right ventricular systolic function is normal. Left Atrium: Left atrial size was mild to moderately dilated. Right  Atrium: Right atrial size was normal in size. Pericardium: There is no evidence of pericardial effusion. Mitral Valve: The mitral valve is grossly normal. There is mild calcification of the mitral valve leaflet(s). No evidence of mitral valve regurgitation. No evidence of mitral valve stenosis. Tricuspid Valve: The tricuspid valve is normal in structure. Tricuspid valve regurgitation is not demonstrated. No evidence of tricuspid stenosis. Aortic Valve: The aortic valve was not well visualized. Aortic valve regurgitation is not visualized. Aortic valve sclerosis is present, with no evidence of aortic valve stenosis. Pulmonic Valve: The pulmonic valve was normal in structure. Pulmonic valve regurgitation is not visualized. No evidence of pulmonic stenosis. Aorta: Both the aortic root and ascending aorta are normal when indexed to BSA, age and sex. The aortic root and ascending aorta are structurally normal, with no evidence of dilitation. Venous: The inferior vena cava is normal in size with greater than 50% respiratory variability, suggesting right atrial pressure of 3 mmHg. IAS/Shunts: No atrial level shunt detected by color flow Doppler.  LEFT VENTRICLE PLAX 2D LVIDd:         5.10 cm   Diastology LVIDs:         3.40 cm   LV e' medial:    6.99 cm/s LV PW:         1.00 cm   LV E/e' medial:  7.2 LV IVS:        1.00 cm   LV e' lateral:   5.91 cm/s LVOT diam:     2.10 cm   LV E/e' lateral: 8.5 LVOT Area:     3.46 cm  RIGHT VENTRICLE RV Basal diam:  2.50 cm RV Mid  diam:    2.50 cm RV S prime:     14.10 cm/s LEFT ATRIUM             Index        RIGHT ATRIUM           Index LA diam:        3.90 cm 1.69 cm/m   RA Area:     14.60 cm LA Vol (A2C):   75.0 ml 32.52 ml/m  RA Volume:   32.10 ml  13.92 ml/m LA Vol (A4C):   71.8 ml 31.14 ml/m LA Biplane Vol: 77.3 ml 33.52 ml/m   AORTA Ao Root diam: 3.70 cm Ao Asc diam:  3.90 cm MITRAL VALVE MV Area (PHT): 2.42 cm    SHUNTS MV Decel Time: 314 msec    Systemic Diam: 2.10 cm  MV E velocity: 50.00 cm/s MV A velocity: 88.30 cm/s MV E/A ratio:  0.57 Emeline Calender Electronically signed by Emeline Calender Signature Date/Time: 10/27/2023/2:54:30 PM    Final    DG Chest Portable 1 View Result Date: 10/24/2023 EXAM: 1 VIEW(S) XRAY OF THE CHEST 10/24/2023 08:29:00 PM COMPARISON: Chest x-ray 09/02/2022, CT chest 10/04/2023. CLINICAL HISTORY: vtach. Reason for exam: Failed stress test today, vtach Failed stress test today, vtach. FINDINGS: LUNGS AND PLEURA: Low lung volumes. No focal pulmonary opacity. No pulmonary edema. No pleural effusion. No pneumothorax. HEART AND MEDIASTINUM: Unchanged  cardiac and mediastinal silhouettes. 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 No acute osseous abnormality. IMPRESSION: 1. No acute cardiopulmonary process. Electronically signed by: Morgane Naveau MD 10/24/2023 08:58 PM EDT RP Workstation: [id number]   ECHOCARDIOGRAM STRESS TEST Result Date: 10/24/2023     EXERCISE STRESS ECHO REPORT    -------------------------------------------------------------------------------- Patient Name:   Chris Obrien Date of Exam: 10/24/2023 Medical Rec #:  [id number]       Height:       73.0 in Accession #:    [id number]      Weight:       232.0 lb Date of Birth:  1950-09-08       BSA:          2.292 m Patient Age:    73 years        BP:           148/77 mmHg Patient Gender: M               HR:           92 bpm. Exam Location:  [address] Procedure: Limited Echo, Cardiac Doppler, Limited Color Doppler and Intracardiac            Opacification Agent                           MODIFIED REPORT: This report was modified by Vina Gull MD on 10/24/2023 due to Amend.  Indications:     R06.09 Dyspnea  History:         Patient has prior history of Echocardiogram examinations, most                  recent 08/15/2022.  Sonographer:     Waldo Guadalajara RCS Referring Phys:  MELFORD KARLYNN GAILS PLOTNIKOV Diagnosing Phys: Vina Gull MD  IMPRESSIONS  1. Echo showed normal LVEF at baseline After peak exercise, LVEF had hyperdynamic response with no new wall motion abnormalies.  2. Patient developed sustained VT that terminated spontaneously.  Electrically nondiagnostic.  3. This is an inconclusive stress echocardiogram for ischemia, given VT. FINDINGS Exam Protocol: The patient exercised on a treadmill according to a Bruce protocol.  Patient Performance: The patient exercised for 1 minute and 17 seconds, achieving 2.9 METS. The baseline heart rate was 88 bpm. The heart rate at peak stress was 136 bpm. The target heart rate was calculated to be 123 bpm. The percentage of maximum predicted heart rate achieved was 94.0 %. The baseline blood pressure was 148/77 mmHg. The blood pressure at peak stress was 139/83 mmHg. The patient developed shortness of breath during the stress exam. The symptoms resolved with rest.  EKG: Resting EKG showed normal sinus rhythm with T wave inversion. The patient developed sustained ventricular tachycardia during exercise.  2D Echo Findings: The baseline ejection fraction was 60%. The peak ejection fraction at stress was 80%. Baseline regional wall motion abnormalities were not present.  Vina Gull MD Electronically signed on 10/24/2023 at 6:39:16 PM    Final (Updated)    CT Angio Chest PE W and/or Wo Contrast Result Date: 10/04/2023 CLINICAL DATA:  Clinical concern for pulmonary embolism. EXAM: CT ANGIOGRAPHY CHEST WITH CONTRAST TECHNIQUE: Multidetector CT imaging of the chest was performed using the standard protocol during bolus administration of intravenous contrast. Multiplanar CT image reconstructions and MIPs were obtained to evaluate the vascular anatomy. RADIATION DOSE REDUCTION: This exam was performed according to the departmental dose-optimization program which includes automated exposure control, adjustment of the mA and/or kV according to patient size and/or use of iterative reconstruction technique.  CONTRAST:  75mL OMNIPAQUE  IOHEXOL  350 MG/ML SOLN COMPARISON:  09/05/2022. FINDINGS: Cardiovascular: The heart is mildly enlarged in there is no pericardial effusion. There is mild atherosclerotic calcification of the aorta with aneurysmal dilatation of the ascending aorta measuring 4.2 cm. The pulmonary trunk is mildly distended suggesting underlying pulmonary artery hypertension. No pulmonary embolism is seen. Mediastinum/Nodes: No mediastinal or hilar lymphadenopathy bilaterally. Enlarged lymph nodes are seen in the axillary regions bilaterally. The thyroid  gland and trachea are within normal limits. There is a small hiatal hernia. Lungs/Pleura: Mild atelectasis is seen bilaterally. No consolidation, effusion, or pneumothorax. There is a stable 5 mm nodule in the right middle lobe, axial image 53. Upper Abdomen: Fatty infiltration of the liver is noted. No acute abnormality. Musculoskeletal: Arthroplasty changes are noted at the shoulders bilaterally. Degenerative changes are present in the thoracic spine. No acute osseous abnormality. Review of the MIP images confirms the above findings. IMPRESSION: 1. No evidence of pulmonary embolism or other acute process. 2. Bilateral axillary lymphadenopathy the, not significantly changed from the prior exam. 3. 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 Recommend annual imaging followup by CTA or MRA. This recommendation follows 2010 ACCF/AHA/AATS/ACR/ASA/SCA/SCAI/SIR/STS/SVM Guidelines for the Diagnosis and Management of Patients with Thoracic Aortic Disease. Circulation. 2010; 121: Z733-z630. Aortic aneurysm NOS (ICD10-I71.9) 4. Hepatic steatosis. Electronically Signed   By: Leita Birmingham M.D.   On: 10/04/2023 17:51   CT HEAD WO CONTRAST Result Date: 10/04/2023 EXAM: CT HEAD WITHOUT CONTRAST 10/04/2023 05:14:41 PM TECHNIQUE: CT of the head was performed without the administration of intravenous contrast. Automated  exposure control, iterative reconstruction, and/or weight based adjustment of the mA/kV was utilized to reduce the radiation dose to as low as reasonably achievable. COMPARISON: CT head without contrast 10/06/2012. CLINICAL HISTORY: Syncope/presyncope, cerebrovascular cause suspected. FINDINGS: BRAIN AND VENTRICLES: Atherosclerotic calcifications are present in the cavernous carotid arteries bilaterally. No hyperdense vessel is present. No  acute hemorrhage. No evidence of acute infarct. No hydrocephalus. No extra-axial collection. No mass effect or midline shift. ORBITS: No acute abnormality. SINUSES: Chronic opacification of the right maxillary sinus is noted. Minimal fluid is present in the right sphenoid sinus. SOFT TISSUES AND SKULL: No acute soft tissue abnormality. No skull fracture. IMPRESSION: 1. No acute intracranial abnormality related to the clinical history of syncope/presyncope. Electronically signed by: Lonni Necessary MD 10/04/2023 05:21 PM EDT RP Workstation: [id number]    ZXH:Fndu recent ECG 10/20 reviewed. Sinus rhythm at 71bpm, no focal abnormalities. (personally reviewed)  TELEMETRY: sinus rhythm. ~1 min bigeminy yesterday at 2100. Isolated PVCs since.  (personally reviewed)  DEVICE HISTORY: n/a  Assessment/Plan:  Ventricular tachycardia Patient with VT induced on recent exercise stress test, symptomatic with dizziness and dyspnea. These symptoms fit recent pattern with exertion. 12 lead ECG indicates apical septal origin for these PVCs. With lack of hemodynamically significant CAD, normal EF, and onset with exertion, consistent with automatic VT. Clinical presentation suspicious for acute inflammatory process. Currently not an ICD candidate given acutely inflamed rash across chest (infection risk). Pending cMRI will help clarify indication as well.  Update ESR and CRP with morning labs.  Will adjust suppression medications, add Diltiazem and plan to titrate up as able. Will  slowly decrease Toprol XL as Diltiazem dose increases.   CKD Stable renal labs this admission.      For questions or updates, please contact [facility] Please consult [email or web address] for contact info under     Signed, Artist Pouch, PA-C  10/28/2023, 10:02 AM

## 2023-10-29 DIAGNOSIS — N189 Chronic kidney disease, unspecified: Secondary | ICD-10-CM

## 2023-10-29 DIAGNOSIS — I472 Ventricular tachycardia, unspecified: Secondary | ICD-10-CM | POA: Diagnosis not present

## 2023-10-29 LAB — BASIC METABOLIC PANEL WITH GFR
Anion gap: 10 (ref 5–15)
BUN: 27 mg/dL — ABNORMAL HIGH (ref 8–23)
CO2: 26 mmol/L (ref 22–32)
Calcium: 8.4 mg/dL — ABNORMAL LOW (ref 8.9–10.3)
Chloride: 101 mmol/L (ref 98–111)
Creatinine, Ser: 1.87 mg/dL — ABNORMAL HIGH (ref 0.61–1.24)
GFR, Estimated: 37 mL/min — ABNORMAL LOW (ref >60)
Glucose, Bld: 97 mg/dL (ref 70–99)
Potassium: 4.5 mmol/L (ref 3.5–5.1)
Sodium: 137 mmol/L (ref 135–145)

## 2023-10-29 LAB — CBC
HCT: 36.9 % — ABNORMAL LOW (ref 39.0–52.0)
Hemoglobin: 11.8 g/dL — ABNORMAL LOW (ref 13.0–17.0)
MCH: 25.5 pg — ABNORMAL LOW (ref 26.0–34.0)
MCHC: 32 g/dL (ref 30.0–36.0)
MCV: 79.9 fL — ABNORMAL LOW (ref 80.0–100.0)
Platelets: 400 K/uL (ref 150–400)
RBC: 4.62 MIL/uL (ref 4.22–5.81)
RDW: 16.6 % — ABNORMAL HIGH (ref 11.5–15.5)
WBC: 6.3 K/uL (ref 4.0–10.5)
nRBC: 0 % (ref 0.0–0.2)

## 2023-10-29 LAB — SEDIMENTATION RATE: Sed Rate: 48 mm/h — ABNORMAL HIGH (ref 0–16)

## 2023-10-29 LAB — C-REACTIVE PROTEIN: CRP: 11 mg/dL — ABNORMAL HIGH (ref <1.0)

## 2023-10-29 MED ORDER — FERROUS SULFATE 325 (65 FE) MG PO TABS
325.0000 mg | ORAL_TABLET | Freq: Every day | ORAL | Status: DC
Start: 2023-10-29 — End: 2023-11-01
  Administered 2023-10-29 – 2023-11-01 (×4): 325 mg via ORAL
  Filled 2023-10-29 (×4): qty 1

## 2023-10-29 MED ORDER — FAMOTIDINE 20 MG PO TABS
20.0000 mg | ORAL_TABLET | Freq: Every day | ORAL | Status: DC
Start: 2023-10-29 — End: 2023-11-01
  Administered 2023-10-29 – 2023-10-31 (×3): 20 mg via ORAL
  Filled 2023-10-29 (×3): qty 1

## 2023-10-29 MED ORDER — SODIUM CHLORIDE 0.9 % IV SOLN: INTRAVENOUS | Status: AC

## 2023-10-29 MED ORDER — DILTIAZEM HCL ER COATED BEADS 120 MG PO CP24
120.0000 mg | ORAL_CAPSULE | Freq: Two times a day (BID) | ORAL | Status: DC
  Administered 2023-10-29 – 2023-11-01 (×7): 120 mg via ORAL
  Filled 2023-10-29 (×7): qty 1

## 2023-10-29 MED ORDER — METOPROLOL SUCCINATE ER 25 MG PO TB24
25.0000 mg | ORAL_TABLET | Freq: Two times a day (BID) | ORAL | Status: DC
  Administered 2023-10-29 – 2023-10-30 (×3): 25 mg via ORAL
  Filled 2023-10-29 (×3): qty 1

## 2023-10-29 MED ORDER — VITAMIN D 25 MCG (1000 UNIT) PO TABS
2000.0000 [IU] | ORAL_TABLET | Freq: Every day | ORAL | Status: DC
  Administered 2023-10-29 – 2023-11-01 (×4): 2000 [IU] via ORAL
  Filled 2023-10-29 (×4): qty 2

## 2023-10-29 MED ORDER — B COMPLEX-C PO TABS
1.0000 | ORAL_TABLET | Freq: Every day | ORAL | Status: DC
  Administered 2023-10-29 – 2023-11-01 (×4): 1 via ORAL
  Filled 2023-10-29 (×4): qty 1

## 2023-10-29 MED ORDER — TAMSULOSIN HCL 0.4 MG PO CAPS
0.8000 mg | ORAL_CAPSULE | Freq: Every day | ORAL | Status: DC
  Administered 2023-10-29 – 2023-11-01 (×4): 0.8 mg via ORAL
  Filled 2023-10-29 (×4): qty 2

## 2023-10-29 MED ORDER — ADULT MULTIVITAMIN W/MINERALS CH
1.0000 | ORAL_TABLET | Freq: Every day | ORAL | Status: DC
  Administered 2023-10-29 – 2023-11-01 (×4): 1 via ORAL
  Filled 2023-10-29 (×4): qty 1

## 2023-10-29 MED ORDER — METOPROLOL SUCCINATE ER 50 MG PO TB24: 50.0000 mg | ORAL_TABLET | Freq: Two times a day (BID) | ORAL | Status: DC

## 2023-10-29 NOTE — Plan of Care (Signed)
  Problem: Cardiac: Goal: Ability to achieve and maintain adequate cardiovascular perfusion will improve Outcome: Progressing   Problem: Health Behavior/Discharge Planning: Goal: Ability to manage health-related needs will improve Outcome: Progressing   Problem: Clinical Measurements: Goal: Will remain free from infection Outcome: Progressing

## 2023-10-29 NOTE — Progress Notes (Addendum)
 Progress Note  Patient Name: Chris Obrien Date of Encounter: 10/29/2023 [facility] Cardiologist: Jerel Balding, MD   Interval Summary    No complaints this morning, hopeful to DC home soon  Vital Signs Vitals:   10/28/23 1303 10/28/23 1628 10/28/23 2008 10/29/23 0556  BP: 119/65 122/71 123/70 126/82  Pulse: 67 66 65 65  Resp: 17 18 18 18   Temp: 98.6 F (37 C) 98.4 F (36.9 C) 99.2 F (37.3 C) 97.7 F (36.5 C)  TempSrc: Oral Oral Oral Oral  SpO2: 93% 99% 100% 93%  Weight:      Height:       No intake or output data in the 24 hours ending 10/29/23 0736    10/25/2023   12:59 PM 10/24/2023    8:26 PM 10/07/2023    3:04 PM  Last 3 Weights  Weight (lbs) 235 lb 6.4 oz 230 lb 232 lb  Weight (kg) 106.777 kg 104.327 kg 105.235 kg      Telemetry/ECG   Sinus Rhythm rates 60 - Personally Reviewed  Physical Exam  GEN: No acute distress.   Neck: No JVD Cardiac: RRR, no murmurs, rubs, or gallops.  Respiratory: Clear to auscultation bilaterally. GI: Soft, nontender, non-distended  MS: No edema Skin: inflamed, erythematous rash to anterior chest wall   Assessment & Plan   Patient with past medical history significant for aneurysm of ascending aorta, GERD, history of infection of prosthetic shoulder joint, granulomatous dermatitis, OSA, GERD, BPH. Patient had been seen in the ED.  Sent for stress testing when he had sustained episode of VT and admitted.  VT -- Underwent echo stress 10/17 developed episode of sustained VT for approximately 30 seconds. -- Echo showed preserved biventricular function, mild to moderate LA dilated LA with no significant valvular disease -- Cardiac catheterization in 10/20 with mild to moderate nonobstructive disease of 50% proximal LAD stenosis which was not hemodynamically significant by RFR of 0.91, 50 to 60% ostial ramus with normal filling pressures. -- Cardiac MRI completed with results pending -- Continue Toprol XL 25 mg  twice daily, Cardizem CD 120 mg daily -- EP following   Ascending aortic aneurysm -- Noted on CT, 4.2 cm  Granulomatous dermatitis -- Extensive workup done at [facility] -- On CellCept 500 mg twice daily  CKD -- Baseline creatinine 1.3-1.6, up to 1.87 -- give gentle IVFs this morning   Nonobstructive CAD HLD -- on ASA, statin     For questions or updates, please contact [facility] Please consult [email or web address] for contact info under    Signed, Manuelita Rummer, NP    Pt seen and examined   I agree with findings as noted by L Rummer above  I saw the pt's stress echo last week and recomm admission for VT Cath with mild to mod CAD    MRI done       Will review  Currently on Toprol XL and Cardiazem CD  Lungs CTA Cardiac RRR  No S3  No murmurs Abd benign Ext without edema Skin   Marked erythem with some bullous lesions on chest      Pt has had for awhile (followed at [facility] derm)  Impression VT   No evid of ischemia as cause    MRI done, not read Now on B blocker and CA blocker Plan for GXT tomorrow  Will need follow up of aorta over time  Mod CAD   Rx ASA and statin  Rash=  Cause unknown  Follow at [facility] MD

## 2023-10-29 NOTE — Progress Notes (Signed)
  Patient Name: Chris Obrien Date of Encounter: 10/29/2023  Primary Cardiologist: Jerel Balding, MD Electrophysiologist: None  Interval Summary   The patient is doing well today.  At this time, the patient denies chest pain, shortness of breath, or any new concerns. Expresses frustration with duration of admission. Patient essentially remained in bed all day yesterday per nursing instructions given his activity associated VT.  Vital Signs    Vitals:   10/28/23 1303 10/28/23 1628 10/28/23 2008 10/29/23 0556  BP: 119/65 122/71 123/70 126/82  Pulse: 67 66 65 65  Resp: 17 18 18 18   Temp: 98.6 F (37 C) 98.4 F (36.9 C) 99.2 F (37.3 C) 97.7 F (36.5 C)  TempSrc: Oral Oral Oral Oral  SpO2: 93% 99% 100% 93%  Weight:      Height:       No intake or output data in the 24 hours ending 10/29/23 9347 Filed Weights   10/24/23 2026 10/25/23 1259  Weight: 104.3 kg 106.8 kg    Physical Exam    GEN- NAD, Alert and oriented  Lungs- Clear to ausculation bilaterally, normal work of breathing Cardiac- Regular rate and rhythm, no murmurs, rubs or gallops GI- soft, NT, ND, + BS Extremities- no clubbing or cyanosis. No edema  Telemetry    Sinus rhythm, no recurrent VT. No PVCs (personally reviewed)  Hospital Course    KAISEI GILBO is a 73 y.o. male with no prior cardiac history who is being seen today for the evaluation of VT at the request of Dr. Court.   Assessment & Plan    Ventricular tachycardia Patient with VT induced on recent exercise stress test, recreating dizziness and dyspnea that patient has been experiencing with exertion. 12 lead ECG indicates apical septal origin for these PVCs. With lack of hemodynamically significant CAD, normal EF, and onset with exertion, consistent with automatic VT.  Clinical suspicion for acute on chronic inflammatory process remains high. S/p MRI (read still pending). ESR 48. Follow MRI results today Follow CRP Continue to up-titrate  Diltiazem (transition to Cardizem CD 120mg  BID). Changing Toprol XL to 25mg  BID (adjust dose up as able with increased Cardizem).  Will encourage some cautious ambulation today, plan for limited treadmill test tomorrow to assess medication efficacy.  CKD Small bump in creatinine from yesterday, 1.69->1.87. Continue to monitor.       For questions or updates, please contact [facility] Please consult [email or web address] for contact info under     Signed, Artist Pouch, PA-C  10/29/2023, 6:52 AM

## 2023-10-29 NOTE — Care Management Important Message (Signed)
 Important Message  Patient Details  Name: NIAM NEPOMUCENO MRN: [id number] Date of Birth: 04-Aug-1950   Important Message Given:  Yes - Medicare IM     Vonzell Arrie Sharps 10/29/2023, 9:48 AM

## 2023-10-30 ENCOUNTER — Other Ambulatory Visit

## 2023-10-30 DIAGNOSIS — R911 Solitary pulmonary nodule: Secondary | ICD-10-CM | POA: Diagnosis not present

## 2023-10-30 DIAGNOSIS — I472 Ventricular tachycardia, unspecified: Secondary | ICD-10-CM | POA: Diagnosis not present

## 2023-10-30 DIAGNOSIS — I429 Cardiomyopathy, unspecified: Secondary | ICD-10-CM

## 2023-10-30 LAB — CBC
HCT: 36.6 % — ABNORMAL LOW (ref 39.0–52.0)
Hemoglobin: 11.6 g/dL — ABNORMAL LOW (ref 13.0–17.0)
MCH: 25.1 pg — ABNORMAL LOW (ref 26.0–34.0)
MCHC: 31.7 g/dL (ref 30.0–36.0)
MCV: 79.2 fL — ABNORMAL LOW (ref 80.0–100.0)
Platelets: 417 K/uL — ABNORMAL HIGH (ref 150–400)
RBC: 4.62 MIL/uL (ref 4.22–5.81)
RDW: 16.5 % — ABNORMAL HIGH (ref 11.5–15.5)
WBC: 6.7 K/uL (ref 4.0–10.5)
nRBC: 0 % (ref 0.0–0.2)

## 2023-10-30 LAB — BASIC METABOLIC PANEL WITH GFR
Anion gap: 12 (ref 5–15)
BUN: 24 mg/dL — ABNORMAL HIGH (ref 8–23)
CO2: 24 mmol/L (ref 22–32)
Calcium: 8.2 mg/dL — ABNORMAL LOW (ref 8.9–10.3)
Chloride: 103 mmol/L (ref 98–111)
Creatinine, Ser: 1.54 mg/dL — ABNORMAL HIGH (ref 0.61–1.24)
GFR, Estimated: 47 mL/min — ABNORMAL LOW (ref >60)
Glucose, Bld: 91 mg/dL (ref 70–99)
Potassium: 4.5 mmol/L (ref 3.5–5.1)
Sodium: 139 mmol/L (ref 135–145)

## 2023-10-30 MED ORDER — LIDOCAINE HCL 1 % IJ SOLN
10.0000 mL | Freq: Once | INTRAMUSCULAR | Status: DC
Start: 2023-10-30 — End: 2023-11-01
  Filled 2023-10-30: qty 10

## 2023-10-30 MED ORDER — MAGNESIUM SULFATE 4 GM/100ML IV SOLN
4.0000 g | Freq: Once | INTRAVENOUS | Status: AC
  Administered 2023-10-30: 4 g via INTRAVENOUS
  Filled 2023-10-30: qty 100

## 2023-10-30 MED ORDER — AMIODARONE HCL 200 MG PO TABS
400.0000 mg | ORAL_TABLET | Freq: Two times a day (BID) | ORAL | Status: DC
  Administered 2023-10-30 – 2023-11-01 (×5): 400 mg via ORAL
  Filled 2023-10-30 (×5): qty 2

## 2023-10-30 NOTE — Consult Note (Addendum)
 Initial Consultation Note   Patient: Chris Obrien FMW:[id number] DOB: 1950/06/05 PCP: Garald Karlynn GAILS, MD DOA: 10/24/2023 DOS: the patient was seen and examined on 10/30/2023 Primary service: Alvan Dorn FALCON, MD  Referring physician: Artist Pouch Reason for consult: Rash on chest  Assessment/Plan: Assessment and Plan: 37M h/o chronic granulomatous dermatitis on chest on cellcept (lymph node biopsy 4/25 not suggestive of lymphoma), hx poor healing right shoulder prosthetic joint (per patient) seeing ID d/t concern for infection, CKD, ascending aortic aneurysm who is admitted with suspicion of new cardiomyopathy c/f automimmune/inflammatory process (possibly cardiac amyloidosis vs sarcoidosis) for which medicine is consulted to eval known chronic granulomatous dermatitis.  Chronic granulomatous dermatitis Currently on cellcept, previously took methotrexate and hydroxychloroquine; follows with derm as outpatient; Lymph node biopsy 4/25 negative for lymphoma, skin biopsy [facility] 1/25 and 9/24 w/ intravascular histiocytosis (could be 2/2 RA or metallic prostheses).  -Continue current cardiac w/u and consider treatment for above presumed automimmune/inflammatory process with IV steroids and monitor rash for improvement; otherwise, defer to OP dermatology -Agree with EGS consult and biopsy (which has been sent off for evaluation)   TRH will sign off at present, please call us  again when needed.  HPI: Chris Obrien is a 73 y.o. male with past medical history of chronic granulomatous dermatitis on chest on cellcept (lymph node biopsy 4/25 not suggestive of lymphoma), hx poor healing right shoulder prosthetic joint (per patient) seeing ID d/t concern for infection, CKD, ascending aortic aneurysm who is admitted with suspicion of new cardiomyopathy c/f automimmune/inflammatory process (possibly cardiac amyloidosis vs sarcoidosis) for which medicine is consulted to eval known chronic  granulomatous dermatitis..  Review of Systems: As mentioned in the history of present illness. All other systems reviewed and are negative. 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   Oligospermia    OSA on CPAP    PT LOST  WEIGHT - NO LONGER USING CPAP   Pain    RIGHT SHOULDER DELTOID MUSCLE ATROPHY - LOSS OF STRENGT VERY LIMITED RANGE OF MOTTION   Right knee skin infection    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   Sleep apnea    weight loss so no cpap now    Swelling of joint of right knee    Urticaria    Past Surgical History:  Procedure Laterality Date   ADENOIDECTOMY     COLONOSCOPY     CORONARY PRESSURE/FFR STUDY N/A 10/27/2023   Procedure: CORONARY PRESSURE/FFR STUDY;  Surgeon: Mady Bruckner, MD;  Location: [facility];  Service: Cardiovascular;  Laterality: N/A;   EXCISIONAL TOTAL KNEE ARTHROPLASTY WITH ANTIBIOTIC SPACERS Right 04/27/2012   Procedure: EXCISIONAL TOTAL KNEE ARTHROPLASTY WITH ANTIBIOTIC SPACERS;  Surgeon: Donnice JONETTA Car, MD;  Location: [facility];  Service: Orthopedics;  Laterality: Right;  SAME DAY LABS   FRACTURE SURGERY  12/2020   IRRIGATION AND DEBRIDEMENT KNEE Right 04/02/2012   Procedure: IRRIGATION AND  DEBRIDEMENT RIGHT PRE-PATELLA  ;  Surgeon: Donnice JONETTA Car, MD;  Location: [facility];  Service: Orthopedics;  Laterality: Right;   JOINT REPLACEMENT  [id number]   KNEE ARTHROSCOPY  Right 08/2010   LEFT HEART CATH AND CORONARY ANGIOGRAPHY N/A 10/27/2023   Procedure: LEFT HEART CATH AND CORONARY ANGIOGRAPHY;  Surgeon: Mady Bruckner, MD;  Location: [facility];  Service: Cardiovascular;  Laterality: N/A;   POLYPECTOMY     SINOSCOPY     TONSILLECTOMY     TOTAL KNEE ARTHROPLASTY  01/28/2012   Procedure: TOTAL KNEE ARTHROPLASTY;  Surgeon: Donnice JONETTA Car, MD;  Location: [facility];  Service: Orthopedics;  Laterality: Right;   TOTAL KNEE ARTHROPLASTY Left 07/17/2015   Procedure: LEFT TOTAL KNEE ARTHROPLASTY;  Surgeon: Donnice Car, MD;  Location: [facility];  Service: Orthopedics;  Laterality: Left;   TOTAL KNEE REVISION Right 06/15/2012   Procedure: reimplantation of right total knee ;  Surgeon: Donnice JONETTA Car, MD;  Location: [facility];  Service: Orthopedics;  Laterality: Right;   TOTAL SHOULDER REPLACEMENT Bilateral    done in [facility]    ULNAR NERVE REPAIR Right 2008   Social History:  reports that he quit smoking about 35 years ago. His smoking use included cigars. He has never used smokeless tobacco. He reports that he does not currently use alcohol. He reports that he does not use drugs.  Allergies  Allergen Reactions   Amoxicillin  Hives   Quinolones Other (See Comments)    Ascending thoracic aortic aneurysm    Vancomycin  Rash    Severe redness all over body with swollen ankles and hands   Lactose Intolerance (Gi) Other (See Comments)    Red on skin   Oxycodone  Rash   Penicillins Itching and Rash   Tramadol  Itching    2019 Shoulder post-op    Family History  Problem Relation Age of Onset   Alzheimer's disease Mother    Arthritis Father    Diabetes Brother    Hyperlipidemia Other    Colon cancer Neg Hx    Colon polyps Neg Hx    Esophageal cancer Neg Hx    Stomach cancer Neg Hx    Rectal cancer Neg Hx     Prior to Admission medications   Medication Sig Start Date End Date Taking? Authorizing Provider  B Complex-C-Folic Acid  (B COMPLEX-VITAMIN C-FOLIC ACID )  1 MG tablet Take 1 tablet by mouth daily with breakfast. 07/10/22  Yes Plotnikov, Aleksei V, MD  celecoxib  (CELEBREX ) 200 MG capsule Take 1-2 capsules (200-400 mg total) by mouth daily as needed. Patient taking differently: Take 400 mg by mouth at bedtime. 09/29/23  Yes Plotnikov, Aleksei V, MD  Cholecalciferol (VITAMIN D3) 50 MCG (2000 UT) capsule Take 2 capsules (4,000 Units total) by mouth daily. Patient taking differently: Take 2,000 Units by mouth daily. 07/10/22  Yes Plotnikov, Aleksei V, MD  famotidine  (PEPCID ) 20 MG tablet Take 20 mg by mouth at bedtime.   Yes [provider]  ferrous sulfate  325 (65 FE) MG tablet Take 325 mg by mouth daily with breakfast.   Yes [provider]  Melatonin 10-10 MG TBCR Take 10 mg by mouth at bedtime as needed (sleep issues). Bedtime   Yes [provider]  Minoxidil (ROGAINE MENS) 5 % FOAM Apply 1 Application topically 4 (four) times a week.   Yes [provider]  Multiple Vitamin (MULTI-VITAMIN) tablet Take 1 tablet by mouth daily. [facility] 50+ 07/08/13  Yes [provider]  mycophenolate (CELLCEPT) 500 MG tablet Take 500 mg by mouth 2 (two) times daily.   Yes [provider]  omeprazole  (PRILOSEC) 40 MG capsule Take 1 capsule (40 mg total) by mouth 2 (two) times daily before a meal for 30 days, THEN 1 capsule (40 mg total) daily. 06/03/23 02/07/24 Yes Mansouraty, Aloha Raddle., MD  tamsulosin  (FLOMAX ) 0.4 MG CAPS capsule Take 0.8 mg by mouth in the morning. 02/04/21  Yes [provider]  testosterone  cypionate (DEPOTESTOSTERONE CYPIONATE) 200 MG/ML injection INJECT 0.5ML (100MG  TOTAL) INTO THE MUSCLE EVERY 7 DAYS - SINGLE USE VIAL - CONTENTS MUST BE DISCARDED AFTER FIRST USE 06/09/23  Yes Plotnikov, Karlynn GAILS, MD  tirzepatide  (ZEPBOUND ) 2.5 MG/0.5ML injection vial Inject 2.5 mg into the skin once a week. 09/29/23  Yes Plotnikov, Karlynn GAILS, MD    Physical Exam: Vitals:   10/30/23 0816 10/30/23 0952  10/30/23 1231 10/30/23 1558  BP: 127/64 127/64 135/66 129/67  Pulse: 65  61 60  Resp: 18  18 18   Temp: 98.4 F (36.9 C)  98.8 F (37.1 C) 98.9 F (37.2 C)  TempSrc: Oral  Oral Oral  SpO2: 95%   95%  Weight:      Height:       General: Alert, oriented x3, resting comfortably in no acute distress Respiratory: Lungs clear to auscultation bilaterally with normal respiratory effort; no w/r/r Cardiovascular: Regular rate and rhythm w/o m/r/g Skin: Diffuse erythema overlying chest and shoulders   Data Reviewed:   Lab Results  Component Value Date   WBC 6.7 10/30/2023   HGB 11.6 (L) 10/30/2023   HCT 36.6 (L) 10/30/2023   MCV 79.2 (L) 10/30/2023   PLT 417 (H) 10/30/2023   Lab Results  Component Value Date   GLUCOSE 91 10/30/2023   CALCIUM  8.2 (L) 10/30/2023   NA 139 10/30/2023   K 4.5 10/30/2023   CO2 24 10/30/2023   CL 103 10/30/2023   BUN 24 (H) 10/30/2023   CREATININE 1.54 (H) 10/30/2023   Lab Results  Component Value Date   ALT 22 10/24/2023   AST 21 10/24/2023   ALKPHOS 81 10/24/2023   BILITOT 0.5 10/24/2023   Lab Results  Component Value Date   INR 1.09 06/03/2012   INR 1.35 04/27/2012   INR 1.02 01/21/2012   Radiology: No results found.    Family Communication: N/A Primary team communication: Cards   Thank you very much for involving us  in the care of your patient.  Author: Marsha Ada, MD 10/30/2023 4:01 PM  For on call review [email or web address].

## 2023-10-30 NOTE — Consult Note (Signed)
 Consult Note  Chris Obrien 02-22-1950  [id number].    Requesting MD: Chris Dutch, PA-C Chief Complaint/Reason for Consult: Truncal rash with concern for sarcoidosis HPI:  Patient is a 74 year old male currently admitted for VT during cardiac stress testing. He was seen in the ED 9/27 for dizziness with exertion and SOB. He did not have elevating troponins, positive D-dimer without PE on CTA and was referred for outpatient cardiology. Underwent stress ECHO 10/17 and in first minute of bruce protocol developed sustained VT. Instructed to present to ED for urgent evaluation. Admitted for further workup. Underwent LHC 10/20 with mild to moderate non-obstructive disease, with 50% proximal LAD stenosis that was not hemodynamically significant. No clear etiology identified for VT. EP and HF teams have been consulted as well. Patient has a longstanding rash to chest that has not been clearly explained despite workup at academic centers and has been seen by rheumatology. Some intermittent low grade fevers. General surgery consulted for skin biopsy with concern for possible sarcoidosis. Patient reports rash has been present for 2 years but appears worsened recently. Not painful but does occasionally weep from more vesicular appearing lesions. Drainage is generally thin and clear.   ROS: Negative other than HPI  Family History  Problem Relation Age of Onset   Alzheimer's disease Mother    Arthritis Father    Diabetes Brother    Hyperlipidemia Other    Colon cancer Neg Hx    Colon polyps Neg Hx    Esophageal cancer Neg Hx    Stomach cancer Neg Hx    Rectal cancer Neg Hx     Past Medical History:  Diagnosis Date   Acute reactive arthritis (HCC) per pcp note from 03-30-2012   right pre-patella---  secondary contact dermitis with poison ivy exposure 03-26-2012   Allergy    Angio-edema    Arthritis    Benign neoplasm of colon    BPH (benign prostatic hypertrophy)    hx small stream    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   Dermatitis 09/02/2022   Fracture of right humerus 09/29/2023   GERD (gastroesophageal reflux disease)    rare    Hardware complicating wound infection 09/30/2023   Insomnia, unspecified    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   Oligospermia    OSA on CPAP    PT LOST  WEIGHT - NO LONGER USING CPAP   Pain    RIGHT SHOULDER DELTOID MUSCLE ATROPHY - LOSS OF STRENGT VERY LIMITED RANGE OF MOTTION   Right knee skin infection    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   Sleep apnea    weight loss so no cpap now    Swelling of joint of right knee    Urticaria     Past Surgical History:  Procedure Laterality Date   ADENOIDECTOMY     COLONOSCOPY     CORONARY PRESSURE/FFR STUDY N/A 10/27/2023   Procedure: CORONARY PRESSURE/FFR STUDY;  Surgeon: Chris Bruckner, MD;  Location: [facility];  Service: Cardiovascular;  Laterality: N/A;   EXCISIONAL TOTAL KNEE ARTHROPLASTY WITH ANTIBIOTIC SPACERS Right 04/27/2012   Procedure: EXCISIONAL TOTAL KNEE ARTHROPLASTY WITH ANTIBIOTIC SPACERS;  Surgeon: Chris JONETTA Car, MD;  Location: [facility];  Service: Orthopedics;  Laterality: Right;  SAME DAY LABS   FRACTURE SURGERY  12/2020   IRRIGATION AND DEBRIDEMENT KNEE Right 04/02/2012   Procedure: IRRIGATION AND DEBRIDEMENT RIGHT PRE-PATELLA  ;  Surgeon: Chris JONETTA Car, MD;  Location: [facility];  Service: Orthopedics;  Laterality: Right;   JOINT REPLACEMENT  [id number]   KNEE ARTHROSCOPY Right 08/2010   LEFT HEART CATH AND CORONARY ANGIOGRAPHY N/A 10/27/2023   Procedure: LEFT HEART CATH AND CORONARY ANGIOGRAPHY;  Surgeon: Chris Bruckner, MD;  Location: [facility];  Service: Cardiovascular;  Laterality: N/A;   POLYPECTOMY     SINOSCOPY     TONSILLECTOMY     TOTAL KNEE ARTHROPLASTY  01/28/2012   Procedure: TOTAL KNEE ARTHROPLASTY;  Surgeon: Chris JONETTA Car, MD;  Location: [facility];  Service: Orthopedics;  Laterality: Right;    TOTAL KNEE ARTHROPLASTY Left 07/17/2015   Procedure: LEFT TOTAL KNEE ARTHROPLASTY;  Surgeon: Chris Car, MD;  Location: [facility];  Service: Orthopedics;  Laterality: Left;   TOTAL KNEE REVISION Right 06/15/2012   Procedure: reimplantation of right total knee ;  Surgeon: Chris JONETTA Car, MD;  Location: [facility];  Service: Orthopedics;  Laterality: Right;   TOTAL SHOULDER REPLACEMENT Bilateral    done in [facility]    ULNAR NERVE REPAIR Right 2008    Social History:  reports that he quit smoking about 35 years ago. His smoking use included cigars. He has never used smokeless tobacco. He reports that he does not currently use alcohol. He reports that he does not use drugs.  Allergies:  Allergies  Allergen Reactions   Amoxicillin  Hives   Quinolones Other (See Comments)    Ascending thoracic aortic aneurysm    Vancomycin  Rash    Severe redness all over body with swollen ankles and hands   Lactose Intolerance (Gi) Other (See Comments)    Red on skin   Oxycodone  Rash   Penicillins Itching and Rash   Tramadol  Itching    2019 Shoulder post-op    Medications Prior to Admission  Medication Sig Dispense Refill   B Complex-C-Folic Acid  (B COMPLEX-VITAMIN C-FOLIC ACID ) 1 MG tablet Take 1 tablet by mouth daily with breakfast. 100 tablet 3   celecoxib  (CELEBREX ) 200 MG capsule Take 1-2 capsules (200-400 mg total) by mouth daily as needed. (Patient taking differently: Take 400 mg by mouth at bedtime.) 180 capsule 0   Cholecalciferol (VITAMIN D3) 50 MCG (2000 UT) capsule Take 2 capsules (4,000 Units total) by mouth daily. (Patient taking differently: Take 2,000 Units by mouth daily.) 100 capsule 3   famotidine  (PEPCID ) 20 MG tablet Take 20 mg by mouth at bedtime.     ferrous sulfate  325 (65 FE) MG tablet Take 325 mg by mouth daily with breakfast.     Melatonin 10-10 MG TBCR Take 10 mg by mouth at bedtime as needed (sleep issues). Bedtime     Minoxidil (ROGAINE MENS) 5 % FOAM Apply 1 Application topically 4  (four) times a week.     Multiple Vitamin (MULTI-VITAMIN) tablet Take 1 tablet by mouth daily. [employer] Men's 50+     mycophenolate (CELLCEPT) 500 MG tablet Take 500 mg by mouth 2 (two) times daily.     omeprazole  (PRILOSEC) 40 MG capsule Take 1 capsule (40 mg total) by mouth 2 (two) times daily before a meal for 30 days, THEN 1 capsule (40 mg total) daily. 90 capsule 6   tamsulosin  (FLOMAX ) 0.4 MG CAPS capsule Take 0.8 mg by mouth in the morning.     testosterone  cypionate (DEPOTESTOSTERONE CYPIONATE) 200 MG/ML injection INJECT 0.5ML (100MG  TOTAL) INTO THE MUSCLE EVERY 7 DAYS - SINGLE USE VIAL - CONTENTS MUST BE DISCARDED AFTER FIRST USE 10 mL 1   tirzepatide  (ZEPBOUND ) 2.5 MG/0.5ML  injection vial Inject 2.5 mg into the skin once a week. 2 mL 5    Blood pressure 135/66, pulse 61, temperature 98.8 F (37.1 C), temperature source Oral, resp. rate 18, height 6' 1 (1.854 m), weight 106.8 kg, SpO2 95%. Physical Exam:  General: pleasant, WD, WN male who is laying in bed in NAD HEENT: head is normocephalic, atraumatic.  Sclera are noninjected.  PERRL.  Ears and nose without any masses or lesions.  Mouth is pink and moist Heart: regular, rate, and rhythm.  Lungs: CTAB, no wheezes, rhonchi, or rales noted.  Respiratory effort nonlabored Abd: soft, NT, ND, +BS, no masses, hernias, or organomegaly MS: all 4 extremities are symmetrical with no cyanosis, clubbing, or edema. Skin: rash extending over chest and upper abdomen that is erythematous and maculopapular with some more vesicular appearing lesions as well Neuro: Cranial nerves 2-12 grossly intact, sensation is normal throughout Psych: A&Ox3 with an appropriate affect.   Results for orders placed or performed during the hospital encounter of 10/24/23 (from the past 48 hours)  CBC     Status: Abnormal   Collection Time: 10/29/23  5:13 AM  Result Value Ref Range   WBC 6.3 4.0 - 10.5 K/uL   RBC 4.62 4.22 - 5.81 MIL/uL   Hemoglobin 11.8 (L) 13.0 -  17.0 g/dL   HCT 63.0 (L) 60.9 - 47.9 %   MCV 79.9 (L) 80.0 - 100.0 fL   MCH 25.5 (L) 26.0 - 34.0 pg   MCHC 32.0 30.0 - 36.0 g/dL   RDW 83.3 (H) 88.4 - 84.4 %   Platelets 400 150 - 400 K/uL   nRBC 0.0 0.0 - 0.2 %    Comment: Performed at [facility], [address], [place], [place] [address]  Basic metabolic panel     Status: Abnormal   Collection Time: 10/29/23  5:13 AM  Result Value Ref Range   Sodium 137 135 - 145 mmol/L   Potassium 4.5 3.5 - 5.1 mmol/L   Chloride 101 98 - 111 mmol/L   CO2 26 22 - 32 mmol/L   Glucose, Bld 97 70 - 99 mg/dL    Comment: Glucose reference range applies only to samples taken after fasting for at least 8 hours.   BUN 27 (H) 8 - 23 mg/dL   Creatinine, Ser 8.12 (H) 0.61 - 1.24 mg/dL   Calcium  8.4 (L) 8.9 - 10.3 mg/dL   GFR, Estimated 37 (L) >60 mL/min    Comment: (NOTE) Calculated using the CKD-EPI Creatinine Equation (2021)    Anion gap 10 5 - 15    Comment: Performed at [facility], [address], [place], [place] [address]  Sedimentation rate     Status: Abnormal   Collection Time: 10/29/23  5:13 AM  Result Value Ref Range   Sed Rate 48 (H) 0 - 16 mm/hr    Comment: Performed at [facility], [address], [place], [place] [address]  C-reactive protein     Status: Abnormal   Collection Time: 10/29/23  5:13 AM  Result Value Ref Range   CRP 11.0 (H) <1.0 mg/dL    Comment: Performed at [facility], [address], [place], [place] [address]  CBC     Status: Abnormal   Collection Time: 10/30/23  4:23 AM  Result Value Ref Range   WBC 6.7 4.0 - 10.5 K/uL   RBC 4.62 4.22 - 5.81 MIL/uL   Hemoglobin 11.6 (L) 13.0 -  17.0 g/dL   HCT 63.3 (L) 60.9 - 47.9 %   MCV 79.2 (L) 80.0 - 100.0 fL   MCH 25.1 (L) 26.0 - 34.0 pg   MCHC 31.7 30.0 - 36.0 g/dL   RDW 83.4 (H) 88.4 - 84.4 %   Platelets 417 (H) 150 - 400 K/uL   nRBC 0.0 0.0 - 0.2 %    Comment: Performed at [facility], [address], [place], [place] [address]   Basic metabolic panel     Status: Abnormal   Collection Time: 10/30/23  4:23 AM  Result Value Ref Range   Sodium 139 135 - 145 mmol/L   Potassium 4.5 3.5 - 5.1 mmol/L   Chloride 103 98 - 111 mmol/L   CO2 24 22 - 32 mmol/L   Glucose, Bld 91 70 - 99 mg/dL    Comment: Glucose reference range applies only to samples taken after fasting for at least 8 hours.   BUN 24 (H) 8 - 23 mg/dL   Creatinine, Ser 8.45 (H) 0.61 - 1.24 mg/dL   Calcium  8.2 (L) 8.9 - 10.3 mg/dL   GFR, Estimated 47 (L) >60 mL/min    Comment: (NOTE) Calculated using the CKD-EPI Creatinine Equation (2021)    Anion gap 12 5 - 15    Comment: Performed at [facility], [address], [place], [place] [address]   MR CARDIAC MORPHOLOGY W WO CONTRAST Result Date: 10/29/2023 CLINICAL DATA:  Ventricular tachycardia (VT), sustained EXAM: MR CARDIA MORPHOLOGY WITHOUT AND WITH CONTRAST; MR CARDIAC VELOCITY FLOW MAPPING TECHNIQUE: The patient was scanned on a 1.5 Tesla Siemens magnet. A dedicated cardiac coil was used. Functional imaging was done using TrueFisp sequences. 2,3, and 4 chamber views were done to assess for RWMA's. Modified Simpson's rule using a short axis stack was used to calculate an ejection fraction on a dedicated work [employer]. The patient received 10mL GADAVIST GADOBUTROL 1 MMOL/ML IV SOLN. After 10 minutes inversion recovery sequences were used to assess for infiltration and scar tissue. Phase contrast velocity encoded images obtained x 2. This examination is tailored for evaluation cardiac anatomy and function and provides very limited assessment of noncardiac structures, which are accordingly not evaluated during interpretation. If there is clinical concern for extracardiac pathology, further evaluation with CT imaging should be considered. FINDINGS: 1. The left ventricle is normal in cavity size and moderate-severe left ventricular hypertrophy most prominent in the septum (2.0 cm). Global  systolic function is normal with an LV ejection fraction calculated at 61%. There are no regional wall motion abnormalities. 2. The right ventricle is normal in cavity size and systolic function with a mild increase in RV wall thickness. There are no RV wall motion abnormalities or aneurysms. 3. Both atria are mildly dilated. 4. The aortic valve is trileaflet in morphology. There is no significant aortic valve stenosis or regurgitation. 5. There is flow acceleration at the LV septum without clear SAM. The gradient through the LVOT was not assessed. There is mild MR and TR. There is moderate PR. 6. Delayed enhancement imaging is abnormal. There is diffuse patchy subendocardial and mid-myocardial enhancement from base to apex with a more prominent focus of mid-myocardial enhancement in the basal lateral wall. The native T1 valve = 1143 and ECV = 40, which are increased. 7. There is no pericardial effusion. The thickness of the pericardium is normal. 8. The ascending thoracic aorta is mildly enlarged at 4.1 cm. 9. Normal pulmonary venous drainage. 10.  There is a pulmonary nodule present in the right middle lobe of the lung. Recommend dedicated CT imaging for further evaluation. CONCLUSIONS: 1.  Moderate to severe LVH with preserved LV systolic function. 2. There is diffuse yet subtle late gadolinium enhancement of the subendocardium and mid-myocardium with prolonged T1 and ECV values with a more prominent focus of mid-myocardial LGE in the basal lateral wall. 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 Sarcoidosis is on the differential as well, but seems less likely. 4. Recommend screening for cardiac amyloid to confirm the diagnosis. 5. Recommend dedicated chest CT imaging to better assess the right lung nodule. MEASUREMENTS: LEFT VENTRICLE Left ventricular chamber size at end diastole: 4.2 cm. Left ventricular septal wall thickness: 2.0 cm. Left ventricular posterior wall thickness: 1.5 cm. Left  ventricular maximal wall thickness: 2.0 cm. LV male LV EF: 61% (normal 49-79%) Absolute volumes: LV EDV: (normal 95-215 mL) LV ESV: 65mL (normal 25-85 mL) LV SV: (normal 61-145 mL) CO: 6.7L/min (normal 3.4-7.8 L/min) Indexed volumes: CI: 2.9L/min/sq-m (normal 1.8-4.2 L/min/sq-m) LV EDV: 6mL/sq-m (normal 50-108 mL/sq-m) LV ESV: 60mL/sq-m (normal 11-47 mL/sq-m) LV SV: 34mL/sq-m (normal 33-72 mL/sq-m) Native T1 = 1143 ECV = 40 RIGHT VENTRICLE Normal right ventricular chamber size. Mildly increased RV free wall thickness. RV male RV EF:  56% (normal 51-80%) Absolute volumes: RV EDV: (normal 109-217 mL) RV ESV: 97mL (normal 23-91 mL) RV SV: (normal 71-141 mL) CO: 8.0L/min (normal 2.8-8.8 L/min) Indexed volumes: CI: 3.4L/min/sq-m (normal 1.7-4.2 L/min/sq-m) RV EDV: 85mL/sq-m (normal 58-109 mL/sq-m) RV ESV: 47mL/sq-m (normal 12-46 mL/sq-m) RV SV: 78mL/sq-m (normal 38-71 mL/sq-m) ATRIA Left atrium: Mildly enlarged with normal morphology. Right atrium: Mildly enlarged with normal morphology. VALVES Aortic valve: Trileaflet without significant stenosis or regurgitation. Mitral valve: Normal morphology with mild MR. Tricuspid valve: Trivial TR, normal morphology Pulmonic valve: Moderate centrally directed pulmonic regurgitation. Aorta: Mild dilation of the ascending thoracic aorta measuring 4.1 cm. PERICARDIUM Normal pericardium.  No pericardial effusion. OTHER There is a pulmonary nodule seen in the right middle lobe of the lung. Recommend dedicated CT imaging of the chest for further evaluation. Electronically Signed   By: Georganna Archer   On: 10/29/2023 20:11   MR CARDIAC VELOCITY FLOW MAP Result Date: 10/29/2023 CLINICAL DATA:  Ventricular tachycardia (VT), sustained EXAM: MR CARDIA MORPHOLOGY WITHOUT AND WITH CONTRAST; MR CARDIAC VELOCITY FLOW MAPPING TECHNIQUE: The patient was scanned on a 1.5 Tesla Siemens magnet. A dedicated cardiac coil was used. Functional imaging was done using  TrueFisp sequences. 2,3, and 4 chamber views were done to assess for RWMA's. Modified Simpson's rule using a short axis stack was used to calculate an ejection fraction on a dedicated work [employer]. The patient received 10mL GADAVIST GADOBUTROL 1 MMOL/ML IV SOLN. After 10 minutes inversion recovery sequences were used to assess for infiltration and scar tissue. Phase contrast velocity encoded images obtained x 2. This examination is tailored for evaluation cardiac anatomy and function and provides very limited assessment of noncardiac structures, which are accordingly not evaluated during interpretation. If there is clinical concern for extracardiac pathology, further evaluation with CT imaging should be considered. FINDINGS: 1. The left ventricle is normal in cavity size and moderate-severe left ventricular hypertrophy most prominent in the septum (2.0 cm). Global systolic function is normal with an LV ejection fraction calculated at 61%. There are no regional wall motion abnormalities. 2. The right ventricle is normal in cavity size and systolic function with a mild increase in RV  wall thickness. There are no RV wall motion abnormalities or aneurysms. 3. Both atria are mildly dilated. 4. The aortic valve is trileaflet in morphology. There is no significant aortic valve stenosis or regurgitation. 5. There is flow acceleration at the LV septum without clear SAM. The gradient through the LVOT was not assessed. There is mild MR and TR. There is moderate PR. 6. Delayed enhancement imaging is abnormal. There is diffuse patchy subendocardial and mid-myocardial enhancement from base to apex with a more prominent focus of mid-myocardial enhancement in the basal lateral wall. The native T1 valve = 1143 and ECV = 40, which are increased. 7. There is no pericardial effusion. The thickness of the pericardium is normal. 8. The ascending thoracic aorta is mildly enlarged at 4.1 cm. 9. Normal pulmonary venous  drainage. 10. There is a pulmonary nodule present in the right middle lobe of the lung. Recommend dedicated CT imaging for further evaluation. CONCLUSIONS: 1.  Moderate to severe LVH with preserved LV systolic function. 2. There is diffuse yet subtle late gadolinium enhancement of the subendocardium and mid-myocardium with prolonged T1 and ECV values with a more prominent focus of mid-myocardial LGE in the basal lateral wall. 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 Sarcoidosis is on the differential as well, but seems less likely. 4. Recommend screening for cardiac amyloid to confirm the diagnosis. 5. Recommend dedicated chest CT imaging to better assess the right lung nodule. MEASUREMENTS: LEFT VENTRICLE Left ventricular chamber size at end diastole: 4.2 cm. Left ventricular septal wall thickness: 2.0 cm. Left ventricular posterior wall thickness: 1.5 cm. Left ventricular maximal wall thickness: 2.0 cm. LV male LV EF: 61% (normal 49-79%) Absolute volumes: LV EDV: (normal 95-215 mL) LV ESV: 65mL (normal 25-85 mL) LV SV: (normal 61-145 mL) CO: 6.7L/min (normal 3.4-7.8 L/min) Indexed volumes: CI: 2.9L/min/sq-m (normal 1.8-4.2 L/min/sq-m) LV EDV: 73mL/sq-m (normal 50-108 mL/sq-m) LV ESV: 62mL/sq-m (normal 11-47 mL/sq-m) LV SV: 21mL/sq-m (normal 33-72 mL/sq-m) Native T1 = 1143 ECV = 40 RIGHT VENTRICLE Normal right ventricular chamber size. Mildly increased RV free wall thickness. RV male RV EF:  56% (normal 51-80%) Absolute volumes: RV EDV: (normal 109-217 mL) RV ESV: 97mL (normal 23-91 mL) RV SV: (normal 71-141 mL) CO: 8.0L/min (normal 2.8-8.8 L/min) Indexed volumes: CI: 3.4L/min/sq-m (normal 1.7-4.2 L/min/sq-m) RV EDV: 74mL/sq-m (normal 58-109 mL/sq-m) RV ESV: 36mL/sq-m (normal 12-46 mL/sq-m) RV SV: 42mL/sq-m (normal 38-71 mL/sq-m) ATRIA Left atrium: Mildly enlarged with normal morphology. Right atrium: Mildly enlarged with normal morphology. VALVES Aortic valve: Trileaflet  without significant stenosis or regurgitation. Mitral valve: Normal morphology with mild MR. Tricuspid valve: Trivial TR, normal morphology Pulmonic valve: Moderate centrally directed pulmonic regurgitation. Aorta: Mild dilation of the ascending thoracic aorta measuring 4.1 cm. PERICARDIUM Normal pericardium.  No pericardial effusion. OTHER There is a pulmonary nodule seen in the right middle lobe of the lung. Recommend dedicated CT imaging of the chest for further evaluation. Electronically Signed   By: Georganna Archer   On: 10/29/2023 20:11   MR CARDIAC VELOCITY FLOW MAP Result Date: 10/29/2023 CLINICAL DATA:  Ventricular tachycardia (VT), sustained EXAM: MR CARDIA MORPHOLOGY WITHOUT AND WITH CONTRAST; MR CARDIAC VELOCITY FLOW MAPPING TECHNIQUE: The patient was scanned on a 1.5 Tesla Siemens magnet. A dedicated cardiac coil was used. Functional imaging was done using TrueFisp sequences. 2,3, and 4 chamber views were done to assess for RWMA's. Modified Simpson's rule using a short axis stack was used to calculate an ejection fraction on a dedicated work  station using [employer]. The patient received 10mL GADAVIST GADOBUTROL 1 MMOL/ML IV SOLN. After 10 minutes inversion recovery sequences were used to assess for infiltration and scar tissue. Phase contrast velocity encoded images obtained x 2. This examination is tailored for evaluation cardiac anatomy and function and provides very limited assessment of noncardiac structures, which are accordingly not evaluated during interpretation. If there is clinical concern for extracardiac pathology, further evaluation with CT imaging should be considered. FINDINGS: 1. The left ventricle is normal in cavity size and moderate-severe left ventricular hypertrophy most prominent in the septum (2.0 cm). Global systolic function is normal with an LV ejection fraction calculated at 61%. There are no regional wall motion abnormalities. 2. The right ventricle is normal in  cavity size and systolic function with a mild increase in RV wall thickness. There are no RV wall motion abnormalities or aneurysms. 3. Both atria are mildly dilated. 4. The aortic valve is trileaflet in morphology. There is no significant aortic valve stenosis or regurgitation. 5. There is flow acceleration at the LV septum without clear SAM. The gradient through the LVOT was not assessed. There is mild MR and TR. There is moderate PR. 6. Delayed enhancement imaging is abnormal. There is diffuse patchy subendocardial and mid-myocardial enhancement from base to apex with a more prominent focus of mid-myocardial enhancement in the basal lateral wall. The native T1 valve = 1143 and ECV = 40, which are increased. 7. There is no pericardial effusion. The thickness of the pericardium is normal. 8. The ascending thoracic aorta is mildly enlarged at 4.1 cm. 9. Normal pulmonary venous drainage. 10. There is a pulmonary nodule present in the right middle lobe of the lung. Recommend dedicated CT imaging for further evaluation. CONCLUSIONS: 1.  Moderate to severe LVH with preserved LV systolic function. 2. There is diffuse yet subtle late gadolinium enhancement of the subendocardium and mid-myocardium with prolonged T1 and ECV values with a more prominent focus of mid-myocardial LGE in the basal lateral wall. 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 Sarcoidosis is on the differential as well, but seems less likely. 4. Recommend screening for cardiac amyloid to confirm the diagnosis. 5. Recommend dedicated chest CT imaging to better assess the right lung nodule. MEASUREMENTS: LEFT VENTRICLE Left ventricular chamber size at end diastole: 4.2 cm. Left ventricular septal wall thickness: 2.0 cm. Left ventricular posterior wall thickness: 1.5 cm. Left ventricular maximal wall thickness: 2.0 cm. LV male LV EF: 61% (normal 49-79%) Absolute volumes: LV EDV: (normal 95-215 mL) LV ESV: 65mL (normal 25-85 mL) LV  SV: (normal 61-145 mL) CO: 6.7L/min (normal 3.4-7.8 L/min) Indexed volumes: CI: 2.9L/min/sq-m (normal 1.8-4.2 L/min/sq-m) LV EDV: 77mL/sq-m (normal 50-108 mL/sq-m) LV ESV: 75mL/sq-m (normal 11-47 mL/sq-m) LV SV: 72mL/sq-m (normal 33-72 mL/sq-m) Native T1 = 1143 ECV = 40 RIGHT VENTRICLE Normal right ventricular chamber size. Mildly increased RV free wall thickness. RV male RV EF:  56% (normal 51-80%) Absolute volumes: RV EDV: (normal 109-217 mL) RV ESV: 97mL (normal 23-91 mL) RV SV: (normal 71-141 mL) CO: 8.0L/min (normal 2.8-8.8 L/min) Indexed volumes: CI: 3.4L/min/sq-m (normal 1.7-4.2 L/min/sq-m) RV EDV: 90mL/sq-m (normal 58-109 mL/sq-m) RV ESV: 69mL/sq-m (normal 12-46 mL/sq-m) RV SV: 26mL/sq-m (normal 38-71 mL/sq-m) ATRIA Left atrium: Mildly enlarged with normal morphology. Right atrium: Mildly enlarged with normal morphology. VALVES Aortic valve: Trileaflet without significant stenosis or regurgitation. Mitral valve: Normal morphology with mild MR. Tricuspid valve: Trivial TR, normal morphology Pulmonic valve: Moderate centrally directed pulmonic regurgitation. Aorta: Mild  dilation of the ascending thoracic aorta measuring 4.1 cm. PERICARDIUM Normal pericardium.  No pericardial effusion. OTHER There is a pulmonary nodule seen in the right middle lobe of the lung. Recommend dedicated CT imaging of the chest for further evaluation. Electronically Signed   By: Georganna Archer   On: 10/29/2023 20:11   Skin Biopsy Procedure Note   Procedure: Punch biopsy   Pre-operative Diagnosis: Rash   Post-procedure Diagnosis: same   Indications: rash with concern for sarcoidosis   Anesthesia: lidocaine  1%   Procedure Details  The procedure, risks and complications have been discussed in detail (including, but not limited to pain, infection, bleeding) with the patient, and the patient wishes to proceed with the procedure. The skin was sterilely prepped over the affected area in the usual fashion.  Using a 6 mm punch 2 skin biopsies were taken from rash on right chest. Specimen placed in formalin specimen cup. Pressure held until hemostasis reached. Dry dressing applied. The patient was observed until stable. There were no complications, and the patient tolerated the procedure well.    Assessment/Plan Truncal rash with concern for sarcoidosis  Plan:  - 6 mm punch biopsy performed x2 and sent for pathology. Procedure note above.  - ok to remove dressing in 1-2 hrs and shower if desired. Hold pressure for any oozing - dry dressing PRN. No suture placed - general surgery will sign off, please call as needed     I reviewed Consultant cardiology, EP and HF notes, last 24 h vitals and pain scores, last 48 h intake and output, last 24 h labs and trends, and last 24 h imaging results.  This care required moderate level of medical decision making.   Burnard JONELLE Louder, [facility] Surgery 10/30/2023, 1:22 PM Please see Amion for pager number during day hours 7:00am-4:30pm

## 2023-10-30 NOTE — Progress Notes (Signed)
  Patient Name: Chris Obrien Date of Encounter: 10/30/2023  Primary Cardiologist: Jerel Balding, MD Electrophysiologist: None  Interval Summary   The patient is doing well today. I feel the same as I did when I came in.  At this time, the patient denies chest pain, shortness of breath, or any new concerns.  Vital Signs    Vitals:   10/29/23 1227 10/29/23 1830 10/29/23 2010 10/30/23 0013  BP: (!) 142/61 139/78 (!) 149/61 119/64  Pulse: 64 69 66 63  Resp: 18 19 20 16   Temp: 98.2 F (36.8 C)  98.9 F (37.2 C) 98.5 F (36.9 C)  TempSrc: Oral  Oral Oral  SpO2: 95% 97% 98% 99%  Weight:      Height:       No intake or output data in the 24 hours ending 10/30/23 0727 Filed Weights   10/24/23 2026 10/25/23 1259  Weight: 104.3 kg 106.8 kg    Physical Exam    GEN- NAD, Alert and oriented  Lungs- Clear to ausculation bilaterally, normal work of breathing Cardiac- Regular rate and rhythm, no murmurs, rubs or gallops GI- soft, NT, ND, + BS Extremities- no clubbing or cyanosis. No edema  Telemetry    Sinus rhythm with intermittent PVCs. Brief ventricular bigeminy ~1743 10/22. Appears to have occurred with HR increase to ~90bpm (personally reviewed)  Hospital Course    Chris Obrien is a 73 y.o. male with no prior cardiac history who is being seen today for the evaluation of VT at the request of Dr. Court. Patient presented to the ED on 9/27 with exertional dizziness and shortness of breath. There were no acute findings in the ED and patient was subsequently referred to outpatient cardiology. He underwent treadmill stress test on 10/17 and developed sustained VT. While in VT, had symptoms consistent with recent dizziness/dyspnea. Upon termination of the test, ECG shows spontaneous resolution of VT. Patient referred back to the ED. Patient started on ASA and beta blocker, admitted for further workup.   Assessment & Plan    Ventricular tachycardia Patient with VT induced on  recent exercise stress test, recreating dizziness and dyspnea that patient has been experiencing with exertion. 12 lead ECG indicates apical septal origin for these PVCs. With lack of hemodynamically significant CAD, normal EF, and onset with exertion, consistent with automatic VT.  Clinical suspicion for acute on chronic inflammatory process remains high. MRI found preserved EF, moderate to severe LVH with diffuse/subtle late gadolinium enhancement of subendocardium and mid-myocardium. More prominent focus of mid-myocardial LGE in basal lateral wall. Findings most suggestive of amyloidosis but can't exclude sarcoidosis per reading physician. Telemetry last 24 hours shows isolated PVCs with a brief episode of ventricular bigeminy ~1743. This appears to have occurred when patient's HR rose to near 90bpm.  Given above findings on MRI as well as bigeminy yesterday with minimal elevation of HR, will defer ETT. Will engage HF team regarding further workup, possible PET/biopsy? Could potentially consider transfer to academic center as well. Start Amiodarone 400mg  BID x10 days, then 200mg  daily.  Continue Diltiazem. Stop Metoprolol.  Pulmonary nodule cMRI noted pulmonary nodule in right middle lobe of lung. Dedicated CT chest recommended.      For questions or updates, please contact  [facility] Please consult [email or web address] for contact info under     Signed, Artist Pouch, PA-C  10/30/2023, 7:27 AM

## 2023-10-30 NOTE — Progress Notes (Addendum)
 Progress Note  Patient Name: Chris Obrien Date of Encounter: 10/30/2023 [facility] Cardiologist: Jerel Balding, MD   Interval Summary    No complaints, wants to go home. Wife at the bedside.   Vital Signs Vitals:   10/29/23 1830 10/29/23 2010 10/30/23 0013 10/30/23 0816  BP: 139/78 (!) 149/61 119/64 127/64  Pulse: 69 66 63 65  Resp: 19 20 16 18   Temp:  98.9 F (37.2 C) 98.5 F (36.9 C) 98.4 F (36.9 C)  TempSrc:  Oral Oral Oral  SpO2: 97% 98% 99% 95%  Weight:      Height:       No intake or output data in the 24 hours ending 10/30/23 0931    10/25/2023   12:59 PM 10/24/2023    8:26 PM 10/07/2023    3:04 PM  Last 3 Weights  Weight (lbs) 235 lb 6.4 oz 230 lb 232 lb  Weight (kg) 106.777 kg 104.327 kg 105.235 kg      Telemetry/ECG   Sinus Rhythm, PVCs, some bigeminy - Personally Reviewed  Physical Exam  GEN: No acute distress.   Neck: No JVD Cardiac: RRR, no murmurs, rubs, or gallops.  Respiratory: Clear to auscultation bilaterally. GI: Soft, nontender, non-distended  MS: No edema  Assessment & Plan   Patient with past medical history significant for aneurysm of ascending aorta, GERD, history of infection of prosthetic shoulder joint, granulomatous dermatitis, OSA, GERD, BPH. Patient had been seen in the ED.  Sent for stress testing when he had sustained episode of VT and admitted.   VT -- Underwent echo stress 10/17 developed episode of sustained VT for approximately 30 seconds. -- Echo showed preserved biventricular function, mild to moderate LA dilated LA with no significant valvular disease -- Cardiac catheterization in 10/20 with mild to moderate nonobstructive disease of 50% proximal LAD stenosis which was not hemodynamically significant by RFR of 0.91, 50 to 60% ostial ramus with normal filling pressures. -- Cardiac MRI showed preserved EF, moderate to severe LVH with diffuse/subtle late gadolinium enhancement of subendocardium and  mid-myocardium. More prominent focus of mid-myocardial LGE in basal lateral wall. Findings most suggestive of amyloidosis but can't exclude sarcoidosis per MD -- obtain SPEP, UPEP and light chains  -- Continue Toprol XL 25 mg twice daily, Cardizem CD 120 mg daily -- EP following, initial thought was for possible ETT today but this has been canceled.   Ascending aortic aneurysm -- Noted on CT, 4.2 cm   Hx of Granulomatous dermatitis -- Extensive workup done at [facility] -- On CellCept 500 mg twice daily   CKD -- Baseline creatinine 1.3-1.6, up to 1.87>> improved to 1.57 after IVFs   Nonobstructive CAD HLD -- on ASA, statin   Pulmonary nodule -- cMRI noted on pulmonary nodule in the right middle lobe of lung, recommendations for dedicated chest CT to better assess   Will follow up with EP recommendations today  For questions or updates, please contact [facility] Please consult [email or web address] for contact info under   Signed, Manuelita Rummer, NP   Patient seen and examined, note reviewed with the signed Advanced Practice Provider. I personally reviewed laboratory data, imaging studies and relevant notes. I independently examined the patient and formulated the important aspects of the plan. I have personally discussed the plan with the patient and/or family. Comments or changes to the note/plan are indicated below.  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  He is being followed in EP we will defer to their recommendations for pursuing further testing. Agree with canceling the treadmill stress test today.   Chris Parada DO, MS [facility] Attending Cardiologist [facility]  [address] [place], [place] [address] [phone] Website:  [email or web address]

## 2023-10-30 NOTE — Plan of Care (Signed)
  Problem: Education: Goal: Understanding of cardiac disease, CV risk reduction, and recovery process will improve Outcome: Progressing   Problem: Cardiac: Goal: Ability to achieve and maintain adequate cardiovascular perfusion will improve Outcome: Progressing   Problem: Education: Goal: Knowledge of General Education information will improve Description: Including pain rating scale, medication(s)/side effects and non-pharmacologic comfort measures Outcome: Progressing

## 2023-10-30 NOTE — Consult Note (Addendum)
 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 Cindie with EP.  73 y.o. 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 No prior cardiac history until recently.   He's had lightheadedness and shortness of breath with heavy exertion for about a month. Had a near syncopal episode last month and was seen in ED. Also endorses breaking out in hot/cold sweats at night.   Underwent stress echo on 10/24/23. One minute into stage I bruce protocol developed symptomatic sustained VT which terminated spontaneously. Exercise was aborted. BP stable. He was short of breath. EF preserved. He had gone home but was later called by Cardiologist to return to ED for urgent evaluation. He was admitted under Cardiology service for further workup. Cardiac cath with 50% p LAD (not significant by RFR, 0.91), 50-60% ostial ramus.  EP Consulted. He was started on diltiazem CD and metoprolol xl. High suspicion for acute on chronic inflammatory process. cMRI: LVEF 61%, moderate to severe LVH (septum 2.0 cm) w/o clear SAM, RV function okay, diffuse patchy LGE subendocardial and mid-myocardial LGE from base to apex, ECV 40%. Study suggestive of cardiac amyloidosis vs sarcoidosis.  No tobacco use, quit drinking ETOH about 6 months ago (previously a glass of wine a day).  Home Medications Prior to Admission medications   Medication Sig Start Date End Date Taking? Authorizing Provider  B Complex-C-Folic Acid  (B COMPLEX-VITAMIN C-FOLIC ACID ) 1 MG tablet Take 1 tablet by mouth  daily with breakfast. 07/10/22  Yes Plotnikov, Aleksei V, MD  celecoxib  (CELEBREX ) 200 MG capsule Take 1-2 capsules (200-400 mg total) by mouth daily as needed. Patient taking differently: Take 400 mg by mouth at bedtime. 09/29/23  Yes Plotnikov, Aleksei V, MD  Cholecalciferol (VITAMIN D3) 50 MCG (2000 UT) capsule Take 2 capsules (4,000 Units total) by mouth daily. Patient taking differently: Take 2,000 Units by mouth daily. 07/10/22  Yes Plotnikov, Aleksei V, MD  famotidine  (PEPCID ) 20 MG tablet Take 20 mg by mouth at bedtime.   Yes [provider]  ferrous sulfate  325 (65 FE) MG tablet Take 325 mg by mouth daily with breakfast.   Yes [provider]  Melatonin 10-10 MG TBCR Take 10 mg by mouth at bedtime as needed (sleep issues). Bedtime   Yes [provider]  Minoxidil (ROGAINE MENS) 5 % FOAM Apply 1 Application topically 4 (four) times a week.   Yes [provider]  Multiple Vitamin (MULTI-VITAMIN) tablet Take 1 tablet by mouth daily. [facility] 50+ 07/08/13  Yes [provider]  mycophenolate (CELLCEPT) 500 MG tablet Take 500 mg by mouth 2 (two) times daily.   Yes [provider]  omeprazole  (PRILOSEC) 40 MG capsule Take 1 capsule (40 mg total) by mouth 2 (two) times daily before a meal for 30 days, THEN 1 capsule (40 mg total) daily. 06/03/23 02/07/24 Yes Mansouraty, Aloha Raddle., MD  tamsulosin  (FLOMAX ) 0.4 MG CAPS capsule Take 0.8 mg by mouth in the morning. 02/04/21  Yes [provider]  testosterone  cypionate (DEPOTESTOSTERONE CYPIONATE) 200 MG/ML injection INJECT 0.5ML (100MG  TOTAL) INTO THE MUSCLE EVERY 7  DAYS - SINGLE USE VIAL - CONTENTS MUST BE DISCARDED AFTER FIRST USE 06/09/23  Yes Plotnikov, Karlynn GAILS, MD  tirzepatide  (ZEPBOUND ) 2.5 MG/0.5ML injection vial Inject 2.5 mg into the skin once a week. 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   Dermatitis 09/02/2022   Fracture of right humerus 09/29/2023   GERD (gastroesophageal reflux disease)    rare    Hardware complicating wound infection 09/30/2023   Insomnia, unspecified    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   Oligospermia    OSA on CPAP    PT LOST  WEIGHT - NO LONGER USING CPAP   Pain    RIGHT SHOULDER DELTOID MUSCLE ATROPHY - LOSS OF STRENGT VERY LIMITED RANGE OF MOTTION   Right knee skin infection    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   Sleep apnea    weight loss so no cpap now    Swelling of joint of right knee    Urticaria     Past Surgical History: Past Surgical History:  Procedure Laterality Date   ADENOIDECTOMY     COLONOSCOPY     CORONARY PRESSURE/FFR STUDY N/A 10/27/2023   Procedure: CORONARY PRESSURE/FFR STUDY;  Surgeon: Mady Bruckner, MD;  Location: [facility];  Service: Cardiovascular;  Laterality: N/A;   EXCISIONAL TOTAL KNEE ARTHROPLASTY WITH ANTIBIOTIC SPACERS Right 04/27/2012   Procedure: EXCISIONAL TOTAL KNEE ARTHROPLASTY WITH ANTIBIOTIC SPACERS;  Surgeon: Donnice JONETTA Car, MD;  Location: [facility];  Service: Orthopedics;  Laterality: Right;  SAME DAY LABS   FRACTURE SURGERY  12/2020   IRRIGATION AND DEBRIDEMENT KNEE Right 04/02/2012   Procedure: IRRIGATION AND DEBRIDEMENT RIGHT PRE-PATELLA  ;  Surgeon: Donnice JONETTA Car, MD;  Location: [facility];  Service: Orthopedics;  Laterality: Right;   JOINT REPLACEMENT  [id number]   KNEE ARTHROSCOPY Right 08/2010   LEFT HEART CATH AND CORONARY ANGIOGRAPHY N/A 10/27/2023   Procedure: LEFT HEART CATH AND CORONARY ANGIOGRAPHY;  Surgeon: Mady Bruckner, MD;  Location: [facility];  Service: Cardiovascular;  Laterality: N/A;   POLYPECTOMY     SINOSCOPY     TONSILLECTOMY     TOTAL KNEE ARTHROPLASTY  01/28/2012   Procedure: TOTAL KNEE ARTHROPLASTY;  Surgeon: Donnice JONETTA Car, MD;  Location: [facility];  Service: Orthopedics;  Laterality: Right;   TOTAL KNEE ARTHROPLASTY Left 07/17/2015   Procedure: LEFT TOTAL KNEE ARTHROPLASTY;  Surgeon: Donnice Car, MD;  Location: [facility];  Service: Orthopedics;  Laterality: Left;   TOTAL KNEE REVISION Right 06/15/2012   Procedure: reimplantation of right total knee ;  Surgeon: Donnice JONETTA Car, MD;  Location: [facility];  Service: Orthopedics;  Laterality: Right;   TOTAL SHOULDER REPLACEMENT Bilateral    done in [place]    ULNAR NERVE REPAIR Right 2008    Family History: Family History  Problem Relation Age of Onset   Alzheimer's disease Mother    Arthritis Father    Diabetes Brother    Hyperlipidemia Other    Colon cancer Neg Hx    Colon polyps Neg Hx    Esophageal  cancer Neg Hx    Stomach cancer Neg Hx    Rectal cancer Neg Hx     Social History: Social History   Socioeconomic History   Marital status: Married    Spouse name: Not on file   Number of children: Not on file   Years of education: 16   Highest education level: Bachelor's degree (e.g., BA, AB, BS)  Occupational History   Occupation: Production designer, theatre/television/film    Comment: Marketing executive   Occupation: MANAGER    Employer: [employer]  Tobacco Use   Smoking status: Former    Types: Cigars    Quit date: 1990    Years since quitting: 35.8   Smokeless tobacco: Never  Vaping Use   Vaping status: Never Used  Substance and Sexual Activity   Alcohol use: Not Currently    Comment: occasionally   Drug use: No   Sexual activity: Not on file  Other Topics Concern   Not on file  Social History Narrative   ** Merged History Encounter **       Caffeine 4 cups avg daily, 4 cans soda.   Social Drivers of Corporate investment banker Strain: Low Risk  (08/22/2023)   Overall  Financial Resource Strain (CARDIA)    Difficulty of Paying Living Expenses: Not hard at all  Food Insecurity: No Food Insecurity (10/25/2023)   Hunger Vital Sign    Worried About Running Out of Food in the Last Year: Never true    Ran Out of Food in the Last Year: Never true  Transportation Needs: No Transportation Needs (10/25/2023)   PRAPARE - Administrator, Civil Service (Medical): No    Lack of Transportation (Non-Medical): No  Physical Activity: Sufficiently Active (08/22/2023)   Exercise Vital Sign    Days of Exercise per Week: 4 days    Minutes of Exercise per Session: 40 min  Stress: No Stress Concern Present (08/22/2023)   [employer] of Occupational Health - Occupational Stress Questionnaire    Feeling of Stress: Not at all  Social Connections: Moderately Integrated (10/25/2023)   Social Connection and Isolation Panel    Frequency of Communication with Friends and Family: More than three times a week    Frequency of Social Gatherings with Friends and Family: Three times a week    Attends Religious Services: Never    Active Member of Clubs or Organizations: Yes    Attends Banker Meetings: Never    Marital Status: Married    Allergies:  Allergies  Allergen Reactions   Amoxicillin  Hives   Quinolones Other (See Comments)    Ascending thoracic aortic aneurysm    Vancomycin  Rash    Severe redness all over body with swollen ankles and hands   Lactose Intolerance (Gi) Other (See Comments)    Red on skin   Oxycodone  Rash   Penicillins Itching and Rash   Tramadol  Itching    2019 Shoulder post-op    Objective:    Vital Signs:   Temp:  [98.2 F (36.8 C)-98.9 F (37.2 C)] 98.4 F (36.9 C) (10/23 0816) Pulse Rate:  [63-69] 65 (10/23 0816) Resp:  [16-20] 18 (10/23 0816) BP: (119-149)/(61-78) 127/64 (10/23 0952) SpO2:  [95 %-99 %] 95 % (10/23 0816) Last BM Date : 10/29/23  Weight change: Filed Weights   10/24/23 2026 10/25/23 1259   Weight: 104.3 kg 106.8 kg    Intake/Output:  No intake or output data in the 24 hours ending 10/30/23  1059    Physical Exam    General:  Well appearing. Sitting up in bed. Cor: No JVD. Regular rate & rhythm. No murmurs. Lungs: clear Abdomen: soft, nontender, nondistended. Extremities: no edema Neuro: alert & orientedx3. Affect pleasant Derm: Diffuse, raised rash on upper chest   Telemetry   SR with PVCs  Labs   Basic Metabolic Panel: Recent Labs  Lab 10/24/23 2026 10/25/23 1355 10/27/23 0359 10/28/23 0348 10/29/23 0513 10/30/23 0423  NA 137  --  136 134* 137 139  K 4.5  --  4.4 4.8 4.5 4.5  CL 104  --  104 102 101 103  CO2 23  --  22 21* 26 24  GLUCOSE 136*  --  100* 92 97 91  BUN 25*  --  23 24* 27* 24*  CREATININE 1.70* 1.66* 1.60* 1.69* 1.87* 1.54*  CALCIUM  8.7*  --  8.3* 8.1* 8.4* 8.2*  MG 1.8  --  1.8  --   --   --     Liver Function Tests: Recent Labs  Lab 10/24/23 2026  AST 21  ALT 22  ALKPHOS 81  BILITOT 0.5  PROT 6.6  ALBUMIN 2.9*   No results for input(s): LIPASE, AMYLASE in the last 168 hours. No results for input(s): AMMONIA in the last 168 hours.  CBC: Recent Labs  Lab 10/24/23 2026 10/25/23 1355 10/27/23 0359 10/29/23 0513 10/30/23 0423  WBC 9.0 9.9 6.6 6.3 6.7  NEUTROABS 6.1  --   --   --   --   HGB 13.3 12.2* 12.1* 11.8* 11.6*  HCT 41.8 38.7* 38.1* 36.9* 36.6*  MCV 80.5 80.0 79.7* 79.9* 79.2*  PLT 471* 439* 385 400 417*    Cardiac Enzymes: No results for input(s): CKTOTAL, CKMB, CKMBINDEX, TROPONINI in the last 168 hours.  BNP: BNP (last 3 results) Recent Labs    10/04/23 1531 10/24/23 2023  BNP 168.4* 155.9*    ProBNP (last 3 results) No results for input(s): PROBNP in the last 8760 hours.   CBG: No results for input(s): GLUCAP in the last 168 hours.  Coagulation Studies: No results for input(s): LABPROT, INR in the last 72 hours.   Imaging   No results found.   Medications:      Current Medications:  amiodarone  400 mg Oral BID   aspirin  EC  81 mg Oral Daily   atorvastatin  40 mg Oral Daily   B-complex with vitamin C  1 tablet Oral Daily   cholecalciferol  2,000 Units Oral Daily   diltiazem  120 mg Oral Q12H   famotidine   20 mg Oral QHS   ferrous sulfate   325 mg Oral Q breakfast   multivitamin with minerals  1 tablet Oral Daily   mycophenolate  500 mg Oral BID   tamsulosin   0.8 mg Oral Daily    Infusions:     Patient Profile   73 y.o. 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 Admitted for workup after episode of sustained VT during exercise stress echo.  Assessment/Plan   Cardiomyopathy -New diagnosis. Etiology not yet clear -Echo 10/25: EF 65-70%, RV okay -cMRI: LVEF 61%, moderate to severe LVH (septum 2.0 cm) w/o clear SAM, RV function okay, diffuse patchy LGE subendocardial and mid-myocardial LGE from base to apex, ECV 40%. Study suggestive of cardiac amyloidosis vs sarcoidosis. -Overall presentation concerning for autoimmune/inflammatory process -Obtain labs to exclude AL amyloidosis. PYP scan. Check alpha galactosidase level.  ESR has been chronically elevated, CRP borderline elevated. Hold off on cardiac PET for now as he has been on immunosuppression with cellcept. NO mediastinal or hilar lymphadenopathy on CT chest which would go against sarcoidosis. Check ACE level. -Eventual genetic testing for HCM and Fabry's -Will see if General Surgery could potentially perform skin biopsy of rash on chest to assess for cutaneous sarcoidosis. 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 Consider another biopsy as above. - Negative ANA, RF and CCP in 10/24  R shoulder prosthesis -Expresses concern joint never healed post-op -Has seen ID, chronic infection suspected. Has been off antibiotics a couple of years. Planning to follow-up with surgeon in [facility] regarding hardware removal -Check blood cultures given night sweats/chills. Low grade temp a couple of days ago (tmax 100.3 F), afebrile today  Ascending aortic aneurysm -Measured 4.2cm on chest CT -Will need to be followed  CAD -Non obstructive on cardiac cath this admission -Aspirin  + statin  CKD IIIa - baseline Scr seems to be around 1.3 -Scr as high as 1.87 this admit improved with fluids   Length of Stay: 5  FINCH, LINDSAY N, PA-C  10/30/2023, 10:59 AM    Advanced Heart Failure Team Pager [phone] (M-F; 7a - 5p)  Please contact [facility] for night-coverage after hours (4p -7a ) and weekends on [email or web address]   Patient seen with PA, I formulated the plan and agree with the above note.   Complicated recent history as noted above. Patient has been followed by dermatology at [facility] for a bullous rash across his chest/upper abdomen x 2 years. 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    Whole body PET showed hypermetabolic adenopathy in bilateral axillae. He had lymph node biopsy negative for lymphoma, showed reactive lymph node tissue with intra-sinusoidal histiocytic proliferation. For the skin process, he was started on methotrexate initially along with hydroxychloroquine.  He took these for a couple of months, then in 9/25, both these meds were stopped due to side effects and mycophenolate mofetil was started.  He is still taking this at 500 mg bid.   He says that this has not changed the rash much.   About a month prior to admission, he developed lightheadedness and shortness of breath with exertion, 1 episode of syncope.  CTA chest in 9/25 showed no PE, no hilar/mediastinal lymphadenopathy, no sarcoid-type lung parenchymal disease.  He had a stress echo done in 10/25 with sustained VT and was admitted.  Cardiac MRI was done, showing LV EF 61% with moderate LVH (somewhat asymmetric in septum), normal RV size/systolic function, there was diffuse and somewhat subtle patchy mid-wall and subendocardial LGE from base=>apex, there was more prominent mid-wall LGE in the basal inferolateral wall, ECV 40% (I reviewed the study). LHC this admission showed 50% pLAD, negative RFR.      Patient developed symptomatic bigeminal PVCs yesterday when walking.  He was started on amiodarone.   No FH of cardiomyopathy or SCD.   Patient has no complaints at rest.    General:  NAD Neck: No JVD, no thyromegaly or thyroid  nodule.  Lungs: Clear to auscultation bilaterally with normal respiratory effort. CV: Nondisplaced PMI.  Heart regular S1/S2, no S3/S4, no murmur.  No peripheral edema.  No carotid bruit.  Normal pedal pulses.  Abdomen: Soft, nontender, no hepatosplenomegaly, no distention.  Skin: Bullous erythematous rash across chest.   Neurologic: Alert and oriented x 3.  Psych: Normal affect. Extremities: No clubbing or cyanosis.  HEENT: Normal.   1. Chest wall rash: Bullous rash.  Skin biopsy and axillary lymph node biopsy at [facility] were consistent with intravascular histiocytosis.  This can be primary or secondary to rheumatoid arthritis or metallic prosthesis.  Patient has a prosthetic shoulder joint.  This is a rare condition, but can be treated with immunosuppression. He has been on methotrexate + hydroxychloroquine initially, now on mycophenolate mofetil. ESR elevated at 48, CRP elevated at 11.  ANA, RF, anti-CCP ab all checked at [facility] in the past and  negative.  Based on past workup, this does not seem to be a sarcoidosis-type skin lesion. Patient had recent CTA chest with on hilar or mediastinal lymphadenopathy and no lung parenchymal changes consistent with sarcoidosis.  - He will continue MMF for now.  - We repeated the skin biopsy (see below) to see if there is any evidence for a unifying diagnosis with the cardiomyopathy.  2. Cardiomyopathy: Lightheadedness with exertion, found to have VT with stress echo and then PVCs with exertion.  LHC with nonobstructive CAD:  I reviewed the cardiac MRI, this showed LV EF 61% with moderate LVH (somewhat asymmetric in septum), normal RV size/systolic function, there was diffuse and somewhat subtle patchy mid-wall and subendocardial LGE from base=>apex, there was more prominent mid-wall LGE in the basal inferolateral wall, ECV 40%.  Unfortunately, differential is broad here.  Cardiac amyloidosis is a consideration with the LVH, diffuse and somewhat subtle LGE and elevated ECV, but this type of VT presentation is unusual and he does not have any other signs (peripheral neuropathy, CTS, etc). Cardiac sarcoidosis could also look like this, though prior biopsies of lymph node and skin did not show sarcoidosis and CT chest is not consistent with pulmonary sarcoidosis. He has been treated with immunosuppression that could be used to treat cardiac sarcoidosis for several months now (methotrexate then mycophenolate mofetil) and still had VT.  LGE pattern not typical for a familial arrhythmogenic cardiomyopathy and no family history.  Hypertrophic cardiomyopathy is a definite consideration though he does not carry a family history (somewhat asymmetric LVH and LGE).  Finally, also should rule out Fabry's with the prominent basal lateral LGE and LVH.  If the skin rash is from intravascular histiocytosis, I do not have a definite way to connect this to the cardiomyopathy.  - I will repeat the skin biopsy to reassess the skin  diagnosis, is there any sign of sarcoidosis or other unifying diagnosis?  - Send ACE level - Myeloma studies and PYP scan for cardiac amyloidosis.  - Cannot get cardiac PET for inflammatory evaluation as inpatient.  Regardless, this is going to be confounded by the fact that he has been on methotrexate and then mycophenolate mofetil.  Can consider as outpatient.  - He should get genetic testing to look for HCM and Fabry's as well as genes associated with arrhythmogenic cardiomyopathy as outpatient (I think arrhythmogenic cardiomyopathy may be less likely).  - If PYP and skin biopsy are unrevealing, consider endomyocardial biopsy as next step.  - After skin biopsy is done, will  start on prednisone  30 mg daily (this is the steroid initiation dose for treatment of cardiac sarcoidosis) given concern for inflammatory disease and ongoing ventricular arrhythmias.  3. VT: Has VT and PVCs with exertion.   - On amiodarone per EP.  4. CAD: Nonobstructive on cath.  - Continue statin.   This may end up being two separate diagnoses with intravascular histiocytosis as skin lesion and a separate cardiomyopathy (hypertrophic CMP vs cardiac amyloidosis seem most likely to me).   Ezra Shuck 10/30/2023 5:26 PM

## 2023-10-31 ENCOUNTER — Inpatient Hospital Stay (HOSPITAL_COMMUNITY)

## 2023-10-31 DIAGNOSIS — I429 Cardiomyopathy, unspecified: Secondary | ICD-10-CM | POA: Diagnosis not present

## 2023-10-31 DIAGNOSIS — I472 Ventricular tachycardia, unspecified: Secondary | ICD-10-CM | POA: Diagnosis not present

## 2023-10-31 LAB — CBC
HCT: 35.2 % — ABNORMAL LOW (ref 39.0–52.0)
Hemoglobin: 11.1 g/dL — ABNORMAL LOW (ref 13.0–17.0)
MCH: 25.2 pg — ABNORMAL LOW (ref 26.0–34.0)
MCHC: 31.5 g/dL (ref 30.0–36.0)
MCV: 80 fL (ref 80.0–100.0)
Platelets: 422 K/uL — ABNORMAL HIGH (ref 150–400)
RBC: 4.4 MIL/uL (ref 4.22–5.81)
RDW: 16.4 % — ABNORMAL HIGH (ref 11.5–15.5)
WBC: 5.8 K/uL (ref 4.0–10.5)
nRBC: 0 % (ref 0.0–0.2)

## 2023-10-31 LAB — BASIC METABOLIC PANEL WITH GFR
Anion gap: 9 (ref 5–15)
BUN: 22 mg/dL (ref 8–23)
CO2: 24 mmol/L (ref 22–32)
Calcium: 8 mg/dL — ABNORMAL LOW (ref 8.9–10.3)
Chloride: 101 mmol/L (ref 98–111)
Creatinine, Ser: 1.8 mg/dL — ABNORMAL HIGH (ref 0.61–1.24)
GFR, Estimated: 39 mL/min — ABNORMAL LOW (ref >60)
Glucose, Bld: 127 mg/dL — ABNORMAL HIGH (ref 70–99)
Potassium: 4.1 mmol/L (ref 3.5–5.1)
Sodium: 134 mmol/L — ABNORMAL LOW (ref 135–145)

## 2023-10-31 LAB — KAPPA/LAMBDA LIGHT CHAINS
Kappa free light chain: 26.5 mg/L — ABNORMAL HIGH (ref 3.3–19.4)
Kappa, lambda light chain ratio: 0.93 (ref 0.26–1.65)
Lambda free light chains: 28.4 mg/L — ABNORMAL HIGH (ref 5.7–26.3)

## 2023-10-31 LAB — MAGNESIUM: Magnesium: 2.1 mg/dL (ref 1.7–2.4)

## 2023-10-31 LAB — ANGIOTENSIN CONVERTING ENZYME: Angiotensin-Converting Enzyme: 46 U/L (ref 14–82)

## 2023-10-31 MED ORDER — PREDNISONE 20 MG PO TABS
30.0000 mg | ORAL_TABLET | Freq: Every day | ORAL | Status: DC
  Administered 2023-10-31 – 2023-11-01 (×2): 30 mg via ORAL
  Filled 2023-10-31 (×2): qty 1

## 2023-10-31 MED ORDER — TECHNETIUM TC 99M PYROPHOSPHATE
20.0000 | Freq: Once | INTRAVENOUS | Status: AC | PRN
Start: 2023-10-31 — End: 2023-10-31
  Administered 2023-10-31: 21.3 via INTRAVENOUS

## 2023-10-31 MED ORDER — SULFAMETHOXAZOLE-TRIMETHOPRIM 800-160 MG PO TABS: 1.0000 | ORAL_TABLET | ORAL | Status: DC

## 2023-10-31 NOTE — Progress Notes (Addendum)
 Patient Name: Chris Obrien Date of Encounter: 10/31/2023  Primary Cardiologist: Jerel Balding, MD Electrophysiologist: None  Interval Summary   The patient is doing well today. Curious about timing of PYP study today.  At this time, the patient denies chest pain, shortness of breath, or any new concerns.  Vital Signs    Vitals:   10/30/23 1725 10/30/23 1919 10/31/23 0017 10/31/23 0738  BP: 128/65 121/60 116/66 123/64  Pulse: 98 63 64 62  Resp:  16 20 15   Temp: 98.6 F (37 C) 98.9 F (37.2 C) 98.6 F (37 C) 98.8 F (37.1 C)  TempSrc: Oral Oral Oral Oral  SpO2: 93% 95% 98% 94%  Weight:      Height:        Intake/Output Summary (Last 24 hours) at 10/31/2023 9092 Last data filed at 10/30/2023 2100 Gross per 24 hour  Intake 678 ml  Output --  Net 678 ml   Filed Weights   10/24/23 2026 10/25/23 1259  Weight: 104.3 kg 106.8 kg    Physical Exam    GEN- NAD, Alert and oriented  Lungs- normal work of breathing Cardiac- Regular rate and rhythm GI- soft, NT, ND, + BS Extremities- no clubbing or cyanosis. No edema  Telemetry    Sinus rhythm with isolated PVCs. HR ~60s (personally reviewed)  Hospital Course    Chris Obrien is a 73 y.o. male with no prior cardiac history who is being seen today for the evaluation of VT at the request of Dr. Court. Patient presented to the ED on 9/27 with exertional dizziness and shortness of breath. There were no acute findings in the ED and patient was subsequently referred to outpatient cardiology. He underwent treadmill stress test on 10/17 and developed sustained VT. While in VT, had symptoms consistent with recent dizziness/dyspnea. Upon termination of the test, ECG shows spontaneous resolution of VT. Patient referred back to the ED. Patient started on ASA and beta blocker, admitted for further workup.   Assessment & Plan    Ventricular tachycardia Patient with VT induced on recent exercise stress test, recreating dizziness  and dyspnea that patient has been experiencing with exertion. 12 lead ECG indicates apical septal origin for these PVCs. With lack of hemodynamically significant CAD, normal EF, and onset with exertion, consistent with automatic VT.  Clinical suspicion for acute on chronic inflammatory process remains high. MRI found preserved EF, moderate to severe LVH with diffuse/subtle late gadolinium enhancement of subendocardium and mid-myocardium. More prominent focus of mid-myocardial LGE in basal lateral wall. Findings most suggestive of amyloidosis but can't exclude sarcoidosis per reading physician. Telemetry 10/22-10/23 isolated PVCs with a brief episode of ventricular bigeminy ~1743. This appears to have occurred when patient's HR rose to near 90bpm. No recurrent VT or bigeminy on 10/23. Continue Amiodarone 400mg  BID for full 10 days (started on 10/23), then 200mg  daily.  Continue Diltiazem. Encourage ambulation today to assess response to Amiodarone and Diltiazem. AHF team now following, assistance appreciated. Etiology of his cardiomyopathy remains elusive with his particular VT presentation atypical for amyloidosis and no other findings consistent with amyloid. Prior lymph node and skin biopsies have not been consistent with sarcoidosis. Also notable that patient had VT despite recent immuosuppression. HF team plans to rule out Fabry's. Skin biopsy also pending. ACE, myeloma studies, and PYP amyloid study also ordered. Plan is to start prednisone  30mg .     Chronic granulomatous dermatitis Erythematous and maculopapular rash diffusely across chest. Patient regularly follows with derm, is on  cellcept. Punch biopsy obtained this admission.   Pulmonary nodule cMRI noted pulmonary nodule in right middle lobe of lung. Dedicated CT chest recommended. Laprecious Austill defer to primary team.     For questions or updates, please contact [facility] Please consult [email or web address] for contact info under      Signed, Artist Pouch, PA-C  10/31/2023, 9:07 AM     Chris Obrien was seen by me today along with Artist Pouch. I have personally performed an evaluation on this patient.  My findings are as follows: 73 y.o. male no further ventricular arrhythmias.  Patient feeling well without complaint.  Questions about exercise..  Data: EKG(s) and pertinent labs, studies, etc were personally reviewed and interpreted by me:  Telemetry, labs Otherwise, I agree with data as outlined by the advanced practice provider.  Exam performed by me: Gen: No acute distress Neck: No JVD Cardiac: Regular Lungs: Normal work of breathing Extremities: No edema  My Assessment and Plan:  1.  Ventricular tachycardia: Has been started on amiodarone.  He has had no further episodes of ventricular tachycardia.  He was previously in the bigeminy, and this also occurred with exercise.  Exercise EKG has been canceled.  For now, we Synia Douglass continue with his current amiodarone dosing.  Awaiting PYP scan.  Yu Cragun likely need outpatient PET scan as well.  Signed,  Chris Obrien Gladis Norton, MD  10/31/2023 9:19 AM

## 2023-10-31 NOTE — Plan of Care (Signed)

## 2023-10-31 NOTE — Progress Notes (Signed)
  Patient Name: Chris Obrien Date of Encounter: 10/31/2023   Discussed w/EP MD, no ICD/life vest at this time given no syncope and ongoing workup of his cardiomyopathy with AHF team.   Pt advised to avoid elevated levels of exertion until otherwise advised.   Artist Pouch, PA-C

## 2023-10-31 NOTE — Progress Notes (Addendum)
 Advanced Heart Failure Rounding Note  Cardiologist: Jerel Balding, MD   Chief Complaint: VT  Subjective:    Walked down the hall today and felt great. No arrhythmias on telemetry.   Wondering about timing of PYP scan.   Objective:   Weight Range: 106.8 kg Body mass index is 31.06 kg/m.   Vital Signs:   Temp:  [98.6 F (37 C)-98.9 F (37.2 C)] 98.8 F (37.1 C) (10/24 0738) Pulse Rate:  [60-98] 62 (10/24 0738) Resp:  [15-20] 15 (10/24 0738) BP: (116-135)/(60-67) 123/64 (10/24 0738) SpO2:  [93 %-98 %] 94 % (10/24 0738) Last BM Date : 10/29/23  Weight change: Filed Weights   10/24/23 2026 10/25/23 1259  Weight: 104.3 kg 106.8 kg    Intake/Output:   Intake/Output Summary (Last 24 hours) at 10/31/2023 1017 Last data filed at 10/30/2023 2100 Gross per 24 hour  Intake 678 ml  Output --  Net 678 ml      Physical Exam    General:  Well appearing. Cor: No JVD. Regular rate & rhythm. No murmurs. Lungs: Clear Abdomen: Soft, nontender, nondistended. Extremities: No edema Derm: Diffuse rash on upper chest Neuro: Alert & orientedx3. Affect pleasant   Telemetry   Sinus/sinus brady 50s-60s, occasional PVCs  Labs    CBC Recent Labs    10/30/23 0423 10/31/23 0405  WBC 6.7 5.8  HGB 11.6* 11.1*  HCT 36.6* 35.2*  MCV 79.2* 80.0  PLT 417* 422*   Basic Metabolic Panel Recent Labs    89/76/74 0423 10/31/23 0405  NA 139 134*  K 4.5 4.1  CL 103 101  CO2 24 24  GLUCOSE 91 127*  BUN 24* 22  CREATININE 1.54* 1.80*  CALCIUM  8.2* 8.0*  MG  --  2.1   Liver Function Tests No results for input(s): AST, ALT, ALKPHOS, BILITOT, PROT, ALBUMIN in the last 72 hours. No results for input(s): LIPASE, AMYLASE in the last 72 hours. Cardiac Enzymes No results for input(s): CKTOTAL, CKMB, CKMBINDEX, TROPONINI in the last 72 hours.  BNP: BNP (last 3 results) Recent Labs    10/04/23 1531 10/24/23 2023  BNP 168.4* 155.9*    ProBNP  (last 3 results) No results for input(s): PROBNP in the last 8760 hours.   D-Dimer No results for input(s): DDIMER in the last 72 hours. Hemoglobin A1C No results for input(s): HGBA1C in the last 72 hours. Fasting Lipid Panel No results for input(s): CHOL, HDL, LDLCALC, TRIG, CHOLHDL, LDLDIRECT in the last 72 hours. Thyroid  Function Tests No results for input(s): TSH, T4TOTAL, T3FREE, THYROIDAB in the last 72 hours.  Invalid input(s): FREET3  Other results:   Imaging    No results found.   Medications:     Scheduled Medications:  amiodarone  400 mg Oral BID   aspirin  EC  81 mg Oral Daily   atorvastatin  40 mg Oral Daily   B-complex with vitamin C  1 tablet Oral Daily   cholecalciferol  2,000 Units Oral Daily   diltiazem  120 mg Oral Q12H   famotidine   20 mg Oral QHS   ferrous sulfate   325 mg Oral Q breakfast   lidocaine   10 mL Intradermal Once   multivitamin with minerals  1 tablet Oral Daily   mycophenolate  500 mg Oral BID   predniSONE   30 mg Oral Q breakfast   tamsulosin   0.8 mg Oral Daily    Infusions:   PRN Medications: acetaminophen , melatonin, nitroGLYCERIN, ondansetron  (ZOFRAN ) IV, technetium pyrophosphate Tc 69m  Patient Profile   73 y.o. 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 Admitted for workup after episode of sustained VT during exercise stress echo.   Assessment/Plan    1. Chest wall rash: Bullous rash.  Skin biopsy and axillary lymph node biopsy at [facility] were consistent with intravascular histiocytosis.  This can be primary or secondary to rheumatoid arthritis or metallic prosthesis.  Patient has a prosthetic shoulder joint.  This is a rare condition, but can be treated with immunosuppression. He has been on methotrexate + hydroxychloroquine initially, now on mycophenolate mofetil. ESR elevated at 48, CRP elevated at  11.  ANA, RF, anti-CCP ab all checked at [facility] in the past and negative.  Based on past workup, this does not seem to be a sarcoidosis-type skin lesion. Patient had recent CTA chest with on hilar or mediastinal lymphadenopathy and no lung parenchymal changes consistent with sarcoidosis.  - He will continue MMF for now.  - We repeated the skin biopsy (see below) to see if there is any evidence for a unifying diagnosis with the cardiomyopathy.  2. Cardiomyopathy: Lightheadedness with exertion, found to have VT with stress echo and then PVCs with exertion.  LHC with nonobstructive CAD:  I reviewed the cardiac MRI, this showed LV EF 61% with moderate LVH (somewhat asymmetric in septum), normal RV size/systolic function, there was diffuse and somewhat subtle patchy mid-wall and subendocardial LGE from base=>apex, there was more prominent mid-wall LGE in the basal inferolateral wall, ECV 40%.  Unfortunately, differential is broad here.  Cardiac amyloidosis is a consideration with the LVH, diffuse and somewhat subtle LGE and elevated ECV, but this type of VT presentation is unusual and he does not have any other signs (peripheral neuropathy, CTS, etc). Cardiac sarcoidosis could also look like this, though prior biopsies of lymph node and skin did not show sarcoidosis and CT chest is not consistent with pulmonary sarcoidosis. He has been treated with immunosuppression that could be used to treat cardiac sarcoidosis for several months now (methotrexate then mycophenolate mofetil) and still had VT.  LGE pattern not typical for a familial arrhythmogenic cardiomyopathy and no family history.  Hypertrophic cardiomyopathy is a definite consideration though he does not carry a family history (somewhat asymmetric LVH and LGE).  Finally, also should rule out Fabry's with the prominent basal lateral LGE and LVH.  If the skin rash is from intravascular histiocytosis, I do not have a definite way to connect this to the  cardiomyopathy.  - We repeated the skin biopsy to reassess the skin diagnosis, is there any sign of sarcoidosis or other unifying diagnosis - ACE level WNL - Myeloma studies pending and PYP scan planned today to r/o amyloidosis - Cannot get cardiac PET for inflammatory evaluation as inpatient.  Regardless, this is going to be confounded by the fact that he has been on methotrexate and then mycophenolate mofetil.  Can consider as outpatient.  - He should get genetic testing to look for HCM and Fabry's as well as genes associated with arrhythmogenic cardiomyopathy as outpatient (I think arrhythmogenic cardiomyopathy may be less likely).  I will also check alpha-galactosidase activity for Fabry's.  - If PYP and skin biopsy are unrevealing, consider endomyocardial biopsy as next step.  - Started on prednisone  30 mg daily (this is the steroid initiation dose for treatment of cardiac sarcoidosis) given concern for inflammatory disease and ongoing ventricular arrhythmias. Will need bactrim  prophylaxis.  3. VT: Has VT  and PVCs with exertion.   - On amiodarone and diltiazem per EP.  4. CAD: Nonobstructive on cath.  - Continue statin.    This may end up being two separate diagnoses with intravascular histiocytosis as skin lesion and a separate cardiomyopathy.    Length of Stay: 6  Chris Obrien, Chris N, PA-C  10/31/2023, 10:17 AM  Advanced Heart Failure Team Pager [phone] (M-F; 7a - 5p)  Please contact [facility] for night-coverage after hours (5p -7a ) and weekends on [email or web address]   Patient seen with PA, I formulated the plan and agree with the above not.   PVCs significantly decreased on amiodarone, was able to walk without arrhythmias.  He is now on amiodarone and diltiazem CD.  He had skin biopsy yesterday, to get PYP scan today.   General: NAD Neck: No JVD, no thyromegaly or thyroid  nodule.  Lungs: Clear to auscultation bilaterally with normal respiratory effort. CV: Nondisplaced PMI.   Heart regular S1/S2, no S3/S4, no murmur.  No peripheral edema.   Abdomen: Soft, nontender, no hepatosplenomegaly, no distention.  Skin: Erythematous, bullous rash on chest.   Neurologic: Alert and oriented x 3.  Psych: Normal affect. Extremities: No clubbing or cyanosis.  HEENT: Normal.   Cause of cardiomyopathy still not certain.  He has been immunosuppressed for several months for intravascular histiocytosis but still with VT/PVCs.  We have added prednisone  30 mg daily for the time being and will continue as we try to ascertain the cause of his infiltrative-appearing cardiomyopathy.  I am not convinced that this is sarcoidosis.  - PYP scan today to assess for evidence of TTR cardiac amyloidosis.  - Pending biopsy result from skin, ?evidence for cutaneous sarcoidosis.  - Alpha galactosidase level sent (?Fabry's).  - Could be hypertrophic cardiomyopathy, less likely familial arrhythmogenic CMP.  He needs genetic testing as outpatient.  - Will consider eventual cardiac PET but he has been immunosuppressed (mycophenolate).  - He may need a cardiac biopsy.  Ventricular arrhythmias controlled on amiodarone and diltiazem.   Once PYP scan is done, he could potentially go home and do the rest of the workup as outpatient (as long as arrhythmias stay quiescent).   Will continue prednisone  for now, will need Bactrim  prophylaxis and PPI.   Ezra Shuck 10/31/2023 11:14 AM

## 2023-11-01 ENCOUNTER — Other Ambulatory Visit (HOSPITAL_COMMUNITY): Payer: Self-pay

## 2023-11-01 DIAGNOSIS — I428 Other cardiomyopathies: Secondary | ICD-10-CM | POA: Diagnosis not present

## 2023-11-01 DIAGNOSIS — I472 Ventricular tachycardia, unspecified: Secondary | ICD-10-CM | POA: Diagnosis not present

## 2023-11-01 DIAGNOSIS — I2510 Atherosclerotic heart disease of native coronary artery without angina pectoris: Secondary | ICD-10-CM | POA: Insufficient documentation

## 2023-11-01 LAB — CBC
HCT: 36.9 % — ABNORMAL LOW (ref 39.0–52.0)
Hemoglobin: 11.9 g/dL — ABNORMAL LOW (ref 13.0–17.0)
MCH: 25.6 pg — ABNORMAL LOW (ref 26.0–34.0)
MCHC: 32.2 g/dL (ref 30.0–36.0)
MCV: 79.5 fL — ABNORMAL LOW (ref 80.0–100.0)
Platelets: 458 K/uL — ABNORMAL HIGH (ref 150–400)
RBC: 4.64 MIL/uL (ref 4.22–5.81)
RDW: 16.5 % — ABNORMAL HIGH (ref 11.5–15.5)
WBC: 7.5 K/uL (ref 4.0–10.5)
nRBC: 0 % (ref 0.0–0.2)

## 2023-11-01 LAB — BASIC METABOLIC PANEL WITH GFR
Anion gap: 10 (ref 5–15)
BUN: 20 mg/dL (ref 8–23)
CO2: 25 mmol/L (ref 22–32)
Calcium: 8.4 mg/dL — ABNORMAL LOW (ref 8.9–10.3)
Chloride: 99 mmol/L (ref 98–111)
Creatinine, Ser: 1.75 mg/dL — ABNORMAL HIGH (ref 0.61–1.24)
GFR, Estimated: 40 mL/min — ABNORMAL LOW (ref >60)
Glucose, Bld: 123 mg/dL — ABNORMAL HIGH (ref 70–99)
Potassium: 4.8 mmol/L (ref 3.5–5.1)
Sodium: 134 mmol/L — ABNORMAL LOW (ref 135–145)

## 2023-11-01 LAB — MAGNESIUM: Magnesium: 2.2 mg/dL (ref 1.7–2.4)

## 2023-11-01 MED ORDER — NITROGLYCERIN 0.4 MG SL SUBL
0.4000 mg | SUBLINGUAL_TABLET | SUBLINGUAL | 3 refills | Status: AC | PRN
  Filled 2023-11-01: qty 25, 8d supply, fill #0

## 2023-11-01 MED ORDER — AMIODARONE HCL 200 MG PO TABS
ORAL_TABLET | ORAL | 0 refills | Status: DC
  Filled 2023-11-01: qty 72, 44d supply, fill #0

## 2023-11-01 MED ORDER — SULFAMETHOXAZOLE-TRIMETHOPRIM 800-160 MG PO TABS
1.0000 | ORAL_TABLET | ORAL | 6 refills | Status: DC
  Filled 2023-11-01: qty 12, 28d supply, fill #0

## 2023-11-01 MED ORDER — PREDNISONE 10 MG PO TABS
30.0000 mg | ORAL_TABLET | Freq: Every day | ORAL | 1 refills | Status: DC
  Filled 2023-11-01: qty 90, 30d supply, fill #0
  Filled 2023-11-02: qty 270, 90d supply, fill #0

## 2023-11-01 MED ORDER — DILTIAZEM HCL ER COATED BEADS 120 MG PO CP24
120.0000 mg | ORAL_CAPSULE | Freq: Two times a day (BID) | ORAL | 3 refills | Status: AC
  Filled 2023-11-01: qty 90, 45d supply, fill #0

## 2023-11-01 MED ORDER — ASPIRIN 81 MG PO TBEC
81.0000 mg | DELAYED_RELEASE_TABLET | Freq: Every day | ORAL | 12 refills | Status: AC
  Filled 2023-11-01: qty 30, 30d supply, fill #0

## 2023-11-01 MED ORDER — ATORVASTATIN CALCIUM 40 MG PO TABS
40.0000 mg | ORAL_TABLET | Freq: Every day | ORAL | 3 refills | Status: AC
  Filled 2023-11-01: qty 90, 90d supply, fill #0

## 2023-11-01 NOTE — Discharge Summary (Addendum)
 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 The left ventricle is normal in cavity size and moderate-severe left ventricular hypertrophy most prominent in the septum (2.0 cm). Global systolic function is normal with an LV ejection fraction calculated at 61%. There are no regional wall motion abnormalities.   2. The right ventricle is normal in cavity size and systolic function with a mild increase in RV wall thickness. There are no RV wall motion abnormalities or aneurysms.   3. Both atria are mildly dilated.   4. The aortic valve is trileaflet in morphology. There is no significant aortic valve stenosis or regurgitation.   5. There is flow acceleration at the LV septum without clear SAM. The gradient through the LVOT was not assessed. There is mild MR and TR. There is moderate PR.   6. Delayed enhancement imaging is abnormal. There is diffuse patchy subendocardial and mid-myocardial enhancement from base to apex with a more prominent focus of mid-myocardial enhancement in the basal lateral wall. The native T1 valve = 1143 and ECV = 40, which are increased.   7. There is no pericardial effusion. The thickness of the pericardium is normal.   8. The ascending thoracic aorta is mildly enlarged at 4.1 cm.   9. Normal pulmonary venous drainage.   10. There is a pulmonary nodule present in the right middle  lobe of the lung. Recommend dedicated CT imaging for further evaluation.   CONCLUSIONS: 1.  Moderate to severe LVH with preserved LV systolic function.   2. There is diffuse yet subtle late gadolinium enhancement of the subendocardium and mid-myocardium with prolonged T1 and ECV values with a more prominent focus of mid-myocardial LGE in the basal lateral wall. 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 Sarcoidosis is on the differential as well, but seems less likely.   4. Recommend screening for cardiac amyloid to confirm the diagnosis.   5. Recommend dedicated chest CT imaging to better assess the right lung nodule.   Echo 10/27/23:  1. Left ventricular ejection fraction, by estimation, is 65 to 70%. The  left ventricle has normal function. The left ventricle has no regional  wall motion abnormalities. Left ventricular diastolic parameters are  consistent with Grade I diastolic  dysfunction (impaired relaxation).   2. Right ventricular systolic function is normal. The right ventricular  size is normal.   3. Left atrial size was mild to moderately dilated.   4. The mitral valve is grossly normal. No evidence of mitral valve  regurgitation. No evidence of mitral stenosis.   5. The aortic valve was not well visualized. Unable to determine aortic  valve morphology due to image quality. Aortic valve regurgitation is not  visualized. Aortic valve sclerosis is present, with no evidence of aortic  valve stenosis.   6. Both the aortic root and ascending aorta are normal when indexed to  BSA, age and sex.   7. The inferior vena cava is normal in size with  greater than 50%  respiratory variability, suggesting right atrial pressure of 3 mmHg.    LHC 10/2023: Conclusions: Mild-moderate, nonobstructive coronary artery disease, as detailed below, including 50% proximal LAD stenosis that is not hemodynamically significant (RFR = 0.91), 50-60% ostial ramus intermedius  lesion, and mild luminal irregularities in the LCx and RCA. Normal left ventricular filling pressure (LVEDP 8 mmHg).   Recommendations: Continue medical therapy and risk factor modification to prevent progression of disease. Proceed with cardiac MRI and electrophysiology consultation. Gentle post-catheterization hydration in the setting of moderate CKD.  _____________   History of Present Illness   Chris Obrien is a 73 y.o. male with  no prior cardiac history who is being seen 10/24/2023 for the evaluation of VT during stress testing.   Mr. Wandrey was seen in the ED 9/27 for exertional dizziness associated with SOB.  ED evaluation showed flat troponins, positive d-dimer but no PE on CTA.  Referred to outpatient cardiology followup.   Today underwent stress echo.  On minute into stage I bruce he developed sustained VT.  HR at peak stress 136s from baseline of 88. BP stable throughout. C/o SOB. EF preserved and augmented with stress.   Patient had already gone home and was called by Dr. Okey with instructions to return to the ED for urgent evaluation.   Currently asymptomatic.  Of note he has been evaluated at [facility] for chest rash felt possibly autoimmune in nature.  He had been on immunosuppressant prior to admission.   Hospital Course   Consultants: EP  Sustained VT Occurred during exercise portion of stress test, self-terminated --> sent to the ER for evaluation. K 4.5, Mg 1.8 (replaced).  On further evaluation, he reports 1 month of exertional dizziness and shortness of breath.  He denied syncope but has felt lightheaded.  Following the VT episode, he proceeded to [facility] which demonstrated no severe obstructive CAD.  Echocardiogram confirmed LVEF 65-70%, no R WMA, grade 1 DD, normal RV size and function, moderate LAE, no significant valvular disease.  EP was consulted and felt not a candidate for ICD given severe skin rash across his chest wall.   He was started on Cardizem  and Toprol with PO amiodarone load.    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 Amyloid PYP scan yesterday pending.   Unable to complete cardiac PET for inflammatory evaluation as inpatient.  Regardless, this is going to be confounded by the fact that he has been on methotrexate and then mycophenolate mofetil.  Can consider as outpatient. Plan for genetic testing to look for HCM and Fabry's as well as genes associated with arrhythmogenic cardiomyopathy as outpatient. Check alpha-galactosidase activity for Fabry's. If PYP and skin biopsy are unrevealing, consider endomyocardial biopsy as next step.  Started on prednisone  30 mg daily (this is the steroid initiation dose for treatment of cardiac sarcoidosis) given concern for inflammatory disease and ongoing ventricular arrhythmias. Will need bactrim  and GI prophylaxis on discharge.   I have messaged our team to arrange follow up with Dr. Rolan.    CAD LHC with 50% proximal LAD stenosis not significant by RFR, no intervention. Pursue risk factor modification Discharged with ASA and lipitor, PRN NTG   Chronic granulomatous dermatitis Located on chest, on cellcept at home, lymph node biopsy not suggestive of lymphoma. 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  He has been on methotrexate + hydroxychloroquine initially, now  on mycophenolate mofetil. ESR elevated at 48, CRP elevated at 11. ANA, RF, anti-CCP ab all checked at [facility] in the past and negative. Based on past workup, this does not seem to be a sarcoidosis-type skin lesion. Patient had recent CTA chest without hilar or mediastinal lymphadenopathy and no lung parenchymal changes consistent with sarcoidosis.  Question if allergic reaction to shoulder prosthesis hardware.    R shoulder prosthesis  Has seen ID, has been off ABX.  Planning follow up with surgeon for hardware removal.    Pt was instructed not to drive until seen in follow up. Given VT without known reversible cause, will avoid driving.  Pt seen and examined by Dr. Kennyth and deemed stable for discharge.       Did the patient have an acute coronary syndrome (MI, NSTEMI, STEMI, etc) this admission?:  No                               Did the patient have a percutaneous coronary intervention (stent / angioplasty)?:  No.        The patient will be scheduled for a TOC follow up appointment in 7-14 days.  A message has been sent to the [facility] and Scheduling Pool at the office where the patient should be seen for follow up.  _____________  Discharge Vitals Blood pressure 120/68, pulse (!) 57, temperature 97.8 F (36.6 C), temperature source Oral, resp. rate 16, height 6' 1 (1.854 m), weight 106.8 kg, SpO2 97%.  Filed Weights   10/24/23 2026 10/25/23 1259  Weight: 104.3 kg 106.8 kg    Labs & Radiologic Studies  CBC Recent Labs    10/31/23 0405 11/01/23 0526  WBC 5.8 7.5  HGB 11.1* 11.9*  HCT 35.2* 36.9*  MCV 80.0 79.5*  PLT 422* 458*   Basic Metabolic Panel Recent Labs    89/75/74 0405 11/01/23 0526  NA 134* 134*  K 4.1 4.8  CL 101 99  CO2 24 25  GLUCOSE 127* 123*  BUN 22 20  CREATININE 1.80* 1.75*  CALCIUM  8.0* 8.4*  MG 2.1 2.2   Liver Function Tests No results for input(s): AST, ALT, ALKPHOS, BILITOT, PROT, ALBUMIN in the last 72 hours. No results for input(s): LIPASE, AMYLASE in the last 72 hours. High Sensitivity Troponin:   Recent Labs  Lab 10/04/23 1624 10/04/23 1824 10/24/23 2026 10/24/23 2329  TROPONINIHS 53* 54* 78* 82*    No results for input(s): TRNPT in the last 720 hours.  BNP Invalid input(s): POCBNP No results for input(s): PROBNP in the last 72 hours.  No results for input(s): BNP in the last 72 hours.  D-Dimer No results for input(s): DDIMER in the last 72  hours. Hemoglobin A1C No results for input(s): HGBA1C in the last 72 hours. Fasting Lipid Panel No results for input(s): CHOL, HDL, LDLCALC, TRIG, CHOLHDL, LDLDIRECT in the last 72 hours. Lipoprotein (a)  Date/Time Value Ref Range Status  10/26/2023 04:36 AM 95.3 (H) <75.0 nmol/L Final    Comment:    (NOTE) This test was developed and its performance characteristics determined by [facility]. It has not been cleared or approved by the Food and Drug Administration. Note:  Values greater than or equal to 75.0 nmol/L may       indicate an independent risk factor for CHD,       but must be evaluated with caution when applied       to  non-Caucasian populations due to the       influence of genetic factors on Lp(a) across       ethnicities. Performed At: [facility] [address] [place], [place] [id number] Jennette Shorter MD Ey:[phone]     Thyroid  Function Tests No results for input(s): TSH, T4TOTAL, T3FREE, THYROIDAB in the last 72 hours.  Invalid input(s): FREET3 _____________  MR CARDIAC MORPHOLOGY W WO CONTRAST Result Date: 10/29/2023 CLINICAL DATA:  Ventricular tachycardia (VT), sustained EXAM: MR CARDIA MORPHOLOGY WITHOUT AND WITH CONTRAST; MR CARDIAC VELOCITY FLOW MAPPING TECHNIQUE: The patient was scanned on a 1.5 Tesla Siemens magnet. A dedicated cardiac coil was used. Functional imaging was done using TrueFisp sequences. 2,3, and 4 chamber views were done to assess for RWMA's. Modified Simpson's rule using a short axis stack was used to calculate an ejection fraction on a dedicated work [employer]. The patient received 10mL GADAVIST GADOBUTROL 1 MMOL/ML IV SOLN. After 10 minutes inversion recovery sequences were used to assess for infiltration and scar tissue. Phase contrast velocity encoded images obtained x 2. This examination is tailored for evaluation cardiac anatomy and function and provides very limited assessment of  noncardiac structures, which are accordingly not evaluated during interpretation. If there is clinical concern for extracardiac pathology, further evaluation with CT imaging should be considered. FINDINGS: 1. The left ventricle is normal in cavity size and moderate-severe left ventricular hypertrophy most prominent in the septum (2.0 cm). Global systolic function is normal with an LV ejection fraction calculated at 61%. There are no regional wall motion abnormalities. 2. The right ventricle is normal in cavity size and systolic function with a mild increase in RV wall thickness. There are no RV wall motion abnormalities or aneurysms. 3. Both atria are mildly dilated. 4. The aortic valve is trileaflet in morphology. There is no significant aortic valve stenosis or regurgitation. 5. There is flow acceleration at the LV septum without clear SAM. The gradient through the LVOT was not assessed. There is mild MR and TR. There is moderate PR. 6. Delayed enhancement imaging is abnormal. There is diffuse patchy subendocardial and mid-myocardial enhancement from base to apex with a more prominent focus of mid-myocardial enhancement in the basal lateral wall. The native T1 valve = 1143 and ECV = 40, which are increased. 7. There is no pericardial effusion. The thickness of the pericardium is normal. 8. The ascending thoracic aorta is mildly enlarged at 4.1 cm. 9. Normal pulmonary venous drainage. 10. There is a pulmonary nodule present in the right middle lobe of the lung. Recommend dedicated CT imaging for further evaluation. CONCLUSIONS: 1.  Moderate to severe LVH with preserved LV systolic function. 2. There is diffuse yet subtle late gadolinium enhancement of the subendocardium and mid-myocardium with prolonged T1 and ECV values with a more prominent focus of mid-myocardial LGE in the basal lateral wall. 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 Sarcoidosis is on the differential as well, but seems less  likely. 4. Recommend screening for cardiac amyloid to confirm the diagnosis. 5. Recommend dedicated chest CT imaging to better assess the right lung nodule. MEASUREMENTS: LEFT VENTRICLE Left ventricular chamber size at end diastole: 4.2 cm. Left ventricular septal wall thickness: 2.0 cm. Left ventricular posterior wall thickness: 1.5 cm. Left ventricular maximal wall thickness: 2.0 cm. LV male LV EF: 61% (normal 49-79%) Absolute volumes: LV EDV: (normal 95-215 mL) LV ESV: 65mL (normal 25-85 mL) LV SV: (normal 61-145 mL) CO: 6.7L/min (normal 3.4-7.8 L/min)  Indexed volumes: CI: 2.9L/min/sq-m (normal 1.8-4.2 L/min/sq-m) LV EDV: 39mL/sq-m (normal 50-108 mL/sq-m) LV ESV: 95mL/sq-m (normal 11-47 mL/sq-m) LV SV: 3mL/sq-m (normal 33-72 mL/sq-m) Native T1 = 1143 ECV = 40 RIGHT VENTRICLE Normal right ventricular chamber size. Mildly increased RV free wall thickness. RV male RV EF:  56% (normal 51-80%) Absolute volumes: RV EDV: (normal 109-217 mL) RV ESV: 97mL (normal 23-91 mL) RV SV: (normal 71-141 mL) CO: 8.0L/min (normal 2.8-8.8 L/min) Indexed volumes: CI: 3.4L/min/sq-m (normal 1.7-4.2 L/min/sq-m) RV EDV: 64mL/sq-m (normal 58-109 mL/sq-m) RV ESV: 51mL/sq-m (normal 12-46 mL/sq-m) RV SV: 88mL/sq-m (normal 38-71 mL/sq-m) ATRIA Left atrium: Mildly enlarged with normal morphology. Right atrium: Mildly enlarged with normal morphology. VALVES Aortic valve: Trileaflet without significant stenosis or regurgitation. Mitral valve: Normal morphology with mild MR. Tricuspid valve: Trivial TR, normal morphology Pulmonic valve: Moderate centrally directed pulmonic regurgitation. Aorta: Mild dilation of the ascending thoracic aorta measuring 4.1 cm. PERICARDIUM Normal pericardium.  No pericardial effusion. OTHER There is a pulmonary nodule seen in the right middle lobe of the lung. Recommend dedicated CT imaging of the chest for further evaluation. Electronically Signed   By: Georganna Archer   On: 10/29/2023 20:11    MR CARDIAC VELOCITY FLOW MAP Result Date: 10/29/2023 CLINICAL DATA:  Ventricular tachycardia (VT), sustained EXAM: MR CARDIA MORPHOLOGY WITHOUT AND WITH CONTRAST; MR CARDIAC VELOCITY FLOW MAPPING TECHNIQUE: The patient was scanned on a 1.5 Tesla Siemens magnet. A dedicated cardiac coil was used. Functional imaging was done using TrueFisp sequences. 2,3, and 4 chamber views were done to assess for RWMA's. Modified Simpson's rule using a short axis stack was used to calculate an ejection fraction on a dedicated work [employer]. The patient received 10mL GADAVIST GADOBUTROL 1 MMOL/ML IV SOLN. After 10 minutes inversion recovery sequences were used to assess for infiltration and scar tissue. Phase contrast velocity encoded images obtained x 2. This examination is tailored for evaluation cardiac anatomy and function and provides very limited assessment of noncardiac structures, which are accordingly not evaluated during interpretation. If there is clinical concern for extracardiac pathology, further evaluation with CT imaging should be considered. FINDINGS: 1. The left ventricle is normal in cavity size and moderate-severe left ventricular hypertrophy most prominent in the septum (2.0 cm). Global systolic function is normal with an LV ejection fraction calculated at 61%. There are no regional wall motion abnormalities. 2. The right ventricle is normal in cavity size and systolic function with a mild increase in RV wall thickness. There are no RV wall motion abnormalities or aneurysms. 3. Both atria are mildly dilated. 4. The aortic valve is trileaflet in morphology. There is no significant aortic valve stenosis or regurgitation. 5. There is flow acceleration at the LV septum without clear SAM. The gradient through the LVOT was not assessed. There is mild MR and TR. There is moderate PR. 6. Delayed enhancement imaging is abnormal. There is diffuse patchy subendocardial and mid-myocardial  enhancement from base to apex with a more prominent focus of mid-myocardial enhancement in the basal lateral wall. The native T1 valve = 1143 and ECV = 40, which are increased. 7. There is no pericardial effusion. The thickness of the pericardium is normal. 8. The ascending thoracic aorta is mildly enlarged at 4.1 cm. 9. Normal pulmonary venous drainage. 10. There is a pulmonary nodule present in the right middle lobe of the lung. Recommend dedicated CT imaging for further evaluation. CONCLUSIONS: 1.  Moderate to severe LVH with preserved LV systolic function. 2. There  is diffuse yet subtle late gadolinium enhancement of the subendocardium and mid-myocardium with prolonged T1 and ECV values with a more prominent focus of mid-myocardial LGE in the basal lateral wall. 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 Sarcoidosis is on the differential as well, but seems less likely. 4. Recommend screening for cardiac amyloid to confirm the diagnosis. 5. Recommend dedicated chest CT imaging to better assess the right lung nodule. MEASUREMENTS: LEFT VENTRICLE Left ventricular chamber size at end diastole: 4.2 cm. Left ventricular septal wall thickness: 2.0 cm. Left ventricular posterior wall thickness: 1.5 cm. Left ventricular maximal wall thickness: 2.0 cm. LV male LV EF: 61% (normal 49-79%) Absolute volumes: LV EDV: (normal 95-215 mL) LV ESV: 65mL (normal 25-85 mL) LV SV: (normal 61-145 mL) CO: 6.7L/min (normal 3.4-7.8 L/min) Indexed volumes: CI: 2.9L/min/sq-m (normal 1.8-4.2 L/min/sq-m) LV EDV: 38mL/sq-m (normal 50-108 mL/sq-m) LV ESV: 28mL/sq-m (normal 11-47 mL/sq-m) LV SV: 69mL/sq-m (normal 33-72 mL/sq-m) Native T1 = 1143 ECV = 40 RIGHT VENTRICLE Normal right ventricular chamber size. Mildly increased RV free wall thickness. RV male RV EF:  56% (normal 51-80%) Absolute volumes: RV EDV: (normal 109-217 mL) RV ESV: 97mL (normal 23-91 mL) RV SV: (normal 71-141 mL) CO: 8.0L/min (normal  2.8-8.8 L/min) Indexed volumes: CI: 3.4L/min/sq-m (normal 1.7-4.2 L/min/sq-m) RV EDV: 86mL/sq-m (normal 58-109 mL/sq-m) RV ESV: 21mL/sq-m (normal 12-46 mL/sq-m) RV SV: 62mL/sq-m (normal 38-71 mL/sq-m) ATRIA Left atrium: Mildly enlarged with normal morphology. Right atrium: Mildly enlarged with normal morphology. VALVES Aortic valve: Trileaflet without significant stenosis or regurgitation. Mitral valve: Normal morphology with mild MR. Tricuspid valve: Trivial TR, normal morphology Pulmonic valve: Moderate centrally directed pulmonic regurgitation. Aorta: Mild dilation of the ascending thoracic aorta measuring 4.1 cm. PERICARDIUM Normal pericardium.  No pericardial effusion. OTHER There is a pulmonary nodule seen in the right middle lobe of the lung. Recommend dedicated CT imaging of the chest for further evaluation. Electronically Signed   By: Georganna Archer   On: 10/29/2023 20:11   MR CARDIAC VELOCITY FLOW MAP Result Date: 10/29/2023 CLINICAL DATA:  Ventricular tachycardia (VT), sustained EXAM: MR CARDIA MORPHOLOGY WITHOUT AND WITH CONTRAST; MR CARDIAC VELOCITY FLOW MAPPING TECHNIQUE: The patient was scanned on a 1.5 Tesla Siemens magnet. A dedicated cardiac coil was used. Functional imaging was done using TrueFisp sequences. 2,3, and 4 chamber views were done to assess for RWMA's. Modified Simpson's rule using a short axis stack was used to calculate an ejection fraction on a dedicated work [employer]. The patient received 10mL GADAVIST GADOBUTROL 1 MMOL/ML IV SOLN. After 10 minutes inversion recovery sequences were used to assess for infiltration and scar tissue. Phase contrast velocity encoded images obtained x 2. This examination is tailored for evaluation cardiac anatomy and function and provides very limited assessment of noncardiac structures, which are accordingly not evaluated during interpretation. If there is clinical concern for extracardiac pathology, further evaluation with CT  imaging should be considered. FINDINGS: 1. The left ventricle is normal in cavity size and moderate-severe left ventricular hypertrophy most prominent in the septum (2.0 cm). Global systolic function is normal with an LV ejection fraction calculated at 61%. There are no regional wall motion abnormalities. 2. The right ventricle is normal in cavity size and systolic function with a mild increase in RV wall thickness. There are no RV wall motion abnormalities or aneurysms. 3. Both atria are mildly dilated. 4. The aortic valve is trileaflet in morphology. There is no significant aortic valve stenosis or regurgitation. 5.  There is flow acceleration at the LV septum without clear SAM. The gradient through the LVOT was not assessed. There is mild MR and TR. There is moderate PR. 6. Delayed enhancement imaging is abnormal. There is diffuse patchy subendocardial and mid-myocardial enhancement from base to apex with a more prominent focus of mid-myocardial enhancement in the basal lateral wall. The native T1 valve = 1143 and ECV = 40, which are increased. 7. There is no pericardial effusion. The thickness of the pericardium is normal. 8. The ascending thoracic aorta is mildly enlarged at 4.1 cm. 9. Normal pulmonary venous drainage. 10. There is a pulmonary nodule present in the right middle lobe of the lung. Recommend dedicated CT imaging for further evaluation. CONCLUSIONS: 1.  Moderate to severe LVH with preserved LV systolic function. 2. There is diffuse yet subtle late gadolinium enhancement of the subendocardium and mid-myocardium with prolonged T1 and ECV values with a more prominent focus of mid-myocardial LGE in the basal lateral wall. 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 Sarcoidosis is on the differential as well, but seems less likely. 4. Recommend screening for cardiac amyloid to confirm the diagnosis. 5. Recommend dedicated chest CT imaging to better assess the right lung nodule.  MEASUREMENTS: LEFT VENTRICLE Left ventricular chamber size at end diastole: 4.2 cm. Left ventricular septal wall thickness: 2.0 cm. Left ventricular posterior wall thickness: 1.5 cm. Left ventricular maximal wall thickness: 2.0 cm. LV male LV EF: 61% (normal 49-79%) Absolute volumes: LV EDV: (normal 95-215 mL) LV ESV: 65mL (normal 25-85 mL) LV SV: (normal 61-145 mL) CO: 6.7L/min (normal 3.4-7.8 L/min) Indexed volumes: CI: 2.9L/min/sq-m (normal 1.8-4.2 L/min/sq-m) LV EDV: 28mL/sq-m (normal 50-108 mL/sq-m) LV ESV: 3mL/sq-m (normal 11-47 mL/sq-m) LV SV: 91mL/sq-m (normal 33-72 mL/sq-m) Native T1 = 1143 ECV = 40 RIGHT VENTRICLE Normal right ventricular chamber size. Mildly increased RV free wall thickness. RV male RV EF:  56% (normal 51-80%) Absolute volumes: RV EDV: (normal 109-217 mL) RV ESV: 97mL (normal 23-91 mL) RV SV: (normal 71-141 mL) CO: 8.0L/min (normal 2.8-8.8 L/min) Indexed volumes: CI: 3.4L/min/sq-m (normal 1.7-4.2 L/min/sq-m) RV EDV: 60mL/sq-m (normal 58-109 mL/sq-m) RV ESV: 62mL/sq-m (normal 12-46 mL/sq-m) RV SV: 29mL/sq-m (normal 38-71 mL/sq-m) ATRIA Left atrium: Mildly enlarged with normal morphology. Right atrium: Mildly enlarged with normal morphology. VALVES Aortic valve: Trileaflet without significant stenosis or regurgitation. Mitral valve: Normal morphology with mild MR. Tricuspid valve: Trivial TR, normal morphology Pulmonic valve: Moderate centrally directed pulmonic regurgitation. Aorta: Mild dilation of the ascending thoracic aorta measuring 4.1 cm. PERICARDIUM Normal pericardium.  No pericardial effusion. OTHER There is a pulmonary nodule seen in the right middle lobe of the lung. Recommend dedicated CT imaging of the chest for further evaluation. Electronically Signed   By: Georganna Archer   On: 10/29/2023 20:11   CARDIAC CATHETERIZATION Result Date: 10/27/2023 Conclusions: Mild-moderate, nonobstructive coronary artery disease, as detailed below, including 50%  proximal LAD stenosis that is not hemodynamically significant (RFR = 0.91), 50-60% ostial ramus intermedius lesion, and mild luminal irregularities in the LCx and RCA. Normal left ventricular filling pressure (LVEDP 8 mmHg). Recommendations: Continue medical therapy and risk factor modification to prevent progression of disease. Proceed with cardiac MRI and electrophysiology consultation. Gentle post-catheterization hydration in the setting of moderate CKD. Lonni Hanson, MD [facility]  ECHOCARDIOGRAM COMPLETE Result Date: 10/27/2023    ECHOCARDIOGRAM REPORT   Patient Name:   Chris Obrien Date of Exam: 10/27/2023 Medical Rec #:  [id number]  Height:       73.0 in Accession #:    [id number]      Weight:       235.4 lb Date of Birth:  1950-05-12       BSA:          2.306 m Patient Age:    73 years        BP:           127/74 mmHg Patient Gender: M               HR:           74 bpm. Exam Location:  Inpatient Procedure: 2D Echo and Intracardiac Opacification Agent (Both Spectral and Color            Flow Doppler were utilized during procedure). Indications:    Ventricular Tachycardia  History:        Patient has prior history of Echocardiogram examinations.  Sonographer:    Charmaine Gaskins Referring Phys: [id number] DORN FALCON BRANCH IMPRESSIONS  1. Left ventricular ejection fraction, by estimation, is 65 to 70%. The left ventricle has normal function. The left ventricle has no regional wall motion abnormalities. Left ventricular diastolic parameters are consistent with Grade I diastolic dysfunction (impaired relaxation).  2. Right ventricular systolic function is normal. The right ventricular size is normal.  3. Left atrial size was mild to moderately dilated.  4. The mitral valve is grossly normal. No evidence of mitral valve regurgitation. No evidence of mitral stenosis.  5. The aortic valve was not well visualized. Unable to determine aortic valve morphology due to image quality. Aortic valve  regurgitation is not visualized. Aortic valve sclerosis is present, with no evidence of aortic valve stenosis.  6. Both the aortic root and ascending aorta are normal when indexed to BSA, age and sex.  7. The inferior vena cava is normal in size with greater than 50% respiratory variability, suggesting right atrial pressure of 3 mmHg. Comparison(s): A prior study was performed on 09/04/2022. The ejection fraction was estimated at 55-60% with grade I diastolic dysfunction. The right atrium was mildly dilated. The RVSP was 28 mmHg. the aortic root was 40 mm. FINDINGS  Left Ventricle: Left ventricular ejection fraction, by estimation, is 65 to 70%. The left ventricle has normal function. The left ventricle has no regional wall motion abnormalities. Definity contrast agent was given IV to delineate the left ventricular  endocardial borders. The left ventricular internal cavity size was normal in size. There is no left ventricular hypertrophy. Left ventricular diastolic parameters are consistent with Grade I diastolic dysfunction (impaired relaxation). Right Ventricle: The right ventricular size is normal. No increase in right ventricular wall thickness. Right ventricular systolic function is normal. Left Atrium: Left atrial size was mild to moderately dilated. Right Atrium: Right atrial size was normal in size. Pericardium: There is no evidence of pericardial effusion. Mitral Valve: The mitral valve is grossly normal. There is mild calcification of the mitral valve leaflet(s). No evidence of mitral valve regurgitation. No evidence of mitral valve stenosis. Tricuspid Valve: The tricuspid valve is normal in structure. Tricuspid valve regurgitation is not demonstrated. No evidence of tricuspid stenosis. Aortic Valve: The aortic valve was not well visualized. Aortic valve regurgitation is not visualized. Aortic valve sclerosis is present, with no evidence of aortic valve stenosis. Pulmonic Valve: The pulmonic valve was  normal in structure. Pulmonic valve regurgitation is not visualized. No evidence of pulmonic stenosis. Aorta: Both the aortic root and ascending  aorta are normal when indexed to BSA, age and sex. The aortic root and ascending aorta are structurally normal, with no evidence of dilitation. Venous: The inferior vena cava is normal in size with greater than 50% respiratory variability, suggesting right atrial pressure of 3 mmHg. IAS/Shunts: No atrial level shunt detected by color flow Doppler.  LEFT VENTRICLE PLAX 2D LVIDd:         5.10 cm   Diastology LVIDs:         3.40 cm   LV e' medial:    6.99 cm/s LV PW:         1.00 cm   LV E/e' medial:  7.2 LV IVS:        1.00 cm   LV e' lateral:   5.91 cm/s LVOT diam:     2.10 cm   LV E/e' lateral: 8.5 LVOT Area:     3.46 cm  RIGHT VENTRICLE RV Basal diam:  2.50 cm RV Mid diam:    2.50 cm RV S prime:     14.10 cm/s LEFT ATRIUM             Index        RIGHT ATRIUM           Index LA diam:        3.90 cm 1.69 cm/m   RA Area:     14.60 cm LA Vol (A2C):   75.0 ml 32.52 ml/m  RA Volume:   32.10 ml  13.92 ml/m LA Vol (A4C):   71.8 ml 31.14 ml/m LA Biplane Vol: 77.3 ml 33.52 ml/m   AORTA Ao Root diam: 3.70 cm Ao Asc diam:  3.90 cm MITRAL VALVE MV Area (PHT): 2.42 cm    SHUNTS MV Decel Time: 314 msec    Systemic Diam: 2.10 cm MV E velocity: 50.00 cm/s MV A velocity: 88.30 cm/s MV E/A ratio:  0.57 Emeline Calender Electronically signed by Emeline Calender Signature Date/Time: 10/27/2023/2:54:30 PM    Final    DG Chest Portable 1 View Result Date: 10/24/2023 EXAM: 1 VIEW(S) XRAY OF THE CHEST 10/24/2023 08:29:00 PM COMPARISON: Chest x-ray 09/02/2022, CT chest 10/04/2023. CLINICAL HISTORY: vtach. Reason for exam: Failed stress test today, vtach Failed stress test today, vtach. FINDINGS: LUNGS AND PLEURA: Low lung volumes. No focal pulmonary opacity. No pulmonary edema. No pleural effusion. No pneumothorax. HEART AND MEDIASTINUM: Unchanged  cardiac and mediastinal silhouettes. BONES AND  SOFT TISSUES: Partially visualized bilateral total reverse shoulder arthroplasties. No acute osseous abnormality. IMPRESSION: 1. No acute cardiopulmonary process. Electronically signed by: Morgane Naveau MD 10/24/2023 08:58 PM EDT RP Workstation: [id number]   ECHOCARDIOGRAM STRESS TEST Result Date: 10/24/2023     EXERCISE STRESS ECHO REPORT    -------------------------------------------------------------------------------- Patient Name:   Chris Obrien Date of Exam: 10/24/2023 Medical Rec #:  [id number]       Height:       73.0 in Accession #:    [id number]      Weight:       232.0 lb Date of Birth:  1950-10-17       BSA:          2.292 m Patient Age:    73 years        BP:           148/77 mmHg Patient Gender: M               HR:           92  bpm. Exam Location:  [address] Procedure: Limited Echo, Cardiac Doppler, Limited Color Doppler and Intracardiac            Opacification Agent                           MODIFIED REPORT: This report was modified by Vina Gull MD on 10/24/2023 due to Amend.  Indications:     R06.09 Dyspnea  History:         Patient has prior history of Echocardiogram examinations, most                  recent 08/15/2022.  Sonographer:     Waldo Guadalajara RCS Referring Phys:  MELFORD KARLYNN GAILS PLOTNIKOV Diagnosing Phys: Vina Gull MD IMPRESSIONS  1. Echo showed normal LVEF at baseline After peak exercise, LVEF had hyperdynamic response with no new wall motion abnormalies.  2. Patient developed sustained VT that terminated spontaneously. Electrically nondiagnostic.  3. This is an inconclusive stress echocardiogram for ischemia, given VT. FINDINGS Exam Protocol: The patient exercised on a treadmill according to a Bruce protocol.  Patient Performance: The patient exercised for 1 minute and 17 seconds, achieving 2.9 METS. The baseline heart rate was 88 bpm. The heart rate at peak stress was 136 bpm. The target heart rate was calculated to be 123 bpm. The percentage of maximum predicted heart rate  achieved was 94.0 %. The baseline blood pressure was 148/77 mmHg. The blood pressure at peak stress was 139/83 mmHg. The patient developed shortness of breath during the stress exam. The symptoms resolved with rest.  EKG: Resting EKG showed normal sinus rhythm with T wave inversion. The patient developed sustained ventricular tachycardia during exercise.  2D Echo Findings: The baseline ejection fraction was 60%. The peak ejection fraction at stress was 80%. Baseline regional wall motion abnormalities were not present.  Vina Gull MD Electronically signed on 10/24/2023 at 6:39:16 PM    Final (Updated)    CT Angio Chest PE W and/or Wo Contrast Result Date: 10/04/2023 CLINICAL DATA:  Clinical concern for pulmonary embolism. EXAM: CT ANGIOGRAPHY CHEST WITH CONTRAST TECHNIQUE: Multidetector CT imaging of the chest was performed using the standard protocol during bolus administration of intravenous contrast. Multiplanar CT image reconstructions and MIPs were obtained to evaluate the vascular anatomy. RADIATION DOSE REDUCTION: This exam was performed according to the departmental dose-optimization program which includes automated exposure control, adjustment of the mA and/or kV according to patient size and/or use of iterative reconstruction technique. CONTRAST:  75mL OMNIPAQUE  IOHEXOL  350 MG/ML SOLN COMPARISON:  09/05/2022. FINDINGS: Cardiovascular: The heart is mildly enlarged in there is no pericardial effusion. There is mild atherosclerotic calcification of the aorta with aneurysmal dilatation of the ascending aorta measuring 4.2 cm. The pulmonary trunk is mildly distended suggesting underlying pulmonary artery hypertension. No pulmonary embolism is seen. Mediastinum/Nodes: No mediastinal or hilar lymphadenopathy bilaterally. Enlarged lymph nodes are seen in the axillary regions bilaterally. The thyroid  gland and trachea are within normal limits. There is a small hiatal hernia. Lungs/Pleura: Mild atelectasis is seen  bilaterally. No consolidation, effusion, or pneumothorax. There is a stable 5 mm nodule in the right middle lobe, axial image 53. Upper Abdomen: Fatty infiltration of the liver is noted. No acute abnormality. Musculoskeletal: Arthroplasty changes are noted at the shoulders bilaterally. Degenerative changes are present in the thoracic spine. No acute osseous abnormality. Review of the MIP images confirms the above findings. IMPRESSION: 1. No evidence of pulmonary  embolism or other acute process. 2. Bilateral axillary lymphadenopathy the, not significantly changed from the prior exam. 3. 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 Recommend annual imaging followup by CTA or MRA. This recommendation follows 2010 ACCF/AHA/AATS/ACR/ASA/SCA/SCAI/SIR/STS/SVM Guidelines for the Diagnosis and Management of Patients with Thoracic Aortic Disease. Circulation. 2010; 121: Z733-z630. Aortic aneurysm NOS (ICD10-I71.9) 4. Hepatic steatosis. Electronically Signed   By: Leita Birmingham M.D.   On: 10/04/2023 17:51   CT HEAD WO CONTRAST Result Date: 10/04/2023 EXAM: CT HEAD WITHOUT CONTRAST 10/04/2023 05:14:41 PM TECHNIQUE: CT of the head was performed without the administration of intravenous contrast. Automated exposure control, iterative reconstruction, and/or weight based adjustment of the mA/kV was utilized to reduce the radiation dose to as low as reasonably achievable. COMPARISON: CT head without contrast 10/06/2012. CLINICAL HISTORY: Syncope/presyncope, cerebrovascular cause suspected. FINDINGS: BRAIN AND VENTRICLES: Atherosclerotic calcifications are present in the cavernous carotid arteries bilaterally. No hyperdense vessel is present. No acute hemorrhage. No evidence of acute infarct. No hydrocephalus. No extra-axial collection. No mass effect or midline shift. ORBITS: No acute abnormality. SINUSES: Chronic opacification of the right maxillary sinus is noted. Minimal fluid is  present in the right sphenoid sinus. SOFT TISSUES AND SKULL: No acute soft tissue abnormality. No skull fracture. IMPRESSION: 1. No acute intracranial abnormality related to the clinical history of syncope/presyncope. Electronically signed by: Lonni Necessary MD 10/04/2023 05:21 PM EDT RP Workstation: [id number]    Disposition Pt is being discharged home today in good condition.  Follow-up Plans & Appointments  Follow-up Information     [facility] Follow up on 11/17/2023.   Specialty: Cardiology Why: [facility] 9:30 AM Entrance C Contact information: [address] [place]  [address] [phone]        Plotnikov, Karlynn GAILS, MD Follow up.   Specialty: Internal Medicine Why: Please schedule hospital follow up appt in 7-14 days Contact information: [address] [place] [address] [phone]                Discharge Instructions     Diet - low sodium heart healthy   Complete by: As directed    Discharge instructions   Complete by: As directed    No driving until seen in follow up.  Following heart catheterization, no lifting over 5 lbs for 1 week. No sexual activity for 1 week. Keep procedure site clean & dry. If you notice increased pain, swelling, bleeding or pus, call/return!  You may shower, but no soaking baths/hot tubs/pools for 1 week.   Increase activity slowly   Complete by: As directed    No wound care   Complete by: As directed        Discharge Medications Allergies as of 11/01/2023       Reactions   Amoxicillin  Hives   Quinolones Other (See Comments)   Ascending thoracic aortic aneurysm    Vancomycin  Rash   Severe redness all over body with swollen ankles and hands   Lactose Intolerance (gi) Other (See Comments)   Red on skin   Oxycodone  Rash   Penicillins Itching, Rash   Tramadol  Itching   2019 Shoulder post-op        Medication List      TAKE these medications    amiodarone 200 MG tablet Commonly known as: PACERONE Take 2 tablets (400 mg total) by mouth 2 (two) times daily for 7 days, THEN 1 tablet (200  mg total) 2 (two) times daily for 7 days, THEN 1 tablet (200 mg total) daily. Start taking on: November 01, 2023   aspirin  EC 81 MG tablet Take 1 tablet (81 mg total) by mouth daily. Swallow whole. Start taking on: November 02, 2023   atorvastatin 40 MG tablet Commonly known as: LIPITOR Take 1 tablet (40 mg total) by mouth daily. Start taking on: November 02, 2023   B complex-vitamin C-folic acid  1 MG tablet Take 1 tablet by mouth daily with breakfast.   celecoxib  200 MG capsule Commonly known as: CeleBREX  Take 1-2 capsules (200-400 mg total) by mouth daily as needed. What changed:  how much to take when to take this   diltiazem 120 MG 24 hr capsule Commonly known as: CARDIZEM CD Take 1 capsule (120 mg total) by mouth every 12 (twelve) hours.   famotidine  20 MG tablet Commonly known as: PEPCID  Take 20 mg by mouth at bedtime.   ferrous sulfate  325 (65 FE) MG tablet Take 325 mg by mouth daily with breakfast.   Melatonin 10-10 MG Tbcr Take 10 mg by mouth at bedtime as needed (sleep issues). Bedtime   Multi-Vitamin tablet Take 1 tablet by mouth daily. [employer] Men's 50+   mycophenolate 500 MG tablet Commonly known as: CELLCEPT Take 500 mg by mouth 2 (two) times daily.   nitroGLYCERIN 0.4 MG SL tablet Commonly known as: NITROSTAT Place 1 tablet (0.4 mg total) under the tongue every 5 (five) minutes x 3 doses as needed for chest pain.   omeprazole  40 MG capsule Commonly known as: PRILOSEC Take 1 capsule (40 mg total) by mouth 2 (two) times daily before a meal for 30 days, THEN 1 capsule (40 mg total) daily. Start taking on: Jun 03, 2023   predniSONE  10 MG tablet Commonly known as: DELTASONE  Take 3 tablets (30 mg total) by mouth daily with breakfast. Start taking on: November 02, 2023   Rogaine Mens  5 % Foam Generic drug: Minoxidil Apply 1 Application topically 4 (four) times a week.   sulfamethoxazole -trimethoprim  800-160 MG tablet Commonly known as: BACTRIM  DS Take 1 tablet by mouth 3 (three) times a week. Start taking on: November 03, 2023   tamsulosin  0.4 MG Caps capsule Commonly known as: FLOMAX  Take 0.8 mg by mouth in the morning.   testosterone  cypionate 200 MG/ML injection Commonly known as: DEPOTESTOSTERONE CYPIONATE INJECT 0.5ML (100MG  TOTAL) INTO THE MUSCLE EVERY 7 DAYS - SINGLE USE VIAL - CONTENTS MUST BE DISCARDED AFTER FIRST USE   tirzepatide  2.5 MG/0.5ML injection vial Commonly known as: ZEPBOUND  Inject 2.5 mg into the skin once a week.   Vitamin D3 50 MCG (2000 UT) capsule Take 2 capsules (4,000 Units total) by mouth daily. What changed: how much to take         Outstanding Labs/Studies  AHF for PET   Signed, Jon Nat Hails, PA 11/01/2023, 12:21 PM  I have seen, examined the patient, and reviewed the above assessment and plan.    Hospital Course: Patient was sent to ED for VT during stress test.  He underwent left heart cath which showed nonobstructive CAD.  He underwent cardiac MRI which showed diffuse and somewhat subtle patchy mid wall is endocardial enhancement.  PYP scan was performed and was felt not to be consistent with cardiac amyloidosis.  Cardiac sarcoidosis had not been ruled out.  Unable to obtain inpatient cardiac PET.  He was started on treatment with amiodarone for ventricular tachycardia in the setting of nonischemic cardiomyopathy.  VT suppressed with amiodarone.  He was discharged with plans to complete workup as outpatient.  General: Well developed, in no acute distress.  Neck: No JVD.  Cardiac: Normal rate, regular rhythm.  Resp: Normal work of breathing.  Ext: No edema.  Neuro: No gross focal deficits.  Psych: Normal affect.  Skin: Rash across lower chest  Assessment and Plan: 73 y.o. 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 Admitted for workup after episode of sustained VT during exercise stress echo.    #. Non-ischemic cardiomyopathy: Diffuse and somewhat subtle patchy mid-wall and subendocardial LGE from base=>apex, there was more prominent mid-wall LGE in the basal inferolateral wall, ECV 40%. Broad differential. PYP scan preliminary read negative by one of my colleagues. Fabry's and sarcoidosis are also being considered, espectially in presence of cutaneous rash. He is on immunosuppression which may cause false negative on sarcoid PET.  - ACE level WNL - Myeloma studies pending  - Cannot get cardiac PET for inflammatory evaluation as inpatient.  Regardless, this is going to be confounded by the fact that he has been on methotrexate and then mycophenolate mofetil.  Can consider as outpatient.  - He should get genetic testing to look for HCM and Fabry's as well as genes associated with arrhythmogenic cardiomyopathy as outpatient (I think arrhythmogenic cardiomyopathy may be less likely).  I will also check alpha-galactosidase activity for Fabry's.  - If PYP and skin biopsy are unrevealing, consider endomyocardial biopsy as next step.  - Started on prednisone  30 mg daily (this is the steroid initiation dose for treatment of cardiac sarcoidosis) given concern for inflammatory disease and ongoing ventricular arrhythmias. Will need bactrim  and GI prophylaxis on discharge.  -Follow up with Dr. Rolan in clinic.    #. Ventricular tachycardia: Suspect this is related to some inflammatory process within the myocardium based on MRI findings. He has no obstructive CAD so ischemia is excluded. If no definitive diagnosis for his cardiomyopathy, could be idiopathic VT. Has been responsive to amiodarone. -Continue amiodarone. Oral load 400mg  BID for another 7 days then 200mg  BID for 7 days then 200mg  once daily.  -Continue  diltiazem 120mg  daily. -Follow up with Dr. Inocencio in clinic.    #. Chest wall rash: Bullous rash.  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  This can be primary or secondary to rheumatoid arthritis or metallic prosthesis.  Patient has a prosthetic shoulder joint.  This is a rare condition, but can be treated with immunosuppression. He has been on methotrexate + hydroxychloroquine initially, now on mycophenolate mofetil. ESR elevated at 48, CRP elevated at 11.  ANA, RF, anti-CCP ab all checked at [facility] in the past and negative.  Based on past workup, this does not seem to be a sarcoidosis-type skin lesion. Patient had recent CTA chest with on hilar or mediastinal lymphadenopathy and no lung parenchymal changes consistent with sarcoidosis.  - He will continue MMF for now.  - We repeated the skin biopsy to see if there is any evidence for a unifying diagnosis with the cardiomyopathy.    Duration of discharge encounter 45 minutes.  Fonda Kitty, MD 11/09/2023 9:07 PM

## 2023-11-01 NOTE — Progress Notes (Signed)
 Progress Note  Patient Name: Chris Obrien Date of Encounter: 11/01/2023 [facility] Cardiologist: Jerel Balding, MD   Interval Summary   PYP scan yesterday. Preliminary read was negative. No acute overnight events. Patient reports feeling relatively well. No new or acute complaints.   Vital Signs Vitals:   10/31/23 1628 10/31/23 2052 11/01/23 0120 11/01/23 0457  BP: 129/67 130/74 119/60 120/68  Pulse: 61 62 62 (!) 57  Resp: 17 16 20 16   Temp: 98 F (36.7 C) 98.6 F (37 C) 98.4 F (36.9 C) 97.8 F (36.6 C)  TempSrc: Oral Oral Oral Oral  SpO2: 93% 95% 98% 97%  Weight:      Height:        Intake/Output Summary (Last 24 hours) at 11/01/2023 0948 Last data filed at 10/31/2023 2115 Gross per 24 hour  Intake 834 ml  Output --  Net 834 ml      10/25/2023   12:59 PM 10/24/2023    8:26 PM 10/07/2023    3:04 PM  Last 3 Weights  Weight (lbs) 235 lb 6.4 oz 230 lb 232 lb  Weight (kg) 106.777 kg 104.327 kg 105.235 kg      Telemetry/ECG  SR, no VAs - Personally Reviewed  Physical Exam  General: Well developed, in no acute distress.  Neck: No JVD.  Cardiac: Normal rate, regular rhythm.  Resp: Normal work of breathing.  Ext: No edema.  Neuro: No gross focal deficits.  Psych: Normal affect.   Assessment & Plan  73 y.o. 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 Admitted for workup after episode of sustained VT during exercise stress echo.   #. Non-ischemic cardiomyopathy: Diffuse and somewhat subtle patchy mid-wall and subendocardial LGE from base=>apex, there was more prominent mid-wall LGE in the basal inferolateral wall, ECV 40%. Broad differential. PYP scan preliminary read negative by one of my colleagues. Fabry's and sarcoidosis are also being considered, espectially in presence of cutaneous rash. He is on immunosuppression which may cause false negative on  sarcoid PET.  - ACE level WNL - Myeloma studies pending  - Cannot get cardiac PET for inflammatory evaluation as inpatient.  Regardless, this is going to be confounded by the fact that he has been on methotrexate and then mycophenolate mofetil.  Can consider as outpatient.  - He should get genetic testing to look for HCM and Fabry's as well as genes associated with arrhythmogenic cardiomyopathy as outpatient (I think arrhythmogenic cardiomyopathy may be less likely).  I will also check alpha-galactosidase activity for Fabry's.  - If PYP and skin biopsy are unrevealing, consider endomyocardial biopsy as next step.  - Started on prednisone  30 mg daily (this is the steroid initiation dose for treatment of cardiac sarcoidosis) given concern for inflammatory disease and ongoing ventricular arrhythmias. Will need bactrim  and GI prophylaxis on discharge.  -Follow up with Dr. Rolan in clinic.   #. Ventricular tachycardia: Suspect this is related to some inflammatory process within the myocardium based on MRI findings. He has no obstructive CAD so ischemia is excluded. If no definitive diagnosis for his cardiomyopathy, could be idiopathic VT. Has been responsive to amiodarone. -Continue amiodarone. Oral load 400mg  BID for another 7 days then 200mg  BID for 7 days then 200mg  once daily.  -Continue diltiazem 120mg  daily. -Follow up with Dr. Inocencio in clinic.   #. Chest wall rash: Bullous rash.  Skin biopsy and axillary lymph node biopsy at [facility] were consistent  with intravascular histiocytosis.  This can be primary or secondary to rheumatoid arthritis or metallic prosthesis.  Patient has a prosthetic shoulder joint.  This is a rare condition, but can be treated with immunosuppression. He has been on methotrexate + hydroxychloroquine initially, now on mycophenolate mofetil. ESR elevated at 48, CRP elevated at 11.  ANA, RF, anti-CCP ab all checked at [facility] in the past and negative.  Based on past workup, this does  not seem to be a sarcoidosis-type skin lesion. Patient had recent CTA chest with on hilar or mediastinal lymphadenopathy and no lung parenchymal changes consistent with sarcoidosis.  - He will continue MMF for now.  - We repeated the skin biopsy to see if there is any evidence for a unifying diagnosis with the cardiomyopathy.    Signed, Fonda Kitty, MD

## 2023-11-01 NOTE — Plan of Care (Signed)
  Problem: Education: Goal: Understanding of cardiac disease, CV risk reduction, and recovery process will improve Outcome: Adequate for Discharge Goal: Individualized Educational Video(s) Outcome: Adequate for Discharge   Problem: Activity: Goal: Ability to tolerate increased activity will improve Outcome: Adequate for Discharge   Problem: Cardiac: Goal: Ability to achieve and maintain adequate cardiovascular perfusion will improve Outcome: Adequate for Discharge   Problem: Health Behavior/Discharge Planning: Goal: Ability to safely manage health-related needs after discharge will improve Outcome: Adequate for Discharge   Problem: Education: Goal: Knowledge of General Education information will improve Description: Including pain rating scale, medication(s)/side effects and non-pharmacologic comfort measures Outcome: Adequate for Discharge   Problem: Health Behavior/Discharge Planning: Goal: Ability to manage health-related needs will improve Outcome: Adequate for Discharge   Problem: Clinical Measurements: Goal: Ability to maintain clinical measurements within normal limits will improve Outcome: Adequate for Discharge Goal: Will remain free from infection Outcome: Adequate for Discharge Goal: Diagnostic test results will improve Outcome: Adequate for Discharge Goal: Respiratory complications will improve Outcome: Adequate for Discharge Goal: Cardiovascular complication will be avoided Outcome: Adequate for Discharge   Problem: Activity: Goal: Risk for activity intolerance will decrease Outcome: Adequate for Discharge   Problem: Nutrition: Goal: Adequate nutrition will be maintained Outcome: Adequate for Discharge   Problem: Coping: Goal: Level of anxiety will decrease Outcome: Adequate for Discharge   Problem: Elimination: Goal: Will not experience complications related to bowel motility Outcome: Adequate for Discharge Goal: Will not experience complications  related to urinary retention Outcome: Adequate for Discharge   Problem: Pain Managment: Goal: General experience of comfort will improve and/or be controlled Outcome: Adequate for Discharge   Problem: Safety: Goal: Ability to remain free from injury will improve Outcome: Adequate for Discharge   Problem: Skin Integrity: Goal: Risk for impaired skin integrity will decrease Outcome: Adequate for Discharge   Problem: Education: Goal: Understanding of CV disease, CV risk reduction, and recovery process will improve Outcome: Adequate for Discharge Goal: Individualized Educational Video(s) Outcome: Adequate for Discharge   Problem: Activity: Goal: Ability to return to baseline activity level will improve Outcome: Adequate for Discharge   Problem: Cardiovascular: Goal: Ability to achieve and maintain adequate cardiovascular perfusion will improve Outcome: Adequate for Discharge Goal: Vascular access site(s) Level 0-1 will be maintained Outcome: Adequate for Discharge   Problem: Health Behavior/Discharge Planning: Goal: Ability to safely manage health-related needs after discharge will improve Outcome: Adequate for Discharge

## 2023-11-02 ENCOUNTER — Ambulatory Visit (HOSPITAL_COMMUNITY): Payer: Self-pay | Admitting: Cardiology

## 2023-11-02 ENCOUNTER — Other Ambulatory Visit (HOSPITAL_COMMUNITY): Payer: Self-pay

## 2023-11-03 ENCOUNTER — Telehealth: Payer: Self-pay

## 2023-11-03 ENCOUNTER — Other Ambulatory Visit (HOSPITAL_COMMUNITY): Payer: Self-pay

## 2023-11-03 LAB — MULTIPLE MYELOMA PANEL, SERUM
Albumin SerPl Elph-Mcnc: 2.4 g/dL — ABNORMAL LOW (ref 2.9–4.4)
Albumin/Glob SerPl: 0.8 (ref 0.7–1.7)
Alpha 1: 0.4 g/dL (ref 0.0–0.4)
Alpha2 Glob SerPl Elph-Mcnc: 1 g/dL (ref 0.4–1.0)
B-Globulin SerPl Elph-Mcnc: 0.9 g/dL (ref 0.7–1.3)
Gamma Glob SerPl Elph-Mcnc: 0.9 g/dL (ref 0.4–1.8)
Globulin, Total: 3.3 g/dL (ref 2.2–3.9)
IgA: 277 mg/dL (ref 61–437)
IgG (Immunoglobin G), Serum: 1036 mg/dL (ref 603–1613)
IgM (Immunoglobulin M), Srm: 34 mg/dL (ref 15–143)
Total Protein ELP: 5.7 g/dL — ABNORMAL LOW (ref 6.0–8.5)

## 2023-11-03 NOTE — Transitions of Care (Post Inpatient/ED Visit) (Unsigned)
   11/03/2023  Name: Chris Obrien MRN: [id number] DOB: 1950/09/29  Today's TOC FU Call Status: Today's TOC FU Call Status:: Unsuccessful Call (1st Attempt) Unsuccessful Call (1st Attempt) Date: 11/03/23  Attempted to reach the patient regarding the most recent Inpatient/ED visit.  Follow Up Plan: Additional outreach attempts will be made to reach the patient to complete the Transitions of Care (Post Inpatient/ED visit) call.   Signature Julian Lemmings, LPN [facility] Nurse Health Advisor Direct Dial [phone]

## 2023-11-04 ENCOUNTER — Telehealth (HOSPITAL_COMMUNITY): Payer: Self-pay

## 2023-11-04 ENCOUNTER — Other Ambulatory Visit: Payer: Self-pay | Admitting: Internal Medicine

## 2023-11-04 ENCOUNTER — Ambulatory Visit: Admitting: Internal Medicine

## 2023-11-04 ENCOUNTER — Ambulatory Visit

## 2023-11-04 ENCOUNTER — Other Ambulatory Visit: Payer: Self-pay

## 2023-11-04 ENCOUNTER — Encounter: Payer: Self-pay | Admitting: Internal Medicine

## 2023-11-04 VITALS — BP 112/62 | HR 66 | Temp 98.3°F | Ht 73.0 in | Wt 228.6 lb

## 2023-11-04 DIAGNOSIS — E291 Testicular hypofunction: Secondary | ICD-10-CM | POA: Diagnosis not present

## 2023-11-04 DIAGNOSIS — M25511 Pain in right shoulder: Secondary | ICD-10-CM

## 2023-11-04 DIAGNOSIS — G8929 Other chronic pain: Secondary | ICD-10-CM | POA: Diagnosis not present

## 2023-11-04 DIAGNOSIS — R55 Syncope and collapse: Secondary | ICD-10-CM

## 2023-11-04 DIAGNOSIS — I5032 Chronic diastolic (congestive) heart failure: Secondary | ICD-10-CM | POA: Diagnosis not present

## 2023-11-04 DIAGNOSIS — I2510 Atherosclerotic heart disease of native coronary artery without angina pectoris: Secondary | ICD-10-CM | POA: Diagnosis not present

## 2023-11-04 DIAGNOSIS — R0609 Other forms of dyspnea: Secondary | ICD-10-CM | POA: Diagnosis not present

## 2023-11-04 DIAGNOSIS — L309 Dermatitis, unspecified: Secondary | ICD-10-CM | POA: Diagnosis not present

## 2023-11-04 LAB — CULTURE, BLOOD (ROUTINE X 2)
Culture: NO GROWTH
Culture: NO GROWTH

## 2023-11-04 NOTE — Transitions of Care (Post Inpatient/ED Visit) (Signed)
   11/04/2023  Name: Chris Obrien MRN: [id number] DOB: 06-29-1950  Today's TOC FU Call Status: Today's TOC FU Call Status:: Unsuccessful Call (1st Attempt) Unsuccessful Call (1st Attempt) Date: 11/03/23  Attempted to reach the patient regarding the most recent Inpatient/ED visit.  Follow Up Plan: No further outreach attempts will be made at this time. We have been unable to contact the patient. Patient already seen in office Signature  Julian Lemmings, LPN [facility] Nurse Health Advisor Direct Dial [phone]

## 2023-11-04 NOTE — Telephone Encounter (Signed)
 Called to confirm/remind patient of their appointment [facility] on 11/05/[facility].   Appointment:   [x] Confirmed  [] Left mess   [] No answer/No voice mail  [] VM Full/unable to leave message  [] Phone not in service  Patient reminded to bring all medications and/or complete list.  Confirmed patient has transportation. Gave directions, instructed to utilize valet parking.

## 2023-11-04 NOTE — Progress Notes (Signed)
 Subjective:  Patient ID: Chris Obrien, male    DOB: 1950-09-23  Age: 73 y.o. MRN: [id number]  CC: Hospitalization Follow-up [facility] follow up 10/17 - 10/25 for ventricular tachycardia and dyspnea upon exertion. Currently on therapy of prednisone  for the next 90 days. 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 The left ventricle is normal in cavity size and moderate-severe left ventricular hypertrophy most prominent in the septum (2.0 cm). Global systolic function is normal with an LV ejection fraction calculated at 61%. There are no regional wall motion abnormalities.   2. The right ventricle is normal in cavity size and systolic function with a mild increase in RV wall thickness. There are no RV wall motion abnormalities or aneurysms.   3. Both atria are mildly dilated.   4. The aortic valve is trileaflet in morphology. There is no significant aortic valve stenosis or regurgitation.   5. There is flow acceleration at the LV septum without clear SAM. The gradient through the LVOT was not assessed. There is mild MR and TR. There is moderate PR.   6. Delayed enhancement imaging is abnormal. There is diffuse patchy subendocardial  and mid-myocardial enhancement from base to apex with a more prominent focus of mid-myocardial enhancement in the basal lateral wall. The native T1 valve = 1143 and ECV = 40, which are increased.   7. There is no pericardial effusion. The thickness of the pericardium is normal.   8. The ascending thoracic aorta is mildly enlarged at 4.1 cm.   9. Normal pulmonary venous drainage.   10. There is a pulmonary nodule present in the right middle lobe of the lung. Recommend dedicated CT imaging for further evaluation.   CONCLUSIONS: 1.  Moderate to severe LVH with preserved LV systolic function.   2. There is diffuse yet subtle late gadolinium enhancement of the subendocardium and mid-myocardium with prolonged T1 and ECV values with a more prominent focus of mid-myocardial LGE in the basal lateral wall. 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 Sarcoidosis is on the differential as well, but seems less likely.   4. Recommend screening for cardiac amyloid to confirm the diagnosis.   5. Recommend dedicated chest CT imaging to better assess the right lung nodule.     Echo 10/27/23:  1. Left ventricular ejection fraction, by estimation, is 65 to 70%. The  left ventricle has normal function. The left ventricle has no regional  wall motion abnormalities. Left ventricular diastolic parameters are  consistent with Grade I diastolic  dysfunction (impaired relaxation).   2. Right ventricular systolic function is normal. The right ventricular  size is normal.   3. Left atrial size was mild to moderately  dilated.   4. The mitral valve is grossly normal. No evidence of mitral valve  regurgitation. No evidence of mitral stenosis.   5. The aortic valve was not well visualized. Unable to determine aortic  valve morphology due to image quality. Aortic valve regurgitation is not  visualized. Aortic valve sclerosis is present, with no evidence of aortic  valve stenosis.   6. Both  the aortic root and ascending aorta are normal when indexed to  BSA, age and sex.   7. The inferior vena cava is normal in size with greater than 50%  respiratory variability, suggesting right atrial pressure of 3 mmHg.      LHC 10/2023: Conclusions: Mild-moderate, nonobstructive coronary artery disease, as detailed below, including 50% proximal LAD stenosis that is not hemodynamically significant (RFR = 0.91), 50-60% ostial ramus intermedius lesion, and mild luminal irregularities in the LCx and RCA. Normal left ventricular filling pressure (LVEDP 8 mmHg).   Recommendations: Continue medical therapy and risk factor modification to prevent progression of disease. Proceed with cardiac MRI and electrophysiology consultation. Gentle post-catheterization hydration in the setting of moderate CKD.   _____________   History of Present Illness   Chris Obrien is a 73 y.o. male with  no prior cardiac history who is being seen 10/24/2023 for the evaluation of VT during stress testing.    Chris Obrien was seen in the ED 9/27 for exertional dizziness associated with SOB.  ED evaluation showed flat troponins, positive d-dimer but no PE on CTA.  Referred to outpatient cardiology followup.   Today underwent stress echo.  On minute into stage I bruce he developed sustained VT.  HR at peak stress 136s from baseline of 88. BP stable throughout. C/o SOB. EF preserved and augmented with stress.   Patient had already gone home and was called by Dr. Okey with instructions to return to the ED for urgent evaluation.   Currently asymptomatic.   Of note he has been evaluated at [facility] for chest rash felt possibly autoimmune in nature.  He had been on immunosuppressant prior to admission.     Hospital Course   Consultants: EP   Sustained VT Occurred during exercise portion of stress test, self-terminated --> sent to the ER for evaluation. K 4.5, Mg 1.8 (replaced).  On further evaluation, he  reports 1 month of exertional dizziness and shortness of breath.  He denied syncope but has felt lightheaded.  Following the VT episode, he proceeded to [facility] which demonstrated no severe obstructive CAD.  Echocardiogram confirmed LVEF 65-70%, no R WMA, grade 1 DD, normal RV size and function, moderate LAE, no significant valvular disease.   EP was consulted and felt not a candidate for ICD given severe skin rash across his chest wall.   He was started on Cardizem  and Toprol  with PO amiodarone  load.      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 Amyloid PYP scan yesterday pending.    Unable to complete cardiac PET for inflammatory evaluation as inpatient.  Regardless, this is going to be confounded by the fact that he has been on methotrexate and then mycophenolate  mofetil.  Can consider as outpatient. Plan for genetic testing to look for HCM and Fabry's as well as genes associated with arrhythmogenic cardiomyopathy as outpatient. Check alpha-galactosidase activity for Fabry's. If PYP and skin biopsy are unrevealing, consider endomyocardial biopsy as next step.  Started on prednisone  30 mg daily (this is the steroid initiation dose for  treatment of cardiac sarcoidosis) given concern for inflammatory disease and ongoing ventricular arrhythmias. Will need bactrim  and GI prophylaxis on discharge.    I have messaged our team to arrange follow up with Dr. Rolan.      CAD LHC with 50% proximal LAD stenosis not significant by RFR, no intervention. Pursue risk factor modification Discharged with ASA and lipitor, PRN NTG     Chronic granulomatous dermatitis Located on chest, on cellcept  at home, lymph node biopsy not suggestive of lymphoma. 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  He has been on methotrexate + hydroxychloroquine initially, now  on mycophenolate  mofetil. ESR elevated at 48, CRP elevated at 11. ANA, RF, anti-CCP ab all checked at [facility] in the past and negative. Based on past workup, this does not seem to be a sarcoidosis-type skin lesion. Patient had recent CTA chest without hilar or mediastinal lymphadenopathy and no lung parenchymal changes consistent with sarcoidosis.  Question if allergic reaction to shoulder prosthesis hardware.      R shoulder prosthesis  Has seen ID, has been off ABX. Planning follow up with surgeon for hardware removal.      Pt was instructed not to drive until seen in follow up. Given VT without known reversible cause, will avoid driving.   Pt seen and examined by Dr. Kennyth and deemed stable for discharge.         Did the patient have an acute coronary syndrome (MI, NSTEMI, STEMI, etc) this admission?:  No                               Did the patient have a percutaneous coronary intervention (stent / angioplasty)?:  No.          The patient will be scheduled for a TOC follow up appointment in 7-14 days.  A message has been sent to the [facility] and Scheduling Pool at the office where the patient should be seen for follow up.  _____________   Discharge Vitals Blood pressure 120/68, pulse (!) 57, temperature 97.8 F (36.6 C), temperature source Oral, resp. rate 16, height 6' 1 (1.854 m), weight 106.8 kg, SpO2 97%.      Filed Weights    10/24/23 2026 10/25/23 1259  Weight: 104.3 kg 106.8 kg      Labs & Radiologic Studies  CBC Recent Labs (last 2 labs)      Recent Labs    10/31/23 0405 11/01/23 0526  WBC 5.8 7.5  HGB 11.1* 11.9*  HCT 35.2* 36.9*  MCV 80.0 79.5*  PLT 422* 458*      Basic Metabolic Panel Recent Labs (last 2 labs)      Recent Labs    10/31/23 0405 11/01/23 0526  NA 134* 134*  K 4.1 4.8  CL 101 99  CO2 24 25  GLUCOSE 127* 123*  BUN 22 20  CREATININE 1.80* 1.75*  CALCIUM  8.0* 8.4*  MG 2.1 2.2      Liver Function Tests Recent Labs (last 2 labs)   No results for input(s): AST, ALT, ALKPHOS, BILITOT, PROT, ALBUMIN in the last 72 hours.   Recent Labs (last 2 labs)  No results for input(s): LIPASE, AMYLASE in the last 72 hours.   High Sensitivity Troponin:   Last Labs        Recent Labs  Lab 10/04/23 1624 10/04/23 1824 10/24/23 2026 10/24/23 2329  TROPONINIHS 53* 54* 78* 82*      Last Labs  No results for input(s): TRNPT in the last 720 hours.    BNP Recent Labs (last 2 labs)  Invalid input(s): POCBNP   Recent Labs (last 2 labs)  No results for input(s): PROBNP in the last 72 hours.    Recent Labs (last 2 labs)  No results for input(s): BNP in the last 72 hours.    D-Dimer Recent Labs (last 2 labs)  No results for input(s): DDIMER in the last 72 hours.   Hemoglobin A1C Recent Labs (last 2 labs)  No results for input(s): HGBA1C in the last 72 hours.   Fasting Lipid Panel Recent Labs (last 2 labs)  No results for input(s): CHOL, HDL, LDLCALC, TRIG, CHOLHDL, LDLDIRECT in the last 72 hours.   Last Labs         Lipoprotein (a)  Date/Time Value Ref Range Status  10/26/2023 04:36 AM 95.3 (H) <75.0 nmol/L Final      Comment:      (NOTE) This test was developed and its performance characteristics determined by [facility]. It has not been cleared or approved by the Food and Drug Administration. Note:  Values greater than or equal to 75.0 nmol/L may       indicate an independent risk factor for CHD,       but must be evaluated with caution when applied       to non-Caucasian populations due to the       influence of genetic factors on Lp(a) across       ethnicities. Performed At: [facility] [address] [place], [place] [id number] Jennette Shorter MD Ey:[id number]        Thyroid  Function Tests  Recent Labs (last 2 labs)  No results for input(s): TSH, T4TOTAL, T3FREE, THYROIDAB in the last 72 hours.   Invalid input(s): FREET3   _____________    Imaging Results  MR CARDIAC MORPHOLOGY W WO CONTRAST Result Date: 10/29/2023 CLINICAL DATA:  Ventricular tachycardia (VT), sustained EXAM: MR CARDIA MORPHOLOGY WITHOUT AND WITH CONTRAST; MR CARDIAC VELOCITY FLOW MAPPING TECHNIQUE: The patient was scanned on a 1.5 Tesla Siemens magnet. A dedicated cardiac coil was used. Functional imaging was done using TrueFisp sequences. 2,3, and 4 chamber views were done to assess for RWMA's. Modified Simpson's rule using a short axis stack was used to calculate an ejection fraction on a dedicated work [employer]. The patient received 10mL GADAVIST  GADOBUTROL  1 MMOL/ML IV SOLN. After 10 minutes inversion recovery sequences were used to assess for infiltration and scar tissue. Phase contrast velocity encoded images obtained x 2. This examination is tailored for evaluation cardiac anatomy and function and provides very limited assessment of noncardiac structures, which are accordingly not evaluated during interpretation. If there is clinical concern for extracardiac pathology, further evaluation with CT imaging should be considered. FINDINGS: 1. The left ventricle is normal in cavity size and moderate-severe left ventricular hypertrophy most prominent in the septum (2.0 cm). Global systolic function is normal with an LV ejection fraction calculated at 61%. There are no regional wall motion abnormalities. 2. The right ventricle is normal in cavity size and systolic function with a mild increase in RV wall thickness. There are no RV wall motion abnormalities or aneurysms. 3. Both atria are mildly dilated. 4. The aortic valve is trileaflet in morphology. There is no significant aortic valve stenosis or regurgitation. 5. There is flow acceleration at the LV septum without clear SAM.  The gradient through the LVOT was not assessed. There is mild MR and TR. There is moderate PR. 6. Delayed enhancement imaging is abnormal. There is diffuse patchy subendocardial and  mid-myocardial enhancement from base to apex with a more prominent focus of mid-myocardial enhancement in the basal lateral wall. The native T1 valve = 1143 and ECV = 40, which are increased. 7. There is no pericardial effusion. The thickness of the pericardium is normal. 8. The ascending thoracic aorta is mildly enlarged at 4.1 cm. 9. Normal pulmonary venous drainage. 10. There is a pulmonary nodule present in the right middle lobe of the lung. Recommend dedicated CT imaging for further evaluation. CONCLUSIONS: 1.  Moderate to severe LVH with preserved LV systolic function. 2. There is diffuse yet subtle late gadolinium enhancement of the subendocardium and mid-myocardium with prolonged T1 and ECV values with a more prominent focus of mid-myocardial LGE in the basal lateral wall. 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 Sarcoidosis is on the differential as well, but seems less likely. 4. Recommend screening for cardiac amyloid to confirm the diagnosis. 5. Recommend dedicated chest CT imaging to better assess the right lung nodule. MEASUREMENTS: LEFT VENTRICLE Left ventricular chamber size at end diastole: 4.2 cm. Left ventricular septal wall thickness: 2.0 cm. Left ventricular posterior wall thickness: 1.5 cm. Left ventricular maximal wall thickness: 2.0 cm. LV male LV EF: 61% (normal 49-79%) Absolute volumes: LV EDV: (normal 95-215 mL) LV ESV: 65mL (normal 25-85 mL) LV SV: (normal 61-145 mL) CO: 6.7L/min (normal 3.4-7.8 L/min) Indexed volumes: CI: 2.9L/min/sq-m (normal 1.8-4.2 L/min/sq-m) LV EDV: 63mL/sq-m (normal 50-108 mL/sq-m) LV ESV: 23mL/sq-m (normal 11-47 mL/sq-m) LV SV: 44mL/sq-m (normal 33-72 mL/sq-m) Native T1 = 1143 ECV = 40 RIGHT VENTRICLE Normal right ventricular chamber size. Mildly increased RV free wall thickness. RV male RV EF:  56% (normal 51-80%) Absolute volumes: RV EDV: (normal 109-217 mL) RV ESV: 97mL (normal 23-91 mL) RV SV: (normal 71-141 mL) CO:  8.0L/min (normal 2.8-8.8 L/min) Indexed volumes: CI: 3.4L/min/sq-m (normal 1.7-4.2 L/min/sq-m) RV EDV: 56mL/sq-m (normal 58-109 mL/sq-m) RV ESV: 79mL/sq-m (normal 12-46 mL/sq-m) RV SV: 45mL/sq-m (normal 38-71 mL/sq-m) ATRIA Left atrium: Mildly enlarged with normal morphology. Right atrium: Mildly enlarged with normal morphology. VALVES Aortic valve: Trileaflet without significant stenosis or regurgitation. Mitral valve: Normal morphology with mild MR. Tricuspid valve: Trivial TR, normal morphology Pulmonic valve: Moderate centrally directed pulmonic regurgitation. Aorta: Mild dilation of the ascending thoracic aorta measuring 4.1 cm. PERICARDIUM Normal pericardium.  No pericardial effusion. OTHER There is a pulmonary nodule seen in the right middle lobe of the lung. Recommend dedicated CT imaging of the chest for further evaluation. Electronically Signed   By: Georganna Archer   On: 10/29/2023 20:11    MR CARDIAC VELOCITY FLOW MAP Result Date: 10/29/2023 CLINICAL DATA:  Ventricular tachycardia (VT), sustained EXAM: MR CARDIA MORPHOLOGY WITHOUT AND WITH CONTRAST; MR CARDIAC VELOCITY FLOW MAPPING TECHNIQUE: The patient was scanned on a 1.5 Tesla Siemens magnet. A dedicated cardiac coil was used. Functional imaging was done using TrueFisp sequences. 2,3, and 4 chamber views were done to assess for RWMA's. Modified Simpson's rule using a short axis stack was used to calculate an ejection fraction on a dedicated work [employer]. The patient received 10mL GADAVIST  GADOBUTROL  1 MMOL/ML IV SOLN. After 10 minutes inversion recovery sequences were used to assess for infiltration and scar tissue. Phase contrast velocity encoded images obtained x 2. This examination is tailored for evaluation cardiac anatomy  and function and provides very limited assessment of noncardiac structures, which are accordingly not evaluated during interpretation. If there is clinical concern for extracardiac pathology, further  evaluation with CT imaging should be considered. FINDINGS: 1. The left ventricle is normal in cavity size and moderate-severe left ventricular hypertrophy most prominent in the septum (2.0 cm). Global systolic function is normal with an LV ejection fraction calculated at 61%. There are no regional wall motion abnormalities. 2. The right ventricle is normal in cavity size and systolic function with a mild increase in RV wall thickness. There are no RV wall motion abnormalities or aneurysms. 3. Both atria are mildly dilated. 4. The aortic valve is trileaflet in morphology. There is no significant aortic valve stenosis or regurgitation. 5. There is flow acceleration at the LV septum without clear SAM. The gradient through the LVOT was not assessed. There is mild MR and TR. There is moderate PR. 6. Delayed enhancement imaging is abnormal. There is diffuse patchy subendocardial and mid-myocardial enhancement from base to apex with a more prominent focus of mid-myocardial enhancement in the basal lateral wall. The native T1 valve = 1143 and ECV = 40, which are increased. 7. There is no pericardial effusion. The thickness of the pericardium is normal. 8. The ascending thoracic aorta is mildly enlarged at 4.1 cm. 9. Normal pulmonary venous drainage. 10. There is a pulmonary nodule present in the right middle lobe of the lung. Recommend dedicated CT imaging for further evaluation. CONCLUSIONS: 1.  Moderate to severe LVH with preserved LV systolic function. 2. There is diffuse yet subtle late gadolinium enhancement of the subendocardium and mid-myocardium with prolonged T1 and ECV values with a more prominent focus of mid-myocardial LGE in the basal lateral wall. 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 Sarcoidosis is on the differential as well, but seems less likely. 4. Recommend screening for cardiac amyloid to confirm the diagnosis. 5. Recommend dedicated chest CT imaging to better assess the right lung  nodule. MEASUREMENTS: LEFT VENTRICLE Left ventricular chamber size at end diastole: 4.2 cm. Left ventricular septal wall thickness: 2.0 cm. Left ventricular posterior wall thickness: 1.5 cm. Left ventricular maximal wall thickness: 2.0 cm. LV male LV EF: 61% (normal 49-79%) Absolute volumes: LV EDV: (normal 95-215 mL) LV ESV: 65mL (normal 25-85 mL) LV SV: (normal 61-145 mL) CO: 6.7L/min (normal 3.4-7.8 L/min) Indexed volumes: CI: 2.9L/min/sq-m (normal 1.8-4.2 L/min/sq-m) LV EDV: 53mL/sq-m (normal 50-108 mL/sq-m) LV ESV: 41mL/sq-m (normal 11-47 mL/sq-m) LV SV: 37mL/sq-m (normal 33-72 mL/sq-m) Native T1 = 1143 ECV = 40 RIGHT VENTRICLE Normal right ventricular chamber size. Mildly increased RV free wall thickness. RV male RV EF:  56% (normal 51-80%) Absolute volumes: RV EDV: (normal 109-217 mL) RV ESV: 97mL (normal 23-91 mL) RV SV: (normal 71-141 mL) CO: 8.0L/min (normal 2.8-8.8 L/min) Indexed volumes: CI: 3.4L/min/sq-m (normal 1.7-4.2 L/min/sq-m) RV EDV: 22mL/sq-m (normal 58-109 mL/sq-m) RV ESV: 62mL/sq-m (normal 12-46 mL/sq-m) RV SV: 102mL/sq-m (normal 38-71 mL/sq-m) ATRIA Left atrium: Mildly enlarged with normal morphology. Right atrium: Mildly enlarged with normal morphology. VALVES Aortic valve: Trileaflet without significant stenosis or regurgitation. Mitral valve: Normal morphology with mild MR. Tricuspid valve: Trivial TR, normal morphology Pulmonic valve: Moderate centrally directed pulmonic regurgitation. Aorta: Mild dilation of the ascending thoracic aorta measuring 4.1 cm. PERICARDIUM Normal pericardium.  No pericardial effusion. OTHER There is a pulmonary nodule seen in the right middle lobe of the lung. Recommend dedicated CT imaging of the chest for further evaluation. Electronically Signed  By: Georganna Archer   On: 10/29/2023 20:11    MR CARDIAC VELOCITY FLOW MAP Result Date: 10/29/2023 CLINICAL DATA:  Ventricular tachycardia (VT), sustained EXAM: MR CARDIA MORPHOLOGY  WITHOUT AND WITH CONTRAST; MR CARDIAC VELOCITY FLOW MAPPING TECHNIQUE: The patient was scanned on a 1.5 Tesla Siemens magnet. A dedicated cardiac coil was used. Functional imaging was done using TrueFisp sequences. 2,3, and 4 chamber views were done to assess for RWMA's. Modified Simpson's rule using a short axis stack was used to calculate an ejection fraction on a dedicated work [employer]. The patient received 10mL GADAVIST  GADOBUTROL  1 MMOL/ML IV SOLN. After 10 minutes inversion recovery sequences were used to assess for infiltration and scar tissue. Phase contrast velocity encoded images obtained x 2. This examination is tailored for evaluation cardiac anatomy and function and provides very limited assessment of noncardiac structures, which are accordingly not evaluated during interpretation. If there is clinical concern for extracardiac pathology, further evaluation with CT imaging should be considered. FINDINGS: 1. The left ventricle is normal in cavity size and moderate-severe left ventricular hypertrophy most prominent in the septum (2.0 cm). Global systolic function is normal with an LV ejection fraction calculated at 61%. There are no regional wall motion abnormalities. 2. The right ventricle is normal in cavity size and systolic function with a mild increase in RV wall thickness. There are no RV wall motion abnormalities or aneurysms. 3. Both atria are mildly dilated. 4. The aortic valve is trileaflet in morphology. There is no significant aortic valve stenosis or regurgitation. 5. There is flow acceleration at the LV septum without clear SAM. The gradient through the LVOT was not assessed. There is mild MR and TR. There is moderate PR. 6. Delayed enhancement imaging is abnormal. There is diffuse patchy subendocardial and mid-myocardial enhancement from base to apex with a more prominent focus of mid-myocardial enhancement in the basal lateral wall. The native T1 valve = 1143 and ECV =  40, which are increased. 7. There is no pericardial effusion. The thickness of the pericardium is normal. 8. The ascending thoracic aorta is mildly enlarged at 4.1 cm. 9. Normal pulmonary venous drainage. 10. There is a pulmonary nodule present in the right middle lobe of the lung. Recommend dedicated CT imaging for further evaluation. CONCLUSIONS: 1.  Moderate to severe LVH with preserved LV systolic function. 2. There is diffuse yet subtle late gadolinium enhancement of the subendocardium and mid-myocardium with prolonged T1 and ECV values with a more prominent focus of mid-myocardial LGE in the basal lateral wall. 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 Sarcoidosis is on the differential as well, but seems less likely. 4. Recommend screening for cardiac amyloid to confirm the diagnosis. 5. Recommend dedicated chest CT imaging to better assess the right lung nodule. MEASUREMENTS: LEFT VENTRICLE Left ventricular chamber size at end diastole: 4.2 cm. Left ventricular septal wall thickness: 2.0 cm. Left ventricular posterior wall thickness: 1.5 cm. Left ventricular maximal wall thickness: 2.0 cm. LV male LV EF: 61% (normal 49-79%) Absolute volumes: LV EDV: (normal 95-215 mL) LV ESV: 65mL (normal 25-85 mL) LV SV: (normal 61-145 mL) CO: 6.7L/min (normal 3.4-7.8 L/min) Indexed volumes: CI: 2.9L/min/sq-m (normal 1.8-4.2 L/min/sq-m) LV EDV: 72mL/sq-m (normal 50-108 mL/sq-m) LV ESV: 71mL/sq-m (normal 11-47 mL/sq-m) LV SV: 44mL/sq-m (normal 33-72 mL/sq-m) Native T1 = 1143 ECV = 40 RIGHT VENTRICLE Normal right ventricular chamber size. Mildly increased RV free wall thickness. RV male RV EF:  56% (normal 51-80%) Absolute volumes:  RV EDV: (normal 109-217 mL) RV ESV: 97mL (normal 23-91 mL) RV SV: (normal 71-141 mL) CO: 8.0L/min (normal 2.8-8.8 L/min) Indexed volumes: CI: 3.4L/min/sq-m (normal 1.7-4.2 L/min/sq-m) RV EDV: 77mL/sq-m (normal 58-109 mL/sq-m) RV ESV: 68mL/sq-m (normal 12-46  mL/sq-m) RV SV: 1mL/sq-m (normal 38-71 mL/sq-m) ATRIA Left atrium: Mildly enlarged with normal morphology. Right atrium: Mildly enlarged with normal morphology. VALVES Aortic valve: Trileaflet without significant stenosis or regurgitation. Mitral valve: Normal morphology with mild MR. Tricuspid valve: Trivial TR, normal morphology Pulmonic valve: Moderate centrally directed pulmonic regurgitation. Aorta: Mild dilation of the ascending thoracic aorta measuring 4.1 cm. PERICARDIUM Normal pericardium.  No pericardial effusion. OTHER There is a pulmonary nodule seen in the right middle lobe of the lung. Recommend dedicated CT imaging of the chest for further evaluation. Electronically Signed   By: Georganna Archer   On: 10/29/2023 20:11    CARDIAC CATHETERIZATION Result Date: 10/27/2023 Conclusions: Mild-moderate, nonobstructive coronary artery disease, as detailed below, including 50% proximal LAD stenosis that is not hemodynamically significant (RFR = 0.91), 50-60% ostial ramus intermedius lesion, and mild luminal irregularities in the LCx and RCA. Normal left ventricular filling pressure (LVEDP 8 mmHg). Recommendations: Continue medical therapy and risk factor modification to prevent progression of disease. Proceed with cardiac MRI and electrophysiology consultation. Gentle post-catheterization hydration in the setting of moderate CKD. Lonni Hanson, MD [facility]   ECHOCARDIOGRAM COMPLETE Result Date: 10/27/2023    ECHOCARDIOGRAM REPORT   Patient Name:   Chris Obrien Date of Exam: 10/27/2023 Medical Rec #:  [id number]       Height:       73.0 in Accession #:    [id number]      Weight:       235.4 lb Date of Birth:  January 19, 1950       BSA:          2.306 m Patient Age:    73 years        BP:           127/74 mmHg Patient Gender: M               HR:           74 bpm. Exam Location:  Inpatient Procedure: 2D Echo and Intracardiac Opacification Agent (Both Spectral and Color            Flow Doppler were  utilized during procedure). Indications:    Ventricular Tachycardia  History:        Patient has prior history of Echocardiogram examinations.  Sonographer:    Charmaine Gaskins Referring Phys: [id number] DORN FALCON BRANCH IMPRESSIONS  1. Left ventricular ejection fraction, by estimation, is 65 to 70%. The left ventricle has normal function. The left ventricle has no regional wall motion abnormalities. Left ventricular diastolic parameters are consistent with Grade I diastolic dysfunction (impaired relaxation).  2. Right ventricular systolic function is normal. The right ventricular size is normal.  3. Left atrial size was mild to moderately dilated.  4. The mitral valve is grossly normal. No evidence of mitral valve regurgitation. No evidence of mitral stenosis.  5. The aortic valve was not well visualized. Unable to determine aortic valve morphology due to image quality. Aortic valve regurgitation is not visualized. Aortic valve sclerosis is present, with no evidence of aortic valve stenosis.  6. Both the aortic root and ascending aorta are normal when indexed to BSA, age and sex.  7. The inferior vena cava is normal in size with greater than  50% respiratory variability, suggesting right atrial pressure of 3 mmHg. Comparison(s): A prior study was performed on 09/04/2022. The ejection fraction was estimated at 55-60% with grade I diastolic dysfunction. The right atrium was mildly dilated. The RVSP was 28 mmHg. the aortic root was 40 mm. FINDINGS  Left Ventricle: Left ventricular ejection fraction, by estimation, is 65 to 70%. The left ventricle has normal function. The left ventricle has no regional wall motion abnormalities. Definity  contrast agent was given IV to delineate the left ventricular  endocardial borders. The left ventricular internal cavity size was normal in size. There is no left ventricular hypertrophy. Left ventricular diastolic parameters are consistent with Grade I diastolic dysfunction (impaired  relaxation). Right Ventricle: The right ventricular size is normal. No increase in right ventricular wall thickness. Right ventricular systolic function is normal. Left Atrium: Left atrial size was mild to moderately dilated. Right Atrium: Right atrial size was normal in size. Pericardium: There is no evidence of pericardial effusion. Mitral Valve: The mitral valve is grossly normal. There is mild calcification of the mitral valve leaflet(s). No evidence of mitral valve regurgitation. No evidence of mitral valve stenosis. Tricuspid Valve: The tricuspid valve is normal in structure. Tricuspid valve regurgitation is not demonstrated. No evidence of tricuspid stenosis. Aortic Valve: The aortic valve was not well visualized. Aortic valve regurgitation is not visualized. Aortic valve sclerosis is present, with no evidence of aortic valve stenosis. Pulmonic Valve: The pulmonic valve was normal in structure. Pulmonic valve regurgitation is not visualized. No evidence of pulmonic stenosis. Aorta: Both the aortic root and ascending aorta are normal when indexed to BSA, age and sex. The aortic root and ascending aorta are structurally normal, with no evidence of dilitation. Venous: The inferior vena cava is normal in size with greater than 50% respiratory variability, suggesting right atrial pressure of 3 mmHg. IAS/Shunts: No atrial level shunt detected by color flow Doppler.  LEFT VENTRICLE PLAX 2D LVIDd:         5.10 cm   Diastology LVIDs:         3.40 cm   LV e' medial:    6.99 cm/s LV PW:         1.00 cm   LV E/e' medial:  7.2 LV IVS:        1.00 cm   LV e' lateral:   5.91 cm/s LVOT diam:     2.10 cm   LV E/e' lateral: 8.5 LVOT Area:     3.46 cm  RIGHT VENTRICLE RV Basal diam:  2.50 cm RV Mid diam:    2.50 cm RV S prime:     14.10 cm/s LEFT ATRIUM             Index        RIGHT ATRIUM           Index LA diam:        3.90 cm 1.69 cm/m   RA Area:     14.60 cm LA Vol (A2C):   75.0 ml 32.52 ml/m  RA Volume:   32.10 ml   13.92 ml/m LA Vol (A4C):   71.8 ml 31.14 ml/m LA Biplane Vol: 77.3 ml 33.52 ml/m   AORTA Ao Root diam: 3.70 cm Ao Asc diam:  3.90 cm MITRAL VALVE MV Area (PHT): 2.42 cm    SHUNTS MV Decel Time: 314 msec    Systemic Diam: 2.10 cm MV E velocity: 50.00 cm/s MV A velocity: 88.30 cm/s MV E/A ratio:  0.57  Emeline Calender Electronically signed by Emeline Calender Signature Date/Time: 10/27/2023/2:54:30 PM    Final     DG Chest Portable 1 View Result Date: 10/24/2023 EXAM: 1 VIEW(S) XRAY OF THE CHEST 10/24/2023 08:29:00 PM COMPARISON: Chest x-ray 09/02/2022, CT chest 10/04/2023. CLINICAL HISTORY: vtach. Reason for exam: Failed stress test today, vtach Failed stress test today, vtach. FINDINGS: LUNGS AND PLEURA: Low lung volumes. No focal pulmonary opacity. No pulmonary edema. No pleural effusion. No pneumothorax. HEART AND MEDIASTINUM: Unchanged  cardiac and mediastinal silhouettes. 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 No acute osseous abnormality. IMPRESSION: 1. No acute cardiopulmonary process. Electronically signed by: Morgane Naveau MD 10/24/2023 08:58 PM EDT RP Workstation: [id number]    ECHOCARDIOGRAM STRESS TEST Result Date: 10/24/2023     EXERCISE STRESS ECHO REPORT    -------------------------------------------------------------------------------- Patient Name:   Chris Obrien Date of Exam: 10/24/2023 Medical Rec #:  [id number]       Height:       73.0 in Accession #:    [id number]      Weight:       232.0 lb Date of Birth:  Feb 01, 1950       BSA:          2.292 m Patient Age:    73 years        BP:           148/77 mmHg Patient Gender: M               HR:           92 bpm. Exam Location:  [address] Procedure: Limited Echo, Cardiac Doppler, Limited Color Doppler and Intracardiac            Opacification Agent                           MODIFIED REPORT: This report was modified by Vina Gull MD on 10/24/2023 due to Amend.  Indications:     R06.09 Dyspnea   History:         Patient has prior history of Echocardiogram examinations, most                  recent 08/15/2022.  Sonographer:     Waldo Guadalajara RCS Referring Phys:  MELFORD KARLYNN Obrien Cleo Villamizar Diagnosing Phys: Vina Gull MD IMPRESSIONS  1. Echo showed normal LVEF at baseline After peak exercise, LVEF had hyperdynamic response with no new wall motion abnormalies.  2. Patient developed sustained VT that terminated spontaneously. Electrically nondiagnostic.  3. This is an inconclusive stress echocardiogram for ischemia, given VT. FINDINGS Exam Protocol: The patient exercised on a treadmill according to a Bruce protocol.  Patient Performance: The patient exercised for 1 minute and 17 seconds, achieving 2.9 METS. The baseline heart rate was 88 bpm. The heart rate at peak stress was 136 bpm. The target heart rate was calculated to be 123 bpm. The percentage of maximum predicted heart rate achieved was 94.0 %. The baseline blood pressure was 148/77 mmHg. The blood pressure at peak stress was 139/83 mmHg. The patient developed shortness of breath during the stress exam. The symptoms resolved with rest.  EKG: Resting EKG showed normal sinus rhythm with T wave inversion. The patient developed sustained ventricular tachycardia during exercise.  2D Echo Findings: The baseline ejection fraction was 60%. The peak ejection fraction at stress was 80%. Baseline regional wall motion abnormalities were not present.  Vina  Ross MD Electronically signed on 10/24/2023 at 6:39:16 PM    Final (Updated)     CT Angio Chest PE W and/or Wo Contrast Result Date: 10/04/2023 CLINICAL DATA:  Clinical concern for pulmonary embolism. EXAM: CT ANGIOGRAPHY CHEST WITH CONTRAST TECHNIQUE: Multidetector CT imaging of the chest was performed using the standard protocol during bolus administration of intravenous contrast. Multiplanar CT image reconstructions and MIPs were obtained to evaluate the vascular anatomy. RADIATION DOSE REDUCTION: This exam was  performed according to the departmental dose-optimization program which includes automated exposure control, adjustment of the mA and/or kV according to patient size and/or use of iterative reconstruction technique. CONTRAST:  75mL OMNIPAQUE  IOHEXOL  350 MG/ML SOLN COMPARISON:  09/05/2022. FINDINGS: Cardiovascular: The heart is mildly enlarged in there is no pericardial effusion. There is mild atherosclerotic calcification of the aorta with aneurysmal dilatation of the ascending aorta measuring 4.2 cm. The pulmonary trunk is mildly distended suggesting underlying pulmonary artery hypertension. No pulmonary embolism is seen. Mediastinum/Nodes: No mediastinal or hilar lymphadenopathy bilaterally. Enlarged lymph nodes are seen in the axillary regions bilaterally. The thyroid  gland and trachea are within normal limits. There is a small hiatal hernia. Lungs/Pleura: Mild atelectasis is seen bilaterally. No consolidation, effusion, or pneumothorax. There is a stable 5 mm nodule in the right middle lobe, axial image 53. Upper Abdomen: Fatty infiltration of the liver is noted. No acute abnormality. Musculoskeletal: Arthroplasty changes are noted at the shoulders bilaterally. Degenerative changes are present in the thoracic spine. No acute osseous abnormality. Review of the MIP images confirms the above findings. IMPRESSION: 1. No evidence of pulmonary embolism or other acute process. 2. Bilateral axillary lymphadenopathy the, not significantly changed from the prior exam. 3. 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 Recommend annual imaging followup by CTA or MRA. This recommendation follows 2010 ACCF/AHA/AATS/ACR/ASA/SCA/SCAI/SIR/STS/SVM Guidelines for the Diagnosis and Management of Patients with Thoracic Aortic Disease. Circulation. 2010; 121: Z733-z630. Aortic aneurysm NOS (ICD10-I71.9) 4. Hepatic steatosis. Electronically Signed   By: Leita Birmingham M.D.   On: 10/04/2023  17:51    CT HEAD WO CONTRAST Result Date: 10/04/2023 EXAM: CT HEAD WITHOUT CONTRAST 10/04/2023 05:14:41 PM TECHNIQUE: CT of the head was performed without the administration of intravenous contrast. Automated exposure control, iterative reconstruction, and/or weight based adjustment of the mA/kV was utilized to reduce the radiation dose to as low as reasonably achievable. COMPARISON: CT head without contrast 10/06/2012. CLINICAL HISTORY: Syncope/presyncope, cerebrovascular cause suspected. FINDINGS: BRAIN AND VENTRICLES: Atherosclerotic calcifications are present in the cavernous carotid arteries bilaterally. No hyperdense vessel is present. No acute hemorrhage. No evidence of acute infarct. No hydrocephalus. No extra-axial collection. No mass effect or midline shift. ORBITS: No acute abnormality. SINUSES: Chronic opacification of the right maxillary sinus is noted. Minimal fluid is present in the right sphenoid sinus. SOFT TISSUES AND SKULL: No acute soft tissue abnormality. No skull fracture. IMPRESSION: 1. No acute intracranial abnormality related to the clinical history of syncope/presyncope. Electronically signed by: Lonni Necessary MD 10/04/2023 05:21 PM EDT RP Workstation: [id number]       Outpatient Medications Prior to Visit  Medication Sig Dispense Refill   amiodarone  (PACERONE ) 200 MG tablet Take 2 tablets (400 mg total) by mouth 2 (two) times daily for 7 days, THEN 1 tablet (200 mg total) 2 (two) times daily for 7 days, THEN 1 tablet (200 mg total) daily. 72 tablet 0   aspirin  EC 81 MG tablet Take 1 tablet (81 mg total) by mouth daily. Swallow  whole. 30 tablet 12   atorvastatin  (LIPITOR) 40 MG tablet Take 1 tablet (40 mg total) by mouth daily. 90 tablet 3   B Complex-C-Folic Acid  (B COMPLEX-VITAMIN C-FOLIC ACID ) 1 MG tablet Take 1 tablet by mouth daily with breakfast. 100 tablet 3   celecoxib  (CELEBREX ) 200 MG capsule Take 1-2 capsules (200-400 mg total) by mouth daily as needed. (Patient  not taking: Reported on 11/11/2023) 180 capsule 0   Cholecalciferol  (VITAMIN D3) 50 MCG (2000 UT) capsule Take 2 capsules (4,000 Units total) by mouth daily. (Patient taking differently: Take 2,000 Units by mouth daily.) 100 capsule 3   diltiazem  (CARDIZEM  CD) 120 MG 24 hr capsule Take 1 capsule (120 mg total) by mouth every 12 (twelve) hours. 90 capsule 3   famotidine  (PEPCID ) 20 MG tablet Take 20 mg by mouth at bedtime.     ferrous sulfate  325 (65 FE) MG tablet Take 325 mg by mouth daily with breakfast.     Melatonin 10-10 MG TBCR Take 10 mg by mouth at bedtime as needed (sleep issues). Bedtime     Minoxidil (ROGAINE MENS) 5 % FOAM Apply 1 Application topically 4 (four) times a week.     Multiple Vitamin (MULTI-VITAMIN) tablet Take 1 tablet by mouth daily. [employer] Men's 50+     mycophenolate  (CELLCEPT ) 500 MG tablet Take 500 mg by mouth 2 (two) times daily.     nitroGLYCERIN  (NITROSTAT ) 0.4 MG SL tablet Place 1 tablet (0.4 mg total) under the tongue every 5 (five) minutes x 3 doses as needed for chest pain. (Patient not taking: Reported on 11/11/2023) 25 tablet 3   omeprazole  (PRILOSEC) 40 MG capsule Take 1 capsule (40 mg total) by mouth 2 (two) times daily before a meal for 30 days, THEN 1 capsule (40 mg total) daily. 90 capsule 6   predniSONE  (DELTASONE ) 10 MG tablet Take 3 tablets (30 mg total) by mouth daily with breakfast. 270 tablet 1   sulfamethoxazole -trimethoprim  (BACTRIM  DS) 800-160 MG tablet Take 1 tablet by mouth 3 (three) times a week. 12 tablet 6   tamsulosin  (FLOMAX ) 0.4 MG CAPS capsule Take 0.8 mg by mouth in the morning.     tirzepatide  (ZEPBOUND ) 2.5 MG/0.5ML injection vial Inject 2.5 mg into the skin once a week. 2 mL 5   testosterone  cypionate (DEPOTESTOSTERONE CYPIONATE) 200 MG/ML injection INJECT 0.5ML (100MG  TOTAL) INTO THE MUSCLE EVERY 7 DAYS - SINGLE USE VIAL - CONTENTS MUST BE DISCARDED AFTER FIRST USE 10 mL 1   No facility-administered medications prior to visit.     ROS: Review of Systems  Constitutional:  Negative for appetite change, fatigue and unexpected weight change.  HENT:  Negative for congestion, nosebleeds, sneezing, sore throat and trouble swallowing.   Eyes:  Negative for itching and visual disturbance.  Respiratory:  Negative for cough.   Cardiovascular:  Negative for chest pain, palpitations and leg swelling.  Gastrointestinal:  Negative for abdominal distention, blood in stool, diarrhea and nausea.  Genitourinary:  Negative for frequency and hematuria.  Musculoskeletal:  Positive for arthralgias. Negative for back pain, gait problem, joint swelling and neck pain.  Skin:  Positive for color change, pallor and rash.  Neurological:  Negative for dizziness, tremors, speech difficulty and weakness.  Psychiatric/Behavioral:  Negative for agitation, dysphoric mood and sleep disturbance. The patient is not nervous/anxious.     Objective:  BP 112/62   Pulse 66   Temp 98.3 F (36.8 C)   Ht 6' 1 (1.854 m)   Wt 228  lb 9.6 oz (103.7 kg)   SpO2 95%   BMI 30.16 kg/m   BP Readings from Last 3 Encounters:  11/11/23 119/75  11/10/23 110/70  11/05/23 (!) 146/64    Wt Readings from Last 3 Encounters:  11/12/23 226 lb (102.5 kg)  11/11/23 228 lb (103.4 kg)  11/10/23 226 lb 9.6 oz (102.8 kg)    Physical Exam Constitutional:      General: He is not in acute distress.    Appearance: He is well-developed.     Comments: NAD  Eyes:     Conjunctiva/sclera: Conjunctivae normal.     Pupils: Pupils are equal, round, and reactive to light.  Neck:     Thyroid : No thyromegaly.     Vascular: No JVD.  Cardiovascular:     Rate and Rhythm: Normal rate and regular rhythm.     Heart sounds: Normal heart sounds. No murmur heard.    No friction rub. No gallop.  Pulmonary:     Effort: Pulmonary effort is normal. No respiratory distress.     Breath sounds: Normal breath sounds. No wheezing or rales.  Chest:     Chest wall: No tenderness.   Abdominal:     General: Bowel sounds are normal. There is no distension.     Palpations: Abdomen is soft. There is no mass.     Tenderness: There is no abdominal tenderness. There is no guarding or rebound.  Musculoskeletal:        General: No tenderness. Normal range of motion.     Cervical back: Normal range of motion.  Lymphadenopathy:     Cervical: No cervical adenopathy.  Skin:    General: Skin is warm and dry.     Findings: No rash.  Neurological:     Mental Status: He is alert and oriented to person, place, and time.     Cranial Nerves: No cranial nerve deficit.     Motor: No abnormal muscle tone.     Coordination: Coordination normal.     Gait: Gait normal.     Deep Tendon Reflexes: Reflexes are normal and symmetric.  Psychiatric:        Behavior: Behavior normal.        Thought Content: Thought content normal.        Judgment: Judgment normal.     Lab Results  Component Value Date   WBC 7.5 11/01/2023   HGB 11.9 (L) 11/01/2023   HCT 36.9 (L) 11/01/2023   PLT 458 (H) 11/01/2023   GLUCOSE 123 (H) 11/01/2023   CHOL 110 10/26/2023   TRIG 49 10/26/2023   HDL 27 (L) 10/26/2023   LDLDIRECT 130.9 12/08/2012   LDLCALC 73 10/26/2023   ALT 22 10/24/2023   AST 21 10/24/2023   NA 134 (L) 11/01/2023   K 4.8 11/01/2023   CL 99 11/01/2023   CREATININE 1.75 (H) 11/01/2023   BUN 20 11/01/2023   CO2 25 11/01/2023   TSH 0.865 10/24/2023   PSA 7.20 (H) 09/29/2023   INR 1.09 06/03/2012   HGBA1C 5.4 05/23/2020    DG Chest Portable 1 View Result Date: 10/24/2023 EXAM: 1 VIEW(S) XRAY OF THE CHEST 10/24/2023 08:29:00 PM COMPARISON: Chest x-ray 09/02/2022, CT chest 10/04/2023. CLINICAL HISTORY: vtach. Reason for exam: Failed stress test today, vtach Failed stress test today, vtach. FINDINGS: LUNGS AND PLEURA: Low lung volumes. No focal pulmonary opacity. No pulmonary edema. No pleural effusion. No pneumothorax. HEART AND MEDIASTINUM: Unchanged  cardiac and mediastinal  silhouettes. BONES AND SOFT  TISSUES: Partially visualized bilateral total reverse shoulder arthroplasties. No acute osseous abnormality. IMPRESSION: 1. No acute cardiopulmonary process. Electronically signed by: Morgane Naveau MD 10/24/2023 08:58 PM EDT RP Workstation: [id number]   ECHOCARDIOGRAM STRESS TEST Result Date: 10/24/2023     EXERCISE STRESS ECHO REPORT    -------------------------------------------------------------------------------- Patient Name:   Chris Obrien Date of Exam: 10/24/2023 Medical Rec #:  [id number]       Height:       73.0 in Accession #:    [id number]      Weight:       232.0 lb Date of Birth:  [birth year]       BSA:          2.292 m Patient Age:    73 years        BP:           148/77 mmHg Patient Gender: M               HR:           92 bpm. Exam Location:  [address] Procedure: Limited Echo, Cardiac Doppler, Limited Color Doppler and Intracardiac            Opacification Agent                           MODIFIED REPORT: This report was modified by Vina Gull MD on 10/24/2023 due to Amend.  Indications:     R06.09 Dyspnea  History:         Patient has prior history of Echocardiogram examinations, most                  recent 08/15/2022.  Sonographer:     Waldo Guadalajara RCS Referring Phys:  MELFORD KARLYNN Obrien Haze Antillon Diagnosing Phys: Vina Gull MD IMPRESSIONS  1. Echo showed normal LVEF at baseline After peak exercise, LVEF had hyperdynamic response with no new wall motion abnormalies.  2. Patient developed sustained VT that terminated spontaneously. Electrically nondiagnostic.  3. This is an inconclusive stress echocardiogram for ischemia, given VT. FINDINGS Exam Protocol: The patient exercised on a treadmill according to a Bruce protocol.  Patient Performance: The patient exercised for 1 minute and 17 seconds, achieving 2.9 METS. The baseline heart rate was 88 bpm. The heart rate at peak stress was 136 bpm. The target heart rate was calculated to be 123 bpm. The percentage of maximum  predicted heart rate achieved was 94.0 %. The baseline blood pressure was 148/77 mmHg. The blood pressure at peak stress was 139/83 mmHg. The patient developed shortness of breath during the stress exam. The symptoms resolved with rest.  EKG: Resting EKG showed normal sinus rhythm with T wave inversion. The patient developed sustained ventricular tachycardia during exercise.  2D Echo Findings: The baseline ejection fraction was 60%. The peak ejection fraction at stress was 80%. Baseline regional wall motion abnormalities were not present.  Vina Gull MD Electronically signed on 10/24/2023 at 6:39:16 PM    Final (Updated)     Assessment & Plan:   Problem List Items Addressed This Visit     CAD (coronary artery disease)   LHC with 50% proximal LAD stenosis not significant by RFR, no intervention. Pursue risk factor modification On  ASA and lipitor, PRN NTG      Chronic diastolic heart failure (HCC)   Due to arrhythmia sustained ventricular tachycardia-resolved.  No need for ICD.  Continue with Cardizem  and Toprol  with  amiodarone .  In general feeling much better on prednisone       Dermatitis   Dermatitis located on chest, on cellcept  at home, lymph node biopsy not suggestive of lymphoma. 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  He has been on methotrexate + hydroxychloroquine initially, now on mycophenolate  mofetil. ESR elevated at 48, CRP elevated at 11. ANA, RF, anti-CCP ab all checked at [facility] in the past and negative. Based on past workup, this does not seem to be a sarcoidosis-type skin lesion. Patient had recent CTA chest without hilar or mediastinal lymphadenopathy and no lung parenchymal changes consistent with sarcoidosis.  Question if allergic reaction to shoulder prosthesis hardware.   Doing much better on 30 mg of prednisone         DOE (dyspnea on exertion) - Primary   Due to arrhythmia sustained ventricular  tachycardia-resolved.  No need for ICD.  Continue with Cardizem  and Toprol  with amiodarone .  In general feeling much better on prednisone  30 mg a day      Hypogonadism in male   We can restart testosterone  injections      Other Visit Diagnoses       Chronic right shoulder pain       Relevant Orders   DG Shoulder Right (Completed)   DG Humerus Right (Completed)         No orders of the defined types were placed in this encounter.     Follow-up: Return in about 6 weeks (around 12/16/2023) for a follow-up visit.  Marolyn Noel, MD

## 2023-11-05 ENCOUNTER — Ambulatory Visit (HOSPITAL_COMMUNITY)
Admission: RE | Admit: 2023-11-05 | Discharge: 2023-11-05 | Disposition: A | Discharge status: Home / Self Care | Source: Ambulatory Visit | Attending: Family Medicine | Admitting: Family Medicine

## 2023-11-05 ENCOUNTER — Telehealth: Payer: Self-pay

## 2023-11-05 ENCOUNTER — Inpatient Hospital Stay (HOSPITAL_COMMUNITY)

## 2023-11-05 ENCOUNTER — Ambulatory Visit (INDEPENDENT_AMBULATORY_CARE_PROVIDER_SITE_OTHER): Admitting: Gastroenterology

## 2023-11-05 ENCOUNTER — Encounter: Payer: Self-pay | Admitting: Gastroenterology

## 2023-11-05 ENCOUNTER — Encounter (HOSPITAL_COMMUNITY): Payer: Self-pay

## 2023-11-05 ENCOUNTER — Ambulatory Visit: Payer: Self-pay | Admitting: Internal Medicine

## 2023-11-05 VITALS — BP 136/62 | HR 59 | Ht 73.0 in | Wt 231.1 lb

## 2023-11-05 VITALS — BP 146/64 | HR 71 | Ht 73.0 in | Wt 231.8 lb

## 2023-11-05 DIAGNOSIS — D8685 Sarcoid myocarditis: Secondary | ICD-10-CM | POA: Diagnosis not present

## 2023-11-05 DIAGNOSIS — R7982 Elevated C-reactive protein (CRP): Secondary | ICD-10-CM | POA: Insufficient documentation

## 2023-11-05 DIAGNOSIS — K2100 Gastro-esophageal reflux disease with esophagitis, without bleeding: Secondary | ICD-10-CM | POA: Diagnosis not present

## 2023-11-05 DIAGNOSIS — R21 Rash and other nonspecific skin eruption: Secondary | ICD-10-CM | POA: Diagnosis not present

## 2023-11-05 DIAGNOSIS — R42 Dizziness and giddiness: Secondary | ICD-10-CM | POA: Diagnosis not present

## 2023-11-05 DIAGNOSIS — Z7952 Long term (current) use of systemic steroids: Secondary | ICD-10-CM | POA: Insufficient documentation

## 2023-11-05 DIAGNOSIS — Z87891 Personal history of nicotine dependence: Secondary | ICD-10-CM | POA: Diagnosis not present

## 2023-11-05 DIAGNOSIS — I5032 Chronic diastolic (congestive) heart failure: Secondary | ICD-10-CM | POA: Insufficient documentation

## 2023-11-05 DIAGNOSIS — I493 Ventricular premature depolarization: Secondary | ICD-10-CM | POA: Diagnosis not present

## 2023-11-05 DIAGNOSIS — I429 Cardiomyopathy, unspecified: Secondary | ICD-10-CM | POA: Insufficient documentation

## 2023-11-05 DIAGNOSIS — I2510 Atherosclerotic heart disease of native coronary artery without angina pectoris: Secondary | ICD-10-CM | POA: Diagnosis not present

## 2023-11-05 DIAGNOSIS — N189 Chronic kidney disease, unspecified: Secondary | ICD-10-CM | POA: Diagnosis not present

## 2023-11-05 DIAGNOSIS — Z79624 Long term (current) use of inhibitors of nucleotide synthesis: Secondary | ICD-10-CM | POA: Insufficient documentation

## 2023-11-05 DIAGNOSIS — Z860101 Personal history of adenomatous and serrated colon polyps: Secondary | ICD-10-CM | POA: Diagnosis not present

## 2023-11-05 DIAGNOSIS — Z79899 Other long term (current) drug therapy: Secondary | ICD-10-CM | POA: Diagnosis not present

## 2023-11-05 DIAGNOSIS — I472 Ventricular tachycardia, unspecified: Secondary | ICD-10-CM | POA: Diagnosis not present

## 2023-11-05 DIAGNOSIS — I7121 Aneurysm of the ascending aorta, without rupture: Secondary | ICD-10-CM | POA: Diagnosis not present

## 2023-11-05 MED ORDER — NA SULFATE-K SULFATE-MG SULF 17.5-3.13-1.6 GM/177ML PO SOLN: 1.0000 | Freq: Once | ORAL | 0 refills | Status: AC

## 2023-11-05 NOTE — Telephone Encounter (Signed)
 [facility] Pre-operative Risk Assessment     Request for surgical clearance:     Endoscopy Procedure  What type of surgery is being performed?     ECL  When is this surgery scheduled?     12/24/23  What type of clearance is required ?   cardiac  Are there any medications that need to be held prior to surgery and how long? No medication. Needing cardiac clearance. Recently admitted to the hospital after ventricular tachycardia.  Practice name and name of physician performing surgery?      [facility]  What is your office phone and fax number?      Phone- [phone]  Fax- [phone]  Anesthesia type (None, local, MAC, general) ?       MAC   Please route your response to Corean Amsterdam, [facility]

## 2023-11-05 NOTE — Progress Notes (Signed)
 ADVANCED HF CLINIC CONSULT NOTE  Primary Care: Plotnikov, Karlynn GAILS, MD Primary Cardiologist: Jerel Balding, MD HF Cardiologist: Dr. Rolan  HPI: 73 y.o. 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 No prior cardiac history until recently.    He's had lightheadedness and shortness of breath with heavy exertion for about a month. Had a near syncopal episode last month and was seen in ED.    Underwent stress echo on 10/24/23. One minute into stage I bruce protocol developed symptomatic sustained VT which terminated spontaneously. Exercise was aborted. BP stable. He was short of breath. EF preserved. He had gone home but was later called by Cardiologist to return to ED for urgent evaluation. He was admitted under Cardiology service for further workup. Cardiac cath with 50% p LAD (not significant by RFR, 0.91), 50-60% ostial ramus.  EP Consulted. He was started on diltiazem CD and metoprolol xl. High suspicion for acute on chronic inflammatory process. cMRI showed LVEF 61%, moderate to severe LVH (septum 2.0 cm) w/o clear SAM, RV function okay, diffuse patchy LGE subendocardial and mid-myocardial LGE from base to apex, ECV 40%. Study suggestive of cardiac amyloidosis vs sarcoidosis. He underwent PYP which showed no evidence of cardiac amyloidosis, C/L ratio 0.82. Multiple myeloma panel negative. Light chains elevated (hx of CKD)  Today he returns for post hospital HF follow up with his wife. Overall feeling great. Rash on chest looks the best it has in months on prednisone . No longer short of breath. Denies palpitations, abnormal bleeding, CP, dizziness, edema, or PND/Orthopnea. Appetite ok. Weight stable. Taking all medications. Has EP and General cards follow up arranged.  ECG (personally  reviewed): NSR 71 bpm  Labs (10/25): K 4.8, creatinine 1.75, SPEP without M spike, LDL 73  Cardiac Studies - PYP scan (10/25): no evidence for cardiac amyloidosis; C/L ratio 0.82 - cMRI (10/25): LVEF 61%, moderate to severe LVH (septum 2.0 cm) w/o clear SAM, RV function okay, diffuse patchy LGE subendocardial and mid-myocardial LGE from base to apex, ECV 40%. Study suggestive of cardiac amyloidosis vs sarcoidosis.  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   Oligospermia    OSA on CPAP    PT LOST  WEIGHT - NO LONGER USING CPAP   Pain    RIGHT SHOULDER DELTOID MUSCLE ATROPHY - LOSS OF STRENGT VERY LIMITED RANGE OF MOTTION   Right knee skin infection    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 72 tablet 0   aspirin  EC  81 MG tablet Take 1 tablet (81 mg total) by mouth daily. Swallow whole. 30 tablet 12   atorvastatin (LIPITOR) 40 MG tablet Take 1 tablet (40 mg total) by mouth daily. 90 tablet 3   B Complex-C-Folic Acid  (B COMPLEX-VITAMIN C-FOLIC ACID ) 1 MG tablet Take 1 tablet by mouth daily with breakfast. 100 tablet 3   celecoxib  (CELEBREX ) 200 MG capsule Take 1-2 capsules (200-400 mg total) by mouth daily as needed. (Patient taking differently: Take 400 mg by mouth at bedtime.) 180 capsule 0   Cholecalciferol (VITAMIN D3) 50 MCG (2000 UT) capsule Take 2 capsules (4,000 Units total) by mouth daily. (Patient taking differently: Take 2,000 Units by mouth daily.) 100 capsule 3   diltiazem (CARDIZEM CD) 120 MG 24 hr capsule Take 1 capsule (120 mg total) by mouth every 12 (twelve) hours. 90 capsule 3   famotidine  (PEPCID ) 20 MG tablet Take 20 mg by mouth at bedtime.     ferrous sulfate  325 (65 FE) MG tablet Take 325 mg by mouth daily with breakfast.     folic acid  (FOLVITE ) 1 MG tablet Take 1 mg by mouth daily.     Melatonin 10-10 MG TBCR Take 10 mg by mouth at bedtime as needed (sleep issues). Bedtime     Minoxidil (ROGAINE MENS) 5 % FOAM Apply 1 Application topically 4 (four) times a week.     Multiple Vitamin (MULTI-VITAMIN) tablet Take 1 tablet by mouth daily. [employer]orne Men's 50+     mycophenolate (CELLCEPT) 500 MG tablet Take 500 mg by mouth 2 (two) times daily.     nitroGLYCERIN (NITROSTAT) 0.4 MG SL tablet Place 1 tablet (0.4 mg total) under the tongue every 5 (five) minutes x 3 doses as needed for chest pain. 25 tablet 3   omeprazole  (PRILOSEC) 40 MG capsule Take 1 capsule (40 mg total) by mouth 2 (two) times daily before a meal for 30 days, THEN 1 capsule (40 mg total) daily. 90 capsule 6   predniSONE  (DELTASONE ) 10 MG tablet Take 3 tablets (30 mg total) by mouth daily with breakfast. 270 tablet 1   sulfamethoxazole -trimethoprim  (BACTRIM  DS) 800-160 MG tablet Take 1 tablet by mouth 3 (three) times a week. 12  tablet 6   tamsulosin  (FLOMAX ) 0.4 MG CAPS capsule Take 0.8 mg by mouth in the morning.     testosterone  cypionate (DEPOTESTOSTERONE CYPIONATE) 200 MG/ML injection INJECT 0.5ML (100MG  TOTAL) INTO THE MUSCLE EVERY 7 DAYS - SINGLE USE VIAL - CONTENTS MUST BE DISCARDED AFTER FIRST USE 10 mL 1   tirzepatide  (ZEPBOUND ) 2.5 MG/0.5ML injection vial Inject 2.5 mg into the skin once a week. 2 mL 5   No current facility-administered medications for this encounter.   Allergies  Allergen Reactions   Amoxicillin  Hives   Quinolones Other (See Comments)    Ascending thoracic aortic aneurysm    Vancomycin  Rash    Severe redness all over body with swollen ankles and hands   Lactose Intolerance (Gi) Other (See Comments)    Red  on skin   Oxycodone  Rash   Penicillins Itching and Rash   Tramadol  Itching    2019 Shoulder post-op   Social History   Socioeconomic History   Marital status: Married    Spouse name: Not on file   Number of children: Not on file   Years of education: 16   Highest education level: Bachelor's degree (e.g., BA, AB, BS)  Occupational History   Occupation: production designer, theatre/television/film    Comment: Marketing Executive   Occupation: MANAGER    Employer: [employer]  Tobacco Use   Smoking status: Former    Types: Cigars    Quit date: 1990    Years since quitting: 35.8   Smokeless tobacco: Never  Vaping Use   Vaping status: Never Used  Substance and Sexual Activity   Alcohol use: Not Currently    Comment: occasionally   Drug use: No   Sexual activity: Yes    Birth control/protection: None  Other Topics Concern   Not on file  Social History Narrative   ** Merged History Encounter **       Caffeine 4 cups avg daily, 4 cans soda.   Social Drivers of Corporate Investment Banker Strain: Low Risk  (08/22/2023)   Overall Financial Resource Strain (CARDIA)    Difficulty of Paying Living Expenses: Not hard at all  Food Insecurity: No Food Insecurity (10/25/2023)   Hunger Vital Sign     Worried About Running Out of Food in the Last Year: Never true    Ran Out of Food in the Last Year: Never true  Transportation Needs: No Transportation Needs (10/25/2023)   PRAPARE - Administrator, Civil Service (Medical): No    Lack of Transportation (Non-Medical): No  Physical Activity: Sufficiently Active (08/22/2023)   Exercise Vital Sign    Days of Exercise per Week: 4 days    Minutes of Exercise per Session: 40 min  Stress: No Stress Concern Present (08/22/2023)   [employer] of Occupational Health - Occupational Stress Questionnaire    Feeling of Stress: Not at all  Social Connections: Moderately Integrated (10/25/2023)   Social Connection and Isolation Panel    Frequency of Communication with Friends and Family: More than three times a week    Frequency of Social Gatherings with Friends and Family: Three times a week    Attends Religious Services: Never    Active Member of Clubs or Organizations: Yes    Attends Banker Meetings: Never    Marital Status: Married  Catering Manager Violence: Not At Risk (10/25/2023)   Humiliation, Afraid, Rape, and Kick questionnaire    Fear of Current or Ex-Partner: No    Emotionally Abused: No    Physically Abused: No    Sexually Abused: No   Family History  Problem Relation Age of Onset   Alzheimer's disease Mother    Arthritis Father    Diabetes Brother    Hyperlipidemia Other    Colon cancer Neg Hx    Colon polyps Neg Hx    Esophageal cancer Neg Hx    Stomach cancer Neg Hx    Rectal cancer Neg Hx    Wt Readings from Last 3 Encounters:  11/05/23 105.1 kg (231 lb 12.8 oz)  11/04/23 103.7 kg (228 lb 9.6 oz)  10/25/23 106.8 kg (235 lb 6.4 oz)   BP (!) 146/64   Pulse 71   Ht 6' 1 (1.854 m)   Wt 105.1 kg (231 lb 12.8  oz)   SpO2 96%   BMI 30.58 kg/m   PHYSICAL EXAM: General:  NAD. No resp difficulty, walked into clinic HEENT: Normal Neck: Supple. No JVD. Cor: Regular rate & rhythm. No rubs,  gallops or murmurs. Lungs: Clear Abdomen: Soft, nontender, nondistended.  Extremities: No cyanosis, clubbing, rash, edema Neuro: Alert & oriented x 3, moves all 4 extremities w/o difficulty. Affect pleasant.  ASSESSMENT & PLAN: 1. Chest wall rash: Bullous rash.  Skin biopsy and axillary lymph node biopsy at [facility] were consistent with intravascular histiocytosis.  This can be primary or secondary to rheumatoid arthritis or metallic prosthesis.  Patient has a prosthetic shoulder joint.  This is a rare condition, but can be treated with immunosuppression. He has been on methotrexate + hydroxychloroquine initially, now on mycophenolate mofetil. ESR elevated at 48, CRP elevated at 11.  ANA, RF, anti-CCP ab all checked at [facility] in the past and negative.  Based on past workup, this does not seem to be a sarcoidosis-type skin lesion. Patient had recent CTA chest with on hilar or mediastinal lymphadenopathy and no lung parenchymal changes consistent with sarcoidosis.  - He will continue MMF for now.  - We repeated the skin biopsy (see below) to see if there is any evidence for a unifying diagnosis with the cardiomyopathy. Path results pending. 2. Cardiomyopathy: Lightheadedness with exertion, found to have VT with stress echo and then PVCs with exertion.  LHC with nonobstructive CAD:  Dr. Rolan reviewed the cardiac MRI, this showed LVEF 61% with moderate LVH (somewhat asymmetric in septum), normal RV size/systolic function, there was diffuse and somewhat subtle patchy mid-wall and subendocardial LGE from base=>apex, there was more prominent mid-wall LGE in the basal inferolateral wall, ECV 40%.  Unfortunately, differential is broad here.  Cardiac amyloidosis is a consideration with the LVH, diffuse and somewhat subtle LGE and elevated ECV, but this type of VT presentation is unusual and he does not have any other signs (peripheral neuropathy, CTS, etc). Cardiac sarcoidosis could also look like this, though prior  biopsies of lymph node and skin did not show sarcoidosis and CT chest is not consistent with pulmonary sarcoidosis. He has been treated with immunosuppression that could be used to treat cardiac sarcoidosis for several months now (methotrexate then mycophenolate mofetil) and still had VT.  LGE pattern not typical for a familial arrhythmogenic cardiomyopathy and no family history.  Hypertrophic cardiomyopathy is a definite consideration though he does not carry a family history (somewhat asymmetric LVH and LGE).  Finally, also should rule out Fabry's with the prominent basal lateral LGE and LVH.  If the skin rash is from intravascular histiocytosis, we do not have a definite way to connect this to the cardiomyopathy.  - We repeated the skin biopsy to reassess the skin diagnosis, is there any sign of sarcoidosis or other unifying diagnosis - ACE level WNL - Myeloma studies without M spike and PYP showed no evidence of cardiac amyloidosis.  - Hold off on cardiac PET for inflammatory evaluation, as  this is going to be confounded by the fact that he has been on methotrexate and then mycophenolate mofetil. - Will send genetic testing today to look for HCM and Fabry's, as well as genes associated with arrhythmogenic cardiomyopathy (suspect arrhythmogenic cardiomyopathy may be less likely). Alpha-galactosidase activity to evaluate for Fabry's has been sent, and is pending.  - Consider endomyocardial biopsy next. - Started on prednisone  30 mg daily (this is the steroid initiation dose for treatment of cardiac sarcoidosis) given  concern for inflammatory disease and ongoing ventricular arrhythmias. Conitnue bactrim  prophylaxis.  3. VT: Has VT and PVCs with exertion.   - On amiodarone and diltiazem per EP.  4. CAD: Nonobstructive on cath. No chest pain. - Continue statin.    This may end up being two separate diagnoses with intravascular histiocytosis as skin lesion and a separate cardiomyopathy.  Send genetic  testing today. Follow up in 6 weeks with Dr. Rolan to discuss next steps (endomyocardial bx +/- cardiac PET).  Harlene Gainer, FNP-BC 11/05/23

## 2023-11-05 NOTE — Patient Instructions (Signed)
 You have been scheduled for an endoscopy and colonoscopy. Please follow the written instructions given to you at your visit today.  If you use inhalers (even only as needed), please bring them with you on the day of your procedure.  DO NOT TAKE 7 DAYS PRIOR TO TEST- Trulicity (dulaglutide) Ozempic, Wegovy (semaglutide) Mounjaro (tirzepatide) Bydureon Bcise (exanatide extended release)  DO NOT TAKE 1 DAY PRIOR TO YOUR TEST Rybelsus (semaglutide) Adlyxin (lixisenatide) Victoza (liraglutide) Byetta (exanatide) ________________________________________________________________________

## 2023-11-05 NOTE — Patient Instructions (Addendum)
 Thank you for coming in today  If you had labs drawn today, any labs that are abnormal the clinic will call you No news is good news  Genetic blood testing was completed today  Medications: No changes   Follow up appointments:  Your physician recommends that you schedule a follow-up appointment in:  12/12/2023 at 10:40 am With Dr. Rolan    Do the following things EVERYDAY: Weigh yourself in the morning before breakfast. Write it down and keep it in a log. Take your medicines as prescribed Eat low salt foods--Limit salt (sodium) to 2000 mg per day.  Stay as active as you can everyday Limit all fluids for the day to less than 2 liters   At the [facility], you and your health needs are our priority. As part of our continuing mission to provide you with exceptional heart care, we have created designated Provider Care Teams. These Care Teams include your primary Cardiologist (physician) and Advanced Practice Providers (APPs- Physician Assistants and Nurse Practitioners) who all work together to provide you with the care you need, when you need it.   You may see any of the following providers on your designated Care Team at your next follow up: Dr Toribio Fuel Dr Ezra Rolan Dr. Ria Gardenia Greig Lenetta, NP Caffie Shed, [place] [facility] Gut[place], [place] Beckey Coe, NP Tinnie Redman, PharmD   Please be sure to bring in all your medications bottles to every appointment.    Thank you for choosing [facility]  If you have any questions or concerns before your next appointment please send us  a message through [place] or call our office at [phone].    TO LEAVE A MESSAGE FOR THE NURSE SELECT OPTION 2, PLEASE LEAVE A MESSAGE INCLUDING: YOUR NAME DATE OF BIRTH CALL BACK NUMBER REASON FOR CALL**this is important as we prioritize the call backs  YOU WILL RECEIVE A CALL BACK THE SAME DAY AS LONG AS YOU  CALL BEFORE 4:00 PM

## 2023-11-05 NOTE — Progress Notes (Signed)
 Chris Obrien [id number] Dec 27, 1950   Chief Complaint: Discuss EGD  Referring Provider: Garald Karlynn GAILS, MD Primary GI MD: Dr. Wilhelmenia  HPI: Chris Obrien is a 73 y.o. male with past medical history of GERD, BPH, dermatitis, OSA on CPAP,  tachycardia who presents today for follow up.    Seen in consult by [facility] 05/2023 for abnormal uptake of the distal esophagus on a PET scan.  Per note also has history of colonoscopy in 2011 which showed polyps and had been using Cologuard annually since then with last test in November 2024 being negative.  EGD is recommended for further evaluation but was upset that EGD was not to be completed that same day, and requested to have procedure done locally in [place].  Transferred care to Dr. Wilhelmenia and underwent EGD 06/03/2023.  Findings included tortuous esophagus, LA grade a esophagitis, 3 cm hiatal hernia, antral gastritis, single duodenal polyp which was resected and retrieved, and duodenitis.  Repeat EGD recommended in 4 months to follow-up on healing of the esophagus.  Dr. Wilhelmenia also recommended follow-up to discuss colon cancer screening based on history of tubular adenomas (Cologuard not appropriate).  Patient recently admitted to the hospital 10/24/2023 to 11/01/2023 for ventricular tachycardia which was seen during stress testing.  Had been seen in the ED 9/27 for exertional dizziness associated with shortness of breath.  ED evaluation showed flat troponins, positive D-dimer, but no PE on CTA.  Was referred to outpatient cardiology. Sustained V. tach occurred during exercise portion of stress test and self terminated.  Was sent to the ER for evaluation.  On further evaluation reported 1 month of exertional dizziness and shortness of breath.  Denied syncope but had felt lightheaded.  Echocardiogram confirmed LVEF of 65 to 70%.   Cardiac catheterization 10/27/2023 showed mild to moderate, nonobstructive CAD. Was felt to not be a  candidate for ICD given severe skin rash across his chest wall.  Was started on Cardizem and Toprol with p.o. amiodarone. 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  Seen today by cardiology for hospital follow-up.   Discussed the use of AI scribe software for clinical note transcription with the patient, who gave verbal consent to proceed.  History of Present Illness USHER HEDBERG Chris Obrien is a 73 year old male who presents for follow-up on previous endoscopic findings and discussion of repeat procedures.  He underwent an upper endoscopy in May, which revealed mild esophagitis. A follow-up endoscopy was recommended in approximately six months to monitor healing. Currently, he has no issues with heartburn, reflux, nausea, or vomiting. He experienced heartburn during a recent hospitalization, which he attributes to medication, but it resolved.  He was recently hospitalized for ventricular tachycardia and is now under cardiology care. He has undergone an echocardiogram, heart catheterization, and cardiac MRI, all of which showed no significant issues except for a swollen left chamber. Genetic testing is underway to determine the cause, suspected to be related to an immune system issue. He has started prednisone  and reports feeling better.  He previously underwent a colonoscopy years ago, but does not recall being told he had polyps. Since then, he has been using Cologuard for screening, with the most recent test in November 2024 being negative. No abdominal pain, changes in bowel movements, or blood in stools are reported.  He has an enlarged prostate, which is being monitored with an MRI scheduled for December. Previous imaging was negative. No family history of colon cancer is reported.  Previous GI Procedures/Imaging   EGD 06/03/2023 - Tortuous esophagus.  - No gross mucosal lesions in the proximal esophagus and in the mid esophagus.  - LA Grade A esophagitis with no bleeding found  distally. Biopsied.  - Z- line irregular, 43 cm from the incisors.  - 3 cm hiatal hernia.  - Antral gastritis. No other gross lesions in the entire stomach. Biopsied.  - A single duodenal polyp in D2. Resected and retrieved.  - Duodenitis. Biopsied. Path: 1. Surgical [P], gastric :      -  ANTRAL MUCOSA WITH CHEMICAL/REACTIVE/REPARATIVE CHANGE AND EVIDENCE OF      EROSION/ULCERATION, SEE NOTE      -  OXYNTIC MUCOSA WITH NO SIGNIFICANT PATHOLOGY.      -  NO HELICOBACTER PYLORI ORGANISMS IDENTIFIED ON H&E STAINED SLIDE.      NOTE: WHILE NOT PATHOGNOMONIC, SOME OF THE FEATURES (LAMINA PROPRIA      HYALINIZATION AND GLANDULAR ATROPHY) IN THE ANTRAL AREA OF EROSION ALSO SUGGEST      POSSIBLE ISCHEMIC TYPE CHANGE.  CLINICAL CORRELATION NECESSARY.       2. Surgical [P], duodenal :      -  DUODENAL MUCOSA WITH PROMINENT BRUNNER'S GLANDS AND FOCAL OXYNTIC MUCOSA WITH      OVERLYING FOVEOLAR EPITHELIUM CONSISTENT TO GASTRIC HETEROTOPIA.       3. Surgical [P], duodenal polyp, polyp (1) :      -  SUPERFICIAL FRAGMENTS OF SMALL INTESTINAL MUCOSA WITH EXTENSIVE FOVEOLAR      METAPLASIA SUGGESTING CHRONIC PEPTIC DUODENITIS.      -  SEPARATE FRAGMENT OF OXYNTIC MUCOSA, SEE NOTE.      NOTE: THE FRAGMENT OF OXYNTIC MUCOSA COULD REPRESENT EITHER ANOTHER GASTRIC      HETEROTOPIA OR POSSIBLE CARRYOVER (FAVORED MORPHOLOGICALLY).       4. Surgical [P], distal esophagus :      -  SQUAMOCOLUMNAR JUNCTIONAL MUCOSA WITH FEATURES CONSISTENT WITH REFLUX.      -  NEGATIVE FOR INTESTINAL METAPLASIA.   Colonoscopy 04/19/2009 - 4 polyps removed - Otherwise normal Path: 1. COLON, POLYP(S), TRANSVERSE (X3) : TUBULAR ADENOMA(THREE FRAGMENTS).NO HIGH   GRADE DYSPLASIA OR MALIGNANCY IDENTIFIED.   2. COLON, POLYP(S), CECUM : TUBULAR ADENOMA(ONE FRAGMENT).NO HIGH GRADE   DYSPLASIA OR MALIGNANCY IDENTIFIED.   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   Oligospermia    OSA on CPAP    PT LOST  WEIGHT - NO LONGER USING CPAP   Pain    RIGHT SHOULDER DELTOID MUSCLE ATROPHY - LOSS OF STRENGT VERY LIMITED RANGE OF MOTTION   Right knee skin infection    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   Sleep apnea    weight loss so no cpap now    Swelling of joint of right knee    Tachycardia 10/2023   Urticaria     Past Surgical  History:  Procedure Laterality Date   ADENOIDECTOMY     COLONOSCOPY     CORONARY PRESSURE/FFR STUDY N/A 10/27/2023   Procedure: CORONARY PRESSURE/FFR STUDY;  Surgeon: Mady Bruckner, MD;  Location: [facility];  Service: Cardiovascular;  Laterality: N/A;   EXCISIONAL TOTAL KNEE ARTHROPLASTY WITH ANTIBIOTIC SPACERS Right 04/27/2012   Procedure: EXCISIONAL TOTAL KNEE ARTHROPLASTY WITH ANTIBIOTIC SPACERS;  Surgeon: Donnice JONETTA Car, MD;  Location: [facility];  Service: Orthopedics;  Laterality: Right;  SAME DAY LABS   FRACTURE SURGERY  12/2020   IRRIGATION AND DEBRIDEMENT KNEE Right 04/02/2012   Procedure: IRRIGATION AND DEBRIDEMENT RIGHT PRE-PATELLA  ;  Surgeon: Donnice JONETTA Car, MD;  Location: [facility];  Service: Orthopedics;  Laterality: Right;   JOINT REPLACEMENT  [id number]   KNEE ARTHROSCOPY Right 08/2010   LEFT HEART CATH AND CORONARY ANGIOGRAPHY N/A 10/27/2023   Procedure: LEFT HEART CATH AND CORONARY ANGIOGRAPHY;  Surgeon: Mady Bruckner, MD;  Location: [facility];  Service: Cardiovascular;  Laterality: N/A;   POLYPECTOMY     SINOSCOPY     TONSILLECTOMY     TOTAL KNEE ARTHROPLASTY  01/28/2012   Procedure: TOTAL KNEE ARTHROPLASTY;  Surgeon: Donnice JONETTA Car, MD;  Location: [facility];  Service: Orthopedics;  Laterality: Right;   TOTAL KNEE ARTHROPLASTY Left 07/17/2015   Procedure: LEFT TOTAL KNEE ARTHROPLASTY;  Surgeon: Donnice Car, MD;  Location: [facility];  Service: Orthopedics;  Laterality: Left;   TOTAL KNEE REVISION Right 06/15/2012   Procedure: reimplantation of right total knee ;  Surgeon: Donnice JONETTA Car, MD;  Location: [facility];  Service: Orthopedics;  Laterality: Right;   TOTAL SHOULDER REPLACEMENT Bilateral    done in [facility]    ULNAR NERVE REPAIR Right 2008    Current Outpatient Medications  Medication Sig Dispense Refill   amiodarone (PACERONE) 200 MG tablet Take 2 tablets (400 mg total) by mouth 2 (two) times daily for 7 days, THEN 1 tablet (200 mg total) 2 (two) times daily for 7 days, THEN 1 tablet (200 mg total) daily. 72 tablet 0   aspirin  EC 81 MG tablet Take 1 tablet (81 mg total) by mouth daily. Swallow whole. 30 tablet 12   atorvastatin (LIPITOR) 40 MG tablet Take 1 tablet (40 mg total) by mouth daily. 90 tablet 3   B Complex-C-Folic Acid  (B COMPLEX-VITAMIN C-FOLIC ACID ) 1 MG tablet Take 1 tablet by mouth daily with breakfast. 100 tablet 3   celecoxib  (CELEBREX ) 200 MG capsule Take 1-2 capsules (200-400 mg total) by mouth daily as needed. (Patient taking differently: Take 400 mg by mouth at bedtime.) 180 capsule 0   Cholecalciferol (VITAMIN D3) 50 MCG (2000 UT) capsule Take 2 capsules (4,000 Units total) by mouth daily. (Patient taking differently: Take 2,000 Units by mouth daily.) 100 capsule 3   diltiazem (CARDIZEM CD) 120 MG 24 hr capsule Take 1 capsule (120 mg total) by mouth every 12 (twelve) hours. 90 capsule 3   famotidine  (PEPCID ) 20 MG tablet Take 20 mg by mouth at bedtime.     ferrous sulfate  325 (65 FE) MG tablet Take  325 mg by mouth daily with breakfast.     folic acid  (FOLVITE ) 1 MG tablet Take 1 mg by mouth daily.     Melatonin 10-10 MG TBCR Take 10 mg by mouth at bedtime as needed (sleep issues). Bedtime     Minoxidil (ROGAINE MENS) 5 % FOAM Apply 1 Application topically 4 (four) times a week.  Multiple Vitamin (MULTI-VITAMIN) tablet Take 1 tablet by mouth daily. [employer] Men's 50+     mycophenolate (CELLCEPT) 500 MG tablet Take 500 mg by mouth 2 (two) times daily.     nitroGLYCERIN (NITROSTAT) 0.4 MG SL tablet Place 1 tablet (0.4 mg total) under the tongue every 5 (five) minutes x 3 doses as needed for chest pain. 25 tablet 3   omeprazole  (PRILOSEC) 40 MG capsule Take 1 capsule (40 mg total) by mouth 2 (two) times daily before a meal for 30 days, THEN 1 capsule (40 mg total) daily. 90 capsule 6   predniSONE  (DELTASONE ) 10 MG tablet Take 3 tablets (30 mg total) by mouth daily with breakfast. 270 tablet 1   sulfamethoxazole -trimethoprim  (BACTRIM  DS) 800-160 MG tablet Take 1 tablet by mouth 3 (three) times a week. 12 tablet 6   tamsulosin  (FLOMAX ) 0.4 MG CAPS capsule Take 0.8 mg by mouth in the morning.     testosterone  cypionate (DEPOTESTOSTERONE CYPIONATE) 200 MG/ML injection INJECT 0.5ML (100MG  TOTAL) INTO THE MUSCLE EVERY 7 DAYS - SINGLE USE VIAL - CONTENTS MUST BE DISCARDED AFTER FIRST USE 10 mL 1   tirzepatide  (ZEPBOUND ) 2.5 MG/0.5ML injection vial Inject 2.5 mg into the skin once a week. 2 mL 5   No current facility-administered medications for this visit.    Allergies as of 11/05/2023 - Review Complete 11/05/2023  Allergen Reaction Noted   Amoxicillin  Hives 11/21/2015   Quinolones Other (See Comments) 10/07/2023   Vancomycin  Rash 04/27/2012   Lactose intolerance (gi) Other (See Comments) 05/27/2023   Oxycodone  Rash 04/01/2012   Penicillins Itching and Rash 12/02/2017   Tramadol  Itching 05/26/2017    Family History  Problem Relation Age of Onset   Alzheimer's disease Mother    Arthritis  Father    Diabetes Brother    Hyperlipidemia Other    Colon cancer Neg Hx    Colon polyps Neg Hx    Esophageal cancer Neg Hx    Stomach cancer Neg Hx    Rectal cancer Neg Hx     Social History   Tobacco Use   Smoking status: Former    Types: Cigars    Quit date: 1990    Years since quitting: 35.8   Smokeless tobacco: Never  Vaping Use   Vaping status: Never Used  Substance Use Topics   Alcohol use: Not Currently    Comment: occasionally   Drug use: No     Review of Systems:    Constitutional: No weight loss, fever, chills Cardiovascular: No chest pain Respiratory: No SOB or cough Gastrointestinal: See HPI and otherwise negative   Physical Exam:  Vital signs: BP 136/62   Pulse (!) 59   Ht 6' 1 (1.854 m)   Wt 231 lb 1 oz (104.8 kg)   BMI 30.49 kg/m   Wt Readings from Last 3 Encounters:  11/05/23 231 lb 1 oz (104.8 kg)  11/05/23 231 lb 12.8 oz (105.1 kg)  11/04/23 228 lb 9.6 oz (103.7 kg)    Constitutional: Pleasant, overweight male in NAD, alert and cooperative Head:  Normocephalic and atraumatic.  Eyes: No scleral icterus.  Respiratory: Respirations even and unlabored. Lungs clear to auscultation bilaterally.  No wheezes, crackles, or rhonchi.  Cardiovascular:  Regular rate and rhythm. No murmurs. No peripheral edema. Gastrointestinal:  Soft, nondistended, nontender. No rebound or guarding. Normal bowel sounds. No appreciable masses or hepatomegaly. Rectal:  Not performed.  Neurologic:  Alert and oriented x4;  grossly normal neurologically.  Skin:  Dry and intact without significant lesions or rashes. Psychiatric: Oriented to person, place and time. Demonstrates good judgement and reason without abnormal affect or behaviors.   RELEVANT LABS AND IMAGING: CBC    Component Value Date/Time   WBC 7.5 11/01/2023 0526   RBC 4.64 11/01/2023 0526   HGB 11.9 (L) 11/01/2023 0526   HCT 36.9 (L) 11/01/2023 0526   PLT 458 (H) 11/01/2023 0526   MCV 79.5 (L)  11/01/2023 0526   MCH 25.6 (L) 11/01/2023 0526   MCHC 32.2 11/01/2023 0526   RDW 16.5 (H) 11/01/2023 0526   LYMPHSABS 1.6 10/24/2023 2026   MONOABS 0.9 10/24/2023 2026   EOSABS 0.3 10/24/2023 2026   BASOSABS 0.1 10/24/2023 2026    CMP     Component Value Date/Time   NA 134 (L) 11/01/2023 0526   K 4.8 11/01/2023 0526   CL 99 11/01/2023 0526   CO2 25 11/01/2023 0526   GLUCOSE 123 (H) 11/01/2023 0526   BUN 20 11/01/2023 0526   CREATININE 1.75 (H) 11/01/2023 0526   CREATININE 1.08 09/02/2018 1026   CALCIUM  8.4 (L) 11/01/2023 0526   PROT 6.6 10/24/2023 2026   PROT 6.6 12/26/2022 0834   ALBUMIN 2.9 (L) 10/24/2023 2026   AST 21 10/24/2023 2026   ALT 22 10/24/2023 2026   ALKPHOS 81 10/24/2023 2026   BILITOT 0.5 10/24/2023 2026   GFRNONAA 40 (L) 11/01/2023 0526   GFRNONAA 69 09/02/2018 1026   GFRAA 80 09/02/2018 1026   Echocardiogram 10/27/2023 1. Left ventricular ejection fraction, by estimation, is 65 to 70% . The left ventricle has normal function. The left ventricle has no regional wall motion abnormalities. Left ventricular diastolic parameters are consistent with Grade I diastolic dysfunction ( impaired relaxation) .  2. Right ventricular systolic function is normal. The right ventricular size is normal.  3. Left atrial size was mild to moderately dilated.  4. The mitral valve is grossly normal. No evidence of mitral valve regurgitation. No evidence of mitral stenosis.  5. The aortic valve was not well visualized. Unable to determine aortic valve morphology due to image quality. Aortic valve regurgitation is not visualized. Aortic valve sclerosis is present, with no evidence of aortic valve stenosis.  6. Both the aortic root and ascending aorta are normal when indexed to BSA, age and sex.  7. The inferior vena cava is normal in size with greater than 50% respiratory variability, suggesting right atrial pressure of 3 mmHg.  Assessment/Plan:   History of adenomatous colon  polyp GERD with esophagitis Patient seen today for follow-up of EGD.  Underwent EGD 06/03/2023 and found to have LA grade a esophagitis, 3 cm hiatal hernia, tortuous esophagus, gastritis and duodenitis.  Repeat EGD in 4 months was recommended to follow-up on healing of the esophagus. He also has history of adenomatous colon polyps as seen on colonoscopy in 2011, however since then has been doing Cologuard test.  Discussed that Cologuard test not appropriate given his history of precancerous polyps.  He is agreeable to colonoscopy moving forward.  Recently admitted to the hospital after ventricular tachycardia was seen during stress testing.  Had been having dizziness and shortness of breath prior to this.  See HPI for more detail, echocardiogram showed LVEF of 65 to 70%, cardiac catheterization showed mild to moderate nonobstructive CAD.  He is continuing to undergo cardiology workup.  States that he feels fine and denies any chest pain or shortness of breath.  Not on blood thinners.  Will tentatively schedule EGD/colonoscopy  for December with cardiac clearance, and discuss this further with Dr. Wilhelmenia.   - Tentatively schedule EGD/colonoscopy, to discuss further with team. I thoroughly discussed the procedure with the patient to include nature of the procedure, alternatives, benefits, and risks (including but not limited to bleeding, infection, perforation, anesthesia/cardiac/pulmonary complications). Patient verbalized understanding and gave verbal consent to proceed with procedure.  - Request cardiac clearance for procedures   Camie Furbish, PA-C [facility] 11/05/2023, 1:56 PM  Patient Care Team: Garald Karlynn GAILS, MD as PCP - General (Internal Medicine) Croitoru, Jerel, MD as PCP - Cardiology (Cardiology) Ernie Cough, MD as Consulting Physician (Orthopedic Surgery) Napoleon Ozell Rush, MD as Referring Physician (Orthopedic Surgery) Comer, Lamar ORN, MD (Inactive) as  Consulting Physician (Infectious Diseases) Hope Madelon Easterly, MD as Referring Physician (Dermatology)

## 2023-11-05 NOTE — Telephone Encounter (Signed)
   Name: Chris Obrien  DOB: [birth year]  MRN: [id number]  Primary Cardiologist: Jerel Balding, MD  Chart reviewed as part of pre-operative protocol coverage. The patient has an upcoming visit scheduled with Dr. Balding  on 11/10/2023 at which time clearance can be addressed in case there are any issues that would impact surgical recommendations.  Endoscopy Procedure Is not scheduled until 12/17/2025as below. I added preop FYI to appointment note so that provider is aware to address at time of outpatient visit.  Per office protocol the cardiology provider should forward their finalized clearance decision and recommendations regarding antiplatelet therapy to the requesting party below.     I will route this message as FYI to requesting party and remove this message from the preop box as separate preop APP input not needed at this time.   Please call with any questions.  Lamarr Satterfield, NP  11/05/2023, 4:16 PM

## 2023-11-06 LAB — ALPHA GALACTOSIDASE: Alpha galactosidase, serum: 123.2 nmol/h/mg{prot} (ref >35.5)

## 2023-11-07 ENCOUNTER — Ambulatory Visit (HOSPITAL_COMMUNITY): Payer: Self-pay | Admitting: Family Medicine

## 2023-11-07 ENCOUNTER — Telehealth: Payer: Self-pay | Admitting: Gastroenterology

## 2023-11-07 ENCOUNTER — Telehealth: Payer: Self-pay

## 2023-11-07 LAB — SURGICAL PATHOLOGY

## 2023-11-07 LAB — IMMUNOFIXATION, URINE

## 2023-11-07 NOTE — Telephone Encounter (Signed)
 Please see Dr. Melba addendum to my note regarding location for procedures.  EGD/colonoscopy needs to be moved to hospital setting.  Please also confirm whether patient is taking an iron supplement.  If not can recommend ferrous sulfate  325 mg daily. Thank you

## 2023-11-07 NOTE — Progress Notes (Signed)
 Attending Physician's Attestation   I have reviewed the chart.   I agree with the Advanced Practitioner's note, impression, and recommendations with any updates as below. Based on patient's continued cardiology evaluation, his history of VT PVCs, I think it is best for us  to perform any further endoscopic evaluation in the hospital-based outpatient setting.  Going back a few months the patient also has evidence of iron deficiency anemia.  Certainly agree EGD/colonoscopy makes sense.  Would let patient know that we will work on scheduling his procedures in the hospital-based setting, for safety purposes and anesthesia standpoint.  This would be my recommendation at this time.  If he is not taking oral iron, he probably should based on his more recent labs from a few months ago.   Aloha Finner, MD [facility] Advanced Endoscopy Office # [phone]

## 2023-11-07 NOTE — Telephone Encounter (Signed)
 Attending Physician's Attestation    I have reviewed the chart.    I agree with the Advanced Practitioner's note, impression, and recommendations with any updates as below. Based on patient's continued cardiology evaluation, his history of VT PVCs, I think it is best for us  to perform any further endoscopic evaluation in the hospital-based outpatient setting.  Going back a few months the patient also has evidence of iron deficiency anemia.  Certainly agree EGD/colonoscopy makes sense.  Would let patient know that we will work on scheduling his procedures in the hospital-based setting, for safety purposes and anesthesia standpoint.  This would be my recommendation at this time.  If he is not taking oral iron, he probably should based on his more recent labs from a few months ago.

## 2023-11-10 ENCOUNTER — Telehealth: Payer: Self-pay

## 2023-11-10 ENCOUNTER — Encounter: Payer: Self-pay | Admitting: Infectious Disease

## 2023-11-10 ENCOUNTER — Other Ambulatory Visit: Payer: Self-pay

## 2023-11-10 ENCOUNTER — Encounter: Payer: Self-pay | Admitting: Cardiovascular Disease

## 2023-11-10 ENCOUNTER — Ambulatory Visit: Attending: Cardiovascular Disease | Admitting: Cardiovascular Disease

## 2023-11-10 VITALS — BP 110/70 | HR 70 | Ht 73.0 in | Wt 226.6 lb

## 2023-11-10 DIAGNOSIS — L308 Other specified dermatitis: Secondary | ICD-10-CM

## 2023-11-10 DIAGNOSIS — I429 Cardiomyopathy, unspecified: Secondary | ICD-10-CM | POA: Diagnosis not present

## 2023-11-10 DIAGNOSIS — T8459XA Infection and inflammatory reaction due to other internal joint prosthesis, initial encounter: Secondary | ICD-10-CM | POA: Insufficient documentation

## 2023-11-10 DIAGNOSIS — I472 Ventricular tachycardia, unspecified: Secondary | ICD-10-CM

## 2023-11-10 DIAGNOSIS — D509 Iron deficiency anemia, unspecified: Secondary | ICD-10-CM

## 2023-11-10 DIAGNOSIS — I2510 Atherosclerotic heart disease of native coronary artery without angina pectoris: Secondary | ICD-10-CM

## 2023-11-10 NOTE — Telephone Encounter (Signed)
 Left message on machine to call back

## 2023-11-10 NOTE — Patient Instructions (Signed)
 Medication Instructions:  No changes *If you need a refill on your cardiac medications before your next appointment, please call your pharmacy*  Lab Work: None ordered If you have labs (blood work) drawn today and your tests are completely normal, you will receive your results only by: MyChart Message (if you have MyChart) OR A paper copy in the mail If you have any lab test that is abnormal or we need to change your treatment, we will call you to review the results.  Testing/Procedures:                       Patient Instructions for Stress Test  Medication instructions- N/A  Do not eat, drink or use tobacco products four hours prior to the test.  Water  is ok.  3.  Dress prepared to exercise in a comfortable, two piece clothing outfit and walking shoes.  4.  Bring any current prescription medications with you the day of the test.  5.  Notify the office 24 hours in advance if you cannot keep this appointment.  6.  If you have any questions, please call [phone].   Follow-Up: At [facility], you and your health needs are our priority.  As part of our continuing mission to provide you with exceptional heart care, our providers are all part of one team.  This team includes your primary Cardiologist (physician) and Advanced Practice Providers or APPs (Physician Assistants and Nurse Practitioners) who all work together to provide you with the care you need, when you need it.  Your next appointment:   6 month(s)  Provider:   Jerel Balding, MD    We recommend signing up for the patient portal called MyChart.  Sign up information is provided on this After Visit Summary.  MyChart is used to connect with patients for Virtual Visits (Telemedicine).  Patients are able to view lab/test results, encounter notes, upcoming appointments, etc.  Non-urgent messages can be sent to your provider as well.   To learn more about what you can do with MyChart, go to [email or web address].

## 2023-11-10 NOTE — Telephone Encounter (Signed)
 Endo colon has been set up for 12/25/23 at 930 am at [facility] with GM.   Endo colon  scheduled, pt instructed and medications reviewed.  Patient instructions mailed to home.  Patient to call with any questions or concerns.

## 2023-11-10 NOTE — Telephone Encounter (Signed)
 Copied from CRM [id number]. Topic: Clinical - Prescription Issue >> Nov 10, 2023  3:10 PM Berneda FALCON wrote: Reason for CRM: Patient states the pharmacy called 3X last week and he also sent a MyChart message to PCP last Friday as well and still has not heard back.   This is in regards to his medication:  testosterone  cypionate (DEPOTESTOSTERONE CYPIONATE) 200 MG/ML injection  Pharmacy: [facility] [address] [place], [place] - [address] [place] AT [facility] [address] [place]. [place] [address] Phone: [phone] Fax: [phone] Hours: Not open 24 hours  Patient callback is [phone] (home)

## 2023-11-10 NOTE — Progress Notes (Signed)
 Cardiology Office Note   Date:  11/10/2023  ID:  Chris Obrien, DOB 06/05/1950, MRN [id number] PCP: Garald Karlynn GAILS, MD  [facility] Providers Cardiologist:  Jerel Balding, MD     History of Present Illness Chris Obrien is a 73 y.o. male who was recently hospitalized 10/24/2023 after he developed sustained ventricular tachycardia doing a treadmill stress test ordered for shortness of breath and dizziness with exertion, poorly defined cardiomyopathy, inflammatory rash of the anterior chest, problems with right shoulder prosthesis.    He feels markedly improved.  Before undergoing the treadmill stress test 2 weeks ago he was sure he was going to fail it because he felt short of breath and dizzy with minimal activity.  Now, he has no cardiovascular symptoms with mild or moderate physical activity.  In addition the rash on his anterior chest, which was protuberant, bulky and oozing, has dramatically improved to where his skin is almost smooth with, there are no oozing lesions and even the degree of discoloration has improved.  The only changes in his treatment have been addition of amiodarone for the arrhythmia and moderate dose steroids (currently on prednisone  30 mg daily) for the rash.  He continues to take mycophenolate and is also receiving diltiazem CD1 120 mg every 12 hours.  He underwent a very extensive cardiac workup that included an echocardiogram that showed normal right and left ventricular systolic function and regional wall motion, coronary angiography that showed no evidence of significant obstructive CAD, cardiac MRI which showed a nonspecific pattern of late gadolinium enhancement suggestive of an infiltrative process, negative PYP scan for amyloidosis.   Angiotensin-converting enzyme level was normal.  Alpha galactosidase A levels were normal.  Has submitted genetic testing for hypertrophic cardiomyopathy/Fabry's disease/ARVC, results pending.   He also underwent  repeat biopsy of his anterior chest skin rash showing dermal granulomatous dermatitis with extensive neutrophilic inflammation, nonspecific, but not typical for sarcoidosis.  Next steps planned are endomyocardial biopsy and/or cardiac PET to evaluate for cardiac sarcoidosis, once he is on lower doses of immunosuppressant medications.  He has a follow-up visit to discuss these options with Dr. Ezra Hazard in early December.  In the meantime he is planning to go to his home in [address] and take a 4-week [place] cruise.  He is worried about taking the cruise until we have some evidence of improved tendency to have ventricular arrhythmia.  Studies Reviewed    cMRI 10/2023 FINDINGS: 1. The left ventricle is normal in cavity size and moderate-severe left ventricular hypertrophy most prominent in the septum (2.0 cm). Global systolic function is normal with an LV ejection fraction calculated at 61%. There are no regional wall motion abnormalities.   2. The right ventricle is normal in cavity size and systolic function with a mild increase in RV wall thickness. There are no RV wall motion abnormalities or aneurysms.   3. Both atria are mildly dilated.   4. The aortic valve is trileaflet in morphology. There is no significant aortic valve stenosis or regurgitation.   5. There is flow acceleration at the LV septum without clear SAM. The gradient through the LVOT was not assessed. There is mild MR and TR. There is moderate PR.   6. Delayed enhancement imaging is abnormal. There is diffuse patchy subendocardial and mid-myocardial enhancement from base to apex with a more prominent focus of mid-myocardial enhancement in the basal lateral wall. The native T1 valve = 1143 and ECV = 40, which are increased.  7. There is no pericardial effusion. The thickness of the pericardium is normal.   8. The ascending thoracic aorta is mildly enlarged at 4.1 cm.   9. Normal pulmonary venous  drainage.   10. There is a pulmonary nodule present in the right middle lobe of the lung. Recommend dedicated CT imaging for further evaluation.   CONCLUSIONS: 1.  Moderate to severe LVH with preserved LV systolic function.   2. There is diffuse yet subtle late gadolinium enhancement of the subendocardium and mid-myocardium with prolonged T1 and ECV values with a more prominent focus of mid-myocardial LGE in the basal lateral wall. 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 Sarcoidosis is on the differential as well, but seems less likely.   4. Recommend screening for cardiac amyloid to confirm the diagnosis.   5. Recommend dedicated chest CT imaging to better assess the right lung nodule.     Echo 10/27/23:  1. Left ventricular ejection fraction, by estimation, is 65 to 70%. The  left ventricle has normal function. The left ventricle has no regional  wall motion abnormalities. Left ventricular diastolic parameters are  consistent with Grade I diastolic  dysfunction (impaired relaxation).   2. Right ventricular systolic function is normal. The right ventricular  size is normal.   3. Left atrial size was mild to moderately dilated.   4. The mitral valve is grossly normal. No evidence of mitral valve  regurgitation. No evidence of mitral stenosis.   5. The aortic valve was not well visualized. Unable to determine aortic  valve morphology due to image quality. Aortic valve regurgitation is not  visualized. Aortic valve sclerosis is present, with no evidence of aortic  valve stenosis.   6. Both the aortic root and ascending aorta are normal when indexed to  BSA, age and sex.   7. The inferior vena cava is normal in size with greater than 50%  respiratory variability, suggesting right atrial pressure of 3 mmHg.      LHC 10/2023: Conclusions: Mild-moderate, nonobstructive coronary artery disease, as detailed below, including 50% proximal LAD stenosis that is not  hemodynamically significant (RFR = 0.91), 50-60% ostial ramus intermedius lesion, and mild luminal irregularities in the LCx and RCA. Normal left ventricular filling pressure (LVEDP 8 mmHg).   Recommendations: Continue medical therapy and risk factor modification to prevent progression of disease. Proceed with cardiac MRI and electrophysiology consultation. Gentle post-catheterization hydration in the setting of moderate CKD.   ___ Risk Assessment/Calculations           Physical Exam VS:  BP 110/70 (BP Location: Left Arm, Patient Position: Sitting, Cuff Size: Large)   Pulse 70   Ht 6' 1 (1.854 m)   Wt 226 lb 9.6 oz (102.8 kg)   SpO2 96%   BMI 29.90 kg/m        Wt Readings from Last 3 Encounters:  11/10/23 226 lb 9.6 oz (102.8 kg)  11/05/23 231 lb 12.8 oz (105.1 kg)  11/05/23 231 lb 1 oz (104.8 kg)    GEN: Well nourished, well developed in no acute distress NECK: No JVD; No carotid bruits CARDIAC: RRR, no murmurs, rubs, gallops RESPIRATORY:  Clear to auscultation without rales, wheezing or rhonchi.  Anterior chest rash is now a dusky purple and appears less raised than before. ABDOMEN: Soft, non-tender, non-distended EXTREMITIES:  No edema; No deformity   ASSESSMENT AND PLAN  Ventricular tachycardia: When he was in the hospital he was having incessant PVCs and ventricular couplets as well  as nonsustained bursts of ventricular tachycardia.  On physical exam today I did not hear or palpate a single ectopic beat.  Before his planned cruise plan to perform another submaximal ECG stress test to establish a safe level of physical activity.  Continue taking amiodarone and diltiazem.  Do not hold these for the stress test.  We do not necessarily need to reach 85% of maximum heart rate, since this is not a screening test for CAD; would not go beyond stage III of the Bruce protocol. CMP: Mechanism of arrhythmia is unclear.  Subtle abnormalities are seen on the cardiac MRI that suggested  possible amyloidosis, but PYP scan was negative.  Sarcoidosis considered, but skin biopsy does not support this diagnosis, does not have lymphadenopathy in the mediastinum or lung changes.  Myeloma panel and urine immunofixation were also normal.  Consider endomyocardial biopsy or PET scan.  Dermatitis: Biopsy not typical for sarcoidosis.  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  On chronic mycophenolate, with prompt additional improvement on steroids.  We discussed the fact that steroid therapy is not a desirable long-term solution.   Unsure whether this could be related to his problems with the right shoulder prosthesis.  ESR seems to correlate with the rash activity. CAD: Nonobstructive, roughly 50% proximal LAD stenosis.  On appropriate lipid-lowering therapy with recent LDL cholesterol 73, but with chronically low HDL 27.  He does not have diabetes mellitus. Right shoulder prosthesis: No obvious signs of infection at this point.  Issues with loose hardware and plan for surgical removal. CKD3B (?): Based on labs from September-October creatinine baseline seems to be 1.7-1.8, corresponding to a GFR of roughly 40.  However, throughout the earlier months of 2025 his creatinine was 1.3-1.4 (GFR around 50) and in 2023-2024 his creatinine was frankly normal.  Not sure how this ties in with his other medical problems.  He is not on diuretics..    Informed Consent   Shared Decision Making/Informed Consent The risks [chest pain, shortness of breath, cardiac arrhythmias, dizziness, blood pressure fluctuations, myocardial infarction, stroke/transient ischemic attack, and life-threatening complications (estimated to be 1 in 10,000)], benefits (risk stratification, diagnosing coronary artery disease, treatment guidance) and alternatives of an exercise tolerance test were discussed in detail with Mr. Vandezande and he agrees to proceed.       Signed, Jerel Balding,  MD

## 2023-11-10 NOTE — Progress Notes (Unsigned)
 Subjective:  Chief Complaint: follow-up for PJI   Patient ID: Chris Obrien, male    DOB: 06/30/1950, 73 y.o.   MRN: [id number]  HPI  Past Medical History:  Diagnosis Date  . Acute reactive arthritis (HCC) per pcp note from 03-30-2012   right pre-patella---  secondary contact dermitis with poison ivy exposure 03-26-2012  . Allergy   . Angio-edema   . Arthritis   . BPH (benign prostatic hypertrophy)    hx small stream  . 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 Dermatitis 09/02/2022  . Fracture of right humerus 09/29/2023  . GERD (gastroesophageal reflux disease)    rare   . Hardware complicating wound infection 09/30/2023  . Insomnia, unspecified   . 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 Oligospermia   . OSA on CPAP    PT LOST  WEIGHT - NO LONGER USING CPAP  . Pain    RIGHT SHOULDER DELTOID MUSCLE ATROPHY - LOSS OF STRENGT VERY LIMITED RANGE OF MOTTION  . 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 Right knee skin infection   . 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 Sleep apnea    weight loss so no cpap now   . Swelling of joint of right knee   . Tachycardia 10/2023  . Urticaria     Past Surgical History:  Procedure Laterality Date  . ADENOIDECTOMY    . COLONOSCOPY    . CORONARY PRESSURE/FFR STUDY N/A 10/27/2023   Procedure: CORONARY PRESSURE/FFR STUDY;  Surgeon: Mady Bruckner, MD;  Location: [facility];  Service: Cardiovascular;  Laterality: N/A;  . EXCISIONAL TOTAL KNEE ARTHROPLASTY WITH ANTIBIOTIC SPACERS Right 04/27/2012   Procedure: EXCISIONAL TOTAL KNEE ARTHROPLASTY WITH ANTIBIOTIC SPACERS;  Surgeon: Donnice JONETTA Car, MD;  Location: [facility];  Service: Orthopedics;  Laterality: Right;  SAME DAY LABS  . FRACTURE SURGERY  12/2020  . IRRIGATION AND DEBRIDEMENT KNEE Right 04/02/2012   Procedure: IRRIGATION AND DEBRIDEMENT RIGHT PRE-PATELLA  ;  Surgeon: Donnice JONETTA Car, MD;  Location[facility];  Service: Orthopedics;   Laterality: Right;  . JOINT REPLACEMENT  [id number]  . KNEE ARTHROSCOPY Right 08/2010  . LEFT HEART CATH AND CORONARY ANGIOGRAPHY N/A 10/27/2023   Procedure: LEFT HEART CATH AND CORONARY ANGIOGRAPHY;  Surgeon: Mady Bruckner, MD;  Location: [facility];  Service: Cardiovascular;  Laterality: N/A;  . POLYPECTOMY    . SINOSCOPY    . TONSILLECTOMY    . TOTAL KNEE ARTHROPLASTY  01/28/2012   Procedure: TOTAL KNEE ARTHROPLASTY;  Surgeon: Donnice JONETTA Car, MD;  Location: [facility];  Service: Orthopedics;  Laterality: Right;  . TOTAL KNEE ARTHROPLASTY Left 07/17/2015   Procedure: LEFT TOTAL KNEE ARTHROPLASTY;  Surgeon: Donnice Car, MD;  Location: [facility];  Service: Orthopedics;  Laterality: Left;  . TOTAL KNEE REVISION Right 06/15/2012   Procedure: reimplantation of right total knee ;  Surgeon: Donnice JONETTA Car, MD;  Location: [facility];  Service: Orthopedics;  Laterality: Right;  . TOTAL SHOULDER REPLACEMENT Bilateral    done in [place]   . ULNAR NERVE REPAIR Right 2008    Family History  Problem Relation Age of Onset  . Alzheimer's disease Mother   . Arthritis Father   . Diabetes Brother   . Hyperlipidemia Other   . Colon cancer Neg Hx   . Colon polyps Neg Hx   . Esophageal cancer Neg Hx   . Stomach cancer Neg Hx   . Rectal cancer Neg Hx  Social History   Socioeconomic History  . Marital status: Married    Spouse name: Not on file  . Number of children: Not on file  . Years of education: 75  . Highest education level: Bachelor's degree (e.g., BA, AB, BS)  Occupational History  . Occupation: production designer, theatre/television/film    Comment: [employer]  . Occupation: MANAGER    Employer: [employer]  Tobacco Use  . Smoking status: Former    Types: Cigars    Quit date: 1990    Years since quitting: 35.8  . Smokeless tobacco: Never  Vaping Use  . Vaping status: Never Used  Substance and Sexual Activity  . Alcohol use: Not Currently    Comment: occasionally  . Drug use: No  . Sexual  activity: Yes    Birth control/protection: None  Other Topics Concern  . Not on file  Social History Narrative   ** Merged History Encounter **       Caffeine 4 cups avg daily, 4 cans soda.   Social Drivers of Corporate Investment Banker Strain: Low Risk  (08/22/2023)   Overall Financial Resource Strain (CARDIA)   . Difficulty of Paying Living Expenses: Not hard at all  Food Insecurity: No Food Insecurity (10/25/2023)   Hunger Vital Sign   . Worried About Programme Researcher, Broadcasting/film/video in the Last Year: Never true   . Ran Out of Food in the Last Year: Never true  Transportation Needs: No Transportation Needs (10/25/2023)   PRAPARE - Transportation   . Lack of Transportation (Medical): No   . Lack of Transportation (Non-Medical): No  Physical Activity: Sufficiently Active (08/22/2023)   Exercise Vital Sign   . Days of Exercise per Week: 4 days   . Minutes of Exercise per Session: 40 min  Stress: No Stress Concern Present (08/22/2023)   [employer] of Occupational Health - Occupational Stress Questionnaire   . Feeling of Stress: Not at all  Social Connections: Moderately Integrated (10/25/2023)   Social Connection and Isolation Panel   . Frequency of Communication with Friends and Family: More than three times a week   . Frequency of Social Gatherings with Friends and Family: Three times a week   . Attends Religious Services: Never   . Active Member of Clubs or Organizations: Yes   . Attends Banker Meetings: Never   . Marital Status: Married    Allergies  Allergen Reactions  . Amoxicillin  Hives  . Quinolones Other (See Comments)    Ascending thoracic aortic aneurysm   . Vancomycin  Rash    Severe redness all over body with swollen ankles and hands  . Lactose Intolerance (Gi) Other (See Comments)    Red on skin  . Oxycodone  Rash  . Penicillins Itching and Rash  . Tramadol  Itching    2019 Shoulder post-op     Current Outpatient Medications:  .  amiodarone  (PACERONE) 200 MG tablet, Take 2 tablets (400 mg total) by mouth 2 (two) times daily for 7 days, THEN 1 tablet (200 mg total) 2 (two) times daily for 7 days, THEN 1 tablet (200 mg total) daily., Disp: 72 tablet, Rfl: 0 .  aspirin  EC 81 MG tablet, Take 1 tablet (81 mg total) by mouth daily. Swallow whole., Disp: 30 tablet, Rfl: 12 .  atorvastatin (LIPITOR) 40 MG tablet, Take 1 tablet (40 mg total) by mouth daily., Disp: 90 tablet, Rfl: 3 .  B Complex-C-Folic Acid  (B COMPLEX-VITAMIN C-FOLIC ACID ) 1 MG tablet, Take  1 tablet by mouth daily with breakfast., Disp: 100 tablet, Rfl: 3 .  celecoxib  (CELEBREX ) 200 MG capsule, Take 1-2 capsules (200-400 mg total) by mouth daily as needed. (Patient taking differently: Take 400 mg by mouth at bedtime.), Disp: 180 capsule, Rfl: 0 .  Cholecalciferol (VITAMIN D3) 50 MCG (2000 UT) capsule, Take 2 capsules (4,000 Units total) by mouth daily. (Patient taking differently: Take 2,000 Units by mouth daily.), Disp: 100 capsule, Rfl: 3 .  diltiazem (CARDIZEM CD) 120 MG 24 hr capsule, Take 1 capsule (120 mg total) by mouth every 12 (twelve) hours., Disp: 90 capsule, Rfl: 3 .  famotidine  (PEPCID ) 20 MG tablet, Take 20 mg by mouth at bedtime., Disp: , Rfl:  .  ferrous sulfate  325 (65 FE) MG tablet, Take 325 mg by mouth daily with breakfast., Disp: , Rfl:  .  folic acid  (FOLVITE ) 1 MG tablet, Take 1 mg by mouth daily., Disp: , Rfl:  .  Melatonin 10-10 MG TBCR, Take 10 mg by mouth at bedtime as needed (sleep issues). Bedtime, Disp: , Rfl:  .  Minoxidil (ROGAINE MENS) 5 % FOAM, Apply 1 Application topically 4 (four) times a week., Disp: , Rfl:  .  Multiple Vitamin (MULTI-VITAMIN) tablet, Take 1 tablet by mouth daily. Thorne Men's 50+, Disp: , Rfl:  .  mycophenolate (CELLCEPT) 500 MG tablet, Take 500 mg by mouth 2 (two) times daily., Disp: , Rfl:  .  nitroGLYCERIN (NITROSTAT) 0.4 MG SL tablet, Place 1 tablet (0.4 mg total) under the tongue every 5 (five) minutes x 3 doses as needed  for chest pain., Disp: 25 tablet, Rfl: 3 .  omeprazole  (PRILOSEC) 40 MG capsule, Take 1 capsule (40 mg total) by mouth 2 (two) times daily before a meal for 30 days, THEN 1 capsule (40 mg total) daily., Disp: 90 capsule, Rfl: 6 .  predniSONE  (DELTASONE ) 10 MG tablet, Take 3 tablets (30 mg total) by mouth daily with breakfast., Disp: 270 tablet, Rfl: 1 .  sulfamethoxazole -trimethoprim  (BACTRIM  DS) 800-160 MG tablet, Take 1 tablet by mouth 3 (three) times a week., Disp: 12 tablet, Rfl: 6 .  tamsulosin  (FLOMAX ) 0.4 MG CAPS capsule, Take 0.8 mg by mouth in the morning., Disp: , Rfl:  .  testosterone  cypionate (DEPOTESTOSTERONE CYPIONATE) 200 MG/ML injection, INJECT 0.5ML (100MG  TOTAL) INTO THE MUSCLE EVERY 7 DAYS - SINGLE USE VIAL - CONTENTS MUST BE DISCARDED AFTER FIRST USE, Disp: 10 mL, Rfl: 1 .  tirzepatide  (ZEPBOUND ) 2.5 MG/0.5ML injection vial, Inject 2.5 mg into the skin once a week., Disp: 2 mL, Rfl: 5   Review of Systems     Objective:   Physical Exam        Assessment & Plan:

## 2023-11-11 ENCOUNTER — Other Ambulatory Visit: Payer: Self-pay | Admitting: Internal Medicine

## 2023-11-11 ENCOUNTER — Telehealth: Payer: Self-pay

## 2023-11-11 ENCOUNTER — Ambulatory Visit (INDEPENDENT_AMBULATORY_CARE_PROVIDER_SITE_OTHER): Payer: Self-pay | Admitting: Infectious Disease

## 2023-11-11 ENCOUNTER — Encounter: Payer: Self-pay | Admitting: Infectious Disease

## 2023-11-11 ENCOUNTER — Telehealth (HOSPITAL_COMMUNITY): Payer: Self-pay | Admitting: *Deleted

## 2023-11-11 ENCOUNTER — Other Ambulatory Visit: Payer: Self-pay

## 2023-11-11 VITALS — BP 119/75 | HR 72 | Temp 98.4°F | Resp 16 | Wt 228.0 lb

## 2023-11-11 DIAGNOSIS — L929 Granulomatous disorder of the skin and subcutaneous tissue, unspecified: Secondary | ICD-10-CM | POA: Diagnosis present

## 2023-11-11 DIAGNOSIS — B9689 Other specified bacterial agents as the cause of diseases classified elsewhere: Secondary | ICD-10-CM | POA: Diagnosis not present

## 2023-11-11 DIAGNOSIS — B59 Pneumocystosis: Secondary | ICD-10-CM | POA: Diagnosis not present

## 2023-11-11 DIAGNOSIS — T849XXD Unspecified complication of internal orthopedic prosthetic device, implant and graft, subsequent encounter: Secondary | ICD-10-CM

## 2023-11-11 DIAGNOSIS — Z96611 Presence of right artificial shoulder joint: Secondary | ICD-10-CM

## 2023-11-11 DIAGNOSIS — T368X5D Adverse effect of other systemic antibiotics, subsequent encounter: Secondary | ICD-10-CM

## 2023-11-11 DIAGNOSIS — D718 Other functional disorders of polymorphonuclear neutrophils: Secondary | ICD-10-CM

## 2023-11-11 DIAGNOSIS — T847XXD Infection and inflammatory reaction due to other internal orthopedic prosthetic devices, implants and grafts, subsequent encounter: Secondary | ICD-10-CM

## 2023-11-11 DIAGNOSIS — T8459XD Infection and inflammatory reaction due to other internal joint prosthesis, subsequent encounter: Secondary | ICD-10-CM

## 2023-11-11 DIAGNOSIS — Z889 Allergy status to unspecified drugs, medicaments and biological substances status: Secondary | ICD-10-CM

## 2023-11-11 DIAGNOSIS — A498 Other bacterial infections of unspecified site: Secondary | ICD-10-CM

## 2023-11-11 NOTE — Telephone Encounter (Signed)
 Reminder call given with instructions for upcoming GXT on 11/12/23.

## 2023-11-11 NOTE — Telephone Encounter (Signed)
 Copied from CRM [id number]. Topic: Clinical - Prescription Issue >> Nov 11, 2023  4:54 PM Shanda MATSU wrote: Reason for CRM: Patient called back to adv that pharmacy told him link we used to submit med refill for med, testosterone  cypionate (DEPOTESTOSTERONE CYPIONATE) 200 MG/ML injection, has expired so a new one has to be sent, patient is req that this be sent ASAP as he is leaving out of town and needs this med before he leaves.

## 2023-11-11 NOTE — Patient Instructions (Signed)
 If you have surgery on your shoulder I would want you to stop  the Bactrim  three times a week antibiotic for at least 2 weeks if not 3-4 BEFORE the surgery to improve yield on any intra-operative cultures   If the shoulder turns out to be infected I would want to get you on antibiotics, traditionally IV ones but could use pills to target organism that grows + likely the cutibacterium we saw before

## 2023-11-11 NOTE — Telephone Encounter (Signed)
This was addressed. Thanks.

## 2023-11-12 ENCOUNTER — Telehealth: Payer: Self-pay

## 2023-11-12 ENCOUNTER — Encounter: Payer: Self-pay | Admitting: Cardiovascular Disease

## 2023-11-12 ENCOUNTER — Ambulatory Visit: Attending: Cardiovascular Disease

## 2023-11-12 ENCOUNTER — Ambulatory Visit: Payer: Self-pay | Admitting: Cardiovascular Disease

## 2023-11-12 ENCOUNTER — Telehealth: Payer: Self-pay | Admitting: Internal Medicine

## 2023-11-12 DIAGNOSIS — I472 Ventricular tachycardia, unspecified: Secondary | ICD-10-CM | POA: Diagnosis present

## 2023-11-12 LAB — EXERCISE TOLERANCE TEST
Angina Index: 0
Estimated workload: 7
Exercise duration (min): 6 min
Exercise duration (sec): 0 s
MPHR: 145 {beats}/min
Peak HR: 108 {beats}/min
Percent HR: 74 %
RPE: 18
Rest HR: 70 {beats}/min

## 2023-11-12 NOTE — Telephone Encounter (Signed)
 I already called Chris Obrien and told him he is good to go for his cruise and we discussed the infectious disease concerns.  I recommended delaying the COVID shot while he is on such high doses of anti-inflammatory/immunosuppressive medications.  Vaccines may not work with the immune suppression

## 2023-11-12 NOTE — Telephone Encounter (Signed)
  This RN called pharmacy, they state the order from yesterday was not received. Please re-send, thank you.   Copied from CRM [id number]. Topic: Clinical - Prescription Issue >> Nov 10, 2023  3:10 PM Berneda FALCON wrote: Reason for CRM: Patient states the pharmacy called 3X last week and he also sent a MyChart message to PCP last Friday as well and still has not heard back.   This is in regards to his medication:  testosterone  cypionate (DEPOTESTOSTERONE CYPIONATE) 200 MG/ML injection  Pharmacy: [facility] [address] [place] - [address] AT [address] [place]. [place] [address] Phone: [phone] Fax: [phone] Hours: Not open 24 hours  Patient callback is [phone] (home) >> Nov 12, 2023  1:23 PM Mia F wrote: Pt is calling back stating that the pharmacy is still telling him that this rx has been denied. Pt was asked if the insurance is denying the rx? He states he is not sure but he know the pharmacy has been telling him that the office has been sending an expired script and that's why it has not been getting filled. Pt was advised to check with pharmacy to see if the pharmacy meant that the insurance company is denying the rx since this is a rx that he pays out of pocket for. Pt says he will check with the pharmacy but he does not see them making this mistake when they know he always pay for this out of pocket. Pt asks if the office can also look into what's going on as well as he is leaving to go to [facility] tomorrow and need his medication

## 2023-11-12 NOTE — Telephone Encounter (Signed)
 Copied from CRM [id number]. Topic: Clinical - Prescription Issue >> Nov 12, 2023  1:41 PM Mesmerise C wrote: Reason for CRM: Patient spoke to [facility] and stated the prescription they received yesterday was an expired request and they need a new prescription they keep getting denied refill requests, called [facility] and spoke to [facility] who stated the prescription was sent in yesterday advised it was expired and a new one had to be sent, patient leaves for [place]  tomorrow and needs a new prescription to be sent ASAP and would like to be called once everything is taken care of

## 2023-11-12 NOTE — Telephone Encounter (Signed)
 Copied from CRM [id number]. Topic: Clinical - Medication Question >> Nov 12, 2023  4:44 PM Viola F wrote: Reason for CRM: [facility] called to request that the testosterone  cypionate (DEPOTESTOSTERONE CYPIONATE) 200 MG/ML injection be refaxed/resent as soon as possible because they never received it.

## 2023-11-13 MED ORDER — TESTOSTERONE CYPIONATE 200 MG/ML IM SOLN
200.0000 mg | INTRAMUSCULAR | 1 refills | Status: DC
Start: 2023-11-13 — End: 2023-11-16

## 2023-11-13 NOTE — Telephone Encounter (Signed)
 Noted.  I will send a new prescription again.  Thanks

## 2023-11-13 NOTE — Telephone Encounter (Signed)
 Patient unfortunately in Chris Obrien  when reaching out to him. I have given the approval for prescription to be changed to the location in [place] . Called [facility] in [facility]  and approved the transfer

## 2023-11-13 NOTE — Addendum Note (Signed)
 Addended by: Osby Sweetin V on: 11/13/2023 07:28 AM   Modules accepted: Orders

## 2023-11-14 ENCOUNTER — Other Ambulatory Visit (HOSPITAL_COMMUNITY): Payer: Self-pay

## 2023-11-15 NOTE — Progress Notes (Signed)
 Coding Query:  Type 2 Myocardial Infarction:   Acute rise and/or fall in troponin with acute ischemia resulting in an acute myocardial infarction due to demand ischemia (ie. severe HTN, sustained tachyarrhythmia) Demand Ischemia without a Myocardial Infarction:  A mismatch between myocardial oxygen need and demand.  May or may not have rise in troponin levels.  Not due to CAD.  Fonda Kitty, MD, [facility], [facility] Cardiac Electrophysiology

## 2023-11-16 ENCOUNTER — Other Ambulatory Visit: Payer: Self-pay | Admitting: Internal Medicine

## 2023-11-16 MED ORDER — TESTOSTERONE CYPIONATE 200 MG/ML IM SOLN: 200.0000 mg | INTRAMUSCULAR | 1 refills | Status: DC

## 2023-11-16 NOTE — Progress Notes (Signed)
 It was sent to local [facility] previously.  Now sent again to [facility] in [place] .  Thanks

## 2023-11-16 NOTE — Telephone Encounter (Signed)
 It was sent to local [facility] previously.  Now sent again to [facility] in [place] .  Thanks

## 2023-11-17 ENCOUNTER — Inpatient Hospital Stay (HOSPITAL_COMMUNITY)

## 2023-12-01 ENCOUNTER — Ambulatory Visit: Admitting: Physician Assistant

## 2023-12-06 ENCOUNTER — Encounter: Payer: Self-pay | Admitting: Gastroenterology

## 2023-12-07 ENCOUNTER — Encounter: Payer: Self-pay | Admitting: Internal Medicine

## 2023-12-07 NOTE — Assessment & Plan Note (Addendum)
 Dermatitis located on chest, on cellcept  at home, lymph node biopsy not suggestive of lymphoma. 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  He has been on methotrexate + hydroxychloroquine initially, now on mycophenolate  mofetil. ESR elevated at 48, CRP elevated at 11. ANA, RF, anti-CCP ab all checked at [facility] in the past and negative. Based on past workup, this does not seem to be a sarcoidosis-type skin lesion. Patient had recent CTA chest without hilar or mediastinal lymphadenopathy and no lung parenchymal changes consistent with sarcoidosis.  Question if allergic reaction to shoulder prosthesis hardware.   Doing much better on 30 mg of prednisone 

## 2023-12-07 NOTE — Assessment & Plan Note (Signed)
 Due to arrhythmia sustained ventricular tachycardia-resolved.  No need for ICD.  Continue with Cardizem  and Toprol  with amiodarone .  In general feeling much better on prednisone 

## 2023-12-07 NOTE — Assessment & Plan Note (Addendum)
 Due to arrhythmia sustained ventricular tachycardia-resolved.  No need for ICD.  Continue with Cardizem  and Toprol  with amiodarone .  In general feeling much better on prednisone  30 mg a day

## 2023-12-07 NOTE — Assessment & Plan Note (Signed)
 LHC with 50% proximal LAD stenosis not significant by RFR, no intervention. Pursue risk factor modification On  ASA and lipitor, PRN NTG

## 2023-12-07 NOTE — Assessment & Plan Note (Signed)
 We can restart testosterone  injections

## 2023-12-08 ENCOUNTER — Ambulatory Visit
Admission: RE | Admit: 2023-12-08 | Discharge: 2023-12-08 | Disposition: A | Source: Ambulatory Visit | Attending: Nurse Practitioner

## 2023-12-08 DIAGNOSIS — R972 Elevated prostate specific antigen [PSA]: Secondary | ICD-10-CM

## 2023-12-08 MED ORDER — GADOPICLENOL 0.5 MMOL/ML IV SOLN
10.0000 mL | Freq: Once | INTRAVENOUS | Status: AC | PRN
  Administered 2023-12-08: 10 mL via INTRAVENOUS

## 2023-12-12 ENCOUNTER — Other Ambulatory Visit (HOSPITAL_COMMUNITY): Payer: Self-pay

## 2023-12-12 ENCOUNTER — Ambulatory Visit (HOSPITAL_COMMUNITY): Admission: RE | Admit: 2023-12-12 | Discharge: 2023-12-12 | Attending: Cardiology | Admitting: Cardiology

## 2023-12-12 ENCOUNTER — Encounter (HOSPITAL_COMMUNITY): Payer: Self-pay | Admitting: Cardiology

## 2023-12-12 VITALS — BP 152/80 | HR 72 | Ht 73.0 in | Wt 234.8 lb

## 2023-12-12 DIAGNOSIS — I472 Ventricular tachycardia, unspecified: Secondary | ICD-10-CM

## 2023-12-12 DIAGNOSIS — I429 Cardiomyopathy, unspecified: Secondary | ICD-10-CM

## 2023-12-12 DIAGNOSIS — I5032 Chronic diastolic (congestive) heart failure: Secondary | ICD-10-CM

## 2023-12-12 DIAGNOSIS — R21 Rash and other nonspecific skin eruption: Secondary | ICD-10-CM | POA: Diagnosis present

## 2023-12-12 LAB — COMPREHENSIVE METABOLIC PANEL WITH GFR
ALT: 34 U/L (ref 0–44)
AST: 29 U/L (ref 15–41)
Albumin: 3.4 g/dL — ABNORMAL LOW (ref 3.5–5.0)
Alkaline Phosphatase: 54 U/L (ref 38–126)
Anion gap: 7 (ref 5–15)
BUN: 23 mg/dL (ref 8–23)
CO2: 30 mmol/L (ref 22–32)
Calcium: 8.4 mg/dL — ABNORMAL LOW (ref 8.9–10.3)
Chloride: 101 mmol/L (ref 98–111)
Creatinine, Ser: 2.01 mg/dL — ABNORMAL HIGH (ref 0.61–1.24)
GFR, Estimated: 34 mL/min — ABNORMAL LOW (ref >60)
Glucose, Bld: 96 mg/dL (ref 70–99)
Potassium: 4.8 mmol/L (ref 3.5–5.1)
Sodium: 138 mmol/L (ref 135–145)
Total Bilirubin: 1 mg/dL (ref 0.0–1.2)
Total Protein: 5.8 g/dL — ABNORMAL LOW (ref 6.5–8.1)

## 2023-12-12 LAB — BRAIN NATRIURETIC PEPTIDE: B Natriuretic Peptide: 229.4 pg/mL — ABNORMAL HIGH (ref 0.0–100.0)

## 2023-12-12 LAB — TSH: TSH: 1.617 u[IU]/mL (ref 0.350–4.500)

## 2023-12-12 MED ORDER — PREDNISONE 10 MG PO TABS: ORAL_TABLET | ORAL | 0 refills | Status: AC

## 2023-12-12 NOTE — Patient Instructions (Addendum)
 CHANGE Prednisone  to 20 mg daily for 2 weeks,  then 10 mg daily for 2 weeks,  then 5 mg (1/2 Tab) daily for 2 week. Once you get to this dose of Prednisone  you can stop your Bactrim   Labs done today, your results will be available in MyChart, we will contact you for abnormal readings.  Your physician recommends that you schedule a follow-up appointment in: 2 months ( February 2026) ** PLEASE CALL THE OFFICE IN [place] TO ARRANGE YOUR FOLLOW UP APPOINTMENT.**  If you have any questions or concerns before your next appointment please send us  a message through Register or call our office at [phone].    TO LEAVE A MESSAGE FOR THE NURSE SELECT OPTION 2, PLEASE LEAVE A MESSAGE INCLUDING: YOUR NAME DATE OF BIRTH CALL BACK NUMBER REASON FOR CALL**this is important as we prioritize the call backs  YOU WILL RECEIVE A CALL BACK THE SAME DAY AS LONG AS YOU CALL BEFORE 4:00 PM  At the [facility], you and your health needs are our priority. As part of our continuing mission to provide you with exceptional heart care, we have created designated Provider Care Teams. These Care Teams include your primary Cardiologist (physician) and Advanced Practice Providers (APPs- Physician Assistants and Nurse Practitioners) who all work together to provide you with the care you need, when you need it.   You may see any of the following providers on your designated Care Team at your next follow up: Dr Toribio Fuel Dr Ezra Shuck Dr. Morene Brownie Greig Mosses, NP Caffie Shed, [place] [facility], [place] Beckey Coe, NP Jordan Lee, NP Ellouise Class, NP Tinnie Redman, PharmD Jaun Bash, PharmD   Please be sure to bring in all your medications bottles to every appointment.    Thank you for choosing [facility]

## 2023-12-13 NOTE — Addendum Note (Signed)
 Encounter addended by: Rolan Ezra RAMAN, MD on: 12/13/2023 10:01 PM  Actions taken: Clinical Note Signed

## 2023-12-13 NOTE — Progress Notes (Addendum)
 ADVANCED HF CLINIC CONSULT NOTE  Primary Care: Plotnikov, Karlynn GAILS, MD Primary Cardiologist: Jerel Balding, MD HF Cardiologist: Dr. Rolan  Chief complaint: Cardiomyopathy  HPI: 73 y.o. male with history of chest wall rash, right prosthetic shoulder joint infection, CKD stabe 3, ascending aortic aneurysm.   In 2025, patient developed a bullous chest wall rash.  Skin biopsy and axillary lymph node biopsy at [facility] were consistent with intravascular histiocytosis, no evidence for lymphoma on the lymph node biopsy.  This can be primary or secondary to rheumatoid arthritis or metallic prosthesis.  Patient has a prosthetic shoulder joint.  This is a rare condition, but can be treated with immunosuppression. He has been on methotrexate + hydroxychloroquine initially, now on mycophenolate  mofetil.    Due to lightheadedness and exertional dyspnea, he underwent a stress echo in 10/25. One minute into stage I of Bruce protocol, he developed symptomatic sustained VT which terminated spontaneously.  BP stable. He was short of breath.  He was sent to the ED for urgent evaluation. He was admitted under Cardiology service for further workup. Cardiac cath with 50% proximal LAD (not significant by RFR, 0.91), 50-60% ostial ramus.  EP consulted. He was started on diltiazem  CD and amiodarone . High suspicion for acute on chronic inflammatory process.  Cardiac MRI showed LVEF 61%, moderate to severe LVH (septum 2.0 cm thick) without systolic anterior motion, RV function okay, diffuse patchy LGE subendocardial and mid-myocardial LGE from base to apex, ECV 40%. Study suggestive of cardiac amyloidosis vs sarcoidosis. He underwent PYP scan which showed no evidence of cardiac amyloidosis, myeloma workup negative.  Repeat skin biopsy of the chest was done, showing chronic granulomatous dermatitis, not typical for sarcoidosis.  Given suspicion for inflammatory cardiomyopathy, prednisone  was begun.   He returns today for  followup of cardiomyopathy and VT.  The rash on his chest is still present but significantly improved on prednisone .  He is doing well symptomatically.  No exertional dyspnea.  He is doing PT for his shoulder and goes to the gym regularly. Getting in 10,000 steps/day.  No problems walking up stairs.  No chest pain.  No palpitations or lightheadedness.  BP mildly elevated today, weight down 3 lbs.   ECG (personally reviewed): NSR, nonspecific T wave flattening  Labs (10/25): K 4.8, creatinine 1.75, SPEP and urine immunofixation without M spike, LDL 73, ACE level normal, alpha galactosidase level 123 (normal).   PMH: 1. CKD stage 3 2. 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  3. VT: 10/25, noted on stress echo.  4. Bullous chest wall rash:  Skin biopsy and axillary lymph node biopsy in 4/25 at [facility] were consistent with intravascular histiocytosis.  This can be primary or secondary to rheumatoid arthritis or metallic prosthesis.  Patient has a prosthetic shoulder joint.  This is a rare condition, but can be treated with immunosuppression.  - He has been on methotrexate + hydroxychloroquine initially, now on mycophenolate  mofetil.  - Repeat chest wall biopsy at [facility] showed chronic granulomatous dermatitis, not typical for sarcoidosis.  5. Cardiomyopathy: Cardiac MRI (10/25) showed LVEF 61%, moderate to severe LVH (septum 2.0 cm) w/o SAM, RV function okay, diffuse patchy LGE subendocardial and mid-myocardial LGE from base to apex, ECV 40%. Study suggestive of cardiac amyloidosis vs sarcoidosis.  - Zio (9/25): mostly NSR, 5 runs of NSVT, no AF, 4.2% PVC burden, symptom trigger episodes correspond to NSR with PVCs - PYP scan (10/25): no evidence for cardiac amyloidosis; Myeloma  panel and urine immunofixation with no monoclonal light chain.  - LHC (10/25): mild to moderate non-obstructive CAD; 50% pLAD, 50-60% ostial ramus - Echo (10/25): EF  65-70%, G1DD, normal RV, IVC not dilated - Alpha galactosidase level normal, ACE level normal.  6. CAD: LHC in 10/25 showed mild to moderate non-obstructive CAD; 50% pLAD, 50-60% ostial ramus.    Current Outpatient Medications  Medication Sig Dispense Refill   amiodarone  (PACERONE ) 200 MG tablet Take 2 tablets (400 mg total) by mouth 2 (two) times daily for 7 days, THEN 1 tablet (200 mg total) 2 (two) times daily for 7 days, THEN 1 tablet (200 mg total) daily. 72 tablet 0   aspirin  EC 81 MG tablet Take 1 tablet (81 mg total) by mouth daily. Swallow whole. 30 tablet 12   atorvastatin  (LIPITOR) 40 MG tablet Take 1 tablet (40 mg total) by mouth daily. 90 tablet 3   B Complex-C-Folic Acid  (B COMPLEX-VITAMIN C-FOLIC ACID ) 1 MG tablet Take 1 tablet by mouth daily with breakfast. 100 tablet 3   Cholecalciferol  (VITAMIN D3) 50 MCG (2000 UT) capsule Take 2 capsules (4,000 Units total) by mouth daily. (Patient taking differently: Take 2,000 Units by mouth daily.) 100 capsule 3   diltiazem  (CARDIZEM  CD) 120 MG 24 hr capsule Take 1 capsule (120 mg total) by mouth every 12 (twelve) hours. 90 capsule 3   famotidine  (PEPCID ) 20 MG tablet Take 20 mg by mouth at bedtime.     ferrous sulfate  325 (65 FE) MG tablet Take 325 mg by mouth daily with breakfast.     folic acid  (FOLVITE ) 1 MG tablet Take 1 mg by mouth daily.     Melatonin 10-10 MG TBCR Take 10 mg by mouth at bedtime as needed (sleep issues). Bedtime     Minoxidil (ROGAINE MENS) 5 % FOAM Apply 1 Application topically 4 (four) times a week.     Multiple Vitamin (MULTI-VITAMIN) tablet Take 1 tablet by mouth daily. [facility] Men's 50+     mycophenolate  (CELLCEPT ) 500 MG tablet Take 500 mg by mouth 2 (two) times daily.     Na Sulfate-K Sulfate-Mg Sulfate concentrate (SUPREP) 17.5-3.13-1.6 GM/177ML SOLN once.     nitroGLYCERIN  (NITROSTAT ) 0.4 MG SL tablet Place 1 tablet (0.4 mg total) under the tongue every 5 (five) minutes x 3 doses as needed for chest pain. 25  tablet 3   omeprazole  (PRILOSEC) 40 MG capsule Take 1 capsule (40 mg total) by mouth 2 (two) times daily before a meal for 30 days, THEN 1 capsule (40 mg total) daily. 90 capsule 6   sulfamethoxazole -trimethoprim  (BACTRIM  DS) 800-160 MG tablet Take 1 tablet by mouth 3 (three) times a week. 12 tablet 6   tamsulosin  (FLOMAX ) 0.4 MG CAPS capsule Take 0.8 mg by mouth in the morning.     testosterone  cypionate (DEPOTESTOSTERONE CYPIONATE) 200 MG/ML injection Inject 1 mL (200 mg total) into the muscle every 14 (fourteen) days. 10 mL 1   tirzepatide  (ZEPBOUND ) 2.5 MG/0.5ML injection vial Inject 2.5 mg into the skin once a week. 2 mL 5   celecoxib  (CELEBREX ) 200 MG capsule Take 1-2 capsules (200-400 mg total) by mouth daily as needed. (Patient not taking: Reported on 11/11/2023) 180 capsule 0   predniSONE  (DELTASONE ) 10 MG tablet Take 2 tablets (20 mg total) by mouth daily with breakfast for 14 days, THEN 1 tablet (10 mg total) daily with breakfast for 14 days, THEN 0.5 tablets (5 mg total) daily with breakfast for 14 days. 49 tablet  0   No current facility-administered medications for this encounter.   Allergies  Allergen Reactions   Amoxicillin  Hives   Penicillins Itching, Rash, Dermatitis and Hives    Other reaction(s): Hives/Swelling-Allergy Childhood reaction. Tolerates cephalosporins.     Childhood reaction. Tolerates cephalosporins.   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    Other reaction(s): Other- (not listed) - Allergy Parson[place]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 Severe redness all over body with swollen ankles and hands P[place] - 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   Family History  Problem Relation Age of Onset   Alzheimer's disease Mother    Arthritis Father    Diabetes Brother    Hyperlipidemia Other    Colon cancer Neg Hx    Colon polyps Neg Hx    Esophageal cancer Neg Hx    Stomach cancer Neg Hx    Rectal cancer Neg Hx    ROS: All systems reviewed and negative except as per HPI.   Wt Readings from Last 3 Encounters:  12/12/23 106.5 kg (234 lb 12.8 oz)  11/12/23 102.5 kg (226 lb)  11/11/23 103.4 kg (228 lb)   BP (!) 152/80   Pulse 72   Ht 6' 1 (1.854 m)   Wt 106.5 kg (234 lb 12.8 oz)   SpO2 95%   BMI 30.98 kg/m   PHYSICAL EXAM: General: NAD Neck: No JVD, no thyromegaly or thyroid  nodule.  Lungs: Clear to auscultation bilaterally with normal respiratory effort. CV: Nondisplaced PMI.  Heart regular S1/S2, no S3/S4, no murmur.  No peripheral edema.  No carotid bruit.  Normal pedal pulses.  Abdomen: Soft, nontender, no hepatosplenomegaly, no distention.  Skin: Erythematous rash across chest, less prominent than in the past.  Neurologic: Alert and oriented x 3.  Psych: Normal affect. Extremities: No  clubbing or cyanosis.  HEENT: Normal.   ASSESSMENT & PLAN: 1. Chest wall rash: Bullous rash.  Skin biopsy and axillary lymph node biopsy at [facility] were consistent with intravascular histiocytosis.  This can be primary or secondary to rheumatoid arthritis or metallic prosthesis.  Patient has a prosthetic shoulder joint.  This is a rare condition, but can be treated with immunosuppression. He has been on methotrexate + hydroxychloroquine initially, now on mycophenolate  mofetil. ESR elevated at 48, CRP  elevated at 11.  ANA, RF, anti-CCP ab all checked at [facility] in the past and negative.  Based on past workup, this does not seem to be a sarcoidosis-type skin lesion. Patient had CTA chest with no hilar or mediastinal lymphadenopathy and no lung parenchymal changes consistent with sarcoidosis.  Repeat skin biopsy in 10/25 showed chronic granulomatous dermatitis, not typical for sarcoidosis. Chest wall rash significantly improved on prednisone .   - Continue mycophenolate  mofetil, has appt with dermatology at [facility] on 12/31/23.  - I am going to taper his prednisone , will decrease to 20 mg daily x 2 wks then 10 mg daily x 2 wks then 5 mg daily x 2 wks then stop.  He can stop Bactrim  prophylaxis when he cuts prednisone  down to 5 mg daily.  2. Cardiomyopathy: Lightheadedness with exertion in 10/25, found to have VT on stress echo and then PVCs with exertion.  LHC with nonobstructive CAD.  I reviewed the cardiac MRI, this showed LVEF 61% with moderate LVH (somewhat asymmetric in septum), normal RV size/systolic function, there was diffuse and somewhat subtle patchy mid-wall and subendocardial LGE from base=>apex, there was more prominent mid-wall LGE in the basal inferolateral wall, ECV 40%.  Unfortunately, differential is broad here.  Cardiac amyloidosis was a consideration with the LVH, diffuse and somewhat subtle LGE and elevated ECV, but this type of VT presentation is unusual and he does not have any other signs  (peripheral neuropathy, CTS, etc). Workup for cardiac amyloidosis was negative; negative PYP scan and negative myeloma studies.  Cardiac sarcoidosis could also look like this, though prior biopsy of lymph node and skin rash biopsy x 2 did not show sarcoidosis, and CT chest was not consistent with pulmonary sarcoidosis.  ACE level was normal.  He had been treated with immunosuppression that could be used to treat cardiac sarcoidosis for several months (methotrexate then mycophenolate  mofetil) but still had VT. Fabry's was a consideration but alpha galactosidase level was normal.  LGE pattern was not typical for a familial arrhythmogenic cardiomyopathy and no family history.  Hypertrophic cardiomyopathy is a definite consideration though he does not carry a family history (somewhat asymmetric LVH and LGE).  If the skin rash is from intravascular histiocytosis, we do not have a definite way to connect this to the cardiomyopathy.  At this point, I think that his cardiomyopathy is separate from his skin rash (?hypertrophic cardiomyopathy).  Currently, he is doing well with NYHA class I-II symptoms and no volume overload on exam.  - We are going to wean prednisone  as above but continue mycophenolate .  - I will send Prevention gene test for cardiomyopathies to see if he has any mutations associated with a genetic cardiomyopathy or HCM.  - Hold off on cardiac PET for inflammatory evaluation for now as  his is going to be confounded by the fact that he has been on prednisone  and mycophenolate  mofetil. - Check BNP.  3. VT: Has had VT and PVCs with exertion.  No recent ventricular arrhythmias.  - Continue amiodarone  and diltiazem  CD per EP.  With amiodarone , he will need LFTs and TSH today.  He will need a regular eye exam.  4. CAD: Nonobstructive on cath. No chest pain. - Continue statin.  - Continue ASA 81 daily.  5. CKD stage 3: BMET today.    This may end up being two separate diagnoses with intravascular  histiocytosis as skin lesion and a separate cardiomyopathy.  Followup 2 months.   I spent 41 minutes reviewing  records, interviewing/examining patient, and managing orders.   Ezra Shuck 12/13/23

## 2023-12-14 ENCOUNTER — Ambulatory Visit (HOSPITAL_COMMUNITY): Payer: Self-pay | Admitting: Cardiology

## 2023-12-15 ENCOUNTER — Telehealth (HOSPITAL_COMMUNITY): Payer: Self-pay | Admitting: *Deleted

## 2023-12-15 NOTE — Progress Notes (Unsigned)
 Cardiology Office Note:  .   Date:  12/15/2023  ID:  Chris Obrien, DOB 07-30-1950, MRN [id number] PCP: Garald Karlynn GAILS, MD  [facility] Providers Cardiologist:  Jerel Balding, MD {  History of Present Illness: .   Chris Obrien is a 73 y.o. 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  VT  9/27 with exertional dizziness and shortness of breath. There were no acute findings in the ED and patient was subsequently referred to outpatient cardiology. He underwent treadmill stress test on 10/17 and developed sustained VT. While in VT, had symptoms consistent with recent dizziness/dyspnea. Upon termination of the test, ECG shows spontaneous resolution of VT.  Ultimately referred to the ER/admitted for further evaluation. --  LHC on 10/27/23, found with mild to moderate non-obstructive disease, with 50% proximal LAD stenosis that was not hemodynamically significant by RFR, 0.91.  Otherwise 50 to 60% ostial ramus normal filling pressures  -- Cardiac MRI showed LVEF 61%, moderate to severe LVH (septum 2.0 cm thick) without systolic anterior motion, RV function okay, diffuse patchy LGE subendocardial and mid-myocardial LGE from base to apex, ECV 40%. Study suggestive of cardiac amyloidosis vs sarcoidosis.  -- He underwent PYP scan which showed no evidence of cardiac amyloidosis, myeloma workup negative.   --  Echo (10/25): EF 65-70%, G1DD, normal RV, IVC not dilated --  Alpha galactosidase level normal, ACE level normal.  -- Repeat skin biopsy of the chest was done, showing chronic granulomatous dermatitis, not typical for sarcoidosis.   -- Given suspicion for inflammatory cardiomyopathy, prednisone  was begun.   Give diffuse chest wall rash > no ICD  Life vest not felt needed, given he has no syncope, advised to avoid high levels of exertion  He saw Dr. Rolan 12/12/23, described rash as markedly improved on  prednisone  (rash not felt related to sarcoid based on extensive work up). Planned to start taper of his steroid > eventually off bactrim .  Lengthy discussion on differential and still yet uncertain diagnosis   Today's visit is scheduled as a post hospital visit ROS:   He comes accompanied by his wife. He reports that about 1.5 years ago had covid >> developed laong haul covid and never really felt like he got back towards his baseline. Prior to that he was walking 2 miles most days, riding his beke with excellent exertional capacity POST covid walking up the stairs made him lightheaded and SOB.  The day he went to the ER in Sept he was in a restroom, at the sink, felt a little lightheaded, dripped something and when he bent over to get it blacked out for just a moment, landed on one knee, never fell completely, but was startling. This has never happened before or again.  When on the treadmill for his stress test, did feel a little dizzy, but not near faint, did not feel like he was going to black out.  He has never had CP  Since home No palpitations He will occasionally feel a little dizzy but thinks that is the meds, doesn't feel like he did on the treadmill, no palps, no elevated HR alerts by his watch No near syncope or syncope Exercising getting his HRs towards the 110s', and does feel like he is gaining improved conditioning.  He has a terrible R shoulder, prior replacement and subsequent fracture. Pending a re-du surgery that sounds like it will be quite extensive He denies ever being  told or having suspect infection here  Chest wall rash is improved, not resolved  He is glad to be starting to taper his meds Hopes to get off as many as he can eventually   Arrhythmia/AAD hx VT, October 2025 > started on amiodarone   Studies Reviewed: SABRA    EKG not done today   Risk Assessment/Calculations:    Physical Exam:   VS:  There were no vitals taken for this visit.   Wt  Readings from Last 3 Encounters:  12/12/23 234 lb 12.8 oz (106.5 kg)  11/12/23 226 lb (102.5 kg)  11/11/23 228 lb (103.4 kg)    GEN: Well nourished, well developed in no acute distress NECK: No JVD; No carotid bruits CARDIAC: RRR, no murmurs, rubs, gallops RESPIRATORY:  CTA b/l without rales, wheezing or rhonchi  ABDOMEN: Soft, non-tender, non-distended EXTREMITIES: No edema; No deformity   ASSESSMENT AND PLAN: .    VT No symptoms to suggest recurrent Will maintain amiodarone  for now as he is tapering off steroids etc.  Chest wall rash remains, still with some blistering, and quite diffuse. I had not seen previously, appears significant though am told is markedly improved.  He denies the should as being infected (mentioned in his chart) > pending re-do surgery  For now: Continue amiodarone  200mg  daily as he is weaned off steroids and looks towards a shoulder surgery ~ January some time. Surveillance labs are UTD and WNL  Unclear +/- ICD  Will have him see Dr. Kennyth next to discuss this  ADEND: Dr. Kennyth felt more reasonable to see him after he has had his shoulder surgery/recovered from it to discuss magamenet strategies from EP perspective. Asked EP schedule to reach out to the patient, look towards mid feb or so for follow up with Dr Kennyth Chris Arthur, PA-C 12/16/23  CM ? Hypertrophic Pending genetics Volume stable C/w Dr. Marciano      Dispo: ~ 6 weeks, Dr. Acey, I have asked MD only to discuss need, +/- ICD, timing etc.   Signed, Chris Dauber Macario Arthur, PA-C

## 2023-12-15 NOTE — Telephone Encounter (Signed)
 Called patient per Dr. Rolan with following lab results and instructions:  Creatinine higher, may be due to Bactrim  use. Increase po fluid intake. Would recommend nephrology referral.   Pt verbalized understanding and agreement of same. Referral faxed to [facility] including last clinic note, labs with above recommendations, and demographics to:                              Phone: [phone]                              Fax: [phone]  Informed patient that office will be calling him to schedule first appointment.

## 2023-12-16 ENCOUNTER — Telehealth: Payer: Self-pay

## 2023-12-16 ENCOUNTER — Ambulatory Visit: Attending: Physician Assistant | Admitting: Physician Assistant

## 2023-12-16 VITALS — BP 124/62 | HR 75 | Ht 73.0 in | Wt 236.0 lb

## 2023-12-16 DIAGNOSIS — I429 Cardiomyopathy, unspecified: Secondary | ICD-10-CM | POA: Diagnosis present

## 2023-12-16 DIAGNOSIS — I472 Ventricular tachycardia, unspecified: Secondary | ICD-10-CM | POA: Diagnosis present

## 2023-12-16 MED ORDER — AMIODARONE HCL 200 MG PO TABS: 200.0000 mg | ORAL_TABLET | Freq: Every day | ORAL | 3 refills | Status: AC

## 2023-12-16 NOTE — Telephone Encounter (Signed)
 Spoke with Pharmacy though [facility] call. Wanted to make sure provider was aware patient was taking diltiazem  and amiodarone . Told them it looks like she is aware but I would send over to verify. Asked for them to go ahead a fill the prescription. They stated they would.

## 2023-12-16 NOTE — Patient Instructions (Signed)
 Medication Instructions:   Your physician recommends that you continue on your current medications as directed. Please refer to the Current Medication list given to you today.   *If you need a refill on your cardiac medications before your next appointment, please call your pharmacy*   Lab Work: NONE ORDERED  TODAY     If you have labs (blood work) drawn today and your tests are completely normal, you will receive your results only by: MyChart Message (if you have MyChart) OR A paper copy in the mail If you have any lab test that is abnormal or we need to change your treatment, we will call you to review the results.     Testing/Procedures: NONE ORDERED  TODAY      Follow-Up: At [facility], you and your health needs are our priority.  As part of our continuing mission to provide you with exceptional heart care, our providers are all part of one team.  This team includes your primary Cardiologist (physician) and Advanced Practice Providers or APPs (Physician Assistants and Nurse Practitioners) who all work together to provide you with the care you need, when you need it.   Your next appointment:  6 -week(s)   Provider:     Fonda Kitty, MD ( CONTACT  CASSIE HALL/ ANGELINE HAMMER FOR EP SCHEDULING ISSUES )     We recommend signing up for the patient portal called MyChart.  Sign up information is provided on this After Visit Summary.  MyChart is used to connect with patients for Virtual Visits (Telemedicine).  Patients are able to view lab/test results, encounter notes, upcoming appointments, etc.  Non-urgent messages can be sent to your provider as well.   To learn more about what you can do with MyChart, go to [email or web address].   Other Instructions

## 2023-12-17 ENCOUNTER — Telehealth: Payer: Self-pay | Admitting: Gastroenterology

## 2023-12-17 NOTE — Telephone Encounter (Addendum)
 Procedure:Colonoscopy/Endoscopy Procedure date: 12/25/23 Procedure location: [facility] Arrival Time: 8:00 am Spoke with the patient Y/N:   No, I left a detailed message on [phone] on 11/17/23 @ 11:24 am for the patient to return call  Yes 12/18/23 @ 2:26 pm  Any prep concerns? No  Has the patient obtained the prep from the pharmacy ? Yes Do you have a care partner and transportation: Yes Any additional concerns? No

## 2023-12-18 ENCOUNTER — Encounter (HOSPITAL_COMMUNITY): Payer: Self-pay | Admitting: Gastroenterology

## 2023-12-18 NOTE — Progress Notes (Signed)
 Alm Elks   PCP-Plotnikov MD Cardiologist-Croitoru MD Pulmonologist- n/a  EKG-12/12/23 Echo-10/27/23 Cath-10/27/23 Stress-11/12/23 ICD/PM-n/a GLP1-n/a  Blood Thinner-baby ASA  History:  OSA, BPH, CAD, Shoulder surgery with infection, Vtach, red man syndrome. Patient went to ED 9/27 for dizziness, SOB they did some testing but ordered outpt cardiology f/u. He went for stress test 10/17 and during first minutes developed Vtach and was sent to ED. Was admitted from 10/17-10/25. Since has seen cardiology last visit 12/9 with a f/u in 6 weeks (01/27/24). They did recommend he see a EP doc about poss ICD but spoke with them and they thought it could wait until after repeat shoulder surgery. Currently patient reports having no issues feeling really good says they found out the issue with Vtach and denies any palpitations/chest pains/sob. Anesthesia Review- Yes- if ok to wait to see EP until after shoulder, okay to have colonoscopy per anes. PA

## 2023-12-18 NOTE — Progress Notes (Signed)
 Attempted to obtain medical history for pre op call via telephone, unable to reach at this time. HIPAA compliant voicemail message left requesting return call to pre surgical testing department.

## 2023-12-24 ENCOUNTER — Encounter: Admitting: Gastroenterology

## 2023-12-24 ENCOUNTER — Encounter: Payer: Self-pay | Admitting: Internal Medicine

## 2023-12-25 ENCOUNTER — Ambulatory Visit (HOSPITAL_COMMUNITY): Admitting: Anesthesiology

## 2023-12-25 ENCOUNTER — Encounter (HOSPITAL_COMMUNITY): Admission: RE | Disposition: A | Payer: Self-pay | Attending: Gastroenterology

## 2023-12-25 ENCOUNTER — Encounter (HOSPITAL_COMMUNITY): Payer: Self-pay | Admitting: Gastroenterology

## 2023-12-25 ENCOUNTER — Ambulatory Visit (HOSPITAL_COMMUNITY)
Admission: RE | Admit: 2023-12-25 | Discharge: 2023-12-25 | Disposition: A | Discharge status: Home / Self Care | Attending: Gastroenterology | Admitting: Gastroenterology

## 2023-12-25 ENCOUNTER — Other Ambulatory Visit: Payer: Self-pay

## 2023-12-25 DIAGNOSIS — I10 Essential (primary) hypertension: Secondary | ICD-10-CM

## 2023-12-25 DIAGNOSIS — Z1211 Encounter for screening for malignant neoplasm of colon: Secondary | ICD-10-CM

## 2023-12-25 DIAGNOSIS — D122 Benign neoplasm of ascending colon: Secondary | ICD-10-CM | POA: Diagnosis not present

## 2023-12-25 DIAGNOSIS — I2510 Atherosclerotic heart disease of native coronary artery without angina pectoris: Secondary | ICD-10-CM

## 2023-12-25 DIAGNOSIS — K209 Esophagitis, unspecified without bleeding: Secondary | ICD-10-CM

## 2023-12-25 DIAGNOSIS — M1990 Unspecified osteoarthritis, unspecified site: Secondary | ICD-10-CM | POA: Insufficient documentation

## 2023-12-25 DIAGNOSIS — G4730 Sleep apnea, unspecified: Secondary | ICD-10-CM | POA: Diagnosis not present

## 2023-12-25 DIAGNOSIS — Z860101 Personal history of adenomatous and serrated colon polyps: Secondary | ICD-10-CM

## 2023-12-25 DIAGNOSIS — D123 Benign neoplasm of transverse colon: Secondary | ICD-10-CM

## 2023-12-25 DIAGNOSIS — Z87891 Personal history of nicotine dependence: Secondary | ICD-10-CM | POA: Insufficient documentation

## 2023-12-25 DIAGNOSIS — D124 Benign neoplasm of descending colon: Secondary | ICD-10-CM

## 2023-12-25 DIAGNOSIS — K449 Diaphragmatic hernia without obstruction or gangrene: Secondary | ICD-10-CM

## 2023-12-25 DIAGNOSIS — K31819 Angiodysplasia of stomach and duodenum without bleeding: Secondary | ICD-10-CM | POA: Diagnosis not present

## 2023-12-25 DIAGNOSIS — D509 Iron deficiency anemia, unspecified: Secondary | ICD-10-CM | POA: Diagnosis present

## 2023-12-25 DIAGNOSIS — K219 Gastro-esophageal reflux disease without esophagitis: Secondary | ICD-10-CM | POA: Insufficient documentation

## 2023-12-25 DIAGNOSIS — Z860100 Personal history of colon polyps, unspecified: Secondary | ICD-10-CM

## 2023-12-25 DIAGNOSIS — K3189 Other diseases of stomach and duodenum: Secondary | ICD-10-CM

## 2023-12-25 DIAGNOSIS — K2289 Other specified disease of esophagus: Secondary | ICD-10-CM | POA: Diagnosis not present

## 2023-12-25 DIAGNOSIS — K641 Second degree hemorrhoids: Secondary | ICD-10-CM | POA: Diagnosis not present

## 2023-12-25 DIAGNOSIS — D125 Benign neoplasm of sigmoid colon: Secondary | ICD-10-CM | POA: Diagnosis not present

## 2023-12-25 HISTORY — PX: POLYPECTOMY: SHX149

## 2023-12-25 HISTORY — PX: ESOPHAGOGASTRODUODENOSCOPY: SHX5428

## 2023-12-25 HISTORY — PX: HOT HEMOSTASIS: SHX5433

## 2023-12-25 HISTORY — PX: COLONOSCOPY: SHX5424

## 2023-12-25 SURGERY — COLONOSCOPY
Anesthesia: Monitor Anesthesia Care

## 2023-12-25 MED ORDER — PROPOFOL 500 MG/50ML IV EMUL
INTRAVENOUS | Status: DC | PRN
  Administered 2023-12-25: 09:00:00 175 ug/kg/min via INTRAVENOUS

## 2023-12-25 MED ORDER — SODIUM CHLORIDE 0.9 % IV SOLN
INTRAVENOUS | Status: AC | PRN
  Administered 2023-12-25: 08:00:00 500 mL via INTRAMUSCULAR

## 2023-12-25 NOTE — Anesthesia Postprocedure Evaluation (Signed)
 Anesthesia Post Note  Patient: Chris Obrien  Procedure(s) Performed: COLONOSCOPY EGD (ESOPHAGOGASTRODUODENOSCOPY) EGD, WITH ARGON PLASMA COAGULATION POLYPECTOMY, INTESTINE     Patient location during evaluation: PACU Anesthesia Type: MAC Level of consciousness: awake and alert Pain management: pain level controlled Vital Signs Assessment: post-procedure vital signs reviewed and stable Respiratory status: spontaneous breathing, nonlabored ventilation, respiratory function stable and patient connected to nasal cannula oxygen Cardiovascular status: stable and blood pressure returned to baseline Postop Assessment: no apparent nausea or vomiting Anesthetic complications: no   No notable events documented.  Last Vitals:  Vitals:   12/25/23 1010 12/25/23 1015  BP:  (!) 130/55  Pulse: 64 65  Resp: 17 19  Temp:    SpO2: 96% 94%    Last Pain:  Vitals:   12/25/23 1000  TempSrc: Temporal  PainSc: Asleep                 Epifanio Lamar BRAVO

## 2023-12-25 NOTE — Anesthesia Preprocedure Evaluation (Addendum)
 Anesthesia Evaluation  Patient identified by MRN, date of birth, ID band Patient awake    Reviewed: Allergy & Precautions, NPO status , Patient's Chart, lab work & pertinent test results  Airway Mallampati: III  TM Distance: >3 FB Neck ROM: Full    Dental   Pulmonary sleep apnea , former smoker   Pulmonary exam normal        Cardiovascular hypertension, Pt. on medications + CAD and + DOE  Normal cardiovascular exam  -  LHC on 10/27/23, found with mild to moderate non-obstructive disease, with 50% proximal LAD stenosis that was not hemodynamically significant by RFR, 0.91.  Otherwise 50 to 60% ostial ramus normal filling pressures  -- Cardiac MRI showed LVEF 61%, moderate to severe LVH (septum 2.0 cm thick) without systolic anterior motion, RV function okay, diffuse patchy LGE subendocardial and mid-myocardial LGE from base to apex, ECV 40%. Study suggestive of cardiac amyloidosis vs sarcoidosis.  -- He underwent PYP scan which showed no evidence of cardiac amyloidosis, myeloma workup negative.   --  Echo (10/25): EF 65-70%, G1DD, normal RV, IVC not dilated    Neuro/Psych  Neuromuscular disease    GI/Hepatic Neg liver ROS,GERD  ,,  Endo/Other  negative endocrine ROS    Renal/GU negative Renal ROS     Musculoskeletal  (+) Arthritis ,    Abdominal   Peds  Hematology  (+) Blood dyscrasia, anemia   Anesthesia Other Findings   Reproductive/Obstetrics                              Anesthesia Physical Anesthesia Plan  ASA: 3  Anesthesia Plan: MAC   Post-op Pain Management:    Induction:   PONV Risk Score and Plan: 1 and Propofol  infusion  Airway Management Planned: Natural Airway, Nasal Cannula and Simple Face Mask  Additional Equipment:   Intra-op Plan:   Post-operative Plan:   Informed Consent: I have reviewed the patients History and Physical, chart, labs and discussed the procedure  including the risks, benefits and alternatives for the proposed anesthesia with the patient or authorized representative who has indicated his/her understanding and acceptance.       Plan Discussed with:   Anesthesia Plan Comments:         Anesthesia Quick Evaluation

## 2023-12-25 NOTE — Transfer of Care (Signed)
 Immediate Anesthesia Transfer of Care Note  Patient: Chris Obrien  Procedure(s) Performed: COLONOSCOPY EGD (ESOPHAGOGASTRODUODENOSCOPY) EGD, WITH ARGON PLASMA COAGULATION POLYPECTOMY, INTESTINE  Patient Location: PACU  Anesthesia Type:MAC  Level of Consciousness: sedated, patient cooperative, and responds to stimulation  Airway & Oxygen Therapy: Patient connected to face mask oxygen  Post-op Assessment: Report given to RN and Post -op Vital signs reviewed and stable  Post vital signs: Reviewed and stable  Last Vitals:  Vitals Value Taken Time  BP    Temp    Pulse 57 12/25/23 09:57  Resp 14 12/25/23 09:57  SpO2 98 % 12/25/23 09:57  Vitals shown include unfiled device data.  Last Pain:  Vitals:   12/25/23 0805  TempSrc: Temporal  PainSc: 0-No pain         Complications: No notable events documented.

## 2023-12-25 NOTE — H&P (Signed)
 GASTROENTEROLOGY PROCEDURE H&P NOTE   Primary Care Physician: Garald Karlynn GAILS, MD  HPI: Chris Obrien is a 72 y.o. male who presents for EGD/Colonoscopy for followup of esophagitis and anemia and colon polyp surveillance.  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    OSA on CPAP    PT LOST  WEIGHT - NO LONGER USING CPAP   Pain    RIGHT SHOULDER DELTOID MUSCLE ATROPHY - LOSS OF STRENGT VERY LIMITED RANGE OF MOTTION   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   Right knee skin infection    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   Sleep apnea    weight loss so no cpap now    Swelling of joint of right knee    Tachycardia 10/2023   Urticaria    Past Surgical History:  Procedure Laterality Date   ADENOIDECTOMY     COLONOSCOPY     CORONARY PRESSURE/FFR STUDY N/A 10/27/2023   Procedure: CORONARY PRESSURE/FFR STUDY;  Surgeon: Mady Bruckner, MD;  Location: [facility];  Service: Cardiovascular;  Laterality: N/A;   EXCISIONAL TOTAL KNEE ARTHROPLASTY WITH ANTIBIOTIC SPACERS Right 04/27/2012   Procedure: EXCISIONAL TOTAL KNEE ARTHROPLASTY WITH ANTIBIOTIC SPACERS;  Surgeon: Donnice JONETTA Car, MD;  Location: [facility];  Service: Orthopedics;  Laterality: Right;  SAME DAY LABS   FRACTURE SURGERY  12/2020   IRRIGATION AND DEBRIDEMENT KNEE Right 04/02/2012   Procedure: IRRIGATION AND DEBRIDEMENT RIGHT PRE-PATELLA  ;  Surgeon: Donnice JONETTA Car,  MD;  Location: [facility];  Service: Orthopedics;  Laterality: Right;   JOINT REPLACEMENT  [id number]   KNEE ARTHROSCOPY Right 08/2010   LEFT HEART CATH AND CORONARY ANGIOGRAPHY N/A 10/27/2023   Procedure: LEFT HEART CATH AND CORONARY ANGIOGRAPHY;  Surgeon: Mady Bruckner, MD;  Location: [facility];  Service: Cardiovascular;  Laterality: N/A;   POLYPECTOMY     SINOSCOPY     TONSILLECTOMY     TOTAL KNEE ARTHROPLASTY  01/28/2012   Procedure: TOTAL KNEE ARTHROPLASTY;  Surgeon: Donnice JONETTA Car, MD;  Location: [facility];  Service: Orthopedics;  Laterality: Right;   TOTAL KNEE ARTHROPLASTY Left 07/17/2015   Procedure: LEFT TOTAL KNEE ARTHROPLASTY;  Surgeon: Donnice Car, MD;  Location: [facility];  Service: Orthopedics;  Laterality: Left;   TOTAL KNEE REVISION Right 06/15/2012   Procedure: reimplantation of right total knee ;  Surgeon: Donnice JONETTA Car, MD;  Location: [facility];  Service: Orthopedics;  Laterality: Right;   TOTAL SHOULDER REPLACEMENT Bilateral    done in [facility]    ULNAR NERVE REPAIR Right 2008   No current facility-administered medications for this encounter.   Current Medications[1] Allergies[2] Family History  Problem Relation Age of Onset   Alzheimer's disease Mother    Arthritis Father    Diabetes Brother    Hyperlipidemia Other    Colon cancer Neg Hx    Colon polyps Neg Hx    Esophageal cancer Neg Hx  Stomach cancer Neg Hx    Rectal cancer Neg Hx    Social History   Socioeconomic History   Marital status: Married    Spouse name: Not on file   Number of children: Not on file   Years of education: 16   Highest education level: Bachelor's degree (e.g., BA, AB, BS)  Occupational History   Occupation: production designer, theatre/television/film    Comment: Marketing Executive   Occupation: MANAGER    Employer: [employer]  Tobacco Use   Smoking status: Former    Types: Cigars    Quit date: 1990    Years since quitting: 35.9   Smokeless tobacco: Never  Vaping Use   Vaping  status: Never Used  Substance and Sexual Activity   Alcohol use: Not Currently    Comment: occasionally   Drug use: No   Sexual activity: Yes    Birth control/protection: None  Other Topics Concern   Not on file  Social History Narrative   ** Merged History Encounter **       Caffeine 4 cups avg daily, 4 cans soda.   Social Drivers of Health   Tobacco Use: Medium Risk (12/25/2023)   Patient History    Smoking Tobacco Use: Former    Smokeless Tobacco Use: Never    Passive Exposure: Not on file  Financial Resource Strain: Low Risk (11/13/2023)   Received from [facility]   Overall Financial Resource Strain (CARDIA)    How hard is it for you to pay for the very basics like food, housing, medical care, and heating?: Not hard at all  Food Insecurity: No Food Insecurity (11/13/2023)   Received from [facility]   Epic    Within the past 12 months, you worried that your food would run out before you got the money to buy more.: Never true    Within the past 12 months, the food you bought just didn't last and you didn't have money to get more.: Never true  Transportation Needs: No Transportation Needs (11/13/2023)   Received from [facility]    In the past 12 months, has lack of transportation kept you from medical appointments or from getting medications?: No    In the past 12 months, has lack of transportation kept you from meetings, work, or from getting things needed for daily living?: No  Physical Activity: Sufficiently Active (08/22/2023)   Exercise Vital Sign    Days of Exercise per Week: 4 days    Minutes of Exercise per Session: 40 min  Stress: No Stress Concern Present (08/22/2023)   [employer] of Occupational Health - Occupational Stress Questionnaire    Feeling of Stress: Not at all  Social Connections: Moderately Integrated (10/25/2023)   Social Connection and Isolation Panel    Frequency of Communication with Friends and Family: More than three times a  week    Frequency of Social Gatherings with Friends and Family: Three times a week    Attends Religious Services: Never    Active Member of Clubs or Organizations: Yes    Attends Banker Meetings: Never    Marital Status: Married  Catering Manager Violence: Not At Risk (10/25/2023)   Epic    Fear of Current or Ex-Partner: No    Emotionally Abused: No    Physically Abused: No    Sexually Abused: No  Depression (PHQ2-9): Low Risk (08/22/2023)   Depression (PHQ2-9)    PHQ-2 Score: 0  Alcohol Screen: Low Risk (08/22/2023)  Alcohol Screen    Last Alcohol Screening Score (AUDIT): 0  Housing: Low Risk (11/13/2023)   Received from [facility]    In the last 12 months, was there a time when you were not able to pay the mortgage or rent on time?: No    In the past 12 months, how many times have you moved where you were living?: 1    At any time in the past 12 months, were you homeless or living in a shelter (including now)?: No  Utilities: Not At Risk (11/13/2023)   Received from [facility]    In the past 12 months has the electric, gas, oil, or water  company threatened to shut off services in your home?: No  Health Literacy: Adequate Health Literacy (08/22/2023)   B1300 Health Literacy    Frequency of need for help with medical instructions: Never    Physical Exam: There were no vitals filed for this visit. There is no height or weight on file to calculate BMI. GEN: NAD EYE: Sclerae anicteric ENT: MMM CV: Non-tachycardic GI: Soft, NT/ND NEURO:  Alert & Oriented x 3  Lab Results: No results for input(s): WBC, HGB, HCT, PLT in the last 72 hours. BMET No results for input(s): NA, K, CL, CO2, GLUCOSE, BUN, CREATININE, CALCIUM  in the last 72 hours. LFT No results for input(s): PROT, ALBUMIN, AST, ALT, ALKPHOS, BILITOT, BILIDIR, IBILI in the last 72 hours. PT/INR No results for input(s): LABPROT, INR in the  last 72 hours.   Impression / Plan: This is a 73 y.o.male who presents for EGD/Colonoscopy for followup of esophagitis and anemia and colon polyp surveillance.  The risks and benefits of endoscopic evaluation/treatment were discussed with the patient and/or family; these include but are not limited to the risk of perforation, infection, bleeding, missed lesions, lack of diagnosis, severe illness requiring hospitalization, as well as anesthesia and sedation related illnesses.  The patient's history has been reviewed, patient examined, no change in status, and deemed stable for procedure.  The patient and/or family was provided an opportunity to ask questions and all were answered.  The patient and/or family is agreeable to proceed.    Aloha Finner, MD [facility] Advanced [facility]oscopy Office # [phone]     [1] No current facility-administered medications for this encounter. [2]  Allergies Allergen Reactions   Amoxicillin  Hives   Penicillins Itching, Rash, Dermatitis and Hives    Other reaction(s): Hives/Swelling-Allergy Childhood reaction. Tolerates cephalosporins.     Childhood reaction. Tolerates cephalosporins.   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    Other reaction(s): Other- (not listed) - Allergy [place]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

## 2023-12-25 NOTE — Discharge Instructions (Signed)

## 2023-12-25 NOTE — Op Note (Signed)
 [facility] Patient Name: Chris Obrien Procedure Date: 12/25/2023 MRN: [id number] Attending MD: Aloha Finner , MD, [id number] Date of Birth: 1950/03/27 CSN: [id number] Age: 73 Admit Type: Ambulatory Procedure:                Colonoscopy Indications:              High risk colon cancer surveillance: Personal                            history of colonic polyps, Incidental - Iron                            deficiency anemia Providers:                Aloha Finner, MD, Clotilda Schmitz, RN, Fairy Marina, Technician Referring MD:              Medicines:                Monitored Anesthesia Care Complications:            No immediate complications. Estimated Blood Loss:     Estimated blood loss was minimal. Procedure:                Pre-Anesthesia Assessment:                           - Prior to the procedure, a History and Physical                            was performed, and patient medications and                            allergies were reviewed. The patient's tolerance of                            previous anesthesia was also reviewed. The risks                            and benefits of the procedure and the sedation                            options and risks were discussed with the patient.                            All questions were answered, and informed consent                            was obtained. Prior Anticoagulants: The patient has                            taken no anticoagulant or antiplatelet agents                            except for aspirin . ASA  Grade Assessment: III - A                            patient with severe systemic disease. After                            reviewing the risks and benefits, the patient was                            deemed in satisfactory condition to undergo the                            procedure.                           After obtaining informed consent, the colonoscope                             was passed under direct vision. Throughout the                            procedure, the patient's blood pressure, pulse, and                            oxygen saturations were monitored continuously. The                            CF-HQ190L ([id number]) Olympus colonoscope was                            introduced through the anus and advanced to the the                            cecum, identified by appendiceal orifice and                            ileocecal valve. The colonoscopy was performed                            without difficulty. The patient tolerated the                            procedure. The quality of the bowel preparation was                            adequate. The ileocecal valve, appendiceal orifice,                            and rectum were photographed. Scope In: 9:27:52 AM Scope Out: 9:51:15 AM Scope Withdrawal Time: 0 hours 20 minutes 21 seconds  Total Procedure Duration: 0 hours 23 minutes 23 seconds  Findings:      The digital rectal exam findings include hemorrhoids. Pertinent       negatives include no palpable rectal lesions.      The colon (entire examined portion) revealed  significantly excessive       looping.      A moderate amount of semi-liquid stool was found in the entire colon,       interfering with visualization. Lavage of the area was performed using       copious amounts, resulting in clearance with adequate visualization.      11 sessile polyps were found in the sigmoid colon (1), descending colon       (1), transverse colon (3) and ascending colon (6). The polyps were 2 to       8 mm in size. These polyps were removed with a cold snare. Resection and       retrieval were complete.      Normal mucosa was found in the entire colon otherwise.      Non-bleeding non-thrombosed internal hemorrhoids were found during       retroflexion, during perianal exam and during digital exam. The       hemorrhoids were Grade II  (internal hemorrhoids that prolapse but reduce       spontaneously). Impression:               - Hemorrhoids found on digital rectal exam.                           - There was significant looping of the colon.                           - Stool in the entire examined colon. Lavaged with                            adequate visualization.                           - 11, 2 to 8 mm polyps in the sigmoid colon, in the                            descending colon, in the transverse colon and in                            the ascending colon, removed with a cold snare.                            Resected and retrieved.                           - Normal mucosa in the entire examined colon                            otherwise.                           - Non-bleeding non-thrombosed internal hemorrhoids. Moderate Sedation:      Not Applicable - Patient had care per Anesthesia. Recommendation:           - The patient will be observed post-procedure,                            until all  discharge criteria are met.                           - Discharge patient to home.                           - Patient has a contact number available for                            emergencies. The signs and symptoms of potential                            delayed complications were discussed with the                            patient. Return to normal activities tomorrow.                            Written discharge instructions were provided to the                            patient.                           - High fiber diet.                           - Use FiberCon 1-2 tablets PO daily.                           - Continue present medications.                           - If issues of IDA persist on recheck, will need to                            consider VCE.                           - Await pathology results.                           - Repeat colonoscopy in 1 year for surveillance                             based on pathology results if at least 10 of these                            are precancerous if not then likely 3-years for                            followup (pending health at the time).                           - The findings and recommendations were discussed  with the patient.                           - The findings and recommendations were discussed                            with the patient's family. Procedure Code(s):        --- Professional ---                           [phone], Colonoscopy, flexible; with removal of                            tumor(s), polyp(s), or other lesion(s) by snare                            technique Diagnosis Code(s):        --- Professional ---                           Z86.010, Personal history of colonic polyps                           K64.1, Second degree hemorrhoids                           D12.5, Benign neoplasm of sigmoid colon                           D12.4, Benign neoplasm of descending colon                           D12.3, Benign neoplasm of transverse colon (hepatic                            flexure or splenic flexure)                           D12.2, Benign neoplasm of ascending colon CPT copyright 2022 American [employer] Association. All rights reserved. The codes documented in this report are preliminary and upon coder review may  be revised to meet current compliance requirements. Aloha Finner, MD 12/25/2023 10:14:29 AM Number of Addenda: 0

## 2023-12-25 NOTE — Op Note (Signed)
 [facility] Patient Name: Steffan Caniglia Procedure Date: 12/25/2023 MRN: [id number] Attending MD: Aloha Finner , MD, [id number] Date of Birth: 1950-10-05 CSN: [id number] Age: 73 Admit Type: Ambulatory Procedure:                Upper GI endoscopy Indications:              Iron deficiency anemia, Follow-up of esophagitis Providers:                Aloha Finner, MD, Clotilda Schmitz, RN, Fairy Marina, Technician Referring MD:              Medicines:                Monitored Anesthesia Care Complications:            No immediate complications. Estimated Blood Loss:     Estimated blood loss was minimal. Procedure:                Pre-Anesthesia Assessment:                           - Prior to the procedure, a History and Physical                            was performed, and patient medications and                            allergies were reviewed. The patient's tolerance of                            previous anesthesia was also reviewed. The risks                            and benefits of the procedure and the sedation                            options and risks were discussed with the patient.                            All questions were answered, and informed consent                            was obtained. Prior Anticoagulants: The patient has                            taken no anticoagulant or antiplatelet agents                            except for aspirin . ASA Grade Assessment: III - A                            patient with severe systemic disease. After  reviewing the risks and benefits, the patient was                            deemed in satisfactory condition to undergo the                            procedure.                           After obtaining informed consent, the endoscope was                            passed under direct vision. Throughout the                             procedure, the patient's blood pressure, pulse, and                            oxygen saturations were monitored continuously. The                            [id number]-[id number] ([id number]) Olympus endoscope was introduced                            through the mouth, and advanced to the second part                            of duodenum. The upper GI endoscopy was                            accomplished without difficulty. The patient                            tolerated the procedure. Scope In: Scope Out: Findings:      No gross lesions were noted in the entire esophagus.      The Z-line was irregular and was found 42 cm from the incisors.      A 2 cm hiatal hernia was present.      Patchy mildly erythematous mucosa without bleeding was found in the       entire examined stomach (improved from prior and previously negative for       HP so not rebiopsied).      Mild gastric antral vascular ectasia without bleeding was present in the       gastric antrum and in the prepyloric region of the stomach. Fulguration       to ablate the lesion to prevent bleeding by argon plasma was successful.      No gross lesions were noted in the duodenal bulb, in the first portion       of the duodenum and in the second portion of the duodenum.      The major papilla was normal. Impression:               - No gross lesions in the entire esophagus. Z-line  irregular.                           - 2 cm hiatal hernia.                           - Erythematous mucosa in the stomach - improved                            from previous EGD.                           - Gastric antral vascular ectasia without bleeding.                            Treated with argon plasma coagulation (APC).                           - No gross lesions in the duodenal bulb, in the                            first portion of the duodenum and in the second                            portion of the duodenum.                            - Normal major papilla. Moderate Sedation:      Not Applicable - Patient had care per Anesthesia. Recommendation:           - Proceed to scheduled colonoscopy.                           - Observe patient's clinical course.                           - Continue PPI BID at current dosing for 6-months.                            May then decrease to once daily if symptoms from a                            GERD standpoint are OK and maintain.                           - The findings and recommendations were discussed                            with the patient.                           - The findings and recommendations were discussed                            with the patient's family. Procedure Code(s):        ---  Professional ---                           [phone], Esophagogastroduodenoscopy, flexible,                            transoral; with control of bleeding, any method Diagnosis Code(s):        --- Professional ---                           K22.89, Other specified disease of esophagus                           K44.9, Diaphragmatic hernia without obstruction or                            gangrene                           K31.89, Other diseases of stomach and duodenum                           K31.819, Angiodysplasia of stomach and duodenum                            without bleeding                           D50.9, Iron deficiency anemia, unspecified                           K20.90, Esophagitis, unspecified without bleeding CPT copyright 2022 American Medical Association. All rights reserved. The codes documented in this report are preliminary and upon coder review may  be revised to meet current compliance requirements. Aloha Finner, MD 12/25/2023 10:08:00 AM Number of Addenda: 0

## 2023-12-26 LAB — SURGICAL PATHOLOGY

## 2023-12-27 ENCOUNTER — Ambulatory Visit: Payer: Self-pay | Admitting: Gastroenterology

## 2023-12-28 ENCOUNTER — Encounter (HOSPITAL_COMMUNITY): Payer: Self-pay | Admitting: Gastroenterology

## 2023-12-29 ENCOUNTER — Telehealth (HOSPITAL_COMMUNITY): Payer: Self-pay

## 2023-12-29 ENCOUNTER — Other Ambulatory Visit: Payer: Self-pay | Admitting: Family

## 2023-12-29 DIAGNOSIS — I5032 Chronic diastolic (congestive) heart failure: Secondary | ICD-10-CM

## 2023-12-29 DIAGNOSIS — I429 Cardiomyopathy, unspecified: Secondary | ICD-10-CM

## 2023-12-29 MED ORDER — TESTOSTERONE CYPIONATE 200 MG/ML IM SOLN: 200.0000 mg | INTRAMUSCULAR | 1 refills | Status: AC

## 2023-12-29 NOTE — Telephone Encounter (Signed)
 Called patient with results of his genetic testing. Per Dr. Rolan he would like him referred to Dr. Fairy. Referral placed

## 2024-01-05 ENCOUNTER — Encounter: Payer: Self-pay | Admitting: Cardiovascular Disease

## 2024-01-09 ENCOUNTER — Other Ambulatory Visit (HOSPITAL_COMMUNITY): Payer: Self-pay | Admitting: Cardiology

## 2024-01-16 ENCOUNTER — Telehealth (HOSPITAL_COMMUNITY): Payer: Self-pay

## 2024-01-16 ENCOUNTER — Other Ambulatory Visit (HOSPITAL_COMMUNITY): Payer: Self-pay

## 2024-01-16 ENCOUNTER — Other Ambulatory Visit

## 2024-01-16 ENCOUNTER — Other Ambulatory Visit: Payer: Self-pay

## 2024-01-16 DIAGNOSIS — R7989 Other specified abnormal findings of blood chemistry: Secondary | ICD-10-CM

## 2024-01-16 LAB — RENAL FUNCTION PANEL
Albumin: 3.8 g/dL (ref 3.5–5.2)
BUN: 23 mg/dL (ref 6–23)
CO2: 30 meq/L (ref 19–32)
Calcium: 8.7 mg/dL (ref 8.4–10.5)
Chloride: 101 meq/L (ref 96–112)
Creatinine, Ser: 1.75 mg/dL — ABNORMAL HIGH (ref 0.40–1.50)
GFR: 37.6 mL/min — ABNORMAL LOW
Glucose, Bld: 121 mg/dL — ABNORMAL HIGH (ref 70–99)
Phosphorus: 3.1 mg/dL (ref 2.3–4.6)
Potassium: 4.7 meq/L (ref 3.5–5.1)
Sodium: 138 meq/L (ref 135–145)

## 2024-01-16 LAB — COMPREHENSIVE METABOLIC PANEL WITH GFR
ALT: 23 U/L (ref 3–53)
AST: 22 U/L (ref 5–37)
Albumin: 3.8 g/dL (ref 3.5–5.2)
Alkaline Phosphatase: 95 U/L (ref 39–117)
BUN: 23 mg/dL (ref 6–23)
CO2: 30 meq/L (ref 19–32)
Calcium: 8.7 mg/dL (ref 8.4–10.5)
Chloride: 101 meq/L (ref 96–112)
Creatinine, Ser: 1.75 mg/dL — ABNORMAL HIGH (ref 0.40–1.50)
GFR: 37.6 mL/min — ABNORMAL LOW
Glucose, Bld: 121 mg/dL — ABNORMAL HIGH (ref 70–99)
Potassium: 4.7 meq/L (ref 3.5–5.1)
Sodium: 138 meq/L (ref 135–145)
Total Bilirubin: 0.6 mg/dL (ref 0.2–1.2)
Total Protein: 6.4 g/dL (ref 6.0–8.3)

## 2024-01-16 LAB — CBC WITH DIFFERENTIAL/PLATELET
Basophils Absolute: 0 K/uL (ref 0.0–0.1)
Basophils Relative: 0.2 % (ref 0.0–3.0)
Eosinophils Absolute: 0 K/uL (ref 0.0–0.7)
Eosinophils Relative: 0 % (ref 0.0–5.0)
HCT: 43.7 % (ref 39.0–52.0)
Hemoglobin: 14.6 g/dL (ref 13.0–17.0)
Lymphocytes Relative: 14.8 % (ref 12.0–46.0)
Lymphs Abs: 1 K/uL (ref 0.7–4.0)
MCHC: 33.3 g/dL (ref 30.0–36.0)
MCV: 83.2 fl (ref 78.0–100.0)
Monocytes Absolute: 0.5 K/uL (ref 0.1–1.0)
Monocytes Relative: 8 % (ref 3.0–12.0)
Neutro Abs: 5 K/uL (ref 1.4–7.7)
Neutrophils Relative %: 77 % (ref 43.0–77.0)
Platelets: 379 K/uL (ref 150.0–400.0)
RBC: 5.25 Mil/uL (ref 4.22–5.81)
RDW: 20.3 % — ABNORMAL HIGH (ref 11.5–15.5)
WBC: 6.5 K/uL (ref 4.0–10.5)

## 2024-01-16 NOTE — Telephone Encounter (Signed)
" °  [facility]   Pre-operative Risk Assessment   Request for Surgical Clearance{  Procedure:  Fusion Prostate Biopsy  Date of Surgery:  Clearance TBD                             {  { Surgeon:  Sherwood Edison, MD Surgeon's Group or Practice Name:  [facility]  Phone number:  [phone] Fax number:  [phone]  Type of Clearance Requested:   - Medical  - Pharmacy:  Hold Aspirin  Can this be held for 5 days prior to the procedure { Type of Anesthesia:  Not listed  { Additional requests/questions:  Please advise surgeon/provider what medications should be held. Please fax a copy of Clearance to the surgeon's office.  Signed, Cutler Sunday B Tymere Depuy   01/16/2024, 10:04 AM   [facility] "

## 2024-01-19 LAB — LAB REPORT - SCANNED: EGFR: 43

## 2024-01-20 ENCOUNTER — Other Ambulatory Visit: Payer: Self-pay

## 2024-01-20 DIAGNOSIS — N1832 Chronic kidney disease, stage 3b: Secondary | ICD-10-CM

## 2024-01-20 NOTE — Telephone Encounter (Signed)
 [place], [place], [name]  You18 hours ago (2:21 PM)    OK from CV standpoint for procedure, RCRI score 1 pt (1.1% risk of major cardiac event). OK to hold ASA 5 days before procedure, would resume as soon as safe to do so, post-op   Copy of clearance with provider recommendations faxed to requesting office via Epic fax function.

## 2024-01-22 ENCOUNTER — Telehealth: Payer: Self-pay

## 2024-01-22 ENCOUNTER — Other Ambulatory Visit (INDEPENDENT_AMBULATORY_CARE_PROVIDER_SITE_OTHER)

## 2024-01-22 DIAGNOSIS — D509 Iron deficiency anemia, unspecified: Secondary | ICD-10-CM | POA: Diagnosis not present

## 2024-01-22 LAB — CBC WITH DIFFERENTIAL/PLATELET
Basophils Absolute: 0 K/uL (ref 0.0–0.1)
Basophils Relative: 0.3 % (ref 0.0–3.0)
Eosinophils Absolute: 0 K/uL (ref 0.0–0.7)
Eosinophils Relative: 0 % (ref 0.0–5.0)
HCT: 40.5 % (ref 39.0–52.0)
Hemoglobin: 13.6 g/dL (ref 13.0–17.0)
Lymphocytes Relative: 17.3 % (ref 12.0–46.0)
Lymphs Abs: 1.3 K/uL (ref 0.7–4.0)
MCHC: 33.6 g/dL (ref 30.0–36.0)
MCV: 83.9 fl (ref 78.0–100.0)
Monocytes Absolute: 0.6 K/uL (ref 0.1–1.0)
Monocytes Relative: 7.7 % (ref 3.0–12.0)
Neutro Abs: 5.6 K/uL (ref 1.4–7.7)
Neutrophils Relative %: 74.7 % (ref 43.0–77.0)
Platelets: 433 K/uL — ABNORMAL HIGH (ref 150.0–400.0)
RBC: 4.83 Mil/uL (ref 4.22–5.81)
RDW: 19 % — ABNORMAL HIGH (ref 11.5–15.5)
WBC: 7.5 K/uL (ref 4.0–10.5)

## 2024-01-22 LAB — IBC + FERRITIN
Ferritin: 145.7 ng/mL (ref 22.0–322.0)
Iron: 29 ug/dL — ABNORMAL LOW (ref 42–165)
Saturation Ratios: 9.7 % — ABNORMAL LOW (ref 20.0–50.0)
TIBC: 298.2 ug/dL (ref 250.0–450.0)
Transferrin: 213 mg/dL (ref 212.0–360.0)

## 2024-01-22 LAB — IGG, IGA, IGM
IgG (Immunoglobin G), Serum: 484 mg/dL — ABNORMAL LOW (ref 600–1540)
IgM, Serum: 25 mg/dL — ABNORMAL LOW (ref 50–300)
Immunoglobulin A: 162 mg/dL (ref 70–320)

## 2024-01-22 LAB — C4 COMPLEMENT: C4 Complement: 31 mg/dL (ref 15–53)

## 2024-01-22 LAB — IGE: IgE (Immunoglobulin E), Serum: 27 kU/L

## 2024-01-22 LAB — VITAMIN B12: Vitamin B-12: 458 pg/mL (ref 211–911)

## 2024-01-22 LAB — C3 COMPLEMENT: C3 Complement: 121 mg/dL (ref 82–185)

## 2024-01-22 LAB — FOLATE: Folate: >23.4 ng/mL

## 2024-01-22 LAB — ANTI-DNA ANTIBODY, DOUBLE-STRANDED: ds DNA Ab: <1 [IU]/mL

## 2024-01-22 NOTE — Telephone Encounter (Signed)
-----   Message from Aloha Finner, MD sent at 12/25/2023 11:20 AM EST ----- Regarding: Followup Chris Obrien, This patient needs CBC/Iron/TIBC/Ferritin/B12/Folate in 4-weeks. If still IDA, then will require VCE to be pursued. Thanks. GM

## 2024-01-22 NOTE — Telephone Encounter (Signed)
 Lab order has been entered- pt advised via My Chart- he does view messages

## 2024-01-26 NOTE — Progress Notes (Unsigned)
 " Electrophysiology Office Note:   Date:  01/28/2024  ID:  Chris Obrien, DOB Oct 13, 1950, MRN [id number]  Primary Cardiologist: Jerel Balding, MD Electrophysiologist: Fonda Kitty, MD      History of Present Illness:   Chris Obrien is a 74 y.o. 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  Discussed the use of AI scribe software for clinical note transcription with the patient, who gave verbal consent to proceed.  History of Present Illness Chris Obrien is a 74 year old male with ventricular tachycardia and heart scarring who presents for evaluation of the need for an implantable cardioverter-defibrillator (ICD). He was referred by Dr. Charlies for evaluation of ventricular tachycardia and potential ICD placement.  He has a history of ventricular tachycardia and heart scarring. He has not experienced any recent episodes of VT. His heart rate occasionally spikes during work, reaching a high of 115 bpm, and can go up to 130-140 bpm when biking, which he notes is normal during exertion. He is currently on amiodarone .  He underwent genetic testing, which returned inconclusive results. An MRI of his heart showed some scarring, but it was not definitive for a specific condition. He has not experienced any fainting episodes recently, which he previously had before his initial hospital visit.  He is also dealing with a shoulder issue for which he is awaiting surgery. He experiences night sweats, which he believes may be related to his shoulder condition. He has consulted multiple doctors regarding a potential nickel allergy related to his shoulder implant, and plans to have it replaced with titanium.  His blood pressure has varied in the past, with readings as low as 110/49 mmHg and as high as 180/110 mmHg. He has not had any recent episodes of low blood pressure or  fainting.  Recent tests showed normal liver and thyroid  function. He is scheduled for a kidney ultrasound today.    Review of systems complete and found to be negative unless listed in HPI.   EP Information / Studies Reviewed:    EKG is not ordered today. EKG from 12/12/23 reviewed which showed SR with PR and QRS 80ms.    [facility]io 10/2023    Cardiac MRI 10/28/23: CONCLUSIONS: 1.  Moderate to severe LVH with preserved LV systolic function.   2. There is diffuse yet subtle late gadolinium enhancement of the subendocardium and mid-myocardium with prolonged T1 and ECV values with a more prominent focus of mid-myocardial LGE in the basal lateral wall. 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 Sarcoidosis is on the differential as well, but seems less likely.   4. Recommend screening for cardiac amyloid to confirm the diagnosis.   5. Recommend dedicated chest CT imaging to better assess the right lung nodule.  10/27/23 LHC: Conclusions: Mild-moderate, nonobstructive coronary artery disease, as detailed below, including 50% proximal LAD stenosis that is not hemodynamically significant (RFR = 0.91), 50-60% ostial ramus intermedius lesion, and mild luminal irregularities in the LCx and RCA. Normal left ventricular filling pressure (LVEDP 8 mmHg).    Physical Exam:   VS:  BP 136/84   Pulse 68   Ht 6' (1.829 m)   Wt 234 lb 3.2 oz (106.2 kg)   SpO2 96%   BMI 31.76 kg/m    Wt Readings from Last 3 Encounters:  01/27/24 234 lb 3.2 oz (106.2 kg)  12/25/23 231 lb (104.8  kg)  12/16/23 236 lb (107 kg)     General: Well developed, in no acute distress.  Neck: No JVD.  Cardiac: Normal rate, regular rhythm.  Resp: Normal work of breathing.  Ext: No edema.  Neuro: No gross focal deficits.  Psych: Normal affect.    ASSESSMENT AND PLAN:    #Ventricular tachycardia:  #High risk medication use: Amiodarone . LFTs 01/2024 and 12/2023. - Appears to be well controlled  on amiodarone . Continue amiodarone  200mg  daily for now. - If patient is felt to have a definitive, non-reversible NICM then he would meet criteria for ICD in setting of having had sustained VT. At this time, we cannot implant ICD given ongoing prosthetic joint infection. He is planning to have surgery for this in near future. Once joint infection has been adequately treated, then we will readdress ICD implantation.  #Cardiomyopathy:Unclear etiology. LHC with nonobstructive CAD.  Cardiac MRI diffuse yet subtle late gadolinium enhancement of the subendocardium and mid-myocardium. Workup for cardiac amyloidosis was negative; negative PYP scan and negative myeloma studies.  Cardiac sarcoidosis could also look like this, though prior biopsy of lymph node and skin rash biopsy x 2 did not show sarcoidosis, and CT chest was not consistent with pulmonary sarcoidosis.  ACE level was normal. There is question of a variant of hypertrophic cardiomyopathy.  - He is well compensated on exam today.  - Follow up with Dr. Rolan. Dedicated sarcoid PET is being considered.  - I will seek Dr. Orvilla opinion regarding need for ICD therapy.   Follow up with Dr. Kennyth in 3 months  Signed, Fonda Kennyth, MD  "

## 2024-01-27 ENCOUNTER — Telehealth: Payer: Self-pay

## 2024-01-27 ENCOUNTER — Encounter: Payer: Self-pay | Admitting: Cardiology

## 2024-01-27 ENCOUNTER — Ambulatory Visit: Attending: Cardiology | Admitting: Cardiology

## 2024-01-27 ENCOUNTER — Ambulatory Visit: Admission: RE | Admit: 2024-01-27 | Discharge: 2024-01-27 | Disposition: A | Source: Ambulatory Visit

## 2024-01-27 VITALS — BP 136/84 | HR 68 | Ht 72.0 in | Wt 234.2 lb

## 2024-01-27 DIAGNOSIS — I428 Other cardiomyopathies: Secondary | ICD-10-CM | POA: Insufficient documentation

## 2024-01-27 DIAGNOSIS — I472 Ventricular tachycardia, unspecified: Secondary | ICD-10-CM | POA: Insufficient documentation

## 2024-01-27 DIAGNOSIS — N1832 Chronic kidney disease, stage 3b: Secondary | ICD-10-CM

## 2024-01-27 NOTE — Telephone Encounter (Signed)
 Copied from CRM [id number]. Topic: Appointments - Appointment Scheduling >> Jan 27, 2024 10:30 AM Delon DASEN wrote: Patient needs surgical clearance ASAP for shoulder surgery due to new fracture- [phone]

## 2024-01-27 NOTE — Patient Instructions (Addendum)
 Medication Instructions:  Your physician recommends that you continue on your current medications as directed. Please refer to the Current Medication list given to you today.  *If you need a refill on your cardiac medications before your next appointment, please call your pharmacy*  Follow-Up: At [facility], you and your health needs are our priority.  As part of our continuing mission to provide you with exceptional heart care, our providers are all part of one team.  This team includes your primary Cardiologist (physician) and Advanced Practice Providers or APPs (Physician Assistants and Nurse Practitioners) who all work together to provide you with the care you need, when you need it.  Your next appointment:   3 months   Provider:   Ardeen Kohler, MD

## 2024-01-30 ENCOUNTER — Ambulatory Visit: Payer: Self-pay | Admitting: Gastroenterology

## 2024-01-30 DIAGNOSIS — D509 Iron deficiency anemia, unspecified: Secondary | ICD-10-CM

## 2024-01-30 NOTE — Telephone Encounter (Signed)
 Unsure about the fracture.  I do not see any paperwork.  Does he need to be seen in the office?  What day is the surgery planned for?  Thanks

## 2024-01-30 NOTE — Telephone Encounter (Signed)
 Tried to reach pt as PCP is wanting to know Unsure about the fracture.  I do not see any paperwork.  Does he need to be seen in the office?  What day is the surgery planned for?  Thanks

## 2024-02-03 ENCOUNTER — Telehealth (HOSPITAL_COMMUNITY): Payer: Self-pay

## 2024-02-03 ENCOUNTER — Ambulatory Visit: Payer: Self-pay | Admitting: Infectious Disease

## 2024-02-03 NOTE — Telephone Encounter (Signed)
 Called to confirm/remind patient of their appointment at the [facility] on 02/04/24 2:00   Appointment:   [x] Confirmed  [] Left mess   [] No answer/No voice mail  [] VM Full/unable to leave message  [] Phone not in service  Patient reminded to bring all medications and/or complete list.  Confirmed patient has transportation. Gave directions, instructed to utilize valet parking.

## 2024-02-04 ENCOUNTER — Encounter (HOSPITAL_COMMUNITY): Payer: Self-pay

## 2024-02-04 ENCOUNTER — Ambulatory Visit (HOSPITAL_COMMUNITY)
Admission: RE | Admit: 2024-02-04 | Discharge: 2024-02-04 | Disposition: A | Discharge status: Home / Self Care | Source: Ambulatory Visit | Attending: Cardiology | Admitting: Cardiology

## 2024-02-04 VITALS — BP 120/68 | HR 62 | Wt 234.6 lb

## 2024-02-04 DIAGNOSIS — I7121 Aneurysm of the ascending aorta, without rupture: Secondary | ICD-10-CM | POA: Diagnosis not present

## 2024-02-04 DIAGNOSIS — N183 Chronic kidney disease, stage 3 unspecified: Secondary | ICD-10-CM | POA: Diagnosis not present

## 2024-02-04 DIAGNOSIS — Z79899 Other long term (current) drug therapy: Secondary | ICD-10-CM | POA: Insufficient documentation

## 2024-02-04 DIAGNOSIS — Z7982 Long term (current) use of aspirin: Secondary | ICD-10-CM | POA: Diagnosis not present

## 2024-02-04 DIAGNOSIS — I472 Ventricular tachycardia, unspecified: Secondary | ICD-10-CM | POA: Diagnosis not present

## 2024-02-04 DIAGNOSIS — T8459XD Infection and inflammatory reaction due to other internal joint prosthesis, subsequent encounter: Secondary | ICD-10-CM | POA: Insufficient documentation

## 2024-02-04 DIAGNOSIS — Z96611 Presence of right artificial shoulder joint: Secondary | ICD-10-CM | POA: Diagnosis not present

## 2024-02-04 DIAGNOSIS — Z01818 Encounter for other preprocedural examination: Secondary | ICD-10-CM | POA: Diagnosis present

## 2024-02-04 DIAGNOSIS — D763 Other histiocytosis syndromes: Secondary | ICD-10-CM | POA: Diagnosis not present

## 2024-02-04 DIAGNOSIS — I2510 Atherosclerotic heart disease of native coronary artery without angina pectoris: Secondary | ICD-10-CM | POA: Insufficient documentation

## 2024-02-04 DIAGNOSIS — I429 Cardiomyopathy, unspecified: Secondary | ICD-10-CM | POA: Insufficient documentation

## 2024-02-04 DIAGNOSIS — R238 Other skin changes: Secondary | ICD-10-CM | POA: Diagnosis not present

## 2024-02-04 DIAGNOSIS — Z7985 Long-term (current) use of injectable non-insulin antidiabetic drugs: Secondary | ICD-10-CM | POA: Diagnosis not present

## 2024-02-04 DIAGNOSIS — I428 Other cardiomyopathies: Secondary | ICD-10-CM | POA: Diagnosis not present

## 2024-02-04 DIAGNOSIS — Z01810 Encounter for preprocedural cardiovascular examination: Secondary | ICD-10-CM | POA: Insufficient documentation

## 2024-02-04 DIAGNOSIS — R21 Rash and other nonspecific skin eruption: Secondary | ICD-10-CM | POA: Diagnosis present

## 2024-02-04 NOTE — Progress Notes (Signed)
 "  [facility] PROGRESS NOTE  Primary Care: Plotnikov, Karlynn GAILS, MD Primary Cardiologist: Jerel Balding, MD HF Cardiologist: Dr. Rolan  Chief complaint: Cardiomyopathy  HPI: 74 y.o. male with history of chest wall rash, right prosthetic shoulder joint infection, CKD stabe 3, ascending aortic aneurysm.   In 2025, patient developed a bullous chest wall rash.  Skin biopsy and axillary lymph node biopsy at [facility] were consistent with intravascular histiocytosis, no evidence for lymphoma on the lymph node biopsy.  This can be primary or secondary to rheumatoid arthritis or metallic prosthesis.  Patient has a prosthetic shoulder joint.  This is a rare condition, but can be treated with immunosuppression. He has been on methotrexate + hydroxychloroquine initially, now on mycophenolate  mofetil.    Due to lightheadedness and exertional dyspnea, he underwent a stress echo in 10/25. One minute into stage I of Bruce protocol, he developed symptomatic sustained VT which terminated spontaneously.  BP stable. He was short of breath.  He was sent to the ED for urgent evaluation. He was admitted under Cardiology service for further workup. Cardiac cath with 50% proximal LAD (not significant by RFR, 0.91), 50-60% ostial ramus.  EP consulted. He was started on diltiazem  CD and amiodarone . High suspicion for acute on chronic inflammatory process.  Cardiac MRI showed LVEF 61%, moderate to severe LVH (septum 2.0 cm thick) without systolic anterior motion, RV function okay, diffuse patchy LGE subendocardial and mid-myocardial LGE from base to apex, ECV 40%. Study suggestive of cardiac amyloidosis vs sarcoidosis. He underwent PYP scan which showed no evidence of cardiac amyloidosis, myeloma workup negative.  Repeat skin biopsy of the chest was done, showing chronic granulomatous dermatitis, not typical for sarcoidosis.  Given suspicion for inflammatory cardiomyopathy, prednisone  was begun. Had repeat skin biopsy again  12/25 demonstrating benign lichenoid keratosis with adjacent residual seborrheic keratosis.   At last clinic visit 12/25, Dr. Rolan tapered his prednisone , decreasing to 20 mg daily x 2 wks then 10 mg daily x 2 wks then 5 mg daily x 2 wks then stop.  Pt was instructed to stop Bactrim  prophylaxis once prednisone  was down to 5 mg daily. 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 He has been referred to Dr. Thera. Initial consultation scheduled for next month.   He returns today for followup of cardiomyopathy and VT.  Doing well. Now off prednisone  but still taking Bactrim . Stays active, works out regularly. Gets ~6,000-10,000 steps in a day. No exertional CP or significant dyspnea. NYHA Class I-II. Monitors his HR on his Apple Watch. Resting HR within normal limits. No palpitations, syncope/ near syncope.   He is needing to undergo repeat shoulder surgery for revision and needs cardiac clearance.    ECG: not performed today    Labs (10/25): K 4.8, creatinine 1.75, SPEP and urine immunofixation without M spike, LDL 73, ACE level normal, alpha galactosidase level 123 (normal).  Labs (12/25): creatinine 2.01, K 4.8, BNP 229, TSH nl, HFTs nl, Genetic Test uncertain variant of TTN gene  Labs (1/26):   creatinine 1.75, K 4.7, Hgb 13.6  PMH: 1. CKD stage 3 2. 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  3. VT: 10/25, noted on stress echo.  4. Bullous chest wall rash:  Skin biopsy and axillary lymph node biopsy in 4/25 at [facility] were consistent with intravascular histiocytosis.  This can be primary or secondary to rheumatoid arthritis or metallic prosthesis.  Patient  has a prosthetic shoulder joint.  This is a rare condition, but can be treated with immunosuppression.  - He has been on methotrexate + hydroxychloroquine initially, now on mycophenolate  mofetil.  - Repeat chest wall biopsy at [facility] showed  chronic granulomatous dermatitis, not typical for sarcoidosis.  5. Cardiomyopathy: Cardiac MRI (10/25) showed LVEF 61%, moderate to severe LVH (septum 2.0 cm) w/o SAM, RV function okay, diffuse patchy LGE subendocardial and mid-myocardial LGE from base to apex, ECV 40%. Study suggestive of cardiac amyloidosis vs sarcoidosis.  - Zio (9/25): mostly NSR, 5 runs of NSVT, no AF, 4.2% PVC burden, symptom trigger episodes correspond to NSR with PVCs - PYP scan (10/25): no evidence for cardiac amyloidosis; Myeloma panel and urine immunofixation with no monoclonal light chain.  - LHC (10/25): mild to moderate non-obstructive CAD; 50% pLAD, 50-60% ostial ramus - Echo (10/25): EF 65-70%, G1DD, normal RV, IVC not dilated - Alpha galactosidase level normal, ACE level normal.  6. CAD: LHC in 10/25 showed mild to moderate non-obstructive CAD; 50% pLAD, 50-60% ostial ramus.    Current Outpatient Medications  Medication Sig Dispense Refill   amiodarone  (PACERONE ) 200 MG tablet Take 1 tablet (200 mg total) by mouth daily. 90 tablet 3   aspirin  EC 81 MG tablet Take 1 tablet (81 mg total) by mouth daily. Swallow whole. 30 tablet 12   atorvastatin  (LIPITOR) 40 MG tablet Take 1 tablet (40 mg total) by mouth daily. 90 tablet 3   B Complex-C-Folic Acid  (B COMPLEX-VITAMIN C-FOLIC ACID ) 1 MG tablet Take 1 tablet by mouth daily with breakfast. 100 tablet 3   Cholecalciferol  (VITAMIN D3) 50 MCG (2000 UT) capsule Take 2 capsules (4,000 Units total) by mouth daily. 100 capsule 3   diltiazem  (CARDIZEM  CD) 120 MG 24 hr capsule Take 1 capsule (120 mg total) by mouth every 12 (twelve) hours. 90 capsule 3   ferrous sulfate  325 (65 FE) MG tablet Take 325 mg by mouth daily with breakfast.     folic acid  (FOLVITE ) 1 MG tablet Take 1 mg by mouth daily.     Melatonin 10-10 MG TBCR Take 10 mg by mouth at bedtime as needed (sleep issues). Bedtime     Minoxidil (ROGAINE MENS) 5 % FOAM Apply 1 Application topically 4 (four) times a week.      Multiple Vitamin (MULTI-VITAMIN) tablet Take 1 tablet by mouth daily. [employer] Men's 50+     mycophenolate  (CELLCEPT ) 500 MG tablet Take 500 mg by mouth 2 (two) times daily.     nitroGLYCERIN  (NITROSTAT ) 0.4 MG SL tablet Place 1 tablet (0.4 mg total) under the tongue every 5 (five) minutes x 3 doses as needed for chest pain. 25 tablet 3   omeprazole  (PRILOSEC) 40 MG capsule Take 40 mg by mouth daily.     tamsulosin  (FLOMAX ) 0.4 MG CAPS capsule Take 0.4 mg by mouth daily after breakfast.     testosterone  cypionate (DEPOTESTOSTERONE CYPIONATE) 200 MG/ML injection Inject 1 mL (200 mg total) into the muscle every 14 (fourteen) days. 10 mL 1   tirzepatide  (ZEPBOUND ) 2.5 MG/0.5ML injection vial Inject 2.5 mg into the skin once a week. 2 mL 5   famotidine  (PEPCID ) 20 MG tablet Take 20 mg by mouth at bedtime. (Patient not taking: Reported on 02/04/2024)     No current facility-administered medications for this encounter.   Allergies  Allergen Reactions   Amoxicillin  Hives   Penicillins Itching, Rash, Dermatitis and Hives    Other reaction(s): Hives/Swelling-Allergy Childhood reaction. Tolerates  cephalosporins.     Childhood reaction. Tolerates cephalosporins.   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    Other reaction(s): Other- (not listed) - Allergy [place]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  Severe redness all over body with swollen ankles and hands [place]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   Family History  Problem Relation Age of Onset   Alzheimer's disease Mother    Arthritis Father    Diabetes Brother    Hyperlipidemia Other    Colon cancer Neg Hx    Colon polyps Neg Hx    Esophageal cancer Neg Hx    Stomach cancer Neg Hx    Rectal cancer Neg Hx    ROS: All systems reviewed and negative except as per HPI.   Wt Readings from Last 3 Encounters:  02/04/24 106.4 kg (234 lb 9.6 oz)  01/27/24 106.2 kg (234 lb 3.2 oz)  12/25/23 104.8 kg (231 lb)   BP 120/68   Pulse 62   Wt 106.4 kg (234 lb 9.6 oz)   SpO2 94%   BMI 31.82 kg/m   PHYSICAL EXAM: General:  Well appearing. No respiratory difficulty HEENT: normal Neck: supple. no JVD. Carotids 2+ bilat; no bruits. No lymphadenopathy or thyromegaly appreciated. Cor: PMI nondisplaced. Regular rate & rhythm. No rubs, gallops or murmurs. Lungs: clear Abdomen: soft, nontender, nondistended. No hepatosplenomegaly. No bruits or masses. Good bowel sounds. Extremities: no cyanosis, clubbing, rash, edema Neuro: alert & oriented x 3, cranial nerves grossly intact. moves all 4 extremities w/o difficulty. Affect pleasant.   ASSESSMENT & PLAN: 1. Chest wall rash: Bullous rash.  Skin biopsy and axillary lymph node biopsy at [facility] were consistent with intravascular histiocytosis.  This can be  primary or secondary to rheumatoid arthritis or metallic prosthesis.  Patient has a prosthetic shoulder joint.  This is a rare condition, but can be treated with immunosuppression. He has been on methotrexate + hydroxychloroquine initially, now on mycophenolate  mofetil. ESR elevated at 48, CRP elevated at 11.  ANA, RF, anti-CCP ab all checked at [facility] in the past and negative.  Based on past workup, this does not seem to be a sarcoidosis-type skin lesion. Patient had CTA chest with no hilar or mediastinal lymphadenopathy and no lung parenchymal changes consistent with sarcoidosis.  Repeat skin biopsy in 10/25 showed chronic granulomatous dermatitis, not typical for sarcoidosis. Chest wall rash significantly improved on prednisone .  Had repeat skin biopsy again 12/25 demonstrating benign lichenoid keratosis with adjacent residual seborrheic keratosis.  He has completed course of prednisone . Rash improved.  - Continue mycophenolate  mofetil, followed dermatology at [facility]   2. Cardiomyopathy: Lightheadedness with exertion in 10/25, found to have VT on stress echo and then PVCs with exertion.  LHC with nonobstructive CAD.  I reviewed the cardiac MRI, this showed LVEF 61% with moderate LVH (somewhat asymmetric in septum), normal RV size/systolic function, there was diffuse and somewhat subtle patchy mid-wall and subendocardial LGE from base=>apex, there was more prominent mid-wall LGE in the basal inferolateral wall, ECV 40%.  Unfortunately, differential is broad here.  Cardiac amyloidosis was a consideration with the LVH, diffuse and somewhat subtle LGE and elevated ECV, but this type of VT presentation is unusual and he does not have any other signs (peripheral neuropathy, CTS, etc). Workup for cardiac amyloidosis was negative; negative PYP scan and negative myeloma studies.  Cardiac sarcoidosis could also look like this, though prior biopsy of lymph node and skin rash biopsy x 2 did not show sarcoidosis, and CT chest  was not consistent with pulmonary sarcoidosis.  ACE level was normal.  He had been treated with immunosuppression that could be used to treat cardiac sarcoidosis for several months (methotrexate then mycophenolate  mofetil) but still had VT. Fabry's was a consideration but alpha galactosidase level was normal.  LGE pattern was not typical for a familial arrhythmogenic cardiomyopathy and no family history.  Hypertrophic cardiomyopathy is a definite consideration though he does not carry a family history (somewhat asymmetric LVH and LGE).  If the skin rash is from intravascular histiocytosis, we do not have a definite way to connect this to the cardiomyopathy.  At this point, I think that his cardiomyopathy is separate from his skin rash (?hypertrophic cardiomyopathy). 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 He has been referred to Dr. Fairy, appt scheduled 2/23. Currently, he is doing well w/ stable NYHA Class I-II symptoms. Euvolemic on exam. He has completed course of prednisone .  - he can stop Bactrim  now that he is off of prednisone   - continue mycophenolate   - He was encouraged to keep consultation w/ Dr. Fairy  - Hold off on cardiac PET for inflammatory evaluation for now as  his is going to be confounded by the fact that he has been on prednisone  and mycophenolate  mofetil. - He does not need loop diuretic  3. VT: Has had VT and PVCs with exertion.  No recent ventricular arrhythmias. He monitors his HR w/ his Apple Watch and has remained stable. No palpitations, syncope/ near syncope.  - Continue amiodarone  and diltiazem  CD per EP. TSH and HFTs checked last month and normal. He will need a regular eye exams.  4. CAD: Nonobstructive on cath. Denies CP  - Continue statin.  - Continue ASA 81 daily.  5. CKD stage 3:  - SCr stable on recent BMP, personally reviewed  6. Surgical Clearance: Pt needing to undergo rt shoulder revision. Recent cath  showed nonobstructive CAD, Echo w/ normal EF and no significant valvular dysfunction. He is able to complete >4 METS of physical activity w/o exertional CP or dyspnea. He is now off of prednisone . Can proceed w/ surgery. No indication for further cardiac/ischemic testing.  - continue statin during the perioperative period -  ok to hold ASA if needed    F/u w/ Dr. Rolan in 3 months   Caffie Shed, PA-C  02/04/24  "

## 2024-02-04 NOTE — Patient Instructions (Signed)
 Medication Changes:  STOP BACTRIM    Follow-Up in: 3 MONTHS WITH DR. ROLAN AS SCHEDULED   At the [facility], you and your health needs are our priority. We have a designated team specialized in the treatment of Heart Failure. This Care Team includes your primary Heart Failure Specialized Cardiologist (physician), Advanced Practice Providers (APPs- Physician Assistants and Nurse Practitioners), and Pharmacist who all work together to provide you with the care you need, when you need it.   You may see any of the following providers on your designated Care Team at your next follow up:  Dr. Toribio Fuel Dr. Ezra Rolan Dr. Odis Brownie Greig Mosses, NP Caffie Shed, [place] [facility] [place], [place] Beckey Coe, NP Jordan Lee, NP Tinnie Redman, PharmD   Please be sure to bring in all your medications bottles to every appointment.   Need to Contact Us :  If you have any questions or concerns before your next appointment please send us  a message through [employer] or call our office at [phone].    TO LEAVE A MESSAGE FOR THE NURSE SELECT OPTION 2, PLEASE LEAVE A MESSAGE INCLUDING: YOUR NAME DATE OF BIRTH CALL BACK NUMBER REASON FOR CALL**this is important as we prioritize the call backs  YOU WILL RECEIVE A CALL BACK THE SAME DAY AS LONG AS YOU CALL BEFORE 4:00 PM

## 2024-02-12 ENCOUNTER — Ambulatory Visit (HOSPITAL_COMMUNITY): Admitting: Cardiology

## 2024-02-16 ENCOUNTER — Ambulatory Visit: Admitting: Infectious Disease

## 2024-03-01 ENCOUNTER — Ambulatory Visit: Admitting: Genetic Counselor

## 2024-03-15 ENCOUNTER — Ambulatory Visit: Payer: Self-pay | Admitting: Infectious Disease

## 2024-03-15 ENCOUNTER — Ambulatory Visit: Admitting: Infectious Disease

## 2024-05-07 ENCOUNTER — Ambulatory Visit (HOSPITAL_COMMUNITY): Admitting: Cardiology

## 2024-05-10 ENCOUNTER — Encounter: Admitting: Internal Medicine

## 2024-05-13 ENCOUNTER — Ambulatory Visit: Admitting: Cardiology

## 2024-08-26 ENCOUNTER — Ambulatory Visit
